# Patient Record
Sex: Male | Born: 1960 | Race: White | Hispanic: No | Marital: Married | Smoking: Never smoker
Health system: Southern US, Community
[De-identification: ages and names within clinical notes are randomized; demographics above are authoritative.]

## PROBLEM LIST (undated history)

## (undated) DIAGNOSIS — D509 Iron deficiency anemia, unspecified: Secondary | ICD-10-CM

## (undated) DIAGNOSIS — R002 Palpitations: Secondary | ICD-10-CM

## (undated) DIAGNOSIS — C61 Malignant neoplasm of prostate: Secondary | ICD-10-CM

## (undated) DIAGNOSIS — Z8489 Family history of other specified conditions: Secondary | ICD-10-CM

## (undated) DIAGNOSIS — N200 Calculus of kidney: Secondary | ICD-10-CM

## (undated) DIAGNOSIS — E119 Type 2 diabetes mellitus without complications: Secondary | ICD-10-CM

## (undated) DIAGNOSIS — A938 Other specified arthropod-borne viral fevers: Secondary | ICD-10-CM

## (undated) DIAGNOSIS — R350 Frequency of micturition: Secondary | ICD-10-CM

## (undated) DIAGNOSIS — J3089 Other allergic rhinitis: Secondary | ICD-10-CM

## (undated) DIAGNOSIS — H9190 Unspecified hearing loss, unspecified ear: Secondary | ICD-10-CM

## (undated) DIAGNOSIS — G4730 Sleep apnea, unspecified: Secondary | ICD-10-CM

## (undated) DIAGNOSIS — N400 Enlarged prostate without lower urinary tract symptoms: Secondary | ICD-10-CM

## (undated) DIAGNOSIS — M79604 Pain in right leg: Secondary | ICD-10-CM

## (undated) DIAGNOSIS — M51369 Other intervertebral disc degeneration, lumbar region without mention of lumbar back pain or lower extremity pain: Secondary | ICD-10-CM

## (undated) DIAGNOSIS — K219 Gastro-esophageal reflux disease without esophagitis: Secondary | ICD-10-CM

## (undated) DIAGNOSIS — G4733 Obstructive sleep apnea (adult) (pediatric): Secondary | ICD-10-CM

## (undated) DIAGNOSIS — F419 Anxiety disorder, unspecified: Secondary | ICD-10-CM

## (undated) DIAGNOSIS — K429 Umbilical hernia without obstruction or gangrene: Secondary | ICD-10-CM

## (undated) DIAGNOSIS — I499 Cardiac arrhythmia, unspecified: Secondary | ICD-10-CM

## (undated) DIAGNOSIS — M48062 Spinal stenosis, lumbar region with neurogenic claudication: Secondary | ICD-10-CM

## (undated) DIAGNOSIS — M542 Cervicalgia: Secondary | ICD-10-CM

## (undated) DIAGNOSIS — M5030 Other cervical disc degeneration, unspecified cervical region: Secondary | ICD-10-CM

## (undated) DIAGNOSIS — R0600 Dyspnea, unspecified: Secondary | ICD-10-CM

## (undated) DIAGNOSIS — K4090 Unilateral inguinal hernia, without obstruction or gangrene, not specified as recurrent: Secondary | ICD-10-CM

## (undated) DIAGNOSIS — Z87438 Personal history of other diseases of 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 organs: Secondary | ICD-10-CM

## (undated) DIAGNOSIS — M069 Rheumatoid arthritis, unspecified: Secondary | ICD-10-CM

## (undated) DIAGNOSIS — E669 Obesity, unspecified: Secondary | ICD-10-CM

## (undated) DIAGNOSIS — M1990 Unspecified osteoarthritis, unspecified site: Secondary | ICD-10-CM

## (undated) DIAGNOSIS — Z87442 Personal history of urinary calculi: Secondary | ICD-10-CM

## (undated) DIAGNOSIS — D569 Thalassemia, unspecified: Secondary | ICD-10-CM

## (undated) DIAGNOSIS — N475 Adhesions of prepuce and glans penis: Secondary | ICD-10-CM

## (undated) DIAGNOSIS — I10 Essential (primary) hypertension: Secondary | ICD-10-CM

## (undated) DIAGNOSIS — E785 Hyperlipidemia, unspecified: Secondary | ICD-10-CM

## (undated) DIAGNOSIS — G8929 Other chronic pain: Secondary | ICD-10-CM

## (undated) DIAGNOSIS — I2510 Atherosclerotic heart disease of native coronary artery without angina pectoris: Secondary | ICD-10-CM

## (undated) DIAGNOSIS — D649 Anemia, unspecified: Secondary | ICD-10-CM

## (undated) DIAGNOSIS — Z22322 Carrier or suspected carrier of Methicillin resistant Staphylococcus aureus: Secondary | ICD-10-CM

## (undated) DIAGNOSIS — J189 Pneumonia, unspecified organism: Secondary | ICD-10-CM

## (undated) DIAGNOSIS — F909 Attention-deficit hyperactivity disorder, unspecified type: Secondary | ICD-10-CM

## (undated) HISTORY — PX: ROTATOR CUFF REPAIR: SHX139

## (undated) HISTORY — PX: HYDROCELE EXCISION / REPAIR: SUR1145

## (undated) HISTORY — PX: LEG SURGERY: SHX1003

## (undated) HISTORY — PX: LITHOTRIPSY: SUR834

## (undated) HISTORY — DX: Calculus of kidney: N20.0

## (undated) HISTORY — PX: VASECTOMY: SHX75

## (undated) HISTORY — PX: BACK SURGERY: SHX140

## (undated) HISTORY — PX: TONSILLECTOMY: SUR1361

## (undated) HISTORY — PX: HERNIA REPAIR: SHX51

## (undated) HISTORY — PX: COLONOSCOPY: SHX174

## (undated) HISTORY — PX: INGUINAL HERNIA REPAIR: SUR1180

## (undated) HISTORY — DX: Thalassemia, unspecified: D56.9

## (undated) HISTORY — PX: OTHER SURGICAL HISTORY: SHX169

---

## 2005-01-04 ENCOUNTER — Ambulatory Visit: Payer: Self-pay | Admitting: Urology

## 2006-03-24 ENCOUNTER — Other Ambulatory Visit: Payer: Self-pay

## 2006-03-25 ENCOUNTER — Observation Stay: Payer: Self-pay | Admitting: Internal Medicine

## 2006-03-26 ENCOUNTER — Other Ambulatory Visit: Payer: Self-pay

## 2008-09-23 ENCOUNTER — Ambulatory Visit: Payer: Self-pay | Admitting: Family Medicine

## 2009-02-05 ENCOUNTER — Ambulatory Visit: Payer: Self-pay | Admitting: Family Medicine

## 2009-04-06 DIAGNOSIS — K4090 Unilateral inguinal hernia, without obstruction or gangrene, not specified as recurrent: Secondary | ICD-10-CM | POA: Insufficient documentation

## 2009-04-21 ENCOUNTER — Ambulatory Visit: Payer: Self-pay | Admitting: Surgery

## 2009-11-23 ENCOUNTER — Ambulatory Visit: Payer: Self-pay | Admitting: Urology

## 2010-07-14 ENCOUNTER — Ambulatory Visit: Payer: Self-pay | Admitting: Urology

## 2011-03-04 ENCOUNTER — Ambulatory Visit: Payer: Self-pay | Admitting: Urology

## 2011-03-09 ENCOUNTER — Ambulatory Visit: Payer: Self-pay | Admitting: Urology

## 2011-07-14 ENCOUNTER — Ambulatory Visit: Payer: Self-pay | Admitting: Specialist

## 2011-08-03 ENCOUNTER — Ambulatory Visit: Payer: Self-pay | Admitting: Specialist

## 2011-08-18 ENCOUNTER — Ambulatory Visit: Payer: Self-pay | Admitting: Specialist

## 2011-09-21 ENCOUNTER — Ambulatory Visit: Payer: Self-pay | Admitting: Urology

## 2011-09-22 LAB — PATHOLOGY REPORT

## 2011-10-04 ENCOUNTER — Encounter: Payer: Self-pay | Admitting: Specialist

## 2011-10-13 ENCOUNTER — Encounter: Payer: Self-pay | Admitting: Specialist

## 2011-11-12 ENCOUNTER — Encounter: Payer: Self-pay | Admitting: Specialist

## 2011-12-13 ENCOUNTER — Encounter: Payer: Self-pay | Admitting: Specialist

## 2012-01-13 ENCOUNTER — Encounter: Payer: Self-pay | Admitting: Specialist

## 2013-06-11 ENCOUNTER — Other Ambulatory Visit: Payer: Self-pay | Admitting: Specialist

## 2013-06-15 LAB — MISC AER/ANAEROBIC CULT.

## 2013-07-17 ENCOUNTER — Ambulatory Visit: Payer: Self-pay | Admitting: Family Medicine

## 2014-05-16 LAB — PSA: PSA: 3.2

## 2014-05-16 LAB — LIPID PANEL
Cholesterol: 134 mg/dL (ref 0–200)
HDL: 28 mg/dL — AB (ref 35–70)
LDL Cholesterol: 83 mg/dL
LDl/HDL Ratio: 3
Triglycerides: 114 mg/dL (ref 40–160)

## 2014-07-14 LAB — HEPATIC FUNCTION PANEL
ALT: 20 U/L (ref 10–40)
AST: 18 U/L (ref 14–40)
Alkaline Phosphatase: 66 U/L (ref 25–125)
Bilirubin, Total: 0.6 mg/dL

## 2014-07-14 LAB — BASIC METABOLIC PANEL
BUN: 13 mg/dL (ref 4–21)
Creatinine: 0.8 mg/dL (ref 0.6–1.3)
Glucose: 101 mg/dL
Sodium: 140 mmol/L (ref 137–147)

## 2014-07-14 LAB — TSH: TSH: 1.55 u[IU]/mL (ref 0.41–5.90)

## 2014-07-18 ENCOUNTER — Ambulatory Visit: Payer: Self-pay | Admitting: Family Medicine

## 2014-07-29 ENCOUNTER — Ambulatory Visit: Payer: Self-pay | Admitting: Family Medicine

## 2014-08-05 LAB — CBC AND DIFFERENTIAL
Neutrophils Absolute: 2 /uL
WBC: 3.9 10^3/mL

## 2014-08-11 ENCOUNTER — Emergency Department: Payer: Self-pay | Admitting: Emergency Medicine

## 2014-08-11 LAB — COMPREHENSIVE METABOLIC PANEL
Albumin: 4 g/dL (ref 3.4–5.0)
Alkaline Phosphatase: 55 U/L
Anion Gap: 6 — ABNORMAL LOW (ref 7–16)
BUN: 11 mg/dL (ref 7–18)
Bilirubin,Total: 0.6 mg/dL (ref 0.2–1.0)
Calcium, Total: 8.5 mg/dL (ref 8.5–10.1)
Chloride: 104 mmol/L (ref 98–107)
Co2: 28 mmol/L (ref 21–32)
Creatinine: 1.01 mg/dL (ref 0.60–1.30)
EGFR (African American): >60
EGFR (Non-African Amer.): >60
Glucose: 104 mg/dL — ABNORMAL HIGH (ref 65–99)
Osmolality: 275 (ref 275–301)
Potassium: 4.1 mmol/L (ref 3.5–5.1)
SGOT(AST): 16 U/L (ref 15–37)
SGPT (ALT): 28 U/L
Sodium: 138 mmol/L (ref 136–145)
Total Protein: 7.7 g/dL (ref 6.4–8.2)

## 2014-08-11 LAB — CSF CELL COUNT WITH DIFFERENTIAL
CSF Tube #: 2
Eosinophil: 7 %
Lymphocytes: 20 %
Monocytes/Macrophages: 13 %
Neutrophils: 60 %
Other Cells: 0 %
RBC (CSF): 2275 /mm3
WBC (CSF): 8 /mm3

## 2014-08-11 LAB — CSF CELL CT + PROT + GLU PANEL
CSF Tube #: 1
Eosinophil: 0 %
Glucose, CSF: 60 mg/dL (ref 40–75)
Lymphocytes: 15 %
Monocytes/Macrophages: 13 %
Neutrophils: 72 %
Other Cells: 0 %
Protein, CSF: 70 mg/dL — ABNORMAL HIGH (ref 15–45)
RBC (CSF): 11222 /mm3
WBC (CSF): 10 /mm3

## 2014-08-11 LAB — CBC
HCT: 35.7 % — ABNORMAL LOW (ref 40.0–52.0)
HGB: 11.4 g/dL — ABNORMAL LOW (ref 13.0–18.0)
MCH: 19.4 pg — ABNORMAL LOW (ref 26.0–34.0)
MCHC: 32 g/dL (ref 32.0–36.0)
MCV: 61 fL — ABNORMAL LOW (ref 80–100)
Platelet: 141 10*3/uL — ABNORMAL LOW (ref 150–440)
RBC: 5.89 10*6/uL (ref 4.40–5.90)
RDW: 16.3 % — ABNORMAL HIGH (ref 11.5–14.5)
WBC: 4.2 10*3/uL (ref 3.8–10.6)

## 2014-08-11 LAB — URINALYSIS, COMPLETE
Bacteria: NONE SEEN
Bilirubin,UR: NEGATIVE
Blood: NEGATIVE
Glucose,UR: NEGATIVE mg/dL (ref 0–75)
Ketone: NEGATIVE
Leukocyte Esterase: NEGATIVE
Nitrite: NEGATIVE
Ph: 7 (ref 4.5–8.0)
Protein: NEGATIVE
RBC,UR: <1 /HPF (ref 0–5)
Specific Gravity: 1.006 (ref 1.003–1.030)
Squamous Epithelial: NONE SEEN
WBC UR: NONE SEEN /HPF (ref 0–5)

## 2014-08-11 LAB — DIFFERENTIAL
Basophil #: 0 10*3/uL (ref 0.0–0.1)
Basophil %: 0.8 %
Eosinophil #: 0.2 10*3/uL (ref 0.0–0.7)
Eosinophil %: 4.5 %
Lymphocyte #: 1.3 10*3/uL (ref 1.0–3.6)
Lymphocyte %: 31.1 %
Monocyte #: 0.5 x10 3/mm (ref 0.2–1.0)
Monocyte %: 10.8 %
Neutrophil #: 2.2 10*3/uL (ref 1.4–6.5)
Neutrophil %: 52.8 %

## 2014-08-11 LAB — PROTIME-INR
INR: 1
Prothrombin Time: 12.8 secs (ref 11.5–14.7)

## 2014-08-11 LAB — TROPONIN I: Troponin-I: <0.02 ng/mL

## 2014-08-14 LAB — CSF CULTURE W GRAM STAIN

## 2014-09-01 LAB — CULTURE, FUNGUS WITHOUT SMEAR

## 2014-09-09 ENCOUNTER — Ambulatory Visit: Payer: Self-pay | Admitting: Family Medicine

## 2014-09-17 ENCOUNTER — Ambulatory Visit: Payer: Self-pay | Admitting: Family Medicine

## 2014-10-09 ENCOUNTER — Other Ambulatory Visit (HOSPITAL_COMMUNITY): Payer: Self-pay | Admitting: Neurosurgery

## 2014-11-04 ENCOUNTER — Encounter (HOSPITAL_COMMUNITY)
Admission: RE | Admit: 2014-11-04 | Discharge: 2014-11-04 | Disposition: A | Discharge status: Home / Self Care | Payer: BC Managed Care – PPO | Source: Ambulatory Visit | Attending: Neurosurgery | Admitting: Neurosurgery

## 2014-11-04 ENCOUNTER — Encounter (HOSPITAL_COMMUNITY): Payer: Self-pay

## 2014-11-04 DIAGNOSIS — Z01818 Encounter for other preprocedural examination: Secondary | ICD-10-CM | POA: Diagnosis present

## 2014-11-04 DIAGNOSIS — I10 Essential (primary) hypertension: Secondary | ICD-10-CM | POA: Diagnosis not present

## 2014-11-04 HISTORY — DX: Cardiac arrhythmia, unspecified: I49.9

## 2014-11-04 HISTORY — DX: Essential (primary) hypertension: I10

## 2014-11-04 HISTORY — DX: Cervicalgia: M54.2

## 2014-11-04 HISTORY — DX: Other allergic rhinitis: J30.89

## 2014-11-04 HISTORY — DX: Other chronic pain: G89.29

## 2014-11-04 HISTORY — DX: Personal history of urinary calculi: Z87.442

## 2014-11-04 HISTORY — DX: Pain in right leg: M79.604

## 2014-11-04 HISTORY — DX: Unspecified hearing loss, unspecified ear: H91.90

## 2014-11-04 HISTORY — DX: Frequency of micturition: R35.0

## 2014-11-04 HISTORY — DX: Other specified arthropod-borne viral fevers: A93.8

## 2014-11-04 HISTORY — DX: Benign prostatic hyperplasia without lower urinary tract symptoms: N40.0

## 2014-11-04 LAB — CBC
HCT: 36.8 % — ABNORMAL LOW (ref 39.0–52.0)
Hemoglobin: 11.6 g/dL — ABNORMAL LOW (ref 13.0–17.0)
MCH: 19.7 pg — ABNORMAL LOW (ref 26.0–34.0)
MCHC: 31.5 g/dL (ref 30.0–36.0)
MCV: 62.6 fL — ABNORMAL LOW (ref 78.0–100.0)
Platelets: 165 10*3/uL (ref 150–400)
RBC: 5.88 MIL/uL — ABNORMAL HIGH (ref 4.22–5.81)
RDW: 16.5 % — ABNORMAL HIGH (ref 11.5–15.5)
WBC: 4.1 10*3/uL (ref 4.0–10.5)

## 2014-11-04 LAB — BASIC METABOLIC PANEL
Anion gap: 11 (ref 5–15)
BUN: 12 mg/dL (ref 6–23)
CO2: 26 mEq/L (ref 19–32)
Calcium: 9 mg/dL (ref 8.4–10.5)
Chloride: 103 mEq/L (ref 96–112)
Creatinine, Ser: 0.81 mg/dL (ref 0.50–1.35)
GFR calc Af Amer: >90 mL/min (ref >90)
GFR calc non Af Amer: >90 mL/min (ref >90)
Glucose, Bld: 120 mg/dL — ABNORMAL HIGH (ref 70–99)
Potassium: 4.7 mEq/L (ref 3.7–5.3)
Sodium: 140 mEq/L (ref 137–147)

## 2014-11-04 LAB — SURGICAL PCR SCREEN
MRSA, PCR: NEGATIVE
Staphylococcus aureus: POSITIVE — AB

## 2014-11-04 NOTE — Pre-Procedure Instructions (Signed)
Justin Soto  11/04/2014   Your procedure is scheduled on:  Thursday November 13, 2014 at 11:10 AM.  Report to [facility] Admitting at 8:15 AM.  Call this number if you have problems the morning of surgery: [phone]   Call this number if you have any questions prior to surgery: [phone]   Remember:   Do not eat food or drink liquids after midnight.   Take these medicines the morning of surgery with A SIP OF WATER: Amlodipine (Lotrel), Cetirizine (Zyrtec), Montelukast (Singlulair), Afrin nasal spray if needed, Pantoprazole (Protonix), Pregabalin (Lyrica) and Silodosin (Rapaflo)   Stop taking any aspirin or herbal medications 7 days before your surgery 11/06/14 (Ex. Multivitamin, Naproxen, Advil, Motrin, etc)   Do not wear jewelry.  Do not wear lotions, powders, or cologne.   Men may shave face and neck.  Do not bring valuables to the hospital.  [facility] is not responsible for any belongings or valuables.               Contacts, dentures or bridgework may not be worn into surgery.  Leave suitcase in the car. After surgery it may be brought to your room.  For patients admitted to the hospital, discharge time is determined by your teatment team.               Patients discharged the day of surgery will not be allowed to drive home.  Name and phone number of your driver:   Special Instructions: Shower using CHG soap the night before and the morning of your surgery   Please read over the following fact sheets that you were given: Pain Booklet, Coughing and Deep Breathing, MRSA Information and Surgical Site Infection Prevention

## 2014-11-04 NOTE — Progress Notes (Signed)
Nurse called Mupirocin ointment in to [facility] and Nurse called and informed patient of positive PCR results.

## 2014-11-12 MED ORDER — CEFAZOLIN SODIUM-DEXTROSE 2-3 GM-% IV SOLR
2.0000 g | INTRAVENOUS | Status: AC
  Administered 2014-11-13: 2 g via INTRAVENOUS
  Filled 2014-11-12: qty 50

## 2014-11-13 ENCOUNTER — Ambulatory Visit (HOSPITAL_COMMUNITY): Payer: BC Managed Care – PPO | Admitting: Anesthesiology

## 2014-11-13 ENCOUNTER — Ambulatory Visit (HOSPITAL_COMMUNITY): Payer: BC Managed Care – PPO

## 2014-11-13 ENCOUNTER — Observation Stay (HOSPITAL_COMMUNITY)
Admission: RE | Admit: 2014-11-13 | Discharge: 2014-11-14 | Disposition: A | Discharge status: Home / Self Care | Payer: BC Managed Care – PPO | Source: Ambulatory Visit | Attending: Neurosurgery | Admitting: Neurosurgery

## 2014-11-13 ENCOUNTER — Encounter (HOSPITAL_COMMUNITY)
Admission: RE | Disposition: A | Discharge status: Home / Self Care | Payer: Self-pay | Source: Ambulatory Visit | Attending: Neurosurgery

## 2014-11-13 DIAGNOSIS — N400 Enlarged prostate without lower urinary tract symptoms: Secondary | ICD-10-CM | POA: Diagnosis not present

## 2014-11-13 DIAGNOSIS — I498 Other specified cardiac arrhythmias: Secondary | ICD-10-CM | POA: Diagnosis not present

## 2014-11-13 DIAGNOSIS — M4802 Spinal stenosis, cervical region: Secondary | ICD-10-CM | POA: Insufficient documentation

## 2014-11-13 DIAGNOSIS — Z7982 Long term (current) use of aspirin: Secondary | ICD-10-CM | POA: Insufficient documentation

## 2014-11-13 DIAGNOSIS — I10 Essential (primary) hypertension: Secondary | ICD-10-CM | POA: Insufficient documentation

## 2014-11-13 DIAGNOSIS — M5002 Cervical disc disorder with myelopathy, mid-cervical region: Principal | ICD-10-CM | POA: Insufficient documentation

## 2014-11-13 DIAGNOSIS — Z419 Encounter for procedure for purposes other than remedying health state, unspecified: Secondary | ICD-10-CM

## 2014-11-13 DIAGNOSIS — H918X3 Other specified hearing loss, bilateral: Secondary | ICD-10-CM | POA: Diagnosis not present

## 2014-11-13 DIAGNOSIS — M4722 Other spondylosis with radiculopathy, cervical region: Secondary | ICD-10-CM

## 2014-11-13 DIAGNOSIS — M4712 Other spondylosis with myelopathy, cervical region: Secondary | ICD-10-CM | POA: Diagnosis present

## 2014-11-13 HISTORY — PX: ANTERIOR CERVICAL DECOMP/DISCECTOMY FUSION: SHX1161

## 2014-11-13 SURGERY — ANTERIOR CERVICAL DECOMPRESSION/DISCECTOMY FUSION 2 LEVELS
Anesthesia: General | Site: Neck

## 2014-11-13 MED ORDER — HYDROMORPHONE HCL 1 MG/ML IJ SOLN
INTRAMUSCULAR | Status: AC
  Filled 2014-11-13: qty 1

## 2014-11-13 MED ORDER — DEXAMETHASONE SODIUM PHOSPHATE 4 MG/ML IJ SOLN
INTRAMUSCULAR | Status: AC
  Filled 2014-11-13: qty 6

## 2014-11-13 MED ORDER — FENTANYL CITRATE 0.05 MG/ML IJ SOLN
INTRAMUSCULAR | Status: AC
  Filled 2014-11-13: qty 5

## 2014-11-13 MED ORDER — ONDANSETRON HCL 4 MG/2ML IJ SOLN
INTRAMUSCULAR | Status: DC | PRN
  Administered 2014-11-13: 4 mg via INTRAVENOUS

## 2014-11-13 MED ORDER — LACTATED RINGERS IV SOLN
INTRAVENOUS | Status: DC
  Administered 2014-11-13: 09:00:00 via INTRAVENOUS

## 2014-11-13 MED ORDER — PHENOL 1.4 % MT LIQD
1.0000 | OROMUCOSAL | Status: DC | PRN
  Administered 2014-11-13: 1 via OROMUCOSAL
  Filled 2014-11-13: qty 177

## 2014-11-13 MED ORDER — PHENYLEPHRINE HCL 10 MG/ML IJ SOLN
10.0000 mg | INTRAVENOUS | Status: DC | PRN
  Administered 2014-11-13: 40 ug/min via INTRAVENOUS

## 2014-11-13 MED ORDER — PREGABALIN 100 MG PO CAPS
300.0000 mg | ORAL_CAPSULE | Freq: Two times a day (BID) | ORAL | Status: DC
  Administered 2014-11-13: 300 mg via ORAL
  Filled 2014-11-13: qty 3

## 2014-11-13 MED ORDER — BACITRACIN 50000 UNITS IM SOLR
INTRAMUSCULAR | Status: DC | PRN
  Administered 2014-11-13: 13:00:00

## 2014-11-13 MED ORDER — CEFAZOLIN SODIUM-DEXTROSE 2-3 GM-% IV SOLR
2.0000 g | Freq: Three times a day (TID) | INTRAVENOUS | Status: AC
  Administered 2014-11-13 – 2014-11-14 (×2): 2 g via INTRAVENOUS
  Filled 2014-11-13 (×2): qty 50

## 2014-11-13 MED ORDER — DEXAMETHASONE SODIUM PHOSPHATE 4 MG/ML IJ SOLN: 4.0000 mg | Freq: Four times a day (QID) | INTRAMUSCULAR | Status: AC

## 2014-11-13 MED ORDER — DEXAMETHASONE SODIUM PHOSPHATE 4 MG/ML IJ SOLN
INTRAMUSCULAR | Status: DC | PRN
  Administered 2014-11-13: 10 mg via INTRAVENOUS

## 2014-11-13 MED ORDER — EPHEDRINE SULFATE 50 MG/ML IJ SOLN
INTRAMUSCULAR | Status: DC | PRN
  Administered 2014-11-13: 10 mg via INTRAVENOUS
  Administered 2014-11-13: 20 mg via INTRAVENOUS
  Administered 2014-11-13: 10 mg via INTRAVENOUS

## 2014-11-13 MED ORDER — ROCURONIUM BROMIDE 100 MG/10ML IV SOLN
INTRAVENOUS | Status: DC | PRN
  Administered 2014-11-13 (×2): 10 mg via INTRAVENOUS
  Administered 2014-11-13: 50 mg via INTRAVENOUS
  Administered 2014-11-13: 10 mg via INTRAVENOUS

## 2014-11-13 MED ORDER — OXYCODONE HCL 5 MG/5ML PO SOLN: 5.0000 mg | Freq: Once | ORAL | Status: DC | PRN

## 2014-11-13 MED ORDER — MORPHINE SULFATE 2 MG/ML IJ SOLN
1.0000 mg | INTRAMUSCULAR | Status: DC | PRN
  Administered 2014-11-13: 4 mg via INTRAVENOUS
  Filled 2014-11-13: qty 2

## 2014-11-13 MED ORDER — TAMSULOSIN HCL 0.4 MG PO CAPS
0.4000 mg | ORAL_CAPSULE | Freq: Every day | ORAL | Status: DC
  Filled 2014-11-13 (×2): qty 1

## 2014-11-13 MED ORDER — PROPOFOL 10 MG/ML IV BOLUS
INTRAVENOUS | Status: AC
  Filled 2014-11-13: qty 20

## 2014-11-13 MED ORDER — GLYCOPYRROLATE 0.2 MG/ML IJ SOLN
INTRAMUSCULAR | Status: DC | PRN
  Administered 2014-11-13: 0.6 mg via INTRAVENOUS

## 2014-11-13 MED ORDER — HYDROCODONE-ACETAMINOPHEN 5-325 MG PO TABS: 1.0000 | ORAL_TABLET | ORAL | Status: DC | PRN

## 2014-11-13 MED ORDER — OXYCODONE-ACETAMINOPHEN 5-325 MG PO TABS
1.0000 | ORAL_TABLET | ORAL | Status: DC | PRN
  Administered 2014-11-13 – 2014-11-14 (×3): 2 via ORAL
  Filled 2014-11-13 (×3): qty 2

## 2014-11-13 MED ORDER — LACTATED RINGERS IV SOLN: INTRAVENOUS | Status: DC

## 2014-11-13 MED ORDER — ACETAMINOPHEN 325 MG PO TABS: 650.0000 mg | ORAL_TABLET | ORAL | Status: DC | PRN

## 2014-11-13 MED ORDER — FENTANYL CITRATE 0.05 MG/ML IJ SOLN
INTRAMUSCULAR | Status: DC | PRN
  Administered 2014-11-13: 50 ug via INTRAVENOUS
  Administered 2014-11-13: 100 ug via INTRAVENOUS
  Administered 2014-11-13: 50 ug via INTRAVENOUS

## 2014-11-13 MED ORDER — DIPHENHYDRAMINE HCL 25 MG PO CAPS
25.0000 mg | ORAL_CAPSULE | ORAL | Status: DC | PRN
  Administered 2014-11-13: 25 mg via ORAL
  Filled 2014-11-13: qty 1

## 2014-11-13 MED ORDER — DIAZEPAM 5 MG PO TABS
5.0000 mg | ORAL_TABLET | Freq: Four times a day (QID) | ORAL | Status: DC | PRN
  Administered 2014-11-13: 5 mg via ORAL
  Filled 2014-11-13: qty 1

## 2014-11-13 MED ORDER — ALUM & MAG HYDROXIDE-SIMETH 200-200-20 MG/5ML PO SUSP: 30.0000 mL | Freq: Four times a day (QID) | ORAL | Status: DC | PRN

## 2014-11-13 MED ORDER — HEMOSTATIC AGENTS (NO CHARGE) OPTIME
TOPICAL | Status: DC | PRN
  Administered 2014-11-13: 1 via TOPICAL

## 2014-11-13 MED ORDER — BENAZEPRIL HCL 20 MG PO TABS
20.0000 mg | ORAL_TABLET | Freq: Every day | ORAL | Status: DC
  Filled 2014-11-13 (×2): qty 1

## 2014-11-13 MED ORDER — MENTHOL 3 MG MT LOZG: 1.0000 | LOZENGE | OROMUCOSAL | Status: DC | PRN

## 2014-11-13 MED ORDER — LACTATED RINGERS IV SOLN
INTRAVENOUS | Status: DC | PRN
  Administered 2014-11-13 (×3): via INTRAVENOUS

## 2014-11-13 MED ORDER — BACITRACIN ZINC 500 UNIT/GM EX OINT
TOPICAL_OINTMENT | CUTANEOUS | Status: DC | PRN
  Administered 2014-11-13: 1 via TOPICAL

## 2014-11-13 MED ORDER — ROCURONIUM BROMIDE 50 MG/5ML IV SOLN
INTRAVENOUS | Status: AC
  Filled 2014-11-13: qty 1

## 2014-11-13 MED ORDER — ONDANSETRON HCL 4 MG/2ML IJ SOLN: 4.0000 mg | INTRAMUSCULAR | Status: DC | PRN

## 2014-11-13 MED ORDER — DOCUSATE SODIUM 100 MG PO CAPS
100.0000 mg | ORAL_CAPSULE | Freq: Two times a day (BID) | ORAL | Status: DC
  Administered 2014-11-13: 100 mg via ORAL
  Filled 2014-11-13 (×3): qty 1

## 2014-11-13 MED ORDER — THROMBIN 5000 UNITS EX SOLR
CUTANEOUS | Status: DC | PRN
  Administered 2014-11-13 (×2): 5000 [IU] via TOPICAL

## 2014-11-13 MED ORDER — ONDANSETRON HCL 4 MG/2ML IJ SOLN
INTRAMUSCULAR | Status: AC
  Filled 2014-11-13: qty 2

## 2014-11-13 MED ORDER — LORATADINE 10 MG PO TABS
10.0000 mg | ORAL_TABLET | Freq: Every day | ORAL | Status: DC
  Filled 2014-11-13 (×2): qty 1

## 2014-11-13 MED ORDER — SODIUM CHLORIDE 0.9 % IJ SOLN
INTRAMUSCULAR | Status: AC
  Filled 2014-11-13: qty 10

## 2014-11-13 MED ORDER — MIDAZOLAM HCL 5 MG/5ML IJ SOLN
INTRAMUSCULAR | Status: DC | PRN
  Administered 2014-11-13: 2 mg via INTRAVENOUS

## 2014-11-13 MED ORDER — OXYCODONE HCL 5 MG PO TABS: 5.0000 mg | ORAL_TABLET | Freq: Once | ORAL | Status: DC | PRN

## 2014-11-13 MED ORDER — HYDROMORPHONE HCL 1 MG/ML IJ SOLN
0.2500 mg | INTRAMUSCULAR | Status: DC | PRN
  Administered 2014-11-13 (×4): 0.5 mg via INTRAVENOUS

## 2014-11-13 MED ORDER — ONDANSETRON HCL 4 MG/2ML IJ SOLN: 4.0000 mg | Freq: Four times a day (QID) | INTRAMUSCULAR | Status: DC | PRN

## 2014-11-13 MED ORDER — LIDOCAINE HCL (CARDIAC) 20 MG/ML IV SOLN
INTRAVENOUS | Status: AC
  Filled 2014-11-13: qty 5

## 2014-11-13 MED ORDER — EPHEDRINE SULFATE 50 MG/ML IJ SOLN
INTRAMUSCULAR | Status: AC
  Filled 2014-11-13: qty 1

## 2014-11-13 MED ORDER — PANTOPRAZOLE SODIUM 40 MG PO TBEC
40.0000 mg | DELAYED_RELEASE_TABLET | Freq: Two times a day (BID) | ORAL | Status: DC
  Administered 2014-11-13: 40 mg via ORAL
  Filled 2014-11-13: qty 1

## 2014-11-13 MED ORDER — ACETAMINOPHEN 650 MG RE SUPP: 650.0000 mg | RECTAL | Status: DC | PRN

## 2014-11-13 MED ORDER — MONTELUKAST SODIUM 10 MG PO TABS
10.0000 mg | ORAL_TABLET | Freq: Every day | ORAL | Status: DC
  Filled 2014-11-13 (×2): qty 1

## 2014-11-13 MED ORDER — AMLODIPINE BESY-BENAZEPRIL HCL 10-20 MG PO CAPS: 1.0000 | ORAL_CAPSULE | Freq: Every day | ORAL | Status: DC

## 2014-11-13 MED ORDER — AMLODIPINE BESYLATE 10 MG PO TABS
10.0000 mg | ORAL_TABLET | Freq: Every day | ORAL | Status: DC
  Filled 2014-11-13 (×2): qty 1

## 2014-11-13 MED ORDER — LIDOCAINE HCL (CARDIAC) 20 MG/ML IV SOLN
INTRAVENOUS | Status: DC | PRN
  Administered 2014-11-13: 60 mg via INTRAVENOUS

## 2014-11-13 MED ORDER — MIDAZOLAM HCL 2 MG/2ML IJ SOLN
INTRAMUSCULAR | Status: AC
  Filled 2014-11-13: qty 2

## 2014-11-13 MED ORDER — DEXAMETHASONE 4 MG PO TABS
4.0000 mg | ORAL_TABLET | Freq: Four times a day (QID) | ORAL | Status: AC
  Administered 2014-11-13 – 2014-11-14 (×3): 4 mg via ORAL
  Filled 2014-11-13 (×3): qty 1

## 2014-11-13 MED ORDER — OXYMETAZOLINE HCL 0.05 % NA SOLN
2.0000 | Freq: Every evening | NASAL | Status: DC | PRN
  Filled 2014-11-13: qty 15

## 2014-11-13 MED ORDER — ADULT MULTIVITAMIN W/MINERALS CH
1.0000 | ORAL_TABLET | Freq: Every day | ORAL | Status: DC
  Filled 2014-11-13: qty 1

## 2014-11-13 MED ORDER — BUPIVACAINE-EPINEPHRINE (PF) 0.5% -1:200000 IJ SOLN
INTRAMUSCULAR | Status: DC | PRN
  Administered 2014-11-13: 10 mL

## 2014-11-13 MED ORDER — NEOSTIGMINE METHYLSULFATE 10 MG/10ML IV SOLN
INTRAVENOUS | Status: DC | PRN
  Administered 2014-11-13: 4 mg via INTRAVENOUS

## 2014-11-13 MED ORDER — PROPOFOL 10 MG/ML IV BOLUS
INTRAVENOUS | Status: DC | PRN
  Administered 2014-11-13: 150 mg via INTRAVENOUS

## 2014-11-13 SURGICAL SUPPLY — 68 items
APL SKNCLS STERI-STRIP NONHPOA (GAUZE/BANDAGES/DRESSINGS) ×1
BAG DECANTER FOR FLEXI CONT (MISCELLANEOUS) ×2 IMPLANT
BENZOIN TINCTURE PRP APPL 2/3 (GAUZE/BANDAGES/DRESSINGS) ×3 IMPLANT
BIT DRILL NEURO 2X3.1 SFT TUCH (MISCELLANEOUS) ×1 IMPLANT
BLADE SURG 15 STRL LF DISP TIS (BLADE) ×1 IMPLANT
BLADE SURG 15 STRL SS (BLADE) ×2
BRUSH SCRUB EZ PLAIN DRY (MISCELLANEOUS) ×2 IMPLANT
BUR BARREL STRAIGHT FLUTE 4.0 (BURR) ×2 IMPLANT
BUR MATCHSTICK NEURO 3.0 LAGG (BURR) ×2 IMPLANT
CANISTER SUCT 3000ML (MISCELLANEOUS) ×2 IMPLANT
CLIP TI MEDIUM 6 (CLIP) ×1 IMPLANT
CONT SPEC 4OZ CLIKSEAL STRL BL (MISCELLANEOUS) ×2 IMPLANT
COVER MAYO STAND STRL (DRAPES) ×2 IMPLANT
DRAPE LAPAROTOMY 100X72 PEDS (DRAPES) ×2 IMPLANT
DRAPE MICROSCOPE LEICA (MISCELLANEOUS) IMPLANT
DRAPE POUCH INSTRU U-SHP 10X18 (DRAPES) ×2 IMPLANT
DRAPE SURG 17X23 STRL (DRAPES) ×4 IMPLANT
DRILL NEURO 2X3.1 SOFT TOUCH (MISCELLANEOUS) ×2
ELECT BLADE 4.0 EZ CLEAN MEGAD (MISCELLANEOUS) ×2
ELECT REM PT RETURN 9FT ADLT (ELECTROSURGICAL) ×2
ELECTRODE BLDE 4.0 EZ CLN MEGD (MISCELLANEOUS) IMPLANT
ELECTRODE REM PT RTRN 9FT ADLT (ELECTROSURGICAL) ×1 IMPLANT
GAUZE SPONGE 4X4 12PLY STRL (GAUZE/BANDAGES/DRESSINGS) ×2 IMPLANT
GAUZE SPONGE 4X4 16PLY XRAY LF (GAUZE/BANDAGES/DRESSINGS) IMPLANT
GLOVE BIO SURGEON STRL SZ8.5 (GLOVE) ×2 IMPLANT
GLOVE BIOGEL PI IND STRL 7.5 (GLOVE) IMPLANT
GLOVE BIOGEL PI IND STRL 8.5 (GLOVE) IMPLANT
GLOVE BIOGEL PI INDICATOR 7.5 (GLOVE) ×3
GLOVE BIOGEL PI INDICATOR 8.5 (GLOVE) ×1
GLOVE ECLIPSE 8.5 STRL (GLOVE) ×1 IMPLANT
GLOVE EXAM NITRILE LRG STRL (GLOVE) IMPLANT
GLOVE EXAM NITRILE MD LF STRL (GLOVE) IMPLANT
GLOVE EXAM NITRILE XL STR (GLOVE) IMPLANT
GLOVE EXAM NITRILE XS STR PU (GLOVE) IMPLANT
GLOVE SS BIOGEL STRL SZ 8 (GLOVE) ×1 IMPLANT
GLOVE SUPERSENSE BIOGEL SZ 8 (GLOVE) ×2
GLOVE SURG SS PI 7.0 STRL IVOR (GLOVE) ×1 IMPLANT
GOWN STRL REUS W/ TWL LRG LVL3 (GOWN DISPOSABLE) IMPLANT
GOWN STRL REUS W/ TWL XL LVL3 (GOWN DISPOSABLE) IMPLANT
GOWN STRL REUS W/TWL 2XL LVL3 (GOWN DISPOSABLE) ×1 IMPLANT
GOWN STRL REUS W/TWL LRG LVL3 (GOWN DISPOSABLE) ×4
GOWN STRL REUS W/TWL XL LVL3 (GOWN DISPOSABLE) ×2
KIT BASIN OR (CUSTOM PROCEDURE TRAY) ×2 IMPLANT
KIT ROOM TURNOVER OR (KITS) ×2 IMPLANT
MARKER SKIN DUAL TIP RULER LAB (MISCELLANEOUS) ×2 IMPLANT
NDL SPNL 18GX3.5 QUINCKE PK (NEEDLE) ×1 IMPLANT
NEEDLE HYPO 22GX1.5 SAFETY (NEEDLE) ×2 IMPLANT
NEEDLE SPNL 18GX3.5 QUINCKE PK (NEEDLE) ×2 IMPLANT
NS IRRIG 1000ML POUR BTL (IV SOLUTION) ×2 IMPLANT
PACK LAMINECTOMY NEURO (CUSTOM PROCEDURE TRAY) ×2 IMPLANT
PEEK VISTA 14X14X7MM (Peek) ×2 IMPLANT
PIN DISTRACTION 14MM (PIN) ×4 IMPLANT
PLATE ANT CERV XTEND 1 LV 14 (Plate) ×2 IMPLANT
PUTTY KINEX BIOACTIVE 5CC (Bone Implant) ×1 IMPLANT
RUBBERBAND STERILE (MISCELLANEOUS) IMPLANT
SCREW XTD VAR 4.2 SELF TAP (Screw) ×8 IMPLANT
SPONGE INTESTINAL PEANUT (DISPOSABLE) ×5 IMPLANT
SPONGE SURGIFOAM ABS GEL SZ50 (HEMOSTASIS) ×2 IMPLANT
STRIP CLOSURE SKIN 1/2X4 (GAUZE/BANDAGES/DRESSINGS) ×2 IMPLANT
SUT VIC AB 0 CT1 27 (SUTURE) ×2
SUT VIC AB 0 CT1 27XBRD ANTBC (SUTURE) ×1 IMPLANT
SUT VIC AB 3-0 SH 8-18 (SUTURE) ×2 IMPLANT
SYR 20ML ECCENTRIC (SYRINGE) ×2 IMPLANT
SYR BULB 3OZ (MISCELLANEOUS) ×1 IMPLANT
TAPE CLOTH SURG 4X10 WHT LF (GAUZE/BANDAGES/DRESSINGS) ×1 IMPLANT
TOWEL OR 17X24 6PK STRL BLUE (TOWEL DISPOSABLE) ×2 IMPLANT
TOWEL OR 17X26 10 PK STRL BLUE (TOWEL DISPOSABLE) ×2 IMPLANT
WATER STERILE IRR 1000ML POUR (IV SOLUTION) ×2 IMPLANT

## 2014-11-13 NOTE — Transfer of Care (Signed)
Immediate Anesthesia Transfer of Care Note  Patient: SHAHMEER BUNN  Procedure(s) Performed: Procedure(s) with comments: Cervical three-four, Cervical six-seven anterior cervical decompression with fusion interbody prosthesis plating and bonegraft (N/A) - Cervical three-four, Cervical six-seven anterior cervical decompression with fusion interbody prosthesis plating and bonegraft  Patient Location: PACU  Anesthesia Type:General  Level of Consciousness: awake, alert , oriented and patient cooperative  Airway & Oxygen Therapy: Patient Spontanous Breathing and Patient connected to nasal cannula oxygen  Post-op Assessment: Report given to PACU RN, Post -op Vital signs reviewed and stable, Patient moving all extremities X 4 and Patient able to stick tongue midline  Post vital signs: Reviewed and stable  Complications: No apparent anesthesia complications

## 2014-11-13 NOTE — Anesthesia Procedure Notes (Signed)
Procedure Name: Intubation Date/Time: 11/13/2014 11:17 AM Performed by: Carney Living Pre-anesthesia Checklist: Patient identified, Emergency Drugs available, Suction available, Patient being monitored and Timeout performed Patient Re-evaluated:Patient Re-evaluated prior to inductionOxygen Delivery Method: Circle system utilized Preoxygenation: Pre-oxygenation with 100% oxygen Intubation Type: IV induction Ventilation: Mask ventilation without difficulty and Oral airway inserted - appropriate to patient size Tube type: Oral Tube size: 7.5 mm Number of attempts: 1 Airway Equipment and Method: Video-laryngoscopy Placement Confirmation: ETT inserted through vocal cords under direct vision,  positive ETCO2 and breath sounds checked- equal and bilateral Secured at: 22 cm Tube secured with: Tape Dental Injury: Teeth and Oropharynx as per pre-operative assessment  Difficulty Due To: Difficulty was anticipated and Difficult Airway- due to reduced neck mobility Comments: Pt easily intubated utilizing the Glidescope, BBS=, +ETC02, VSS

## 2014-11-13 NOTE — H&P (Signed)
Subjective: The patient is a 53 year old white male who has complained of neck pain and bilateral arm and hand numbness and tingling. He has failed medical management and was worked up with a cervical MRI which demonstrated multilevel degenerative changes, most prominent at C3-4 and C6-7. I discussed the various treatment options with the patient including surgery. He has weighed the risks, benefits, and alternative surgery and decided proceed with C3-4 and C6-7 anterior cervical discectomy, fusion, and plating.   Past Medical History  Diagnosis Date  . Hypertension   . Irregular heart beat     Pt feels this every once in a while; PCP aware but stated it was OK  . Tick fever   . Neck pain of over 3 months duration   . Frequency of urination   . Enlarged prostate     hx of  . History of kidney stones   . Environmental and seasonal allergies   . Leg pain, right     Take Lyrica and Naproxen  . HOH (hard of hearing)     wears bilateral hearing aids    Past Surgical History  Procedure Laterality Date  . Back surgery      Lumbar X 2  . Rotator cuff repair Left   . Leg surgery Right     Had surgery on right calf X 5  . Colonoscopy    . Hernia repair Left     groin  . Tonsillectomy      No Known Allergies  History  Substance Use Topics  . Smoking status: Never Smoker   . Smokeless tobacco: Not on file  . Alcohol Use: No    No family history on file. Prior to Admission medications   Medication Sig Start Date End Date Taking? Authorizing Provider  amLODipine-benazepril (LOTREL) 10-20 MG per capsule Take 1 capsule by mouth daily.   Yes Historical Provider, MD  aspirin EC 81 MG tablet Take 81 mg by mouth daily.   Yes Historical Provider, MD  cetirizine (ZYRTEC) 10 MG tablet Take 10 mg by mouth daily.   Yes Historical Provider, MD  diazepam (VALIUM) 5 MG tablet Take 5 mg by mouth at bedtime as needed (sleep).   Yes Historical Provider, MD  montelukast (SINGULAIR) 10 MG tablet Take  10 mg by mouth daily.   Yes Historical Provider, MD  Multiple Vitamin (MULTIVITAMIN WITH MINERALS) TABS tablet Take 1 tablet by mouth daily.   Yes Historical Provider, MD  naproxen (NAPROSYN) 500 MG tablet Take 500 mg by mouth 2 (two) times daily with a meal.   Yes Historical Provider, MD  oxymetazoline (AFRIN) 0.05 % nasal spray Place 2 sprays into both nostrils at bedtime as needed (allergies).   Yes Historical Provider, MD  pantoprazole (PROTONIX) 40 MG tablet Take 40 mg by mouth 2 (two) times daily.   Yes Historical Provider, MD  pregabalin (LYRICA) 300 MG capsule Take 300 mg by mouth 2 (two) times daily.   Yes Historical Provider, MD  silodosin (RAPAFLO) 8 MG CAPS capsule Take 8 mg by mouth daily.   Yes Historical Provider, MD     Review of Systems  Positive ROS: As above  All other systems have been reviewed and were otherwise negative with the exception of those mentioned in the HPI and as above.  Objective: Vital signs in last 24 hours: Temp:  [98.9 F (37.2 C)] 98.9 F (37.2 C) (12/03 0800) Pulse Rate:  [87] 87 (12/03 0800) BP: (139)/(78) 139/78 mmHg (12/03 0800) SpO2:  [  97 %] 97 % (12/03 0800) Weight:  [117.935 kg (260 lb)] 117.935 kg (260 lb) (12/03 0800)  General Appearance: Alert, cooperative, no distress, Head: Normocephalic, without obvious abnormality, atraumatic Eyes: PERRL, conjunctiva/corneas clear, EOM's intact,    Ears: Normal  Throat: Normal  Neck: Supple, symmetrical, trachea midline, no adenopathy; thyroid: No enlargement/tenderness/nodules; no carotid bruit or JVD Back: Symmetric, no curvature, ROM normal, no CVA tenderness Lungs: Clear to auscultation bilaterally, respirations unlabored Heart: Regular rate and rhythm, no murmur, rub or gallop Abdomen: Soft, non-tender,, no masses, no organomegaly Extremities: Extremities normal, atraumatic, no cyanosis or edema Pulses: 2+ and symmetric all extremities Skin: Skin color, texture, turgor normal, no rashes  or lesions  NEUROLOGIC:   Mental status: alert and oriented, no aphasia, good attention span, Fund of knowledge/ memory ok Motor Exam - grossly normal Sensory Exam - grossly normal Reflexes:  Coordination - grossly normal Gait - grossly normal Balance - grossly normal Cranial Nerves: I: smell Not tested  II: visual acuity  OS: Normal  OD: Normal   II: visual fields Full to confrontation  II: pupils Equal, round, reactive to light  III,VII: ptosis None  III,IV,VI: extraocular muscles  Full ROM  V: mastication Normal  V: facial light touch sensation  Normal  V,VII: corneal reflex  Present  VII: facial muscle function - upper  Normal  VII: facial muscle function - lower Normal  VIII: hearing Not tested  IX: soft palate elevation  Normal  IX,X: gag reflex Present  XI: trapezius strength  5/5  XI: sternocleidomastoid strength 5/5  XI: neck flexion strength  5/5  XII: tongue strength  Normal    Data Review Lab Results  Component Value Date   WBC 4.1 11/04/2014   HGB 11.6* 11/04/2014   HCT 36.8* 11/04/2014   MCV 62.6* 11/04/2014   PLT 165 11/04/2014   Lab Results  Component Value Date   NA 140 11/04/2014   K 4.7 11/04/2014   CL 103 11/04/2014   CO2 26 11/04/2014   BUN 12 11/04/2014   CREATININE 0.81 11/04/2014   GLUCOSE 120* 11/04/2014   No results found for: INR, PROTIME  Assessment/Plan: 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 I reviewed his imaging studies with him and pointed out the abnormality. We have discussed the various treatment options including surgery. I have described the surgical treatment option of C3-4 and C6-7 anterior cervical discectomy, fusion, and plating. I have shown him surgical models. We have discussed the risks, benefits, alternatives, and likelihood of achieving her goals with surgery. I have answered all the patient's  questions. He has decided to proceed with surgery.   Stran Raper D 11/13/2014 10:31 AM

## 2014-11-13 NOTE — Op Note (Signed)
Brief history: The patient is a 53 year old white male was complained of neck pain, arm and hand numbness and tingling and weakness. He has failed medical management was worked up with a cervical MRI. This demonstrated spondylosis, herniated disc, etc. with significant stenosis at C3-4 and C6-7. I discussed the various treatment options with the patient quitting surgery. He has weighed the risks, benefits, and alternatives surgery and decided to proceed with the C3-4 and C6-7 anterior cervical discectomy, fusion, and plating.  Preoperative diagnosis: C3-4 and C6-7 this degeneration, spondylosis, herniated disc, cervical myelopathy, cervical radiculopathy, cervicalgia, cervical spinal stenosis  Postoperative diagnosis: The same  Procedure: C3-4 and C6-7 Anterior cervical discectomy/decompression; C3-4 and C6-7 interbody arthrodesis with local morcellized autograft bone and Kinnex bone graft extender; insertion of interbody prosthesis at C3-4 and C6-7 (Zimmer peek interbody prosthesis); anterior cervical plating from C3-4 and C6-7 with globus titanium plate  Surgeon: Dr. Earle Gell  Asst.: Dr. Kristeen Miss  Anesthesia: Gen. endotracheal  Estimated blood loss: 100 mL  Drains: None  Complications: None  Description of procedure: The patient was brought to the operating room by the anesthesia team. General endotracheal anesthesia was induced. A roll was placed under the patient's shoulders to keep the neck in the neutral position. The patient's anterior cervical region was then prepared with Betadine scrub and Betadine solution. Sterile drapes were applied.  The area to be incised was then injected with Marcaine with epinephrine solution. I then used a scalpel to make a transverse incision in the patient's left anterior neck. I used the Metzenbaum scissors to divide the platysmal muscle and then to dissect medial to the sternocleidomastoid muscle, jugular vein, and carotid artery. I carefully  dissected down towards the anterior cervical spine identifying the esophagus and retracting it medially. Then using Kitner swabs to clear soft tissue from the anterior cervical spine. We then inserted a bent spinal needle into the upper exposed intervertebral disc space. We then obtained intraoperative radiographs confirm our location.  I then used electrocautery to detach the medial border of the longus colli muscle bilaterally from the C3-4 and C6-7 intervertebral disc spaces. I then inserted the Caspar self-retaining retractor underneath the longus colli muscle bilaterally to provide exposure.  We then incised the intervertebral disc at C3-4. We then performed a partial intervertebral discectomy with a pituitary forceps and the Karlin curettes. I then inserted distraction screws into the vertebral bodies at C3-4. We then distracted the interspace. We then used the high-speed drill to decorticate the vertebral endplates at Y3-0, to drill away the remainder of the intervertebral disc, to drill away some posterior spondylosis, and to thin out the posterior longitudinal ligament. I then incised ligament with the arachnoid knife. We then removed the ligament with a Kerrison punches undercutting the vertebral endplates and decompressing the thecal sac. We then performed foraminotomies about the bilateral C4 nerve roots. This completed the decompression at this level.  The then repeated this procedure in an analogous fashion at C6-7 decompressing the thecal sac and a bilateral C7 nerve roots.  We now turned our to attention to the interbody fusion. We used the trial spacers to determine the appropriate size for the interbody prosthesis. We then pre-filled prosthesis with a combination of local morcellized autograft bone that we obtained during decompression as well as Kinnex bone graft extender. We then inserted the prosthesis into the distracted interspace at C3-4 and C6-7. We then removed the distraction  screws. There was a good snug fit of the prosthesis in the  interspace.  Having completed the fusion we now turned attention to the anterior spinal instrumentation. We used the high-speed drill to drill away some anterior spondylosis at the disc spaces so that the plate lay down flat. We selected the appropriate length titanium anterior cervical plate. We laid it along the anterior aspect of the vertebral bodies from C3-4 and C6-7. We then drilled 14 mm holes at C3, C4, C6 and C7. We then secured the plate to the vertebral bodies by placing two 14 mm self-tapping screws at the C3, C4, C6 and C7. We then obtained intraoperative radiograph. The demonstrating good position of the instrumentation. We therefore secured the screws the plate the locking each cam. This completed the instrumentation.  We then obtained hemostasis using bipolar electrocautery. We irrigated the wound out with bacitracin solution. We then removed the retractor. We inspected the esophagus for any damage. There was none apparent. We then reapproximated patient's platysmal muscle with interrupted 3-0 Vicryl suture. We then reapproximated the subcutaneous tissue with interrupted 3-0 Vicryl suture. The skin was reapproximated with Steri-Strips and benzoin. The wound was then covered with bacitracin ointment. A sterile dressing was applied. The drapes were removed. Patient was subsequently extubated by the anesthesia team and transported to the post anesthesia care unit in stable condition. All sponge instrument and needle counts were reportedly correct at the end of this case.

## 2014-11-13 NOTE — Anesthesia Postprocedure Evaluation (Signed)
Anesthesia Post Note  Patient: Justin Soto  Procedure(s) Performed: Procedure(s) (LRB): Cervical three-four, Cervical six-seven anterior cervical decompression with fusion interbody prosthesis plating and bonegraft (N/A)  Anesthesia type: General  Patient location: PACU  Post pain: Pain level controlled and Adequate analgesia  Post assessment: Post-op Vital signs reviewed, Patient's Cardiovascular Status Stable, Respiratory Function Stable, Patent Airway and Pain level controlled  Last Vitals:  Filed Vitals:   11/13/14 1515  BP:   Pulse: 95  Temp:   Resp: 8    Post vital signs: Reviewed and stable  Level of consciousness: awake, alert  and oriented  Complications: No apparent anesthesia complications

## 2014-11-13 NOTE — Plan of Care (Signed)
Problem: Consults Goal: Spinal Surgery Patient Education See Patient Education Module for education specifics. Outcome: Completed/Met Date Met:  11/13/14     

## 2014-11-13 NOTE — Plan of Care (Signed)
Problem: Consults Goal: Diagnosis - Spinal Surgery Outcome: Completed/Met Date Met:  11/13/14 Cervical Spine Fusion

## 2014-11-13 NOTE — Plan of Care (Signed)
Problem: Phase II Progression Outcomes Goal: Progress activity as tolerated unless otherwise ordered Outcome: Progressing Goal: Discharge plan established Outcome: Progressing Goal: Tolerating diet Outcome: Progressing Goal: Verbalizes of donning/doffing brace Outcome: Progressing Goal: PT/OT consults completed Outcome: Not Applicable Date Met:  96/92/49

## 2014-11-13 NOTE — Anesthesia Preprocedure Evaluation (Signed)
Anesthesia Evaluation  Patient identified by MRN, date of birth, ID band Patient awake    Reviewed: Allergy & Precautions, H&P , NPO status , Patient's Chart, lab work & pertinent test results  Airway Mallampati: II   Neck ROM: full    Dental   Pulmonary neg pulmonary ROS,          Cardiovascular hypertension,     Neuro/Psych    GI/Hepatic   Endo/Other  obese  Renal/GU      Musculoskeletal   Abdominal   Peds  Hematology   Anesthesia Other Findings   Reproductive/Obstetrics                             Anesthesia Physical Anesthesia Plan  ASA: II  Anesthesia Plan: General   Post-op Pain Management:    Induction: Intravenous  Airway Management Planned: Oral ETT  Additional Equipment:   Intra-op Plan:   Post-operative Plan: Extubation in OR  Informed Consent: I have reviewed the patients History and Physical, chart, labs and discussed the procedure including the risks, benefits and alternatives for the proposed anesthesia with the patient or authorized representative who has indicated his/her understanding and acceptance.     Plan Discussed with: CRNA, Anesthesiologist and Surgeon  Anesthesia Plan Comments:         Anesthesia Quick Evaluation

## 2014-11-13 NOTE — Progress Notes (Signed)
Patient ID: Justin Soto, male   DOB: October 17, 1961, 53 y.o.   MRN: [id number] Subjective:  The patient is alert and pleasant. He is in no apparent distress. He looks well.  Objective: Vital signs in last 24 hours: Temp:  [98.1 F (36.7 C)-98.9 F (37.2 C)] 98.1 F (36.7 C) (12/03 1454) Pulse Rate:  [87] 87 (12/03 0800) BP: (139)/(78) 139/78 mmHg (12/03 0800) SpO2:  [97 %] 97 % (12/03 0800) Weight:  [117.935 kg (260 lb)] 117.935 kg (260 lb) (12/03 0800)  Intake/Output from previous day:   Intake/Output this shift: Total I/O In: 1800 [I.V.:1800] Out: 50 [Blood:50]  Physical exam the patient is alert and pleasant. His strength is grossly normal in all 4 extremities. His dressing is clean and dry. There is no hematoma or shift.  Lab Results: No results for input(s): WBC, HGB, HCT, PLT in the last 72 hours. BMET No results for input(s): NA, K, CL, CO2, GLUCOSE, BUN, CREATININE, CALCIUM in the last 72 hours.  Studies/Results: Dg Cervical Spine 2-3 Views  11/13/2014   CLINICAL DATA:  Status post anterior fusion  EXAM: CERVICAL SPINE - 2-3 VIEW  COMPARISON:  None.  FINDINGS: Two cross-table lateral cervical spine images are submitted. There is limited visualization inferior to the level of C4. On the first submitted image, there is a metallic probe with the tip overlying the C3-4 interspace row an anterior approach. On the second submitted image, there is anterior screw and plate fixation at C3 and C4. The screw and plate fixation device appears intact as does a disc spacer at C3-4. The bony structures which are visualized appear unremarkable without fracture or spondylolisthesis. The more inferior cervical spine region cannot be assessed on the studies.  IMPRESSION: Status post anterior fusion at C3 and C4 with the fixation devices appearing intact. Limited visualization of the lower cervical region. Upper cervical region demonstrates no fracture or spondylolisthesis.   Electronically Signed    By: Lowella Grip M.D.   On: 11/13/2014 14:29    Assessment/Plan: The patient is doing well. I spoke with his family.  LOS: 0 days     Annett Boxwell D 11/13/2014, 3:13 PM

## 2014-11-14 ENCOUNTER — Encounter (HOSPITAL_COMMUNITY): Payer: Self-pay | Admitting: Neurosurgery

## 2014-11-14 DIAGNOSIS — M5002 Cervical disc disorder with myelopathy, mid-cervical region: Secondary | ICD-10-CM | POA: Diagnosis not present

## 2014-11-14 MED ORDER — OXYCODONE-ACETAMINOPHEN 10-325 MG PO TABS: 1.0000 | ORAL_TABLET | ORAL | Status: DC | PRN

## 2014-11-14 MED ORDER — DIAZEPAM 5 MG PO TABS: 5.0000 mg | ORAL_TABLET | Freq: Four times a day (QID) | ORAL | Status: DC | PRN

## 2014-11-14 MED ORDER — DSS 100 MG PO CAPS: 100.0000 mg | ORAL_CAPSULE | Freq: Two times a day (BID) | ORAL | Status: DC

## 2014-11-14 NOTE — Progress Notes (Signed)
Patient alert and oriented, mae's well, voiding adequate amount of urine, swallowing without difficulty, no c/o pain. Patient discharged home with family. Script and discharged instructions given to patient. Patient and family stated understanding of d/c instructions given and has an appointment with MD. Aisha Ceasia Elwell RN 

## 2014-11-17 ENCOUNTER — Encounter (HOSPITAL_COMMUNITY): Payer: Self-pay | Admitting: Neurosurgery

## 2014-11-17 NOTE — Discharge Summary (Signed)
Physician Discharge Summary  Patient ID: Justin Soto MRN: [id number] DOB/AGE: [birth year] 53 y.o.  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 The surgery went well.  The patient's postoperative course was unremarkable. On postoperative day #1 the patient has to discharge to home. He was given oral and written discharge instructions. All his questions were answered.  Consults: None Significant Diagnostic Studies: None Treatments: C3-4 and C6-7 anterior cervical discectomy, fusion, and plating. Discharge Exam: Blood pressure 144/85, pulse 99, temperature 98.2 F (36.8 C), temperature source Oral, resp. rate 20, height 5\' 6"  (1.676 m), weight 117.935 kg (260 lb), SpO2 94 %. The patient is alert and pleasant. He looks well. His dressing is clean and dry. There is no hematoma or shift. He is moving all 4 extremities well.  Disposition: Home  Discharge Instructions    Call MD for:  difficulty breathing, headache or visual disturbances    Complete by:  As directed      Call MD for:  extreme fatigue    Complete by:  As directed      Call MD for:  hives    Complete by:  As directed      Call MD for:  persistant dizziness or light-headedness    Complete by:  As directed      Call MD for:  persistant nausea and vomiting    Complete by:  As directed      Call MD for:  redness, tenderness, or signs of infection (pain, swelling, redness, odor or green/yellow discharge around incision site)    Complete by:  As directed      Call MD for:  severe uncontrolled pain    Complete by:  As directed       Call MD for:  temperature >100.4    Complete by:  As directed      Diet - low sodium heart healthy    Complete by:  As directed      Discharge instructions    Complete by:  As directed   Call [phone] for a followup appointment. Take a stool softener while you are using pain medications.     Driving Restrictions    Complete by:  As directed   Do not drive for 2 weeks.     Increase activity slowly    Complete by:  As directed      Lifting restrictions    Complete by:  As directed   Do not lift more than 5 pounds. No excessive bending or twisting.     May shower / Bathe    Complete by:  As directed   He may shower after the pain she is removed 3 days after surgery. Leave the incision alone.     Remove dressing in 48 hours    Complete by:  As directed   Your stitches are under the scan and will dissolve by themselves. The Steri-Strips will fall off after you take a few showers. Do not rub back or pick at the wound, Leave the wound alone.            Medication List    STOP taking these medications  naproxen 500 MG tablet  Commonly known as:  NAPROSYN      TAKE these medications        amLODipine-benazepril 10-20 MG per capsule  Commonly known as:  LOTREL  Take 1 capsule by mouth daily.     aspirin EC 81 MG tablet  Take 81 mg by mouth daily.     cetirizine 10 MG tablet  Commonly known as:  ZYRTEC  Take 10 mg by mouth daily.     diazepam 5 MG tablet  Commonly known as:  VALIUM  Take 5 mg by mouth at bedtime as needed (sleep).     diazepam 5 MG tablet  Commonly known as:  VALIUM  Take 1 tablet (5 mg total) by mouth every 6 (six) hours as needed for muscle spasms.     DSS 100 MG Caps  Take 100 mg by mouth 2 (two) times daily.     montelukast 10 MG tablet  Commonly known as:  SINGULAIR  Take 10 mg by mouth daily.     multivitamin with minerals Tabs tablet  Take 1 tablet by mouth daily.     oxyCODONE-acetaminophen 10-325 MG per tablet  Commonly  known as:  PERCOCET  Take 1 tablet by mouth every 4 (four) hours as needed for pain.     oxymetazoline 0.05 % nasal spray  Commonly known as:  AFRIN  Place 2 sprays into both nostrils at bedtime as needed (allergies).     pantoprazole 40 MG tablet  Commonly known as:  PROTONIX  Take 40 mg by mouth 2 (two) times daily.     pregabalin 300 MG capsule  Commonly known as:  LYRICA  Take 300 mg by mouth 2 (two) times daily.     silodosin 8 MG Caps capsule  Commonly known as:  RAPAFLO  Take 8 mg by mouth daily.           Follow-up Information    Follow up with Ophelia Charter, MD.   Specialty:  Neurosurgery   Contact information:   [address] [place] [address] [phone]       Signed: Ophelia Charter 11/17/2014, 10:36 AM

## 2014-12-10 ENCOUNTER — Encounter (HOSPITAL_COMMUNITY): Payer: Self-pay | Admitting: Neurosurgery

## 2015-05-27 ENCOUNTER — Other Ambulatory Visit: Payer: Self-pay | Admitting: Specialist

## 2015-05-27 DIAGNOSIS — S86912A Strain of unspecified muscle(s) and tendon(s) at lower leg level, left leg, initial encounter: Secondary | ICD-10-CM

## 2015-05-28 ENCOUNTER — Other Ambulatory Visit: Payer: Self-pay | Admitting: Family Medicine

## 2015-05-28 DIAGNOSIS — J302 Other seasonal allergic rhinitis: Secondary | ICD-10-CM

## 2015-05-28 MED ORDER — CETIRIZINE HCL 10 MG PO TABS: 10.0000 mg | ORAL_TABLET | Freq: Every day | ORAL | Status: DC

## 2015-05-29 ENCOUNTER — Ambulatory Visit: Payer: Self-pay

## 2015-06-02 ENCOUNTER — Ambulatory Visit
Admission: RE | Admit: 2015-06-02 | Discharge: 2015-06-02 | Disposition: A | Discharge status: Home / Self Care | Payer: BLUE CROSS/BLUE SHIELD | Source: Ambulatory Visit | Attending: Specialist | Admitting: Specialist

## 2015-06-02 DIAGNOSIS — W19XXXA Unspecified fall, initial encounter: Secondary | ICD-10-CM | POA: Diagnosis not present

## 2015-06-02 DIAGNOSIS — S83222A Peripheral tear of medial meniscus, current injury, left knee, initial encounter: Secondary | ICD-10-CM | POA: Insufficient documentation

## 2015-06-02 DIAGNOSIS — S86912A Strain of unspecified muscle(s) and tendon(s) at lower leg level, left leg, initial encounter: Secondary | ICD-10-CM | POA: Diagnosis present

## 2015-06-04 ENCOUNTER — Ambulatory Visit: Payer: Self-pay

## 2015-06-09 ENCOUNTER — Other Ambulatory Visit: Payer: BLUE CROSS/BLUE SHIELD

## 2015-06-09 ENCOUNTER — Encounter: Payer: Self-pay | Admitting: *Deleted

## 2015-06-09 NOTE — Patient Instructions (Signed)
  Your procedure is scheduled on: 06-22-15 Report to [place] To find out your arrival time please call [phone] between 1PM - 3PM on 06-19-15  Remember: Instructions that are not followed completely may result in serious medical risk, up to and including death, or upon the discretion of your surgeon and anesthesiologist your surgery may need to be rescheduled.    _X___ 1. Do not eat food or drink liquids after midnight. No gum chewing or hard candies.     _X___ 2. No Alcohol for 24 hours before or after surgery.   ____ 3. Bring all medications with you on the day of surgery if instructed.    _X___ 4. Notify your doctor if there is any change in your medical condition     (cold, fever, infections).     Do not wear jewelry, make-up, hairpins, clips or nail polish.  Do not wear lotions, powders, or perfumes. You may wear deodorant.  Do not shave 48 hours prior to surgery. Men may shave face and neck.  Do not bring valuables to the hospital.    [facility] is not responsible for any belongings or valuables.               Contacts, dentures or bridgework may not be worn into surgery.  Leave your suitcase in the car. After surgery it may be brought to your room.  For patients admitted to the hospital, discharge time is determined by your  treatment team.   Patients discharged the day of surgery will not be allowed to drive home.   Please read over the following fact sheets that you were given:      _X___ Take these medicines the morning of surgery with A SIP OF WATER:    1. LOTREL  2. PROTONIX  3. RAPAFLO  4. SINGULAIR  5. LYRICA  6.  ____ Fleet Enema (as directed)   ____ Use CHG Soap as directed  ____ Use inhalers on the day of surgery  ____ Stop metformin 2 days prior to surgery    ____ Take 1/2 of usual insulin dose the night before surgery and none on the morning of surgery.   _X___ Stop Coumadin/Plavix/aspirin-STOP ASA 7 DAYS PRIOR TO  SURGERY  _X___ Stop Anti-inflammatories-STOP MELOXICAM 7 DAYS PRIOR TO SURGERY-NO NSAIDS OR ASA PRODUCTS-PERCOCET OK TO CONTINUE   ____ Stop supplements until after surgery.    ____ Bring C-Pap to the hospital.

## 2015-06-11 ENCOUNTER — Other Ambulatory Visit: Payer: Self-pay

## 2015-06-11 MED ORDER — AMLODIPINE BESY-BENAZEPRIL HCL 10-20 MG PO CAPS: 1.0000 | ORAL_CAPSULE | ORAL | Status: DC

## 2015-06-22 ENCOUNTER — Encounter: Payer: Self-pay | Admitting: *Deleted

## 2015-06-22 ENCOUNTER — Ambulatory Visit: Payer: BLUE CROSS/BLUE SHIELD | Admitting: Certified Registered"

## 2015-06-22 ENCOUNTER — Encounter
Admission: RE | Disposition: A | Discharge status: Home / Self Care | Payer: Self-pay | Source: Ambulatory Visit | Attending: Specialist

## 2015-06-22 ENCOUNTER — Ambulatory Visit
Admission: RE | Admit: 2015-06-22 | Discharge: 2015-06-22 | Disposition: A | Discharge status: Home / Self Care | Payer: BLUE CROSS/BLUE SHIELD | Source: Ambulatory Visit | Attending: Specialist | Admitting: Specialist

## 2015-06-22 DIAGNOSIS — Z79899 Other long term (current) drug therapy: Secondary | ICD-10-CM | POA: Insufficient documentation

## 2015-06-22 DIAGNOSIS — X58XXXA Exposure to other specified factors, initial encounter: Secondary | ICD-10-CM | POA: Diagnosis not present

## 2015-06-22 DIAGNOSIS — S76112A Strain of left quadriceps muscle, fascia and tendon, initial encounter: Secondary | ICD-10-CM | POA: Insufficient documentation

## 2015-06-22 DIAGNOSIS — M79605 Pain in left leg: Secondary | ICD-10-CM | POA: Diagnosis not present

## 2015-06-22 DIAGNOSIS — R531 Weakness: Secondary | ICD-10-CM | POA: Insufficient documentation

## 2015-06-22 HISTORY — PX: QUADRICEPS TENDON REPAIR: SHX756

## 2015-06-22 HISTORY — DX: Anemia, unspecified: D64.9

## 2015-06-22 HISTORY — DX: Gastro-esophageal reflux disease without esophagitis: K21.9

## 2015-06-22 HISTORY — DX: Sleep apnea, unspecified: G47.30

## 2015-06-22 SURGERY — REPAIR, TENDON, QUADRICEPS
Anesthesia: General | Laterality: Left | Wound class: Clean

## 2015-06-22 MED ORDER — HYDROCODONE-ACETAMINOPHEN 7.5-325 MG PO TABS
ORAL_TABLET | ORAL | Status: AC
  Filled 2015-06-22: qty 1

## 2015-06-22 MED ORDER — FENTANYL CITRATE (PF) 100 MCG/2ML IJ SOLN
25.0000 ug | INTRAMUSCULAR | Status: AC | PRN
  Administered 2015-06-22 (×2): 25 ug via INTRAVENOUS

## 2015-06-22 MED ORDER — FENTANYL CITRATE (PF) 100 MCG/2ML IJ SOLN
25.0000 ug | INTRAMUSCULAR | Status: AC | PRN
  Administered 2015-06-22 (×6): 25 ug via INTRAVENOUS

## 2015-06-22 MED ORDER — LIDOCAINE HCL (CARDIAC) 20 MG/ML IV SOLN
INTRAVENOUS | Status: DC | PRN
  Administered 2015-06-22: 50 mg via INTRAVENOUS

## 2015-06-22 MED ORDER — HYDROCODONE-ACETAMINOPHEN 7.5-325 MG PO TABS: 1.0000 | ORAL_TABLET | Freq: Four times a day (QID) | ORAL | Status: DC | PRN

## 2015-06-22 MED ORDER — ALBUTEROL SULFATE HFA 108 (90 BASE) MCG/ACT IN AERS
INHALATION_SPRAY | RESPIRATORY_TRACT | Status: DC | PRN
  Administered 2015-06-22 (×2): 4 via RESPIRATORY_TRACT

## 2015-06-22 MED ORDER — GABAPENTIN 400 MG PO CAPS: 400.0000 mg | ORAL_CAPSULE | Freq: Three times a day (TID) | ORAL | Status: DC

## 2015-06-22 MED ORDER — BUPIVACAINE HCL 0.5 % IJ SOLN
INTRAMUSCULAR | Status: DC | PRN
  Administered 2015-06-22: 60 mL

## 2015-06-22 MED ORDER — MIDAZOLAM HCL 2 MG/2ML IJ SOLN
INTRAMUSCULAR | Status: DC | PRN
  Administered 2015-06-22: 2 mg via INTRAVENOUS

## 2015-06-22 MED ORDER — FENTANYL CITRATE (PF) 100 MCG/2ML IJ SOLN
INTRAMUSCULAR | Status: AC
  Administered 2015-06-22: 25 ug via INTRAVENOUS
  Filled 2015-06-22: qty 2

## 2015-06-22 MED ORDER — CLINDAMYCIN PHOSPHATE 900 MG/50ML IV SOLN
INTRAVENOUS | Status: AC
  Filled 2015-06-22: qty 50

## 2015-06-22 MED ORDER — PROPOFOL 10 MG/ML IV BOLUS
INTRAVENOUS | Status: DC | PRN
  Administered 2015-06-22: 200 mg via INTRAVENOUS
  Administered 2015-06-22: 100 mg via INTRAVENOUS

## 2015-06-22 MED ORDER — CEFAZOLIN SODIUM-DEXTROSE 2-3 GM-% IV SOLR
INTRAVENOUS | Status: AC
  Filled 2015-06-22: qty 50

## 2015-06-22 MED ORDER — MELOXICAM 15 MG PO TABS: 15.0000 mg | ORAL_TABLET | Freq: Every day | ORAL | Status: DC

## 2015-06-22 MED ORDER — SUCCINYLCHOLINE CHLORIDE 20 MG/ML IJ SOLN
INTRAMUSCULAR | Status: DC | PRN
  Administered 2015-06-22: 50 mg via INTRAVENOUS
  Administered 2015-06-22: 100 mg via INTRAVENOUS

## 2015-06-22 MED ORDER — MELOXICAM 7.5 MG PO TABS
ORAL_TABLET | ORAL | Status: AC
  Administered 2015-06-22: 15 mg via ORAL
  Filled 2015-06-22: qty 2

## 2015-06-22 MED ORDER — LACTATED RINGERS IV SOLN
Freq: Once | INTRAVENOUS | Status: AC
  Administered 2015-06-22: 13:00:00 via INTRAVENOUS

## 2015-06-22 MED ORDER — FENTANYL CITRATE (PF) 100 MCG/2ML IJ SOLN
INTRAMUSCULAR | Status: DC | PRN
  Administered 2015-06-22: 50 ug via INTRAVENOUS
  Administered 2015-06-22: 150 ug via INTRAVENOUS
  Administered 2015-06-22: 50 ug via INTRAVENOUS

## 2015-06-22 MED ORDER — MELOXICAM 7.5 MG PO TABS
15.0000 mg | ORAL_TABLET | Freq: Once | ORAL | Status: AC
  Administered 2015-06-22: 15 mg via ORAL

## 2015-06-22 MED ORDER — NEOMYCIN-POLYMYXIN B GU 40-200000 IR SOLN
Status: AC
  Filled 2015-06-22: qty 4

## 2015-06-22 MED ORDER — DEXAMETHASONE SODIUM PHOSPHATE 4 MG/ML IJ SOLN
INTRAMUSCULAR | Status: DC | PRN
  Administered 2015-06-22: 10 mg via INTRAVENOUS

## 2015-06-22 MED ORDER — GABAPENTIN 400 MG PO CAPS
400.0000 mg | ORAL_CAPSULE | Freq: Once | ORAL | Status: AC
  Administered 2015-06-22: 400 mg via ORAL

## 2015-06-22 MED ORDER — ONDANSETRON HCL 4 MG/2ML IJ SOLN
INTRAMUSCULAR | Status: DC | PRN
  Administered 2015-06-22: 4 mg via INTRAVENOUS

## 2015-06-22 MED ORDER — PHENYLEPHRINE HCL 10 MG/ML IJ SOLN
INTRAMUSCULAR | Status: DC | PRN
Start: 2015-06-22 — End: 2015-06-22
  Administered 2015-06-22 (×2): 100 ug via INTRAVENOUS

## 2015-06-22 MED ORDER — HYDROCODONE-ACETAMINOPHEN 7.5-325 MG PO TABS
1.0000 | ORAL_TABLET | Freq: Four times a day (QID) | ORAL | Status: DC | PRN
  Administered 2015-06-22: 1 via ORAL

## 2015-06-22 MED ORDER — CEFAZOLIN SODIUM-DEXTROSE 2-3 GM-% IV SOLR
2.0000 g | Freq: Once | INTRAVENOUS | Status: AC
  Administered 2015-06-22: 2 g via INTRAVENOUS

## 2015-06-22 MED ORDER — ONDANSETRON HCL 4 MG/2ML IJ SOLN: 4.0000 mg | Freq: Once | INTRAMUSCULAR | Status: DC | PRN

## 2015-06-22 MED ORDER — GABAPENTIN 400 MG PO CAPS
ORAL_CAPSULE | ORAL | Status: AC
Start: 2015-06-22 — End: 2015-06-22
  Administered 2015-06-22: 400 mg via ORAL
  Filled 2015-06-22: qty 1

## 2015-06-22 MED ORDER — BUPIVACAINE HCL (PF) 0.5 % IJ SOLN
INTRAMUSCULAR | Status: AC
  Filled 2015-06-22: qty 60

## 2015-06-22 MED ORDER — FUROSEMIDE 10 MG/ML IJ SOLN
INTRAMUSCULAR | Status: DC | PRN
  Administered 2015-06-22: 10 mg via INTRAMUSCULAR

## 2015-06-22 MED ORDER — ROCURONIUM BROMIDE 100 MG/10ML IV SOLN
INTRAVENOUS | Status: DC | PRN
  Administered 2015-06-22: 20 mg via INTRAVENOUS

## 2015-06-22 MED ORDER — EPHEDRINE SULFATE 50 MG/ML IJ SOLN
INTRAMUSCULAR | Status: DC | PRN
  Administered 2015-06-22 (×2): 10 mg via INTRAVENOUS

## 2015-06-22 MED ORDER — NEOMYCIN-POLYMYXIN B GU 40-200000 IR SOLN
Status: DC | PRN
  Administered 2015-06-22: 4 mL

## 2015-06-22 MED ORDER — CLINDAMYCIN PHOSPHATE 900 MG/50ML IV SOLN
900.0000 mg | Freq: Once | INTRAVENOUS | Status: AC
  Administered 2015-06-22: 900 mg via INTRAVENOUS

## 2015-06-22 SURGICAL SUPPLY — 35 items
4.8MM FAST CUTTING BUR ×1 IMPLANT
BUR 4.8X51.2 (BURR) ×1 IMPLANT
CANISTER SUCT 1200ML W/VALVE (MISCELLANEOUS) ×2 IMPLANT
CHLORAPREP W/TINT 26ML (MISCELLANEOUS) ×3 IMPLANT
COOLER POLAR GLACIER W/PUMP (MISCELLANEOUS) ×2 IMPLANT
DECANTER SPIKE VIAL GLASS SM (MISCELLANEOUS) ×2 IMPLANT
DRAPE U-SHAPE 47X51 STRL (DRAPES) ×1 IMPLANT
DRSG AQUACEL AG ADV 3.5X10 (GAUZE/BANDAGES/DRESSINGS) ×1 IMPLANT
GAUZE SPONGE 4X4 12PLY STRL (GAUZE/BANDAGES/DRESSINGS) ×2 IMPLANT
GLOVE INDICATOR 8.0 STRL GRN (GLOVE) ×2 IMPLANT
GLOVE SURG ORTHO 8.5 STRL (GLOVE) ×2 IMPLANT
GOWN STRL REUS W/ TWL LRG LVL3 (GOWN DISPOSABLE) ×1 IMPLANT
GOWN STRL REUS W/TWL LRG LVL3 (GOWN DISPOSABLE) ×2
GOWN STRL REUS W/TWL LRG LVL4 (GOWN DISPOSABLE) ×2 IMPLANT
IMMBOLIZER KNEE 19 BLUE UNIV (SOFTGOODS) ×1 IMPLANT
IMMOB KNEE 24 THIGH 24 443303 (SOFTGOODS) ×1 IMPLANT
KIT RM TURNOVER STRD PROC AR (KITS) ×2 IMPLANT
NDL SPNL 18GX3.5 QUINCKE PK (NEEDLE) IMPLANT
NEEDLE SPNL 18GX3.5 QUINCKE PK (NEEDLE) ×2 IMPLANT
NS IRRIG 1000ML POUR BTL (IV SOLUTION) ×2 IMPLANT
PACK TOTAL KNEE (MISCELLANEOUS) ×2 IMPLANT
PAD GROUND ADULT SPLIT (MISCELLANEOUS) ×2 IMPLANT
PAD WRAPON POLAR KNEE (MISCELLANEOUS) ×1 IMPLANT
PASSER SUT SWANSON 36MM LOOP (INSTRUMENTS) ×1 IMPLANT
RETRIEVER SUT HEWSON (MISCELLANEOUS) ×1 IMPLANT
STAPLER SKIN PROX 35W (STAPLE) ×2 IMPLANT
SUT FIBERWIRE #5 38 BLUE (WIRE) IMPLANT
SUT FIBERWIRE #5 38 CONV BLUE (SUTURE) ×4
SUT ORTHOCORD OS-6 NDL 36 (SUTURE) ×2 IMPLANT
SUT ORTHOCORD W/MULTIPK NDL (SUTURE) IMPLANT
SUT VIC AB 2-0 CT1 27 (SUTURE) ×2
SUT VIC AB 2-0 CT1 TAPERPNT 27 (SUTURE) ×1 IMPLANT
SUTURE FIBERWR #5 38 CONV BLUE (SUTURE) IMPLANT
SYR 50ML LL SCALE MARK (SYRINGE) ×1 IMPLANT
WRAPON POLAR PAD KNEE (MISCELLANEOUS) ×2

## 2015-06-22 NOTE — H&P (Signed)
THE PATIENT WAS SEEN IN THE HOLDING AREA.  HISTORY, ALLERGIES, HOME MEDICATIONS AND OPERATIVE PROCEDURE WERE REVIEWED. RISKS AND BENEFITS OF SURGERY DISCUSSED WITH PATIENT AGAIN.  NO CHANGES FROM INITIAL HISTORY AND PHYSICAL NOTED.    

## 2015-06-22 NOTE — Discharge Instructions (Signed)
Polar care Elevate leg Light weight on leg Keep clean and dryAMBULATORY SURGERY  DISCHARGE INSTRUCTIONS   1) The drugs that you were given will stay in your system until tomorrow so for the next 24 hours you should not:  A) Drive an automobile B) Make any legal decisions C) Drink any alcoholic beverage   2) You may resume regular meals tomorrow.  Today it is better to start with liquids and gradually work up to solid foods.  You may eat anything you prefer, but it is better to start with liquids, then soup and crackers, and gradually work up to solid foods.   3) Please notify your doctor immediately if you have any unusual bleeding, trouble breathing, redness and pain at the surgery site, drainage, fever, or pain not relieved by medication.    4) Additional Instructions:        Please contact your physician with any problems or Same Day Surgery at [phone], Monday through Friday 6 am to 4 pm, or [place] at [facility] number at [phone].

## 2015-06-22 NOTE — Op Note (Signed)
06/22/2015  2:46 PM  PATIENT:  Justin Soto    PRE-OPERATIVE DIAGNOSIS:  STRAIN OF QUADRICEPS LEFT  POST-OPERATIVE DIAGNOSIS:  Same  PROCEDURE:  REPAIR QUADRICEPS TENDON LEFT  SURGEON:  Park Breed, MD  ANESTHESIA:   General  PREOPERATIVE INDICATIONS:  Justin Soto is a  54 y.o. male with a diagnosis of STRAIN OF THE LEFT  QUADRICEPS who failed conservative measures and elected for surgical management.    The risks benefits and alternatives were discussed with the patient preoperatively including but not limited to the risks of infection, bleeding, nerve injury, cardiopulmonary complications, the need for revision surgery, among others, and the patient was willing to proceed.  EBL: Minimal  TOURNIQUET TIME  OPERATIVE IMPLANTS: 61 minutes  OPERATIVE FINDINGS: Complete rupture of the rectus femoris and vastus medialis and lateralis  OPERATIVE PROCEDURE: The patient was brought to the operating room and underwent general endotracheal anesthesia in the supine position.  The operative leg was prepped and draped in a sterile fashion.  Esmarch was applied and tourniquet inflated to 350 mmHg.  Tourniquet time was 61 minutes.  An anterior midline incision was made and dissection carried out sharply.  Subcutaneous tissue.  A large amount of serous fluid was encountered.  The rectus femoris and vastus muscles were completely.  This disrupted.  The vastus intermedius was intact.  The tendon ends were freshened up.  The superior aspect of patella was exposed and a bur used to freshen up the bone.  3 drill holes were then made through the patella proximal to distal.  #5 FiberWire suture was passed through the ruptured tendons and muscle medially and laterally.  The sutures were then passed through the tunnels in the patella.  With the knee in full extension, the sutures were tied securely, bringing the tendon back to anatomic position.  The tendons were further repaired with multiple #2 Orthocord  sutures.  Subcutaneous tissue was closed with 20 Vicryls and the skin was closed with staples.  1/2% Sensorcaine was placed in the soft tissues.  Dry sterile dressing was applied along with Polar Care and knee immobilizer.  Tourniquet was deflated with good return of blood flow to the foot.  The patient was awakened and taken to recovery in good condition.  Park Breed, MD

## 2015-06-22 NOTE — Anesthesia Postprocedure Evaluation (Signed)
  Anesthesia Post-op Note  Patient: Justin Soto  Procedure(s) Performed: Procedure(s): REPAIR QUADRICEP TENDON (Left)  Anesthesia type:General  Patient location: PACU  Post pain: Pain level controlled  Post assessment: Post-op Vital signs reviewed, Patient's Cardiovascular Status Stable, Respiratory Function Stable, Patent Airway and No signs of Nausea or vomiting  Post vital signs: Reviewed and stable  Last Vitals:  Filed Vitals:   06/22/15 1550  BP:   Pulse: 107  Temp: 37.3 C  Resp: 19    Level of consciousness: awake, alert  and patient cooperative  Complications: No apparent anesthesia complications... Anterior cords and intubated with a McGrath.Marland KitchenMarland Kitchen

## 2015-06-22 NOTE — Anesthesia Preprocedure Evaluation (Addendum)
Anesthesia Evaluation  Patient identified by MRN, date of birth, ID band Patient awake    Reviewed: Allergy & Precautions, NPO status , Patient's Chart, lab work & pertinent test results  Airway Mallampati: II  TM Distance: >3 FB Neck ROM: Limited    Dental  (+) Chipped   Pulmonary sleep apnea ,  breath sounds clear to auscultation  Pulmonary exam normal       Cardiovascular hypertension, Normal cardiovascular exam    Neuro/Psych Hx of cervical disc disease with repair...with radiculopathy... Also leg pain  Neuromuscular disease    GI/Hepatic Neg liver ROS, GERD-  Medicated and Controlled,  Endo/Other  negative endocrine ROS  Renal/GU Renal diseaseKidney stones  negative genitourinary   Musculoskeletal  (+) Arthritis -, Osteoarthritis,    Abdominal (+) + obese,   Peds negative pediatric ROS (+)  Hematology  (+) anemia ,   Anesthesia Other Findings   Reproductive/Obstetrics                            Anesthesia Physical Anesthesia Plan  ASA: III  Anesthesia Plan: General   Post-op Pain Management:    Induction: Intravenous and Rapid sequence  Airway Management Planned: Oral ETT  Additional Equipment:   Intra-op Plan:   Post-operative Plan: Extubation in OR  Informed Consent: I have reviewed the patients History and Physical, chart, labs and discussed the procedure including the risks, benefits and alternatives for the proposed anesthesia with the patient or authorized representative who has indicated his/her understanding and acceptance.   Dental advisory given  Plan Discussed with: Surgeon and CRNA  Anesthesia Plan Comments:         Anesthesia Quick Evaluation

## 2015-06-22 NOTE — Transfer of Care (Signed)
Immediate Anesthesia Transfer of Care Note  Patient: Justin Soto  Procedure(s) Performed: Procedure(s): REPAIR QUADRICEP TENDON (Left)  Patient Location: PACU  Anesthesia Type:General  Level of Consciousness: awake  Airway & Oxygen Therapy: Patient Spontanous Breathing and Patient connected to face mask oxygen  Post-op Assessment: Report given to RN  Post vital signs: Reviewed  Last Vitals:  Filed Vitals:   06/22/15 1439  BP: 146/96  Pulse: 99  Temp: 36.7 C  Resp: 18    Complications: No apparent anesthesia complications

## 2015-06-22 NOTE — Op Note (Signed)
06/22/2015  2:38 PM  PATIENT:  Justin Soto    PRE-OPERATIVE DIAGNOSIS:  STRAIN OF QUADRICEPS LEFT  POST-OPERATIVE DIAGNOSIS:  Same  PROCEDURE:  REPAIR QUADRICEPS TENDON LEFT  SURGEON:  Park Breed, MD  ANESTHESIA:   General  PREOPERATIVE INDICATIONS:  Justin Soto is a  54 y.o. male with a diagnosis of STRAIN OF THE LEFT  QUADRICEPS who failed conservative measures and elected for surgical management.    The risks benefits and alternatives were discussed with the patient preoperatively including but not limited to the risks of infection, bleeding, nerve injury, cardiopulmonary complications, the need for revision surgery, among others, and the patient was willing to proceed.  EBL: Minimal  TOURNIQUET TIME  OPERATIVE IMPLANTS: 61 minutes  OPERATIVE FINDINGS: Complete rupture of the rectus femoris and vastus medialis and lateralis  OPERATIVE PROCEDURE: The patient was brought to the operating room and underwent general endotracheal anesthesia in the supine position.  The operative leg was prepped and draped in a sterile fashion.  Esmarch was applied and tourniquet inflated to 350 mmHg.  Tourniquet time was 61 minutes.  An anterior midline incision was made and dissection carried out sharply.  Subcutaneous tissue.  A large amount of serous fluid was encountered.  The rectus femoris and vastus muscles were completely.  This disrupted.  The vastus intermedius was intact.  The tendon ends were freshened up.  The superior aspect of patella was exposed and a bur used to freshen up the bone.  3 drill holes were then made through the patella proximal to distal.  #5 FiberWire suture was passed through the ruptured tendons and muscle medially and laterally.  The sutures were then passed through the tunnels in the patella.  With the knee in full extension, the sutures were tied securely, bringing the tendon back to anatomic position.  The tendons were further repaired with multiple #2 Orthocord  sutures.  Subcutaneous tissue was closed with 20 Vicryls and the skin was closed with staples.  1/2% Sensorcaine was placed in the soft tissues.  Dry sterile dressing was applied along with Polar Care and knee immobilizer.  Tourniquet was deflated with good return of blood flow to the foot.  The patient was awakened and taken to recovery in good condition.  Park Breed, MD

## 2015-06-22 NOTE — Anesthesia Procedure Notes (Addendum)
Procedure Name: Intubation Date/Time: 06/22/2015 12:48 PM Performed by: Rolla Plate Pre-anesthesia Checklist: Patient identified, Patient being monitored, Timeout performed, Emergency Drugs available and Suction available Patient Re-evaluated:Patient Re-evaluated prior to inductionOxygen Delivery Method: Circle system utilized Preoxygenation: Pre-oxygenation with 100% oxygen Intubation Type: IV induction, Rapid sequence and Cricoid Pressure applied Ventilation: Two handed mask ventilation required Laryngoscope Size: Miller, 2 and McGraph Grade View: Grade III Tube type: Oral Tube size: 7.5 mm Number of attempts: 3 Airway Equipment and Method: Stylet,  Patient positioned with wedge pillow and Bougie stylet Placement Confirmation: ETT inserted through vocal cords under direct vision,  positive ETCO2 and breath sounds checked- equal and bilateral Secured at: 23 cm Tube secured with: Tape Dental Injury: Teeth and Oropharynx as per pre-operative assessment  Difficulty Due To: Difficulty was anticipated, Difficult Airway- due to reduced neck mobility and Difficult Airway- due to anterior larynx Future Recommendations: Recommend- induction with short-acting agent, and alternative techniques readily available

## 2015-06-23 ENCOUNTER — Encounter: Payer: Self-pay | Admitting: Specialist

## 2015-06-26 ENCOUNTER — Encounter: Payer: Self-pay | Admitting: Specialist

## 2015-06-30 ENCOUNTER — Encounter: Payer: Self-pay | Admitting: Specialist

## 2015-08-04 ENCOUNTER — Other Ambulatory Visit: Payer: Self-pay

## 2015-08-04 NOTE — Telephone Encounter (Signed)
Fax from [employer] requesting this medication-aa

## 2015-08-05 MED ORDER — PREGABALIN 300 MG PO CAPS: 300.0000 mg | ORAL_CAPSULE | Freq: Two times a day (BID) | ORAL | Status: DC

## 2015-08-12 ENCOUNTER — Encounter
Admission: RE | Disposition: A | Discharge status: Home / Self Care | Payer: Self-pay | Source: Ambulatory Visit | Attending: Specialist

## 2015-08-12 ENCOUNTER — Ambulatory Visit: Payer: BLUE CROSS/BLUE SHIELD | Admitting: Certified Registered Nurse Anesthetist

## 2015-08-12 ENCOUNTER — Encounter: Payer: Self-pay | Admitting: *Deleted

## 2015-08-12 ENCOUNTER — Ambulatory Visit
Admission: RE | Admit: 2015-08-12 | Discharge: 2015-08-12 | Disposition: A | Discharge status: Home / Self Care | Payer: BLUE CROSS/BLUE SHIELD | Source: Ambulatory Visit | Attending: Specialist | Admitting: Specialist

## 2015-08-12 DIAGNOSIS — Z7982 Long term (current) use of aspirin: Secondary | ICD-10-CM | POA: Insufficient documentation

## 2015-08-12 DIAGNOSIS — K219 Gastro-esophageal reflux disease without esophagitis: Secondary | ICD-10-CM | POA: Diagnosis not present

## 2015-08-12 DIAGNOSIS — N400 Enlarged prostate without lower urinary tract symptoms: Secondary | ICD-10-CM | POA: Diagnosis not present

## 2015-08-12 DIAGNOSIS — I499 Cardiac arrhythmia, unspecified: Secondary | ICD-10-CM | POA: Insufficient documentation

## 2015-08-12 DIAGNOSIS — I10 Essential (primary) hypertension: Secondary | ICD-10-CM | POA: Insufficient documentation

## 2015-08-12 DIAGNOSIS — H9190 Unspecified hearing loss, unspecified ear: Secondary | ICD-10-CM | POA: Insufficient documentation

## 2015-08-12 DIAGNOSIS — D649 Anemia, unspecified: Secondary | ICD-10-CM | POA: Insufficient documentation

## 2015-08-12 DIAGNOSIS — M1990 Unspecified osteoarthritis, unspecified site: Secondary | ICD-10-CM | POA: Diagnosis not present

## 2015-08-12 DIAGNOSIS — M66262 Spontaneous rupture of extensor tendons, left lower leg: Secondary | ICD-10-CM | POA: Diagnosis present

## 2015-08-12 DIAGNOSIS — Z6841 Body Mass Index (BMI) 40.0 and over, adult: Secondary | ICD-10-CM | POA: Insufficient documentation

## 2015-08-12 DIAGNOSIS — S76112A Strain of left quadriceps muscle, fascia and tendon, initial encounter: Secondary | ICD-10-CM

## 2015-08-12 HISTORY — PX: QUADRICEPS TENDON REPAIR: SHX756

## 2015-08-12 SURGERY — REPAIR, TENDON, QUADRICEPS
Anesthesia: General | Site: Knee | Laterality: Left | Wound class: Clean

## 2015-08-12 MED ORDER — CLINDAMYCIN PHOSPHATE 900 MG/50ML IV SOLN
INTRAVENOUS | Status: AC
  Administered 2015-08-12: 900 mg via INTRAVENOUS
  Filled 2015-08-12: qty 50

## 2015-08-12 MED ORDER — CLINDAMYCIN PHOSPHATE 900 MG/50ML IV SOLN: 900.0000 mg | Freq: Once | INTRAVENOUS | Status: DC

## 2015-08-12 MED ORDER — GABAPENTIN 400 MG PO CAPS
ORAL_CAPSULE | ORAL | Status: AC
  Filled 2015-08-12: qty 1

## 2015-08-12 MED ORDER — PHENYLEPHRINE HCL 10 MG/ML IJ SOLN
INTRAMUSCULAR | Status: DC | PRN
  Administered 2015-08-12 (×3): 100 ug via INTRAVENOUS
  Administered 2015-08-12 (×2): 200 ug via INTRAVENOUS
  Administered 2015-08-12: 100 ug via INTRAVENOUS

## 2015-08-12 MED ORDER — EPHEDRINE SULFATE 50 MG/ML IJ SOLN
INTRAMUSCULAR | Status: DC | PRN
  Administered 2015-08-12: 10 mg via INTRAVENOUS
  Administered 2015-08-12: 5 mg via INTRAVENOUS

## 2015-08-12 MED ORDER — ACETAMINOPHEN 10 MG/ML IV SOLN
INTRAVENOUS | Status: AC
  Filled 2015-08-12: qty 100

## 2015-08-12 MED ORDER — HYDROMORPHONE HCL 1 MG/ML IJ SOLN
INTRAMUSCULAR | Status: AC
  Filled 2015-08-12: qty 1

## 2015-08-12 MED ORDER — ONDANSETRON HCL 4 MG/2ML IJ SOLN
INTRAMUSCULAR | Status: DC | PRN
  Administered 2015-08-12: 4 mg via INTRAVENOUS

## 2015-08-12 MED ORDER — GABAPENTIN 400 MG PO CAPS: 400.0000 mg | ORAL_CAPSULE | Freq: Three times a day (TID) | ORAL | Status: DC

## 2015-08-12 MED ORDER — OXYCODONE-ACETAMINOPHEN 5-325 MG PO TABS: 1.0000 | ORAL_TABLET | ORAL | Status: DC | PRN

## 2015-08-12 MED ORDER — MELOXICAM 7.5 MG PO TABS
ORAL_TABLET | ORAL | Status: AC
  Filled 2015-08-12: qty 2

## 2015-08-12 MED ORDER — FENTANYL CITRATE (PF) 100 MCG/2ML IJ SOLN
INTRAMUSCULAR | Status: DC | PRN
Start: 2015-08-12 — End: 2015-08-12
  Administered 2015-08-12 (×3): 50 ug via INTRAVENOUS

## 2015-08-12 MED ORDER — ACETAMINOPHEN 10 MG/ML IV SOLN
INTRAVENOUS | Status: DC | PRN
  Administered 2015-08-12: 1000 mg via INTRAVENOUS

## 2015-08-12 MED ORDER — LIDOCAINE HCL (CARDIAC) 20 MG/ML IV SOLN
INTRAVENOUS | Status: DC | PRN
  Administered 2015-08-12: 60 mg via INTRAVENOUS

## 2015-08-12 MED ORDER — SUGAMMADEX SODIUM 200 MG/2ML IV SOLN
INTRAVENOUS | Status: DC | PRN
  Administered 2015-08-12: 200 mg via INTRAVENOUS

## 2015-08-12 MED ORDER — LACTATED RINGERS IV SOLN
INTRAVENOUS | Status: DC
  Administered 2015-08-12: 10:00:00 via INTRAVENOUS

## 2015-08-12 MED ORDER — SUCCINYLCHOLINE CHLORIDE 20 MG/ML IJ SOLN
INTRAMUSCULAR | Status: DC | PRN
  Administered 2015-08-12: 60 mg via INTRAVENOUS
  Administered 2015-08-12: 120 mg via INTRAVENOUS

## 2015-08-12 MED ORDER — MIDAZOLAM HCL 2 MG/2ML IJ SOLN
INTRAMUSCULAR | Status: DC | PRN
  Administered 2015-08-12: 2 mg via INTRAVENOUS

## 2015-08-12 MED ORDER — FENTANYL CITRATE (PF) 100 MCG/2ML IJ SOLN
INTRAMUSCULAR | Status: AC
  Filled 2015-08-12: qty 2

## 2015-08-12 MED ORDER — NEOMYCIN-POLYMYXIN B GU 40-200000 IR SOLN
Status: DC | PRN
  Administered 2015-08-12: 4 mL

## 2015-08-12 MED ORDER — BUPIVACAINE HCL (PF) 0.5 % IJ SOLN
INTRAMUSCULAR | Status: DC | PRN
  Administered 2015-08-12: 30 mL

## 2015-08-12 MED ORDER — PROMETHAZINE HCL 25 MG/ML IJ SOLN: 6.2500 mg | INTRAMUSCULAR | Status: DC | PRN

## 2015-08-12 MED ORDER — CEFAZOLIN SODIUM-DEXTROSE 2-3 GM-% IV SOLR
2.0000 g | Freq: Once | INTRAVENOUS | Status: AC
  Administered 2015-08-12: 2 g via INTRAVENOUS

## 2015-08-12 MED ORDER — FENTANYL CITRATE (PF) 100 MCG/2ML IJ SOLN: 25.0000 ug | INTRAMUSCULAR | Status: DC | PRN

## 2015-08-12 MED ORDER — ROCURONIUM BROMIDE 100 MG/10ML IV SOLN
INTRAVENOUS | Status: DC | PRN
  Administered 2015-08-12: 20 mg via INTRAVENOUS

## 2015-08-12 MED ORDER — HYDROCODONE-ACETAMINOPHEN 7.5-325 MG PO TABS: 1.0000 | ORAL_TABLET | Freq: Four times a day (QID) | ORAL | Status: DC | PRN

## 2015-08-12 MED ORDER — PROPOFOL 10 MG/ML IV BOLUS
INTRAVENOUS | Status: DC | PRN
  Administered 2015-08-12: 170 mg via INTRAVENOUS

## 2015-08-12 MED ORDER — HYDROMORPHONE HCL 1 MG/ML IJ SOLN
0.2500 mg | INTRAMUSCULAR | Status: DC | PRN
  Administered 2015-08-12 (×4): 0.5 mg via INTRAVENOUS

## 2015-08-12 MED ORDER — MELOXICAM 7.5 MG PO TABS
15.0000 mg | ORAL_TABLET | Freq: Once | ORAL | Status: AC
Start: 2015-08-12 — End: 2015-08-12
  Administered 2015-08-12: 15 mg via ORAL

## 2015-08-12 MED ORDER — OXYCODONE-ACETAMINOPHEN 10-325 MG PO TABS: 1.0000 | ORAL_TABLET | Freq: Four times a day (QID) | ORAL | Status: DC | PRN

## 2015-08-12 MED ORDER — NEOMYCIN-POLYMYXIN B GU 40-200000 IR SOLN
Status: AC
  Filled 2015-08-12: qty 4

## 2015-08-12 MED ORDER — FENTANYL CITRATE (PF) 100 MCG/2ML IJ SOLN
25.0000 ug | INTRAMUSCULAR | Status: DC | PRN
  Administered 2015-08-12: 50 ug via INTRAVENOUS
  Administered 2015-08-12 (×2): 25 ug via INTRAVENOUS

## 2015-08-12 MED ORDER — CEFAZOLIN SODIUM-DEXTROSE 2-3 GM-% IV SOLR
INTRAVENOUS | Status: AC
  Filled 2015-08-12: qty 50

## 2015-08-12 MED ORDER — GABAPENTIN 400 MG PO CAPS
400.0000 mg | ORAL_CAPSULE | Freq: Once | ORAL | Status: AC
  Administered 2015-08-12: 400 mg via ORAL

## 2015-08-12 MED ORDER — BUPIVACAINE HCL (PF) 0.5 % IJ SOLN
INTRAMUSCULAR | Status: AC
  Filled 2015-08-12: qty 30

## 2015-08-12 SURGICAL SUPPLY — 33 items
ANCHOR HEALICOIL PK 4.5 (Anchor) ×2 IMPLANT
CANISTER SUCT 1200ML W/VALVE (MISCELLANEOUS) ×2 IMPLANT
CHLORAPREP W/TINT 26ML (MISCELLANEOUS) ×2 IMPLANT
COOLER POLAR GLACIER W/PUMP (MISCELLANEOUS) ×2 IMPLANT
DRAPE INCISE IOBAN 66X45 STRL (DRAPES) ×1 IMPLANT
DRSG AQUACEL AG ADV 3.5X10 (GAUZE/BANDAGES/DRESSINGS) ×1 IMPLANT
GAUZE SPONGE 4X4 12PLY STRL (GAUZE/BANDAGES/DRESSINGS) ×2 IMPLANT
GLOVE INDICATOR 8.0 STRL GRN (GLOVE) ×2 IMPLANT
GLOVE SURG ORTHO 8.5 STRL (GLOVE) ×2 IMPLANT
GOWN STRL REUS W/ TWL LRG LVL3 (GOWN DISPOSABLE) ×1 IMPLANT
GOWN STRL REUS W/TWL LRG LVL3 (GOWN DISPOSABLE) ×2
GOWN STRL REUS W/TWL LRG LVL4 (GOWN DISPOSABLE) ×2 IMPLANT
IMMBOLIZER KNEE 19 BLUE UNIV (SOFTGOODS) ×1 IMPLANT
IMMOB KNEE 24 THIGH 24 443303 (SOFTGOODS) ×1 IMPLANT
KIT RM TURNOVER STRD PROC AR (KITS) ×2 IMPLANT
NDL SAFETY ECLIPSE 18X1.5 (NEEDLE) IMPLANT
NEEDLE HYPO 18GX1.5 SHARP (NEEDLE) ×2
NS IRRIG 1000ML POUR BTL (IV SOLUTION) ×2 IMPLANT
PACK EXTREMITY ARMC (MISCELLANEOUS) ×1 IMPLANT
PACK TOTAL KNEE (MISCELLANEOUS) ×1 IMPLANT
PAD GROUND ADULT SPLIT (MISCELLANEOUS) ×2 IMPLANT
PAD WRAPON POLAR KNEE (MISCELLANEOUS) ×1 IMPLANT
PADDING CAST 4IN STRL (MISCELLANEOUS) ×1
PADDING CAST BLEND 4X4 STRL (MISCELLANEOUS) IMPLANT
PASSER SUT SWANSON 36MM LOOP (INSTRUMENTS) ×1 IMPLANT
STAPLER SKIN PROX 35W (STAPLE) ×2 IMPLANT
STOCKINETTE BIAS CUT 6 980064 (GAUZE/BANDAGES/DRESSINGS) ×1 IMPLANT
SUT MERSILENE 2.0 SH NDLE (SUTURE) ×1 IMPLANT
SUT ORTHOCORD OS-6 NDL 36 (SUTURE) ×2 IMPLANT
SUT VIC AB 2-0 CT1 27 (SUTURE) ×2
SUT VIC AB 2-0 CT1 TAPERPNT 27 (SUTURE) ×1 IMPLANT
SYR 30ML LL (SYRINGE) ×1 IMPLANT
WRAPON POLAR PAD KNEE (MISCELLANEOUS) ×2

## 2015-08-12 NOTE — H&P (Signed)
THE PATIENT WAS SEEN IN THE HOLDING AREA.  HISTORY, ALLERGIES, HOME MEDICATIONS AND OPERATIVE PROCEDURE WERE REVIEWED. RISKS AND BENEFITS OF SURGERY DISCUSSED WITH PATIENT AGAIN.  NO CHANGES FROM INITIAL HISTORY AND PHYSICAL NOTED.    

## 2015-08-12 NOTE — Anesthesia Preprocedure Evaluation (Signed)
Anesthesia Evaluation  Patient identified by MRN, date of birth, ID band Patient awake    Reviewed: Allergy & Precautions, NPO status , Patient's Chart, lab work & pertinent test results  History of Anesthesia Complications Negative for: history of anesthetic complications  Airway Mallampati: II  TM Distance: >3 FB Neck ROM: Limited    Dental  (+) Chipped   Pulmonary neg shortness of breath, sleep apnea , neg COPDneg recent URI,  breath sounds clear to auscultation  Pulmonary exam normal       Cardiovascular Exercise Tolerance: Good hypertension, On Medications - angina- CAD, - Past MI, - Cardiac Stents and - CABG Normal cardiovascular exam+ dysrhythmias (irregular heartbeat, but nothing diagnosed.  Happens rarely) - Valvular Problems/Murmurs    Neuro/Psych Hx of cervical disc disease with repair...with radiculopathy... Also leg pain  Neuromuscular disease negative psych ROS   GI/Hepatic Neg liver ROS, GERD-  Medicated and Controlled,  Endo/Other  neg diabetesMorbid obesity  Renal/GU Renal diseaseKidney stones  negative genitourinary   Musculoskeletal  (+) Arthritis -, Osteoarthritis,    Abdominal (+) + obese,   Peds negative pediatric ROS (+)  Hematology  (+) anemia ,   Anesthesia Other Findings Past Medical History:   Hypertension                                                 Irregular heart beat                                           Comment:Pt feels this every once in a while; PCP aware               but stated it was OK   Tick fever                                                   Neck pain of over 3 months duration                          Frequency of urination                                       Enlarged prostate                                              Comment:hx of   History of kidney stones                                     Environmental and seasonal allergies                        Leg pain, right  Comment:Take Lyrica and Naproxen   HOH (hard of hearing)                                          Comment:wears bilateral hearing aids   Sleep apnea                                                    Comment:NO CPAP   GERD (gastroesophageal reflux disease)                       Anemia                                                       Reproductive/Obstetrics negative OB ROS                             Anesthesia Physical  Anesthesia Plan  ASA: III  Anesthesia Plan: General   Post-op Pain Management:    Induction: Intravenous and Rapid sequence  Airway Management Planned: Oral ETT  Additional Equipment:   Intra-op Plan:   Post-operative Plan: Extubation in OR  Informed Consent: I have reviewed the patients History and Physical, chart, labs and discussed the procedure including the risks, benefits and alternatives for the proposed anesthesia with the patient or authorized representative who has indicated his/her understanding and acceptance.   Dental advisory given  Plan Discussed with: Surgeon and CRNA  Anesthesia Plan Comments:         Anesthesia Quick Evaluation

## 2015-08-12 NOTE — Progress Notes (Signed)
Immobilizer dry and intact

## 2015-08-12 NOTE — Anesthesia Procedure Notes (Signed)
Procedure Name: Intubation Date/Time: 08/12/2015 11:34 AM Performed by: Demetrius Charity Pre-anesthesia Checklist: Patient identified, Patient being monitored, Timeout performed, Emergency Drugs available and Suction available Patient Re-evaluated:Patient Re-evaluated prior to inductionOxygen Delivery Method: Circle system utilized Preoxygenation: Pre-oxygenation with 100% oxygen Intubation Type: IV induction Ventilation: Mask ventilation without difficulty Laryngoscope Size: Glidescope and 4 Tube type: Oral Tube size: 7.0 mm Number of attempts: 2 Airway Equipment and Method: Stylet Placement Confirmation: ETT inserted through vocal cords under direct vision,  positive ETCO2 and breath sounds checked- equal and bilateral Secured at: 23 cm Tube secured with: Tape Dental Injury: Teeth and Oropharynx as per pre-operative assessment  Difficulty Due To: Difficulty was anticipated Future Recommendations: Recommend- induction with short-acting agent, and alternative techniques readily available

## 2015-08-12 NOTE — Discharge Instructions (Addendum)
ICE KNEE   PARTIAL WEIGHT ON LEG  CALL FOR ANY PROBLEMS@[email or web address] (Error - No data available.)@@[email or web address] (Error - No data available.)@[email or web address]  SURGERY  DISCHARGE INSTRUCTIONS   1) The drugs that you were given will stay in your system until tomorrow so for the next 24 hours you should not:  A) Drive an automobile B) Make any legal decisions C) Drink any alcoholic beverage   2) You may resume regular meals tomorrow.  Today it is better to start with liquids and gradually work up to solid foods.  You may eat anything you prefer, but it is better to start with liquids, then soup and crackers, and gradually work up to solid foods.   3) Please notify your doctor immediately if you have any unusual bleeding, trouble breathing, redness and pain at the surgery site, drainage, fever, or pain not relieved by medication.    4) Additional Instructions:        Please contact your physician with any problems or Same Day Surgery at [phone], Monday through Friday 6 am to 4 pm, or [place] at [facility] number at [phone].AMBULATORY SURGERY  DISCHARGE INSTRUCTIONS   5) The drugs that you were given will stay in your system until tomorrow so for the next 24 hours you should not:  D) Drive an automobile E) Make any legal decisions F) Drink any alcoholic beverage   6) You may resume regular meals tomorrow.  Today it is better to start with liquids and gradually work up to solid foods.  You may eat anything you prefer, but it is better to start with liquids, then soup and crackers, and gradually work up to solid foods.   7) Please notify your doctor immediately if you have any unusual bleeding, trouble breathing, redness and pain at the surgery site, drainage, fever, or pain not relieved by medication.    8) Additional Instructions:        Please contact your physician with any problems or Same Day Surgery at [phone], Monday through Friday 6 am to 4 pm, or  [place] at [facility] number at [phone].

## 2015-08-12 NOTE — Op Note (Signed)
   08/12/2015  12:43 PM  PATIENT:  Justin Soto    PRE-OPERATIVE DIAGNOSIS:  RERUPTURE  LEFT QUADRACEP TENDON  POST-OPERATIVE DIAGNOSIS:  Same  PROCEDURE:  REPAIR LEFT QUADRICEP TENDON  SURGEON:  Park Breed, MD  ANESTHESIA:   General  PREOPERATIVE INDICATIONS:  Justin Soto is a  54 y.o. male with a diagnosis of RERUPTURE LEFT QUADRACEPS TENDON who had an extensor lag and gap in the quadriceps attachment to the patella  and elected for surgical management.    The risks benefits and alternatives were discussed with the patient preoperatively including but not limited to the risks of infection, bleeding, nerve injury, cardiopulmonary complications, the need for revision surgery, among others, and the patient was willing to proceed.  EBL: NONE  TOURNIQUET TIME: 36 MIN  OPERATIVE IMPLANTS: 2 Smith & Nephew 0.1VC helical coils with ultra tape  OPERATIVE FINDINGS: The patient had ruptured the central attachment of the quadriceps to the patella again.  The #5 Tycron sutures were ruptured and were removed.  He did not re-tear of the capsule medially or laterally.  OPERATIVE PROCEDURE: The patient was brought to the operating room and underwent general endotracheal anesthesia in the supine position. The operative leg was prepped and draped in a sterile fashion. Esmarch was applied and tourniquet inflated to 350 mmHg. Tourniquet time was   36  minutes. An anterior midline incision was made and dissection carried out sharply through subcutaneous tissue. Hervey Ard dissection and periosteal dissection was used to expose the anterior surface of the quadriceps tendon and patella.  There was a small amount of blood here and the previous #5 Tycron sutures were seen to be ruptured The tendon ends were freshened up. The superior aspect of patella was exposed.  2 previous drill holes were visible and an awl was used to expand these.  2 Smith [employer] Nephew 4.5 mm helicacoils were then introduced into the  patella with extremely strong fixation.The altered tapes from each helical coil were then passed through the quadriceps tendon in a zigzag fashion and with the knee in full extension, the sutures were tied securely, bringing the tendon back to anatomic position. The tendons were further repaired with multiple #2 Mersilene sutures.Final irrigation was carried out.  Subcutaneous tissue was closed with 2-0 Vicryl and the skin was closed with staples. Sponge and needle counts were correct. 1/2% Sensorcaine was placed in the soft tissues. Dry sterile dressing was applied along with  knee immobilizer. Tourniquet was deflated with good return of blood flow to the foot. The patient was awakened and taken to recovery in good condition.  Park Breed, MD

## 2015-08-12 NOTE — Transfer of Care (Addendum)
Immediate Anesthesia Transfer of Care Note  Patient: Justin Soto  Procedure(s) Performed: Procedure(s): REPAIR QUADRICEP TENDON (Left)  Patient Location: PACU  Anesthesia Type:General  Level of Consciousness: awake, alert  and oriented  Airway & Oxygen Therapy: Patient Spontanous Breathing and Patient connected to face mask oxygen  Post-op Assessment: Report given to RN and Post -op Vital signs reviewed and stable  Post vital signs: Reviewed and stable  Last Vitals:  Filed Vitals:   08/12/15 0849  BP: 143/86  Pulse: 87  Temp: 36.8 C  Resp: 18  Bp 145/73 HR 277 Resp 17  Complications: No apparent anesthesia complications

## 2015-08-12 NOTE — Progress Notes (Signed)
Per Dr Sabra Heck, will put on a different brace in OR (old brace sent with pt belongings to postop locker)

## 2015-08-13 NOTE — Anesthesia Postprocedure Evaluation (Signed)
  Anesthesia Post-op Note  Patient: Justin Soto  Procedure(s) Performed: Procedure(s): REPAIR QUADRICEP TENDON (Left)  Anesthesia type:General  Patient location: PACU  Post pain: Pain level controlled  Post assessment: Post-op Vital signs reviewed, Patient's Cardiovascular Status Stable, Respiratory Function Stable, Patent Airway and No signs of Nausea or vomiting  Post vital signs: Reviewed and stable  Last Vitals:  Filed Vitals:   08/12/15 1514  BP: 152/96  Pulse: 115  Temp:   Resp:     Level of consciousness: awake, alert  and patient cooperative  Complications: No apparent anesthesia complications

## 2015-08-14 ENCOUNTER — Encounter: Payer: Self-pay | Admitting: Specialist

## 2015-09-08 ENCOUNTER — Other Ambulatory Visit: Payer: Self-pay

## 2015-09-08 DIAGNOSIS — K219 Gastro-esophageal reflux disease without esophagitis: Secondary | ICD-10-CM | POA: Insufficient documentation

## 2015-09-08 DIAGNOSIS — J302 Other seasonal allergic rhinitis: Secondary | ICD-10-CM | POA: Insufficient documentation

## 2015-09-08 DIAGNOSIS — D569 Thalassemia, unspecified: Secondary | ICD-10-CM | POA: Insufficient documentation

## 2015-09-08 DIAGNOSIS — I152 Hypertension secondary to endocrine disorders: Secondary | ICD-10-CM | POA: Insufficient documentation

## 2015-09-08 DIAGNOSIS — G4700 Insomnia, unspecified: Secondary | ICD-10-CM | POA: Insufficient documentation

## 2015-09-08 DIAGNOSIS — R000 Tachycardia, unspecified: Secondary | ICD-10-CM | POA: Insufficient documentation

## 2015-09-08 DIAGNOSIS — M7660 Achilles tendinitis, unspecified leg: Secondary | ICD-10-CM | POA: Insufficient documentation

## 2015-09-08 DIAGNOSIS — Z87438 Personal history of other diseases of 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 organs: Secondary | ICD-10-CM | POA: Insufficient documentation

## 2015-09-08 DIAGNOSIS — R413 Other amnesia: Secondary | ICD-10-CM | POA: Insufficient documentation

## 2015-09-08 DIAGNOSIS — F329 Major depressive disorder, single episode, unspecified: Secondary | ICD-10-CM | POA: Insufficient documentation

## 2015-09-08 DIAGNOSIS — J301 Allergic rhinitis due to pollen: Secondary | ICD-10-CM | POA: Insufficient documentation

## 2015-09-08 DIAGNOSIS — M5030 Other cervical disc degeneration, unspecified cervical region: Secondary | ICD-10-CM | POA: Insufficient documentation

## 2015-09-08 DIAGNOSIS — M5020 Other cervical disc displacement, unspecified cervical region: Secondary | ICD-10-CM | POA: Insufficient documentation

## 2015-09-08 DIAGNOSIS — L0390 Cellulitis, unspecified: Secondary | ICD-10-CM | POA: Insufficient documentation

## 2015-09-08 DIAGNOSIS — N401 Enlarged prostate with lower urinary tract symptoms: Secondary | ICD-10-CM | POA: Insufficient documentation

## 2015-09-08 DIAGNOSIS — M546 Pain in thoracic spine: Secondary | ICD-10-CM | POA: Insufficient documentation

## 2015-09-08 DIAGNOSIS — D649 Anemia, unspecified: Secondary | ICD-10-CM | POA: Insufficient documentation

## 2015-09-08 DIAGNOSIS — I82409 Acute embolism and thrombosis of unspecified deep veins of unspecified lower extremity: Secondary | ICD-10-CM | POA: Insufficient documentation

## 2015-09-08 DIAGNOSIS — D509 Iron deficiency anemia, unspecified: Secondary | ICD-10-CM | POA: Insufficient documentation

## 2015-09-08 DIAGNOSIS — F988 Other specified behavioral and emotional disorders with onset usually occurring in childhood and adolescence: Secondary | ICD-10-CM | POA: Insufficient documentation

## 2015-09-08 DIAGNOSIS — I10 Essential (primary) hypertension: Secondary | ICD-10-CM | POA: Insufficient documentation

## 2015-09-08 DIAGNOSIS — F32A Depression, unspecified: Secondary | ICD-10-CM | POA: Insufficient documentation

## 2015-09-09 ENCOUNTER — Ambulatory Visit (INDEPENDENT_AMBULATORY_CARE_PROVIDER_SITE_OTHER): Payer: BLUE CROSS/BLUE SHIELD | Admitting: Family Medicine

## 2015-09-09 ENCOUNTER — Ambulatory Visit (INDEPENDENT_AMBULATORY_CARE_PROVIDER_SITE_OTHER): Payer: BLUE CROSS/BLUE SHIELD | Admitting: Obstetrics and Gynecology

## 2015-09-09 ENCOUNTER — Encounter: Payer: Self-pay | Admitting: Family Medicine

## 2015-09-09 ENCOUNTER — Encounter: Payer: Self-pay | Admitting: Obstetrics and Gynecology

## 2015-09-09 VITALS — BP 144/94 | HR 104 | Resp 16 | Ht 66.0 in | Wt 271.5 lb

## 2015-09-09 VITALS — BP 140/80 | HR 76 | Temp 98.5°F | Resp 16 | Wt 270.8 lb

## 2015-09-09 DIAGNOSIS — R350 Frequency of micturition: Secondary | ICD-10-CM | POA: Diagnosis not present

## 2015-09-09 DIAGNOSIS — N400 Enlarged prostate without lower urinary tract symptoms: Secondary | ICD-10-CM

## 2015-09-09 DIAGNOSIS — G4730 Sleep apnea, unspecified: Secondary | ICD-10-CM | POA: Diagnosis not present

## 2015-09-09 DIAGNOSIS — Z125 Encounter for screening for malignant neoplasm of prostate: Secondary | ICD-10-CM | POA: Diagnosis not present

## 2015-09-09 LAB — BLADDER SCAN AMB NON-IMAGING

## 2015-09-09 MED ORDER — TAMSULOSIN HCL 0.4 MG PO CAPS: 0.8000 mg | ORAL_CAPSULE | Freq: Every day | ORAL | Status: DC

## 2015-09-09 MED ORDER — PREGABALIN 300 MG PO CAPS: 300.0000 mg | ORAL_CAPSULE | Freq: Two times a day (BID) | ORAL | Status: DC

## 2015-09-09 NOTE — Progress Notes (Signed)
Patient: Justin Soto Male    DOB: 17-Feb-1961   54 y.o.   MRN: [id number] Visit Date: 09/09/2015  Today's Provider: [name], MD   Chief Complaint  Patient presents with  . Hypertension  . Gastrophageal Reflux   Subjective:    Gastrophageal Reflux   GERD, Follow up:  The patient was last seen for GERD 1 years ago. Changes made since that visit include prilosec in the morning.  He reports good compliance with treatment. He is not having side effects.  He IS experiencing heartburn with certain foods.   ------------------------------------------------------------------------   Hypertension, follow-up:  BP Readings from Last 3 Encounters:  09/09/15 140/80  08/12/15 152/96  06/22/15 159/86    He was last seen for hypertension 1 years ago.  BP at that visit was 152/96. Management since that visit includes no salt. He reports fair compliance with treatment. He is not having side effects.  He is not exercising. He is adherent to low salt diet.   Outside blood pressures are 186/86 per patient when had the knee surgery. Cardiovascular risk factors include hypertension.     Weight trend: stable Wt Readings from Last 3 Encounters:  09/09/15 270 lb 12.8 oz (122.834 kg)  08/12/15 270 lb (122.471 kg)  06/22/15 270 lb (122.471 kg)    Current diet: in general, a "healthy" diet    ------------------------------------------------------------------------       Allergies  Allergen Reactions  . Methocarbamol Other (See Comments)    Numbness, and change of taste   . Tizanidine Nausea Only   Previous Medications   AMLODIPINE-BENAZEPRIL (LOTREL) 10-20 MG PER CAPSULE    Take 1 capsule by mouth every morning.   ASPIRIN EC 81 MG TABLET    Take 81 mg by mouth daily.   CETIRIZINE (ZYRTEC) 10 MG TABLET    Take 1 tablet (10 mg total) by mouth daily.   DIAZEPAM (VALIUM) 5 MG TABLET    Take 1 tablet (5 mg total) by mouth every 6 (six) hours as needed for  muscle spasms.   DOCUSATE SODIUM 100 MG CAPS    Take 100 mg by mouth 2 (two) times daily.   FLUTICASONE (FLONASE) 50 MCG/ACT NASAL SPRAY    Place into the nose.   GABAPENTIN (NEURONTIN) 400 MG CAPSULE    Take 1 capsule (400 mg total) by mouth 3 (three) times daily.   MELOXICAM (MOBIC) 15 MG TABLET    Take 1 tablet (15 mg total) by mouth daily.   MONTELUKAST (SINGULAIR) 10 MG TABLET    Take 10 mg by mouth every morning.    MULTIPLE VITAMIN (MULTIVITAMIN WITH MINERALS) TABS TABLET    Take 1 tablet by mouth daily.   OMEPRAZOLE (PRILOSEC) 10 MG CAPSULE    Take 10 mg by mouth daily.   OXYCODONE-ACETAMINOPHEN (PERCOCET) 10-325 MG PER TABLET    Take 1 tablet by mouth every 6 (six) hours as needed for pain.   PANTOPRAZOLE (PROTONIX) 40 MG TABLET    Take 40 mg by mouth 2 (two) times daily.   PREGABALIN (LYRICA) 300 MG CAPSULE    Take 1 capsule (300 mg total) by mouth 2 (two) times daily.   SILODOSIN (RAPAFLO) 8 MG CAPS CAPSULE    Take 8 mg by mouth every morning.     Review of Systems  Constitutional: Negative.   HENT: Negative.   Eyes: Negative.   Respiratory: Negative.   Cardiovascular: Negative.   Gastrointestinal: Negative.   Endocrine:  Negative.   Genitourinary: Negative.   Musculoskeletal: Negative.   Skin: Negative.   Allergic/Immunologic: Negative.   Neurological: Negative.   Hematological: Negative.   Psychiatric/Behavioral: Negative.     Social History  Substance Use Topics  . Smoking status: Never Smoker   . Smokeless tobacco: Not on file  . Alcohol Use: No   Objective:   BP 140/80 mmHg  Pulse 76  Temp(Src) 98.5 F (36.9 C) (Oral)  Resp 16  Wt 270 lb 12.8 oz (122.834 kg)  Physical Exam  Constitutional: He is oriented to person, place, and time. He appears well-developed and well-nourished.  HENT:  Head: Normocephalic and atraumatic.  Right Ear: External ear normal.  Left Ear: External ear normal.  Nose: Nose normal.  Eyes: Conjunctivae are normal.  Neck: Neck  supple.  Cardiovascular: Normal rate, regular rhythm, normal heart sounds and intact distal pulses.   Pulmonary/Chest: Effort normal and breath sounds normal.  Abdominal: Soft.  Neurological: He is alert and oriented to person, place, and time.  Skin: Skin is warm and dry.  Psychiatric: He has a normal mood and affect. His behavior is normal. Judgment and thought content normal.        Assessment & Plan:     1. BPH (benign prostatic hypertrophy)  - Ambulatory referral to Urology  2. Sleep apnea  - Ambulatory referral to Sleep Studies 3s/p recent knee surgery 4.HTN      I have done the exam and reviewed the above chart and it is accurate to the best of my knowledge.  Richard Cranford Mon, MD  [facility]

## 2015-09-09 NOTE — Patient Instructions (Signed)
Stop and start tamsulosin 0.4mg  once daily at night.  You may increase dose to 2 tabs at night after a few days if needed for symptom management.  Stop medication if you before dizzy or lightheaded and notify our office.

## 2015-09-09 NOTE — Progress Notes (Addendum)
09/09/2015 4:40 PM   Justin Soto 16-Jul-1961 [id number]  Referring provider: Jerrol Banana., MD [address] [place], [place] [address]  Chief Complaint  Patient presents with  . Benign Prostatic Hypertrophy  . Establish Care    HPI:  Patient is a 54 year old male presenting today with complaints of urinary frequency and nocturia increasing over the last month. Patient states he experienced similar symptoms for years ago and he was started on Rapaflo and his symptoms resolved. Patient states he still is taking Rapaflo but symptoms are continuing to worsen. He is especially bothered by nocturia every hour nightly. Daytime frequency every 1-2 hours with small frequent voids.   Patient's primary care provider is scheduling a sleep study for him.  Family medical history significant for father with prostate cancer.  Patient states that he does get his yearly physicals with Dr. Rosanna Randy including a DRE and PSA though he has not had his physical this year because he had recent knee surgery.  I-PSS 27 QOL 6  PMH: Past Medical History  Diagnosis Date  . Hypertension   . Irregular heart beat     Pt feels this every once in a while; PCP aware but stated it was OK  . Tick fever   . Neck pain of over 3 months duration   . Frequency of urination   . Enlarged prostate     hx of  . History of kidney stones   . Environmental and seasonal allergies   . Leg pain, right     Take Lyrica and Naproxen  . HOH (hard of hearing)     wears bilateral hearing aids  . Sleep apnea     NO CPAP  . GERD (gastroesophageal reflux disease)   . Anemia     Surgical History: Past Surgical History  Procedure Laterality Date  . Back surgery      Lumbar X 2  . Rotator cuff repair Left   . Leg surgery Right     Had surgery on right calf X 5  . Colonoscopy    . Hernia repair Left     groin  . Tonsillectomy    . Anterior cervical decomp/discectomy fusion N/A 11/13/2014   Procedure: Cervical three-four, Cervical six-seven anterior cervical decompression with fusion interbody prosthesis plating and bonegraft;  Surgeon: Newman Pies, MD;  Location: [facility];  Service: Neurosurgery;  Laterality: N/A;  Cervical three-four, Cervical six-seven anterior cervical decompression with fusion interbody prosthesis plating and bonegraft  . Lithotripsy    . Quadriceps tendon repair Left 06/22/2015    Procedure: REPAIR QUADRICEP TENDON;  Surgeon: Earnestine Leys, MD;  Location: [facility];  Service: Orthopedics;  Laterality: Left;  . Quadriceps tendon repair Left 08/12/2015    Procedure: REPAIR QUADRICEP TENDON;  Surgeon: Earnestine Leys, MD;  Location: [facility];  Service: Orthopedics;  Laterality: Left;    Home Medications:    Medication List       This list is accurate as of: 09/09/15  4:40 PM.  Always use your most recent med list.               amLODipine-benazepril 10-20 MG capsule  Commonly known as:  LOTREL  Take 1 capsule by mouth every morning.     aspirin EC 81 MG tablet  Take 81 mg by mouth daily.     cetirizine 10 MG tablet  Commonly known as:  ZYRTEC  Take 1 tablet (10 mg total) by mouth daily.  DSS 100 MG Caps  Take 100 mg by mouth 2 (two) times daily.     fluticasone 50 MCG/ACT nasal spray  Commonly known as:  FLONASE  Place into the nose.     gabapentin 400 MG capsule  Commonly known as:  NEURONTIN  Take 1 capsule (400 mg total) by mouth 3 (three) times daily.     meloxicam 15 MG tablet  Commonly known as:  MOBIC  Take 1 tablet (15 mg total) by mouth daily.     montelukast 10 MG tablet  Commonly known as:  SINGULAIR  Take 10 mg by mouth every morning.     multivitamin with minerals Tabs tablet  Take 1 tablet by mouth daily.     omeprazole 10 MG capsule  Commonly known as:  PRILOSEC  Take 10 mg by mouth daily.     oxyCODONE-acetaminophen 10-325 MG tablet  Commonly known as:  PERCOCET  Take 1 tablet by mouth every 6 (six) hours as  needed for pain.     pregabalin 300 MG capsule  Commonly known as:  LYRICA  Take 1 capsule (300 mg total) by mouth 2 (two) times daily.     tamsulosin 0.4 MG Caps capsule  Commonly known as:  FLOMAX  Take 2 capsules (0.8 mg total) by mouth daily.        Allergies:  Allergies  Allergen Reactions  . Methocarbamol Other (See Comments)    Numbness, and change of taste   . Tizanidine Nausea Only    Family History: Family History  Problem Relation Age of Onset  . Prostate cancer Father     Social History:  reports that he has never smoked. He does not have any smokeless tobacco history on file. He reports that he does not drink alcohol or use illicit drugs.  ROS: UROLOGY Frequent Urination?: Yes Hard to postpone urination?: No Burning/pain with urination?: No Get up at night to urinate?: Yes Leakage of urine?: No Urine stream starts and stops?: Yes Trouble starting stream?: No Do you have to strain to urinate?: Yes Blood in urine?: No Urinary tract infection?: No Sexually transmitted disease?: No Injury to kidneys or bladder?: No Painful intercourse?: No Weak stream?: No Erection problems?: No Penile pain?: No  Gastrointestinal Nausea?: No Vomiting?: No Indigestion/heartburn?: No Diarrhea?: No Constipation?: No  Constitutional Fever: No Night sweats?: No Weight loss?: No Fatigue?: No  Skin Skin rash/lesions?: No Itching?: No  Eyes Blurred vision?: No Double vision?: No  Ears/Nose/Throat Sore throat?: No Sinus problems?: No  Hematologic/Lymphatic Swollen glands?: No Easy bruising?: No  Cardiovascular Leg swelling?: No Chest pain?: No  Respiratory Cough?: No Shortness of breath?: No  Endocrine Excessive thirst?: No  Musculoskeletal Back pain?: No Joint pain?: No  Neurological Headaches?: No Dizziness?: No  Psychologic Depression?: No Anxiety?: No  Physical Exam: BP 144/94 mmHg  Pulse 104  Resp 16  Ht 5\' 6"  (1.676 m)  Wt  271 lb 8 oz (123.152 kg)  BMI 43.84 kg/m2  Constitutional:  Alert and oriented, No acute distress. HEENT: [place] AT, moist mucus membranes.  Trachea midline, no masses. Cardiovascular: No clubbing, cyanosis, or edema. Respiratory: Normal respiratory effort, no increased work of breathing. GI: Abdomen is soft, nontender, nondistended, no abdominal masses GU: No CVA tenderness. Circumcised phallus and testicles descended bilaterally without masses or tenderness DRE: +2-3 prostate, slightly firm, no nodularity Skin: No rashes, bruises or suspicious lesions. Lymph: No cervical or inguinal adenopathy. Neurologic: Grossly intact, no focal deficits, moving all 4 extremities.  Psychiatric: Normal mood and affect.  Laboratory Data: Lab Results  Component Value Date   WBC 4.1 11/04/2014   HGB 11.6* 11/04/2014   HCT 36.8* 11/04/2014   MCV 62.6* 11/04/2014   PLT 165 11/04/2014    Lab Results  Component Value Date   CREATININE 0.81 11/04/2014    No results found for: PSA  No results found for: TESTOSTERONE  No results found for: HGBA1C  Urinalysis    Component Value Date/Time   COLORURINE Straw 08/11/2014 1230   APPEARANCEUR Clear 08/11/2014 1230   LABSPEC 1.006 08/11/2014 1230   PHURINE 7.0 08/11/2014 1230   GLUCOSEU Negative 08/11/2014 1230   HGBUR Negative 08/11/2014 1230   BILIRUBINUR Negative 08/11/2014 1230   KETONESUR Negative 08/11/2014 1230   PROTEINUR Negative 08/11/2014 1230   NITRITE Negative 08/11/2014 1230   LEUKOCYTESUR Negative 08/11/2014 1230    Pertinent Imaging:  Assessment & Plan:    1. BPH (benign prostatic hyperplasia)- Urinary symptoms have previously been well controlled on Rapaflo but patient states his symptoms have worsened over the last 5-6 months.  - Urinalysis, Complete - BLADDER SCAN AMB NON-IMAGING - PSA  2. Urinary frequency- Daytime frequency every 1-2 hours and nocturia every hour nightly. He was brought well controlled on Rapaflo now with  worsening symptoms. Will try tamsulosin for a few weeks and see if symptoms improve. Prostate not significantly enlarged on exam but we may try a trial of finasteride in the future. Patient may also benefit from cystoscopy to evaluate bladder outlet and/or a trial of anti-cholinergics for treatment of possible OAB.  3. Prostate Cancer Screening-  Small slightly firm smooth prostate on exam. PSA drawn today. Patient does have a family history significant for prostate cancer in his father.  Return in about 2 weeks (around 09/23/2015).  Herbert Moors, [facility] [address] [place], [place] [address] [phone]

## 2015-09-10 LAB — PSA: Prostate Specific Ag, Serum: 3.2 ng/mL (ref 0.0–4.0)

## 2015-09-14 LAB — URINALYSIS, COMPLETE
Bilirubin, UA: NEGATIVE
Glucose, UA: NEGATIVE
Ketones, UA: NEGATIVE
Leukocytes, UA: NEGATIVE
Nitrite, UA: NEGATIVE
Protein, UA: NEGATIVE
Specific Gravity, UA: 1.01 (ref 1.005–1.030)
Urobilinogen, Ur: 0.2 mg/dL (ref 0.2–1.0)
pH, UA: 5.5 (ref 5.0–7.5)

## 2015-09-14 LAB — MICROSCOPIC EXAMINATION
Epithelial Cells (non renal): NONE SEEN /hpf (ref 0–10)
Renal Epithel, UA: NONE SEEN /hpf

## 2015-09-15 ENCOUNTER — Telehealth: Payer: Self-pay | Admitting: Obstetrics and Gynecology

## 2015-09-15 NOTE — Telephone Encounter (Signed)
Patient notified and stated he previously saw Dr. Bernardo Heater and will sign a release when he is here next week to get results

## 2015-09-15 NOTE — Telephone Encounter (Signed)
-----   Message from Roda Shutters, Yaak sent at 09/15/2015 12:54 PM EDT ----- Please notify patient that his PSA was 3.2 which is within normal range for his age. I do not have access to any of his previous PSAs drawn by his primary care provider to compare this to. We please request previous PSAs from his PCP. Thanks

## 2015-09-17 ENCOUNTER — Ambulatory Visit: Payer: BLUE CROSS/BLUE SHIELD

## 2015-09-17 ENCOUNTER — Telehealth: Payer: Self-pay | Admitting: Family Medicine

## 2015-09-22 ENCOUNTER — Ambulatory Visit (INDEPENDENT_AMBULATORY_CARE_PROVIDER_SITE_OTHER): Payer: BLUE CROSS/BLUE SHIELD | Admitting: Obstetrics and Gynecology

## 2015-09-22 ENCOUNTER — Encounter: Payer: Self-pay | Admitting: Obstetrics and Gynecology

## 2015-09-22 VITALS — BP 148/94 | HR 93 | Resp 16 | Ht 66.0 in | Wt 274.5 lb

## 2015-09-22 DIAGNOSIS — N400 Enlarged prostate without lower urinary tract symptoms: Secondary | ICD-10-CM | POA: Diagnosis not present

## 2015-09-22 LAB — MICROSCOPIC EXAMINATION
Bacteria, UA: NONE SEEN
Epithelial Cells (non renal): NONE SEEN /hpf (ref 0–10)
RBC, UA: NONE SEEN /hpf (ref 0–?)
WBC, UA: NONE SEEN /hpf (ref 0–?)

## 2015-09-22 LAB — URINALYSIS, COMPLETE
Bilirubin, UA: NEGATIVE
Glucose, UA: NEGATIVE
Ketones, UA: NEGATIVE
Leukocytes, UA: NEGATIVE
Nitrite, UA: NEGATIVE
Protein, UA: NEGATIVE
Specific Gravity, UA: 1.025 (ref 1.005–1.030)
Urobilinogen, Ur: 0.2 mg/dL (ref 0.2–1.0)
pH, UA: 5.5 (ref 5.0–7.5)

## 2015-09-22 LAB — BLADDER SCAN AMB NON-IMAGING

## 2015-09-22 NOTE — Progress Notes (Signed)
09/22/2015 7:50 PM   Justin Soto 06/29/1961 [id number]  Referring provider: Jerrol Banana., MD [address] [place], [place] [address]  Chief Complaint  Patient presents with  . Follow-up  . Benign Prostatic Hypertrophy    HPI: Patient presents today for follow-up on nocturia. He was switched from Rapaflo to Flomax last visit. He reports only slight improvement in nocturia. He continues to void 3-4 times per night. He reports a strong stream and does not feel that he has difficulty emptying his bladder. He does experience occasional daytime hesitancy but daytime frequency has improved.  He has a sleep study scheduled the 27th.   Previous Complaint History: Patient is a 54 year old male presenting today with complaints of urinary frequency and nocturia increasing over the last month. Patient states he experienced similar symptoms for years ago and he was started on Rapaflo and his symptoms resolved. Patient states he still is taking Rapaflo but symptoms are continuing to worsen. He is especially bothered by nocturia every hour nightly. Daytime frequency every 1-2 hours with small frequent voids.   Patient's primary care provider is scheduling a sleep study for him.  Family medical history significant for father with prostate cancer.  Patient states that he does get his yearly physicals with Dr. Rosanna Randy including a DRE and PSA though he has not had his physical this year because he had recent knee surgery.  I-PSS 27 QOL 6   PMH: Past Medical History  Diagnosis Date  . Hypertension   . Irregular heart beat     Pt feels this every once in a while; PCP aware but stated it was OK  . Tick fever   . Neck pain of over 3 months duration   . Frequency of urination   . Enlarged prostate     hx of  . History of kidney stones   . Environmental and seasonal allergies   . Leg pain, right     Take Lyrica and Naproxen  . HOH (hard of hearing)     wears bilateral  hearing aids  . Sleep apnea     NO CPAP  . GERD (gastroesophageal reflux disease)   . Anemia     Surgical History: Past Surgical History  Procedure Laterality Date  . Back surgery      Lumbar X 2  . Rotator cuff repair Left   . Leg surgery Right     Had surgery on right calf X 5  . Colonoscopy    . Hernia repair Left     groin  . Tonsillectomy    . Anterior cervical decomp/discectomy fusion N/A 11/13/2014    Procedure: Cervical three-four, Cervical six-seven anterior cervical decompression with fusion interbody prosthesis plating and bonegraft;  Surgeon: Newman Pies, MD;  Location: [facility];  Service: Neurosurgery;  Laterality: N/A;  Cervical three-four, Cervical six-seven anterior cervical decompression with fusion interbody prosthesis plating and bonegraft  . Lithotripsy    . Quadriceps tendon repair Left 06/22/2015    Procedure: REPAIR QUADRICEP TENDON;  Surgeon: Earnestine Leys, MD;  Location: [facility];  Service: Orthopedics;  Laterality: Left;  . Quadriceps tendon repair Left 08/12/2015    Procedure: REPAIR QUADRICEP TENDON;  Surgeon: Earnestine Leys, MD;  Location: [facility];  Service: Orthopedics;  Laterality: Left;    Home Medications:    Medication List       This list is accurate as of: 09/22/15  7:50 PM.  Always use your most recent med list.  amLODipine-benazepril 10-20 MG capsule  Commonly known as:  LOTREL  Take 1 capsule by mouth every morning.     aspirin EC 81 MG tablet  Take 81 mg by mouth daily.     cetirizine 10 MG tablet  Commonly known as:  ZYRTEC  Take 1 tablet (10 mg total) by mouth daily.     DSS 100 MG Caps  Take 100 mg by mouth 2 (two) times daily.     fluticasone 50 MCG/ACT nasal spray  Commonly known as:  FLONASE  Place into the nose.     gabapentin 400 MG capsule  Commonly known as:  NEURONTIN  Take 1 capsule (400 mg total) by mouth 3 (three) times daily.     meloxicam 15 MG tablet  Commonly known as:  MOBIC  Take 1  tablet (15 mg total) by mouth daily.     montelukast 10 MG tablet  Commonly known as:  SINGULAIR  Take 10 mg by mouth every morning.     multivitamin with minerals Tabs tablet  Take 1 tablet by mouth daily.     omeprazole 10 MG capsule  Commonly known as:  PRILOSEC  Take 10 mg by mouth daily.     oxyCODONE-acetaminophen 10-325 MG tablet  Commonly known as:  PERCOCET  Take 1 tablet by mouth every 6 (six) hours as needed for pain.     pregabalin 300 MG capsule  Commonly known as:  LYRICA  Take 1 capsule (300 mg total) by mouth 2 (two) times daily.     tamsulosin 0.4 MG Caps capsule  Commonly known as:  FLOMAX  Take 2 capsules (0.8 mg total) by mouth daily.        Allergies:  Allergies  Allergen Reactions  . Methocarbamol Other (See Comments)    Numbness, and change of taste   . Tizanidine Nausea Only    Family History: Family History  Problem Relation Age of Onset  . Prostate cancer Father     Social History:  reports that he has never smoked. He does not have any smokeless tobacco history on file. He reports that he does not drink alcohol or use illicit drugs.  ROS: UROLOGY Frequent Urination?: Yes Hard to postpone urination?: Yes Burning/pain with urination?: No Get up at night to urinate?: No Leakage of urine?: No Urine stream starts and stops?: No Trouble starting stream?: No Do you have to strain to urinate?: No Blood in urine?: No Urinary tract infection?: No Sexually transmitted disease?: No Injury to kidneys or bladder?: No Painful intercourse?: No Weak stream?: Yes Erection problems?: No Penile pain?: No  Gastrointestinal Nausea?: No Vomiting?: No Indigestion/heartburn?: No Diarrhea?: No Constipation?: No  Constitutional Fever: No Night sweats?: No Weight loss?: No Fatigue?: No  Skin Skin rash/lesions?: No Itching?: No  Eyes Blurred vision?: No Double vision?: No  Ears/Nose/Throat Sore throat?: No Sinus problems?:  No  Hematologic/Lymphatic Swollen glands?: No Easy bruising?: No  Cardiovascular Leg swelling?: No Chest pain?: No  Respiratory Cough?: No Shortness of breath?: No  Endocrine Excessive thirst?: No  Musculoskeletal Back pain?: No Joint pain?: No  Neurological Headaches?: No Dizziness?: No  Psychologic Depression?: No Anxiety?: No  Physical Exam: BP 148/94 mmHg  Pulse 93  Resp 16  Ht 5\' 6"  (1.676 m)  Wt 274 lb 8 oz (124.512 kg)  BMI 44.33 kg/m2  Constitutional:  Alert and oriented, No acute distress. HEENT: [place] AT, moist mucus membranes.  Trachea midline, no masses. Cardiovascular: No clubbing, cyanosis, or edema.  Respiratory: Normal respiratory effort, no increased work of breathing. Skin: No rashes, bruises or suspicious lesions. Lymph: No cervical or inguinal adenopathy. Neurologic: Grossly intact, no focal deficits, moving all 4 extremities. Psychiatric: Normal mood and affect.  Laboratory Data: Lab Results  Component Value Date   WBC 4.1 11/04/2014   HGB 11.6* 11/04/2014   HCT 36.8* 11/04/2014   MCV 62.6* 11/04/2014   PLT 165 11/04/2014    Lab Results  Component Value Date   CREATININE 0.81 11/04/2014    Lab Results  Component Value Date   PSA 3.2 09/09/2015    No results found for: TESTOSTERONE  No results found for: HGBA1C  Urinalysis    Component Value Date/Time   COLORURINE Straw 08/11/2014 1230   APPEARANCEUR Clear 08/11/2014 1230   LABSPEC 1.006 08/11/2014 1230   PHURINE 7.0 08/11/2014 1230   GLUCOSEU Negative 09/22/2015 0904   GLUCOSEU Negative 08/11/2014 1230   HGBUR Negative 08/11/2014 1230   BILIRUBINUR Negative 09/22/2015 0904   BILIRUBINUR Negative 08/11/2014 1230   KETONESUR Negative 08/11/2014 1230   PROTEINUR Negative 08/11/2014 1230   NITRITE Negative 09/22/2015 0904   NITRITE Negative 08/11/2014 1230   LEUKOCYTESUR Negative 09/22/2015 0904   LEUKOCYTESUR Negative 08/11/2014 1230    Pertinent  Imaging:  Assessment & Plan:    1. BPH (benign prostatic hyperplasia)- Patient reports nocturia 3-4 times per night and is significantly bothered by this. He has a sleep study scheduled the 27th. If symptoms persist after treatment of possible sleep apnea we will persue further workup with cystoscopy and/or add trial of finasteride. - Urinalysis, Complete - BLADDER SCAN AMB NON-IMAGING  Return in about 6 weeks (around 11/03/2015).  Herbert Moors, [facility] [address] [place], [place] [address] [phone]

## 2015-10-01 ENCOUNTER — Ambulatory Visit: Payer: BLUE CROSS/BLUE SHIELD | Attending: Otolaryngology

## 2015-10-01 DIAGNOSIS — R0683 Snoring: Secondary | ICD-10-CM | POA: Diagnosis not present

## 2015-10-01 DIAGNOSIS — G4733 Obstructive sleep apnea (adult) (pediatric): Secondary | ICD-10-CM | POA: Diagnosis present

## 2015-10-01 DIAGNOSIS — G4761 Periodic limb movement disorder: Secondary | ICD-10-CM | POA: Insufficient documentation

## 2015-10-26 ENCOUNTER — Ambulatory Visit (INDEPENDENT_AMBULATORY_CARE_PROVIDER_SITE_OTHER): Payer: BLUE CROSS/BLUE SHIELD | Admitting: Family Medicine

## 2015-10-26 VITALS — BP 134/82 | HR 108 | Temp 99.5°F | Resp 20 | Wt 278.0 lb

## 2015-10-26 DIAGNOSIS — J069 Acute upper respiratory infection, unspecified: Secondary | ICD-10-CM | POA: Diagnosis not present

## 2015-10-26 MED ORDER — LEVALBUTEROL HCL 1.25 MG/3ML IN NEBU
1.2500 mg | INHALATION_SOLUTION | Freq: Once | RESPIRATORY_TRACT | Status: AC
  Administered 2015-10-26: 1.25 mg via RESPIRATORY_TRACT

## 2015-10-26 MED ORDER — ALBUTEROL SULFATE HFA 108 (90 BASE) MCG/ACT IN AERS: 2.0000 | INHALATION_SPRAY | Freq: Once | RESPIRATORY_TRACT | Status: DC

## 2015-10-26 MED ORDER — AZITHROMYCIN 250 MG PO TABS: 250.0000 mg | ORAL_TABLET | Freq: Every day | ORAL | Status: DC

## 2015-10-26 MED ORDER — PREDNISONE 10 MG PO TABS: 10.0000 mg | ORAL_TABLET | Freq: Every day | ORAL | Status: DC

## 2015-10-26 NOTE — Progress Notes (Signed)
Patient ID: Justin Soto, male   DOB: 12-22-1960, 54 y.o.   MRN: [id number]   Justin Soto  MRN: [id number] DOB: May 16, 1961  Subjective:  HPI  1. Upper respiratory infection Patient is a 54 year old male who presents for evaluation of upper respiratory infection.  He states he had symptoms starting one week ago and this weekend he got much worse.  He is having difficulty breathing, wheezing, fever, cough, head and chest congestion.  He was given a breathing treatment on Saturday by his paramedics and he notes that he did feel better after the treatment. Pt  is tight and wheezing.  Patient Active Problem List   Diagnosis Date Noted  . ADD (attention deficit disorder) 09/08/2015  . Absolute anemia 09/08/2015  . Achilles bursitis 09/08/2015  . Back pain, thoracic 09/08/2015  . Benign prostatic hypertrophy with lower urinary tract symptoms (LUTS) 09/08/2015  . Cellulitis 09/08/2015  . Clinical depression 09/08/2015  . Deep vein thrombosis ([place]) 09/08/2015  . Acid reflux 09/08/2015  . Bulge of cervical disc without myelopathy 09/08/2015  . 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 BP (high blood pressure) 09/08/2015  . Cannot sleep 09/08/2015  . Anemia, iron deficiency 09/08/2015  . Bad memory 09/08/2015  . Adiposity 09/08/2015  . Allergic rhinitis, seasonal 09/08/2015  . 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 Fast heart beat 09/08/2015  . Cervical spondylosis with myelopathy and radiculopathy 11/13/2014  . Hernia, inguinal, unilateral 04/06/2009    Past Medical History  Diagnosis Date  . Hypertension   . Irregular heart beat     Pt feels this every once in a while; PCP aware but stated it was OK  . Tick fever   . Neck pain of over 3 months duration   . Frequency of urination   . Enlarged prostate     hx of  . History of kidney stones   . Environmental and seasonal allergies   . Leg pain, right     Take Lyrica and Naproxen    . HOH (hard of hearing)     wears bilateral hearing aids  . Sleep apnea     NO CPAP  . GERD (gastroesophageal reflux disease)   . Anemia     Social History   Social History  . Marital Status: Married    Spouse Name: N/A  . Number of Children: N/A  . Years of Education: N/A   Occupational History  . Not on file.   Social History Main Topics  . Smoking status: Never Smoker   . Smokeless tobacco: Not on file  . Alcohol Use: No  . Drug Use: No  . Sexual Activity: Not on file   Other Topics Concern  . Not on file   Social History Narrative    Outpatient Prescriptions Prior to Visit  Medication Sig Dispense Refill  . amLODipine-benazepril (LOTREL) 10-20 MG per capsule Take 1 capsule by mouth every morning. 30 capsule 5  . aspirin EC 81 MG tablet Take 81 mg by mouth daily.    . cetirizine (ZYRTEC) 10 MG tablet Take 1 tablet (10 mg total) by mouth daily. 30 tablet 12  . fluticasone (FLONASE) 50 MCG/ACT nasal spray Place into the nose.    . gabapentin (NEURONTIN) 400 MG capsule Take 1 capsule (400 mg total) by mouth 3 (three) times daily. 60 capsule 3  . montelukast (SINGULAIR) 10 MG tablet Take 10 mg by mouth every morning.     Marland Kitchen  Multiple Vitamin (MULTIVITAMIN WITH MINERALS) TABS tablet Take 1 tablet by mouth daily.    Marland Kitchen omeprazole (PRILOSEC) 10 MG capsule Take 10 mg by mouth daily.    Marland Kitchen oxyCODONE-acetaminophen (PERCOCET) 10-325 MG per tablet Take 1 tablet by mouth every 6 (six) hours as needed for pain. 75 tablet 0  . pregabalin (LYRICA) 300 MG capsule Take 1 capsule (300 mg total) by mouth 2 (two) times daily. 60 capsule 5  . tamsulosin (FLOMAX) 0.4 MG CAPS capsule Take 2 capsules (0.8 mg total) by mouth daily. 60 capsule 2  . docusate sodium 100 MG CAPS Take 100 mg by mouth 2 (two) times daily. 60 capsule 0  . meloxicam (MOBIC) 15 MG tablet Take 1 tablet (15 mg total) by mouth daily. (Patient not taking: Reported on 10/26/2015) 30 tablet 3   No facility-administered  medications prior to visit.    Allergies  Allergen Reactions  . Methocarbamol Other (See Comments)    Numbness, and change of taste   . Tizanidine Nausea Only    Review of Systems  Constitutional: Positive for fever, chills, malaise/fatigue and diaphoresis.  HENT: Positive for congestion, sore throat and tinnitus. Negative for ear discharge, ear pain, hearing loss and nosebleeds.   Eyes: Negative for blurred vision, double vision, photophobia, pain, discharge and redness.  Respiratory: Positive for cough, sputum production, shortness of breath and wheezing. Negative for hemoptysis and stridor.   Cardiovascular: Positive for orthopnea. Negative for chest pain, palpitations, leg swelling and PND.       Tight  Gastrointestinal: Negative.   Neurological: Negative.   Endo/Heme/Allergies: Negative.   Psychiatric/Behavioral: Negative.    Objective:  BP 134/82 mmHg  Pulse 108  Temp(Src) 99.5 F (37.5 C) (Oral)  Resp 20  Wt 278 lb (126.1 kg)  Physical Exam  Constitutional: He is oriented to person, place, and time and well-developed, well-nourished, and in no distress.  HENT:  Head: Normocephalic and atraumatic.  Right Ear: External ear normal.  Left Ear: External ear normal.  Nose: Nose normal.  Eyes: Conjunctivae are normal.  Neck: Neck supple.  Cardiovascular: Normal rate, regular rhythm and normal heart sounds.   Pulmonary/Chest: Effort normal and breath sounds normal.  Diffuse wheezing which actually became louder after nebulizer treatment. I think he was he was moving more air. O2 sat was 91-92% as patient left.  Abdominal: Soft.  Neurological: He is alert and oriented to person, place, and time.  Skin: Skin is warm and dry.  Psychiatric: Mood, memory, affect and judgment normal.    Assessment and Plan :  Upper respiratory infection  Asthmatic bronchitis Patient is given IM Depo-Medrol today. He continues steroids and antibiotics. Only uses inhaler every 3 hours cc  albuterol). Told him to go to the ED if he worsens at all. He is very close to needing admission IV fluids and IV steroids and around-the-clock nebulizer Miguel Aschoff MD [facility] 10/26/2015 4:07 PM

## 2015-10-29 ENCOUNTER — Ambulatory Visit (INDEPENDENT_AMBULATORY_CARE_PROVIDER_SITE_OTHER): Payer: BLUE CROSS/BLUE SHIELD | Admitting: Family Medicine

## 2015-10-29 ENCOUNTER — Encounter: Payer: Self-pay | Admitting: Family Medicine

## 2015-10-29 ENCOUNTER — Ambulatory Visit
Admission: RE | Admit: 2015-10-29 | Discharge: 2015-10-29 | Disposition: A | Discharge status: Home / Self Care | Payer: BLUE CROSS/BLUE SHIELD | Source: Ambulatory Visit | Attending: Family Medicine | Admitting: Family Medicine

## 2015-10-29 VITALS — BP 164/80 | HR 112 | Temp 98.1°F | Resp 20 | Wt 278.0 lb

## 2015-10-29 DIAGNOSIS — J40 Bronchitis, not specified as acute or chronic: Secondary | ICD-10-CM | POA: Insufficient documentation

## 2015-10-29 DIAGNOSIS — R062 Wheezing: Secondary | ICD-10-CM

## 2015-10-29 DIAGNOSIS — R0602 Shortness of breath: Secondary | ICD-10-CM | POA: Diagnosis not present

## 2015-10-29 MED ORDER — LEVALBUTEROL HCL 1.25 MG/0.5ML IN NEBU
1.2500 mg | INHALATION_SOLUTION | Freq: Once | RESPIRATORY_TRACT | Status: AC
  Administered 2015-10-29: 1.25 mg via RESPIRATORY_TRACT

## 2015-10-29 MED ORDER — METHYLPREDNISOLONE ACETATE 80 MG/ML IJ SUSP
80.0000 mg | Freq: Once | INTRAMUSCULAR | Status: AC
  Administered 2015-10-29: 80 mg via INTRAMUSCULAR

## 2015-10-29 MED ORDER — AMOXICILLIN-POT CLAVULANATE 875-125 MG PO TABS: 1.0000 | ORAL_TABLET | Freq: Two times a day (BID) | ORAL | Status: DC

## 2015-10-29 NOTE — Progress Notes (Signed)
Patient ID: Justin Soto, male   DOB: [birth year], 54 y.o.   MRN: [id number]    Subjective:  HPI Pt was seen on Monday for a URI. He was given prednisone, Z-pak and breathing treatment. He was suppose to have a CXR but he when he left her Monday it was too late, they were closed and he has not felt like going to get it done. He is not feeling better at all.   Prior to Admission medications   Medication Sig Start Date End Date Taking? Authorizing Provider  amLODipine-benazepril (LOTREL) 10-20 MG per capsule Take 1 capsule by mouth every morning. 06/11/15  Yes Aubery Douthat Maceo Pro., MD  aspirin EC 81 MG tablet Take 81 mg by mouth daily.   Yes Historical Provider, MD  cetirizine (ZYRTEC) 10 MG tablet Take 1 tablet (10 mg total) by mouth daily. 05/28/15  Yes Carissa Musick Maceo Pro., MD  fluticasone [facility]) 50 MCG/ACT nasal spray Place into the nose. 04/14/15  Yes Historical Provider, MD  gabapentin (NEURONTIN) 400 MG capsule Take 1 capsule (400 mg total) by mouth 3 (three) times daily. 08/12/15  Yes Earnestine Leys, MD  meloxicam (MOBIC) 15 MG tablet Take 1 tablet (15 mg total) by mouth daily. 06/22/15  Yes Earnestine Leys, MD  montelukast (SINGULAIR) 10 MG tablet Take 10 mg by mouth every morning.    Yes Historical Provider, MD  Multiple Vitamin (MULTIVITAMIN WITH MINERALS) TABS tablet Take 1 tablet by mouth daily.   Yes Historical Provider, MD  omeprazole (PRILOSEC) 10 MG capsule Take 10 mg by mouth daily.   Yes Historical Provider, MD  oxyCODONE-acetaminophen (PERCOCET) 10-325 MG per tablet Take 1 tablet by mouth every 6 (six) hours as needed for pain. 08/12/15  Yes Earnestine Leys, MD  pregabalin (LYRICA) 300 MG capsule Take 1 capsule (300 mg total) by mouth 2 (two) times daily. 09/09/15  Yes Devera Englander Maceo Pro., MD  tamsulosin (FLOMAX) 0.4 MG CAPS capsule Take 2 capsules (0.8 mg total) by mouth daily. 09/09/15  Yes Roda Shutters, FNP    Patient Active Problem List   Diagnosis Date Noted  . ADD  (attention deficit disorder) 09/08/2015  . Absolute anemia 09/08/2015  . Achilles bursitis 09/08/2015  . Back pain, thoracic 09/08/2015  . Benign prostatic hypertrophy with lower urinary tract symptoms (LUTS) 09/08/2015  . Cellulitis 09/08/2015  . Clinical depression 09/08/2015  . Deep vein thrombosis ([place]) 09/08/2015  . Acid reflux 09/08/2015  . Bulge of cervical disc without myelopathy 09/08/2015  . 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 BP (high blood pressure) 09/08/2015  . Cannot sleep 09/08/2015  . Anemia, iron deficiency 09/08/2015  . Bad memory 09/08/2015  . Adiposity 09/08/2015  . Allergic rhinitis, seasonal 09/08/2015  . 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 Fast heart beat 09/08/2015  . Cervical spondylosis with myelopathy and radiculopathy 11/13/2014  . Hernia, inguinal, unilateral 04/06/2009    Past Medical History  Diagnosis Date  . Hypertension   . Irregular heart beat     Pt feels this every once in a while; PCP aware but stated it was OK  . Tick fever   . Neck pain of over 3 months duration   . Frequency of urination   . Enlarged prostate     hx of  . History of kidney stones   . Environmental and seasonal allergies   . Leg pain, right     Take Lyrica and Naproxen  .  HOH (hard of hearing)     wears bilateral hearing aids  . Sleep apnea     NO CPAP  . GERD (gastroesophageal reflux disease)   . Anemia     Social History   Social History  . Marital Status: Married    Spouse Name: N/A  . Number of Children: N/A  . Years of Education: N/A   Occupational History  . Not on file.   Social History Main Topics  . Smoking status: Never Smoker   . Smokeless tobacco: Not on file  . Alcohol Use: No  . Drug Use: No  . Sexual Activity: Not on file   Other Topics Concern  . Not on file   Social History Narrative    Allergies  Allergen Reactions  . Methocarbamol Other (See Comments)     Numbness, and change of taste   . Tizanidine Nausea Only    Review of Systems  Constitutional: Positive for malaise/fatigue.  HENT: Negative.   Eyes: Negative.   Respiratory: Positive for cough, sputum production, shortness of breath and wheezing.   Cardiovascular: Negative.   Gastrointestinal: Negative.   Genitourinary: Negative.   Musculoskeletal: Negative.   Skin: Negative.   Neurological: Negative.   Endo/Heme/Allergies: Negative.   Psychiatric/Behavioral: Negative.      There is no immunization history on file for this patient. Objective:  BP 164/80 mmHg  Pulse 112  Temp(Src) 98.1 F (36.7 C) (Oral)  Resp 20  Wt 278 lb (126.1 kg)  SpO2 95%  Physical Exam  Constitutional: He is oriented to person, place, and time and well-developed, well-nourished, and in no distress.  WNWD WM in minimal respiratory distress.  HENT:  Head: Normocephalic and atraumatic.  Right Ear: External ear normal.  Left Ear: External ear normal.  Nose: Nose normal.  Eyes: Conjunctivae are normal.  Neck: Neck supple.  Cardiovascular: Normal rate, regular rhythm and normal heart sounds.   Pulmonary/Chest: Effort normal and breath sounds normal.   Diffuse expiratory wheezes throughout lung fields which got more pronounced after nebulizer.No use of accessory muscles.  Abdominal: Soft.  Neurological: He is alert and oriented to person, place, and time.  Skin: Skin is warm and dry.  Psychiatric: Mood, memory, affect and judgment normal.    Lab Results  Component Value Date   WBC 4.1 11/04/2014   HGB 11.6* 11/04/2014   HCT 36.8* 11/04/2014   PLT 165 11/04/2014   GLUCOSE 120* 11/04/2014   PSA 3.2 09/09/2015   INR 1.0 08/11/2014    CMP     Component Value Date/Time   NA 140 11/04/2014 1020   NA 138 08/11/2014 1230   K 4.7 11/04/2014 1020   K 4.1 08/11/2014 1230   CL 103 11/04/2014 1020   CL 104 08/11/2014 1230   CO2 26 11/04/2014 1020   CO2 28 08/11/2014 1230   GLUCOSE 120*  11/04/2014 1020   GLUCOSE 104* 08/11/2014 1230   BUN 12 11/04/2014 1020   BUN 11 08/11/2014 1230   CREATININE 0.81 11/04/2014 1020   CREATININE 1.01 08/11/2014 1230   CALCIUM 9.0 11/04/2014 1020   CALCIUM 8.5 08/11/2014 1230   PROT 7.7 08/11/2014 1230   ALBUMIN 4.0 08/11/2014 1230   AST 16 08/11/2014 1230   ALT 28 08/11/2014 1230   ALKPHOS 55 08/11/2014 1230   BILITOT 0.6 08/11/2014 1230   GFRNONAA >90 11/04/2014 1020   GFRNONAA >60 08/11/2014 1230   GFRAA >90 11/04/2014 1020   GFRAA >60 08/11/2014 1230  Assessment and Plan :  1. Shortness of breath  - levalbuterol (XOPENEX) nebulizer solution 1.25 mg; Take 1.25 mg by nebulization once. - methylPREDNISolone acetate (DEPO-MEDROL) injection 80 mg; Inject 1 mL (80 mg total) into the muscle once. - DG Chest 2 View  2. Wheezing Pt actually was moving more air after nebulizer treatment. - levalbuterol (XOPENEX) nebulizer solution 1.25 mg; Take 1.25 mg by nebulization once. - methylPREDNISolone acetate (DEPO-MEDROL) injection 80 mg; Inject 1 mL (80 mg total) into the muscle once. - DG Chest 2 View  3. Bronchitis,Asthmatic Pt very close to needing to be admitted for IVFs and IV meds.Add Augmentin. - methylPREDNISolone acetate (DEPO-MEDROL) injection 80 mg; Inject 1 mL (80 mg total) into the muscle once. - amoxicillin-clavulanate (AUGMENTIN) 875-125 MG tablet; Take 1 tablet by mouth 2 (two) times daily.  Dispense: 14 tablet; Refill: 0 - DG Chest 2 View CXR today  Miguel Aschoff MD [facility] 10/29/2015 3:48 PM

## 2015-11-02 NOTE — Progress Notes (Signed)
Advised  ED 

## 2015-11-03 ENCOUNTER — Ambulatory Visit: Payer: BLUE CROSS/BLUE SHIELD | Admitting: Obstetrics and Gynecology

## 2015-11-11 ENCOUNTER — Ambulatory Visit: Payer: BLUE CROSS/BLUE SHIELD | Admitting: Family Medicine

## 2015-12-01 ENCOUNTER — Ambulatory Visit: Payer: BLUE CROSS/BLUE SHIELD | Admitting: Family Medicine

## 2015-12-09 ENCOUNTER — Other Ambulatory Visit: Payer: Self-pay

## 2015-12-09 DIAGNOSIS — R351 Nocturia: Secondary | ICD-10-CM

## 2015-12-09 MED ORDER — TAMSULOSIN HCL 0.4 MG PO CAPS: 0.8000 mg | ORAL_CAPSULE | Freq: Every day | ORAL | Status: DC

## 2015-12-09 NOTE — Progress Notes (Signed)
Pt called wanting refills on tamsulosin. Pt failed to f/u in 6weeks due to illness. Gave pt a 57mo refill and asked him to f/u. Pt voiced understanding and was transferred to the front to make f/u appt.

## 2015-12-18 ENCOUNTER — Encounter: Payer: Self-pay | Admitting: Obstetrics and Gynecology

## 2015-12-18 ENCOUNTER — Other Ambulatory Visit: Payer: Self-pay

## 2015-12-18 ENCOUNTER — Ambulatory Visit (INDEPENDENT_AMBULATORY_CARE_PROVIDER_SITE_OTHER): Payer: BLUE CROSS/BLUE SHIELD | Admitting: Obstetrics and Gynecology

## 2015-12-18 VITALS — BP 151/97 | HR 97 | Resp 16 | Ht 66.5 in | Wt 288.2 lb

## 2015-12-18 DIAGNOSIS — G4730 Sleep apnea, unspecified: Secondary | ICD-10-CM | POA: Diagnosis not present

## 2015-12-18 DIAGNOSIS — R351 Nocturia: Secondary | ICD-10-CM

## 2015-12-18 DIAGNOSIS — N400 Enlarged prostate without lower urinary tract symptoms: Secondary | ICD-10-CM

## 2015-12-18 MED ORDER — AMLODIPINE BESY-BENAZEPRIL HCL 10-20 MG PO CAPS: 1.0000 | ORAL_CAPSULE | ORAL | Status: DC

## 2015-12-18 MED ORDER — TAMSULOSIN HCL 0.4 MG PO CAPS: 0.8000 mg | ORAL_CAPSULE | Freq: Every day | ORAL | Status: DC

## 2015-12-18 NOTE — Progress Notes (Signed)
9:40 AM   Justin Soto 11-13-1961 [id number]  Referring provider: Jerrol Banana., MD [address] [place], [place] [address]  Chief Complaint  Patient presents with  . Benign Prostatic Hypertrophy  . Follow-up    HPI: Patient is a 55 year old male presenting today follow up on complaints of urinary frequency and nocturia. He was switched from Rapaflo to Flomax at his previous visit and reported only slight improvement in nocturia but did notice a significant improvement in daytime frequency and strength of stream.   He has since undergone a sleep study and was diagnosed with sleep apnea. He currently is sleeping with a CPAP machine and reports nocturia has reduced from 3-4 times per night to 1 or less. He reports a strong stream and does not feel that he has difficulty emptying his bladder. He states that he ran out of his Flomax for a few days and has noticed a significant increase in time urinary symptoms. Resting refills today.  Family medical history significant for father with prostate cancer.   Patient states that he does get his yearly physicals with Dr. Rosanna Randy including a DRE and PSA though he has not had his physical this year because he had recent knee surgery.  I-PSS: 10  previous 27 QOL: 2 mostly satisfied   PMH: Past Medical History  Diagnosis Date  . Hypertension   . Irregular heart beat     Pt feels this every once in a while; PCP aware but stated it was OK  . Tick fever   . Neck pain of over 3 months duration   . Frequency of urination   . Enlarged prostate     hx of  . History of kidney stones   . Environmental and seasonal allergies   . Leg pain, right     Take Lyrica and Naproxen  . HOH (hard of hearing)     wears bilateral hearing aids  . Sleep apnea     NO CPAP  . GERD (gastroesophageal reflux disease)   . Anemia     Surgical History: Past Surgical History  Procedure Laterality Date  . Back surgery      Lumbar X 2  .  Rotator cuff repair Left   . Leg surgery Right     Had surgery on right calf X 5  . Colonoscopy    . Hernia repair Left     groin  . Tonsillectomy    . Anterior cervical decomp/discectomy fusion N/A 11/13/2014    Procedure: Cervical three-four, Cervical six-seven anterior cervical decompression with fusion interbody prosthesis plating and bonegraft;  Surgeon: Newman Pies, MD;  Location: [facility];  Service: Neurosurgery;  Laterality: N/A;  Cervical three-four, Cervical six-seven anterior cervical decompression with fusion interbody prosthesis plating and bonegraft  . Lithotripsy    . Quadriceps tendon repair Left 06/22/2015    Procedure: REPAIR QUADRICEP TENDON;  Surgeon: Earnestine Leys, MD;  Location: [facility];  Service: Orthopedics;  Laterality: Left;  . Quadriceps tendon repair Left 08/12/2015    Procedure: REPAIR QUADRICEP TENDON;  Surgeon: Earnestine Leys, MD;  Location: [facility];  Service: Orthopedics;  Laterality: Left;    Home Medications:    Medication List       This list is accurate as of: 12/18/15  9:40 AM.  Always use your most recent med list.               amLODipine-benazepril 10-20 MG capsule  Commonly known as:  LOTREL  Take 1 capsule by mouth every morning.     aspirin EC 81 MG tablet  Take 81 mg by mouth daily.     cetirizine 10 MG tablet  Commonly known as:  ZYRTEC  Take 1 tablet (10 mg total) by mouth daily.     fluticasone 50 MCG/ACT nasal spray  Commonly known as:  FLONASE  Place into the nose.     montelukast 10 MG tablet  Commonly known as:  SINGULAIR  Take 10 mg by mouth every morning.     multivitamin with minerals Tabs tablet  Take 1 tablet by mouth daily.     omeprazole 10 MG capsule  Commonly known as:  PRILOSEC  Take 10 mg by mouth daily.     pregabalin 300 MG capsule  Commonly known as:  LYRICA  Take 1 capsule (300 mg total) by mouth 2 (two) times daily.     tamsulosin 0.4 MG Caps capsule  Commonly known as:  FLOMAX  Take 2 capsules  (0.8 mg total) by mouth daily.        Allergies:  Allergies  Allergen Reactions  . Methocarbamol Other (See Comments)    Numbness, and change of taste   . Tizanidine Nausea Only    Family History: Family History  Problem Relation Age of Onset  . Prostate cancer Father     Social History:  reports that he has never smoked. He does not have any smokeless tobacco history on file. He reports that he does not drink alcohol or use illicit drugs.  ROS: UROLOGY Frequent Urination?: No Hard to postpone urination?: No Burning/pain with urination?: No Get up at night to urinate?: Yes Leakage of urine?: No Urine stream starts and stops?: No Trouble starting stream?: No Do you have to strain to urinate?: No Blood in urine?: No Urinary tract infection?: No Sexually transmitted disease?: No Injury to kidneys or bladder?: No Painful intercourse?: No Weak stream?: No Erection problems?: No Penile pain?: No  Gastrointestinal Nausea?: No Vomiting?: No Indigestion/heartburn?: No Diarrhea?: No Constipation?: No  Constitutional Fever: No Night sweats?: No Weight loss?: No Fatigue?: No  Skin Skin rash/lesions?: No Itching?: No  Eyes Blurred vision?: No Double vision?: No  Ears/Nose/Throat Sore throat?: No Sinus problems?: No  Hematologic/Lymphatic Swollen glands?: No Easy bruising?: No  Cardiovascular Leg swelling?: No Chest pain?: No  Respiratory Cough?: No Shortness of breath?: No  Endocrine Excessive thirst?: No  Musculoskeletal Back pain?: No Joint pain?: No  Neurological Headaches?: No Dizziness?: No  Psychologic Depression?: No Anxiety?: No  Physical Exam: BP 151/97 mmHg  Pulse 97  Resp 16  Ht 5' 6.5" (1.689 m)  Wt 288 lb 3.2 oz (130.727 kg)  BMI 45.83 kg/m2  Constitutional:  Alert and oriented, No acute distress. HEENT: [place] AT, moist mucus membranes.  Trachea midline, no masses. Cardiovascular: No clubbing, cyanosis, or  edema. Respiratory: Normal respiratory effort, no increased work of breathing. Skin: No rashes, bruises or suspicious lesions. Lymph: No cervical or inguinal adenopathy. Neurologic: Grossly intact, no focal deficits, moving all 4 extremities. Psychiatric: Normal mood and affect.  Laboratory Data: Lab Results  Component Value Date   WBC 4.1 11/04/2014   HGB 11.6* 11/04/2014   HCT 36.8* 11/04/2014   MCV 62.6* 11/04/2014   PLT 165 11/04/2014    Lab Results  Component Value Date   CREATININE 0.81 11/04/2014    Lab Results  Component Value Date   PSA 3.2 09/09/2015    No results found for:  TESTOSTERONE  No results found for: HGBA1C  Pertinent Imaging: Results for orders placed or performed in visit on 09/22/15  Microscopic Examination  Result Value Ref Range   WBC, UA None seen 0 -  5 /hpf   RBC, UA None seen 0 -  2 /hpf   Epithelial Cells (non renal) None seen 0 - 10 /hpf   Bacteria, UA None seen None seen/Few  Urinalysis, Complete  Result Value Ref Range   Specific Gravity, UA 1.025 1.005 - 1.030   pH, UA 5.5 5.0 - 7.5   Color, UA Yellow Yellow   Appearance Ur Clear Clear   Leukocytes, UA Negative Negative   Protein, UA Negative Negative/Trace   Glucose, UA Negative Negative   Ketones, UA Negative Negative   RBC, UA Trace (A) Negative   Bilirubin, UA Negative Negative   Urobilinogen, Ur 0.2 0.2 - 1.0 mg/dL   Nitrite, UA Negative Negative   Microscopic Examination See below:   BLADDER SCAN AMB NON-IMAGING  Result Value Ref Range   Scan Result 30mL      Assessment & Plan:    1. BPH (benign prostatic hyperplasia)-  Daytime urinary symptoms well controlled on Flomax. Nocturia significantly improved since sleep apnea is being treated. Refills for Flomax sent into patient's pharmacy today. He will present in 8 months for recheck DRE and PSA. I have once again requested his previous PSAs from his primary care provider. - Urinalysis, Complete - BLADDER SCAN AMB  NON-IMAGING  2.  Sleep Apnea-  Managed with CPAP.  Nocturia now only 0-1 voids per night.  Return in about 8 months (around 08/17/2016) for PSA/DRE.  Herbert Moors, [facility] [address] [place], [place] [address] [phone]

## 2015-12-29 ENCOUNTER — Ambulatory Visit (INDEPENDENT_AMBULATORY_CARE_PROVIDER_SITE_OTHER): Payer: BLUE CROSS/BLUE SHIELD | Admitting: Family Medicine

## 2015-12-29 VITALS — BP 150/80 | HR 104 | Temp 97.8°F | Resp 18 | Wt 285.0 lb

## 2015-12-29 DIAGNOSIS — R059 Cough, unspecified: Secondary | ICD-10-CM

## 2015-12-29 DIAGNOSIS — R05 Cough: Secondary | ICD-10-CM | POA: Diagnosis not present

## 2015-12-29 DIAGNOSIS — J42 Unspecified chronic bronchitis: Secondary | ICD-10-CM | POA: Diagnosis not present

## 2015-12-29 MED ORDER — PREDNISONE 10 MG PO TABS: 10.0000 mg | ORAL_TABLET | Freq: Every day | ORAL | Status: DC

## 2015-12-29 MED ORDER — AZITHROMYCIN 250 MG PO TABS: ORAL_TABLET | ORAL | Status: DC

## 2015-12-29 NOTE — Progress Notes (Signed)
Patient ID: Justin Soto, male   DOB: [birth year], 55 y.o.   MRN: [id number]   Justin Soto  MRN: [id number] DOB: 13-Jun-1961  Subjective:  HPI   1. Cough The patient is a 55 year old male who presents for evaluation of his cough, sore throat and congestion.  He said that it started on Friday, 5 days ago and has gotten progressively worse.  He states his cough and stopped up nose are the worst symptoms.  Patient Active Problem List   Diagnosis Date Noted  . ADD (attention deficit disorder) 09/08/2015  . Absolute anemia 09/08/2015  . Achilles bursitis 09/08/2015  . Back pain, thoracic 09/08/2015  . Benign prostatic hypertrophy with lower urinary tract symptoms (LUTS) 09/08/2015  . Cellulitis 09/08/2015  . Clinical depression 09/08/2015  . Deep vein thrombosis ([place]) 09/08/2015  . Acid reflux 09/08/2015  . Bulge of cervical disc without myelopathy 09/08/2015  . 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 BP (high blood pressure) 09/08/2015  . Cannot sleep 09/08/2015  . Anemia, iron deficiency 09/08/2015  . Bad memory 09/08/2015  . Adiposity 09/08/2015  . Allergic rhinitis, seasonal 09/08/2015  . 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 Fast heart beat 09/08/2015  . Cervical spondylosis with myelopathy and radiculopathy 11/13/2014  . Hernia, inguinal, unilateral 04/06/2009    Past Medical History  Diagnosis Date  . Hypertension   . Irregular heart beat     Pt feels this every once in a while; PCP aware but stated it was OK  . Tick fever   . Neck pain of over 3 months duration   . Frequency of urination   . Enlarged prostate     hx of  . History of kidney stones   . Environmental and seasonal allergies   . Leg pain, right     Take Lyrica and Naproxen  . HOH (hard of hearing)     wears bilateral hearing aids  . Sleep apnea     NO CPAP  . GERD (gastroesophageal reflux disease)   . Anemia     Social History   Social  History  . Marital Status: Married    Spouse Name: N/A  . Number of Children: N/A  . Years of Education: N/A   Occupational History  . Not on file.   Social History Main Topics  . Smoking status: Never Smoker   . Smokeless tobacco: Not on file  . Alcohol Use: No  . Drug Use: No  . Sexual Activity: Not on file   Other Topics Concern  . Not on file   Social History Narrative    Outpatient Prescriptions Prior to Visit  Medication Sig Dispense Refill  . amLODipine-benazepril (LOTREL) 10-20 MG capsule Take 1 capsule by mouth every morning. 30 capsule 12  . aspirin EC 81 MG tablet Take 81 mg by mouth daily.    . cetirizine (ZYRTEC) 10 MG tablet Take 1 tablet (10 mg total) by mouth daily. 30 tablet 12  . fluticasone (FLONASE) 50 MCG/ACT nasal spray Place into the nose.    . montelukast (SINGULAIR) 10 MG tablet Take 10 mg by mouth every morning.     . Multiple Vitamin (MULTIVITAMIN WITH MINERALS) TABS tablet Take 1 tablet by mouth daily.    Marland Kitchen omeprazole (PRILOSEC) 10 MG capsule Take 10 mg by mouth daily.    . pregabalin (LYRICA) 300 MG capsule Take 1 capsule (300 mg total) by mouth 2 (two)  times daily. 60 capsule 5  . tamsulosin (FLOMAX) 0.4 MG CAPS capsule Take 2 capsules (0.8 mg total) by mouth daily. 30 capsule 12   Facility-Administered Medications Prior to Visit  Medication Dose Route Frequency Provider Last Rate Last Dose  . albuterol (PROVENTIL HFA;VENTOLIN HFA) 108 (90 BASE) MCG/ACT inhaler 2 puff  2 puff Inhalation Once Jerrol Banana., MD      . azithromycin [facility]) tablet 250 mg  250 mg Oral Daily Jerrol Banana., MD      . predniSONE (DELTASONE) tablet 10 mg  10 mg Oral Q breakfast Jerrol Banana., MD        Allergies  Allergen Reactions  . Methocarbamol Other (See Comments)    Numbness, and change of taste   . Tizanidine Nausea Only    Review of Systems  Constitutional: Positive for chills, malaise/fatigue and diaphoresis. Negative for  fever.  HENT: Positive for congestion, ear pain and sore throat. Negative for ear discharge, hearing loss, nosebleeds and tinnitus.   Eyes: Positive for photophobia. Negative for blurred vision, double vision, pain, discharge and redness.  Respiratory: Positive for cough, sputum production and shortness of breath. Negative for hemoptysis, wheezing and stridor.   Cardiovascular: Negative for chest pain, palpitations, orthopnea and leg swelling.  Neurological: Positive for headaches. Negative for weakness.   Objective:  BP 150/80 mmHg  Pulse 104  Temp(Src) 97.8 F (36.6 C) (Oral)  Resp 18  Wt 285 lb (129.275 kg)  Physical Exam  Constitutional: He is oriented to person, place, and time and well-developed, well-nourished, and in no distress.  HENT:  Head: Normocephalic and atraumatic.  Right Ear: External ear normal.  Left Ear: External ear normal.  Nose: Nose normal.  Mouth/Throat: Oropharynx is clear and moist. No oropharyngeal exudate.  Eyes: Conjunctivae and EOM are normal. Pupils are equal, round, and reactive to light.  Neck: Normal range of motion. Neck supple.  Cardiovascular: Normal rate, regular rhythm and normal heart sounds.   Pulmonary/Chest: Effort normal and breath sounds normal.  Abdominal: Soft.  Neurological: He is alert and oriented to person, place, and time. Gait normal.  Skin: Skin is warm and dry.  Psychiatric: Mood, memory, affect and judgment normal.    Assessment and Plan :  Cough  Mild asthmatic bronchitis He had this some months ago and was close to being admitted. We'll go and treat with body aches and short steroid burst.  Miguel Aschoff MD [facility] 12/29/2015 2:56 PM

## 2015-12-31 ENCOUNTER — Encounter: Payer: Self-pay | Admitting: Emergency Medicine

## 2016-01-25 ENCOUNTER — Telehealth: Payer: Self-pay | Admitting: Obstetrics and Gynecology

## 2016-01-25 NOTE — Telephone Encounter (Signed)
Requested records from [facility] on 09/22/2015 But we have never received any records   michelle

## 2016-02-05 ENCOUNTER — Other Ambulatory Visit: Payer: Self-pay | Admitting: Obstetrics and Gynecology

## 2016-02-16 ENCOUNTER — Ambulatory Visit (INDEPENDENT_AMBULATORY_CARE_PROVIDER_SITE_OTHER): Payer: BLUE CROSS/BLUE SHIELD | Admitting: Family Medicine

## 2016-02-16 VITALS — BP 144/78 | HR 100 | Temp 98.4°F | Resp 18 | Wt 292.0 lb

## 2016-02-16 DIAGNOSIS — I10 Essential (primary) hypertension: Secondary | ICD-10-CM

## 2016-02-16 MED ORDER — HYDROCHLOROTHIAZIDE 25 MG PO TABS: 25.0000 mg | ORAL_TABLET | Freq: Every day | ORAL | Status: DC

## 2016-02-16 MED ORDER — AMLODIPINE BESY-BENAZEPRIL HCL 10-40 MG PO CAPS: 1.0000 | ORAL_CAPSULE | Freq: Every day | ORAL | Status: DC

## 2016-02-16 NOTE — Progress Notes (Signed)
Patient ID: Justin Soto, male   DOB: 04/05/1961, 55 y.o.   MRN: [id number]   Justin Soto  MRN: [id number] DOB: Dec 11, 1961  Subjective:  HPI   1. Essential hypertension The patient is a 55 year old who presents today for evaluation of his blood pressure.  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 The BP at the time of the PE was 151/98 and was done on an automated machine.    Patient Active Problem List   Diagnosis Date Noted  . ADD (attention deficit disorder) 09/08/2015  . Absolute anemia 09/08/2015  . Achilles bursitis 09/08/2015  . Back pain, thoracic 09/08/2015  . Benign prostatic hypertrophy with lower urinary tract symptoms (LUTS) 09/08/2015  . Cellulitis 09/08/2015  . Clinical depression 09/08/2015  . Deep vein thrombosis ([place]) 09/08/2015  . Acid reflux 09/08/2015  . Bulge of cervical disc without myelopathy 09/08/2015  . 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 BP (high blood pressure) 09/08/2015  . Cannot sleep 09/08/2015  . Anemia, iron deficiency 09/08/2015  . Bad memory 09/08/2015  . Adiposity 09/08/2015  . Allergic rhinitis, seasonal 09/08/2015  . 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 Fast heart beat 09/08/2015  . Cervical spondylosis with myelopathy and radiculopathy 11/13/2014  . Hernia, inguinal, unilateral 04/06/2009    Past Medical History  Diagnosis Date  . Hypertension   . Irregular heart beat     Pt feels this every once in a while; PCP aware but stated it was OK  . Tick fever   . Neck pain of over 3 months duration   . Frequency of urination   . Enlarged prostate     hx of  . History of kidney stones   . Environmental and seasonal allergies   . Leg pain, right     Take Lyrica and Naproxen  . HOH (hard of hearing)     wears bilateral hearing aids  . Sleep apnea     NO CPAP  . GERD  (gastroesophageal reflux disease)   . Anemia     Social History   Social History  . Marital Status: Married    Spouse Name: N/A  . Number of Children: N/A  . Years of Education: N/A   Occupational History  . Not on file.   Social History Main Topics  . Smoking status: Never Smoker   . Smokeless tobacco: Not on file  . Alcohol Use: No  . Drug Use: No  . Sexual Activity: Not on file   Other Topics Concern  . Not on file   Social History Narrative    Outpatient Prescriptions Prior to Visit  Medication Sig Dispense Refill  . amLODipine-benazepril (LOTREL) 10-20 MG capsule Take 1 capsule by mouth every morning. 30 capsule 12  . aspirin EC 81 MG tablet Take 81 mg by mouth daily.    . cetirizine (ZYRTEC) 10 MG tablet Take 1 tablet (10 mg total) by mouth daily. 30 tablet 12  . fluticasone (FLONASE) 50 MCG/ACT nasal spray Place into the nose.    . montelukast (SINGULAIR) 10 MG tablet Take 10 mg by mouth every morning.     . Multiple Vitamin (MULTIVITAMIN WITH MINERALS) TABS tablet Take 1 tablet by mouth daily.    Marland Kitchen omeprazole (PRILOSEC) 10 MG capsule Take 10 mg by mouth daily.    Marland Kitchen  pregabalin (LYRICA) 300 MG capsule Take 1 capsule (300 mg total) by mouth 2 (two) times daily. 60 capsule 5  . tamsulosin (FLOMAX) 0.4 MG CAPS capsule Take 2 capsules (0.8 mg total) by mouth daily. 30 capsule 12  . azithromycin (ZITHROMAX) 250 MG tablet 2 po day 1 then 1 daily 6 tablet 0  . predniSONE (DELTASONE) 10 MG tablet Take 1 tablet (10 mg total) by mouth daily with breakfast. 21 tablet 0  . tamsulosin (FLOMAX) 0.4 MG CAPS capsule TAKE TWO CAPSULES BY MOUTH EACH DAY AS DIRECTED 60 capsule 0   Facility-Administered Medications Prior to Visit  Medication Dose Route Frequency Provider Last Rate Last Dose  . albuterol (PROVENTIL HFA;VENTOLIN HFA) 108 (90 BASE) MCG/ACT inhaler 2 puff  2 puff Inhalation Once Jerrol Banana., MD        Allergies  Allergen Reactions  . Methocarbamol Other (See  Comments)    Numbness, and change of taste   . Tizanidine Nausea Only    Review of Systems  Constitutional: Negative for fever, chills and malaise/fatigue.  Respiratory: Positive for cough. Negative for shortness of breath and wheezing.   Cardiovascular: Positive for palpitations. Negative for chest pain, orthopnea and leg swelling.  Neurological: Positive for dizziness and headaches. Negative for weakness.   Objective:  BP 144/78 mmHg  Pulse 100  Temp(Src) 98.4 F (36.9 C) (Oral)  Resp 18  Wt 292 lb (132.45 kg)  Physical Exam  Assessment and Plan :   1. Essential hypertension Needs better control for DOT physical. - amLODipine-benazepril (LOTREL) 10-40 MG capsule; Take 1 capsule by mouth daily.  Dispense: 30 capsule; Refill: 12 - hydrochlorothiazide (HYDRODIURIL) 25 MG tablet; Take 1 tablet (25 mg total) by mouth daily.  Dispense: 30 tablet; Refill: 3  I have done the exam and reviewed the above chart and it is accurate to the best of my knowledge.  Miguel Aschoff MD [facility] 02/16/2016 4:32 PM

## 2016-03-08 ENCOUNTER — Ambulatory Visit (INDEPENDENT_AMBULATORY_CARE_PROVIDER_SITE_OTHER): Payer: BLUE CROSS/BLUE SHIELD | Admitting: Family Medicine

## 2016-03-08 VITALS — BP 136/78 | HR 86 | Temp 98.7°F | Resp 14 | Wt 292.0 lb

## 2016-03-08 DIAGNOSIS — I10 Essential (primary) hypertension: Secondary | ICD-10-CM | POA: Diagnosis not present

## 2016-03-08 DIAGNOSIS — E669 Obesity, unspecified: Secondary | ICD-10-CM

## 2016-03-08 DIAGNOSIS — D509 Iron deficiency anemia, unspecified: Secondary | ICD-10-CM | POA: Diagnosis not present

## 2016-03-08 DIAGNOSIS — R739 Hyperglycemia, unspecified: Secondary | ICD-10-CM

## 2016-03-08 DIAGNOSIS — J302 Other seasonal allergic rhinitis: Secondary | ICD-10-CM | POA: Diagnosis not present

## 2016-03-08 DIAGNOSIS — M5020 Other cervical disc displacement, unspecified cervical region: Secondary | ICD-10-CM | POA: Diagnosis not present

## 2016-03-08 DIAGNOSIS — M5030 Other cervical disc degeneration, unspecified cervical region: Secondary | ICD-10-CM

## 2016-03-08 MED ORDER — CETIRIZINE HCL 10 MG PO TABS: ORAL_TABLET | ORAL | Status: DC

## 2016-03-08 MED ORDER — METOPROLOL TARTRATE 25 MG PO TABS: 25.0000 mg | ORAL_TABLET | Freq: Two times a day (BID) | ORAL | Status: DC

## 2016-03-08 MED ORDER — IBUPROFEN 800 MG PO TABS: 800.0000 mg | ORAL_TABLET | Freq: Three times a day (TID) | ORAL | Status: DC | PRN

## 2016-03-08 NOTE — Progress Notes (Signed)
Patient ID: Justin Soto, male   DOB: [birth year], 55 y.o.   MRN: [id number]    Subjective:  HPI  Patient is here for follow up on b/p. His last visit was [facility] and Lotrel dose was increased and HCTZ 25 mg was added. His readings have improved on the bottom part but top numbers have not changed: 152/86, 150/92. No side effects to the medication that he knows of. BP Readings from Last 3 Encounters:  03/08/16 136/78  02/16/16 144/78  04/30/15 164/92    Prior to Admission medications   Medication Sig Start Date End Date Taking? Authorizing Provider  amLODipine-benazepril (LOTREL) 10-40 MG capsule Take 1 capsule by mouth daily. 02/16/16  Yes Leilanee Righetti Maceo Pro., MD  aspirin EC 81 MG tablet Take 81 mg by mouth daily.   Yes Historical Provider, MD  cetirizine (ZYRTEC) 10 MG tablet Take 1 tablet (10 mg total) by mouth daily. 05/28/15  Yes Adrieanna Boteler Maceo Pro., MD  fluticasone [facility]) 50 MCG/ACT nasal spray Place into the nose. 04/14/15  Yes Historical Provider, MD  hydrochlorothiazide (HYDRODIURIL) 25 MG tablet Take 1 tablet (25 mg total) by mouth daily. 02/16/16  Yes Hermenegildo Clausen Maceo Pro., MD  montelukast (SINGULAIR) 10 MG tablet Take 10 mg by mouth every morning.    Yes Historical Provider, MD  Multiple Vitamin (MULTIVITAMIN WITH MINERALS) TABS tablet Take 1 tablet by mouth daily.   Yes Historical Provider, MD  omeprazole (PRILOSEC) 10 MG capsule Take 10 mg by mouth daily.   Yes Historical Provider, MD  pregabalin (LYRICA) 300 MG capsule Take 1 capsule (300 mg total) by mouth 2 (two) times daily. 09/09/15  Yes Dolph Tavano Maceo Pro., MD  tamsulosin (FLOMAX) 0.4 MG CAPS capsule Take 2 capsules (0.8 mg total) by mouth daily. 12/18/15  Yes Roda Shutters, FNP    Patient Active Problem List   Diagnosis Date Noted  . ADD (attention deficit disorder) 09/08/2015  . Absolute anemia 09/08/2015  . Achilles bursitis 09/08/2015  . Back pain, thoracic 09/08/2015  . Benign prostatic hypertrophy with  lower urinary tract symptoms (LUTS) 09/08/2015  . Cellulitis 09/08/2015  . Clinical depression 09/08/2015  . Deep vein thrombosis ([place]) 09/08/2015  . Acid reflux 09/08/2015  . Bulge of cervical disc without myelopathy 09/08/2015  . 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 BP (high blood pressure) 09/08/2015  . Cannot sleep 09/08/2015  . Anemia, iron deficiency 09/08/2015  . Bad memory 09/08/2015  . Adiposity 09/08/2015  . Allergic rhinitis, seasonal 09/08/2015  . 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 Fast heart beat 09/08/2015  . Cervical spondylosis with myelopathy and radiculopathy 11/13/2014  . Hernia, inguinal, unilateral 04/06/2009    Past Medical History  Diagnosis Date  . Hypertension   . Irregular heart beat     Pt feels this every once in a while; PCP aware but stated it was OK  . Tick fever   . Neck pain of over 3 months duration   . Frequency of urination   . Enlarged prostate     hx of  . History of kidney stones   . Environmental and seasonal allergies   . Leg pain, right     Take Lyrica and Naproxen  . HOH (hard of hearing)     wears bilateral hearing aids  . Sleep apnea     NO CPAP  . GERD (gastroesophageal reflux disease)   . Anemia  Social History   Social History  . Marital Status: Married    Spouse Name: N/A  . Number of Children: N/A  . Years of Education: N/A   Occupational History  . Not on file.   Social History Main Topics  . Smoking status: Never Smoker   . Smokeless tobacco: Not on file  . Alcohol Use: No  . Drug Use: No  . Sexual Activity: Not on file   Other Topics Concern  . Not on file   Social History Narrative    Allergies  Allergen Reactions  . Methocarbamol Other (See Comments)    Numbness, and change of taste   . Tizanidine Nausea Only    Review of Systems  Constitutional: Negative.   Respiratory: Negative.   Cardiovascular: Negative.   Musculoskeletal:  Positive for back pain and joint pain.  Neurological: Negative.     Immunization History  Administered Date(s) Administered  . Tdap 01/03/2013   Objective:  BP 136/78 mmHg  Pulse 86  Temp(Src) 98.7 F (37.1 C)  Resp 14  Wt 292 lb (132.45 kg)  SpO2 95%  Physical Exam  Constitutional: He is oriented to person, place, and time and well-developed, well-nourished, and in no distress.  HENT:  Head: Normocephalic and atraumatic.  Right Ear: External ear normal.  Left Ear: External ear normal.  Nose: Nose normal.  Eyes: Conjunctivae are normal. Pupils are equal, round, and reactive to light.  Cardiovascular: Normal rate, regular rhythm, normal heart sounds and intact distal pulses.   No murmur heard. No epigastric bruit.  Pulmonary/Chest: Effort normal and breath sounds normal. No respiratory distress. He has no wheezes.  Abdominal: Soft.  Neurological: He is alert and oriented to person, place, and time. Gait normal. GCS score is 15.  Skin: Skin is dry.  Psychiatric: Mood, memory, affect and judgment normal.    Lab Results  Component Value Date   WBC 4.1 11/04/2014   HGB 11.6* 11/04/2014   HCT 36.8* 11/04/2014   PLT 165 11/04/2014   GLUCOSE 120* 11/04/2014   CHOL 134 05/16/2014   TRIG 114 05/16/2014   HDL 28* 05/16/2014   LDLCALC 83 05/16/2014   TSH 1.55 07/14/2014   PSA 3.2 05/16/2014   INR 1.0 08/11/2014    CMP     Component Value Date/Time   NA 140 11/04/2014 1020   NA 138 08/11/2014 1230   NA 140 07/14/2014   K 4.7 11/04/2014 1020   K 4.1 08/11/2014 1230   CL 103 11/04/2014 1020   CL 104 08/11/2014 1230   CO2 26 11/04/2014 1020   CO2 28 08/11/2014 1230   GLUCOSE 120* 11/04/2014 1020   GLUCOSE 104* 08/11/2014 1230   BUN 12 11/04/2014 1020   BUN 11 08/11/2014 1230   BUN 13 07/14/2014   CREATININE 0.81 11/04/2014 1020   CREATININE 1.01 08/11/2014 1230   CREATININE 0.8 07/14/2014   CALCIUM 9.0 11/04/2014 1020   CALCIUM 8.5 08/11/2014 1230   PROT 7.7  08/11/2014 1230   ALBUMIN 4.0 08/11/2014 1230   AST 16 08/11/2014 1230   AST 18 07/14/2014   ALT 28 08/11/2014 1230   ALT 20 07/14/2014   ALKPHOS 55 08/11/2014 1230   ALKPHOS 66 07/14/2014   BILITOT 0.6 08/11/2014 1230   GFRNONAA >90 11/04/2014 1020   GFRNONAA >60 08/11/2014 1230   GFRAA >90 11/04/2014 1020   GFRAA >60 08/11/2014 1230    Assessment and Plan :  1. Essential hypertension B/P today better from 3 weeks  ago. Patient is getting higher readings but it could be due to cuff size he has been using. 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 And then will re check on the next visit. Advised of possible bradycardia. This dose should not be an issue with a heart rate today resting at 86 2. Anemia, iron deficiency Check labs its been 2 years. - CBC with Differential/Platelet - TSH  3. Adiposity Patient knows he needs to work on his habits. Pending lab results. - Lipid Panel With LDL/HDL Ratio - TSH - Comprehensive metabolic panel  4. Bulge of cervical disc without myelopathy Chronic back pain. RX for Ibuprofen provided. Advised patient this medication if taking regularly can cause ulcers and elevate his B/P. Advised patient to wait and not take this medicaiton until his DOT check up. Patient states he takes this medication rarely and just wants to have it on hand just in case.  5. Seasonal allergies Worsening. even with using Zyrtec and Singulair. Advised patient we will send RX for Zyrtec with directions of 1 to 2 tablets daily for better control but advised patient insurance may not cover it this way. - cetirizine (ZYRTEC) 10 MG tablet; 1 to 2 tablets daily as needed  Dispense: 60 tablet; Refill: 12  6. Hyperglycemia - HgB A1c  Patient was seen and examined by Dr. Eulas Post and note was scribed by Theressa Millard, RMA.   Miguel Aschoff MD [facility] 03/08/2016 4:02 PM

## 2016-03-15 ENCOUNTER — Ambulatory Visit (INDEPENDENT_AMBULATORY_CARE_PROVIDER_SITE_OTHER): Payer: BLUE CROSS/BLUE SHIELD | Admitting: Physician Assistant

## 2016-03-15 ENCOUNTER — Encounter: Payer: Self-pay | Admitting: Physician Assistant

## 2016-03-15 VITALS — BP 148/80 | HR 92 | Temp 98.8°F | Resp 16 | Wt 288.8 lb

## 2016-03-15 DIAGNOSIS — J029 Acute pharyngitis, unspecified: Secondary | ICD-10-CM | POA: Diagnosis not present

## 2016-03-15 DIAGNOSIS — J0140 Acute pansinusitis, unspecified: Secondary | ICD-10-CM | POA: Diagnosis not present

## 2016-03-15 DIAGNOSIS — H66002 Acute suppurative otitis media without spontaneous rupture of ear drum, left ear: Secondary | ICD-10-CM | POA: Diagnosis not present

## 2016-03-15 LAB — POCT RAPID STREP A (OFFICE): Rapid Strep A Screen: NEGATIVE

## 2016-03-15 MED ORDER — AMOXICILLIN-POT CLAVULANATE 875-125 MG PO TABS: 1.0000 | ORAL_TABLET | Freq: Two times a day (BID) | ORAL | Status: DC

## 2016-03-15 NOTE — Patient Instructions (Signed)
Otitis Media With Effusion Otitis media with effusion is the presence of fluid in the middle ear. This is a common problem in children, which often follows ear infections. It may be present for weeks or longer after the infection. Unlike an acute ear infection, otitis media with effusion refers only to fluid behind the ear drum and not infection. Children with repeated ear and sinus infections and allergy problems are the most likely to get otitis media with effusion. CAUSES  The most frequent cause of the fluid buildup is dysfunction of the eustachian tubes. These are the tubes that drain fluid in the ears to the back of the nose (nasopharynx). SYMPTOMS   The main symptom of this condition is hearing loss. As a result, you or your child may:  Listen to the TV at a loud volume.  Not respond to questions.  Ask "what" often when spoken to.  Mistake or confuse one sound or word for another.  There may be a sensation of fullness or pressure but usually not pain. DIAGNOSIS   Your health care provider will diagnose this condition by examining you or your child's ears.  Your health care provider may test the pressure in you or your child's ear with a tympanometer.  A hearing test may be conducted if the problem persists. TREATMENT   Treatment depends on the duration and the effects of the effusion.  Antibiotics, decongestants, nose drops, and cortisone-type drugs (tablets or nasal spray) may not be helpful.  Children with persistent ear effusions may have delayed language or behavioral problems. Children at risk for developmental delays in hearing, learning, and speech may require referral to a specialist earlier than children not at risk.  You or your child's health care provider may suggest a referral to an ear, nose, and throat surgeon for treatment. The following may help restore normal hearing:  Drainage of fluid.  Placement of ear tubes (tympanostomy tubes).  Removal of adenoids  (adenoidectomy). HOME CARE INSTRUCTIONS   Avoid secondhand smoke.  Infants who are breastfed are less likely to have this condition.  Avoid feeding infants while they are lying flat.  Avoid known environmental allergens.  Avoid people who are sick. SEEK MEDICAL CARE IF:   Hearing is not better in 3 months.  Hearing is worse.  Ear pain.  Drainage from the ear.  Dizziness. MAKE SURE YOU:   Understand these instructions.  Will watch your condition.  Will get help right away if you are not doing well or get worse.   This information is not intended to replace advice given to you by your health care provider. Make sure you discuss any questions you have with your health care provider.   Document Released: 01/05/2005 Document Revised: 12/19/2014 Document Reviewed: 06/25/2013 Elsevier Interactive Patient Education 2016 Elsevier Inc. Sinusitis, Adult Sinusitis is redness, soreness, and inflammation of the paranasal sinuses. Paranasal sinuses are air pockets within the bones of your face. They are located beneath your eyes, in the middle of your forehead, and above your eyes. In healthy paranasal sinuses, mucus is able to drain out, and air is able to circulate through them by way of your nose. However, when your paranasal sinuses are inflamed, mucus and air can become trapped. This can allow bacteria and other germs to grow and cause infection. Sinusitis can develop quickly and last only a short time (acute) or continue over a long period (chronic). Sinusitis that lasts for more than 12 weeks is considered chronic. CAUSES Causes of sinusitis   include:  Allergies.  Structural abnormalities, such as displacement of the cartilage that separates your nostrils (deviated septum), which can decrease the air flow through your nose and sinuses and affect sinus drainage.  Functional abnormalities, such as when the small hairs (cilia) that line your sinuses and help remove mucus do not work  properly or are not present. SIGNS AND SYMPTOMS Symptoms of acute and chronic sinusitis are the same. The primary symptoms are pain and pressure around the affected sinuses. Other symptoms include:  Upper toothache.  Earache.  Headache.  Bad breath.  Decreased sense of smell and taste.  A cough, which worsens when you are lying flat.  Fatigue.  Fever.  Thick drainage from your nose, which often is green and may contain pus (purulent).  Swelling and warmth over the affected sinuses. DIAGNOSIS Your health care provider will perform a physical exam. During your exam, your health care provider may perform any of the following to help determine if you have acute sinusitis or chronic sinusitis:  Look in your nose for signs of abnormal growths in your nostrils (nasal polyps).  Tap over the affected sinus to check for signs of infection.  View the inside of your sinuses using an imaging device that has a light attached (endoscope). If your health care provider suspects that you have chronic sinusitis, one or more of the following tests may be recommended:  Allergy tests.  Nasal culture. A sample of mucus is taken from your nose, sent to a lab, and screened for bacteria.  Nasal cytology. A sample of mucus is taken from your nose and examined by your health care provider to determine if your sinusitis is related to an allergy. TREATMENT Most cases of acute sinusitis are related to a viral infection and will resolve on their own within 10 days. Sometimes, medicines are prescribed to help relieve symptoms of both acute and chronic sinusitis. These may include pain medicines, decongestants, nasal steroid sprays, or saline sprays. However, for sinusitis related to a bacterial infection, your health care provider will prescribe antibiotic medicines. These are medicines that will help kill the bacteria causing the infection. Rarely, sinusitis is caused by a fungal infection. In these cases,  your health care provider will prescribe antifungal medicine. For some cases of chronic sinusitis, surgery is needed. Generally, these are cases in which sinusitis recurs more than 3 times per year, despite other treatments. HOME CARE INSTRUCTIONS  Drink plenty of water. Water helps thin the mucus so your sinuses can drain more easily.  Use a humidifier.  Inhale steam 3-4 times a day (for example, sit in the bathroom with the shower running).  Apply a warm, moist washcloth to your face 3-4 times a day, or as directed by your health care provider.  Use saline nasal sprays to help moisten and clean your sinuses.  Take medicines only as directed by your health care provider.  If you were prescribed either an antibiotic or antifungal medicine, finish it all even if you start to feel better. SEEK IMMEDIATE MEDICAL CARE IF:  You have increasing pain or severe headaches.  You have nausea, vomiting, or drowsiness.  You have swelling around your face.  You have vision problems.  You have a stiff neck.  You have difficulty breathing.   This information is not intended to replace advice given to you by your health care provider. Make sure you discuss any questions you have with your health care provider.   Document Released: 11/28/2005 Document Revised: 12/19/2014   Document Reviewed: 12/13/2011 Elsevier Interactive Patient Education 2016 Elsevier Inc.  

## 2016-03-15 NOTE — Progress Notes (Signed)
Patient: Justin Soto Male    DOB: 10/24/1961   55 y.o.   MRN: [id number] Visit Date: 03/15/2016  Today's Provider: Mar Daring, PA-C   Chief Complaint  Patient presents with  . Sore Throat   Subjective:    Sore Throat  This is a new problem. The current episode started in the past 7 days (Started hurting on Saturday). The problem has been gradually worsening. Maximum temperature: "Doesn't know if he had a fever, but woke up sweating a lot" Associated symptoms include congestion, coughing, ear pain, headaches, a hoarse voice, a plugged ear sensation, shortness of breath and swollen glands. Pertinent negatives include no diarrhea, ear discharge, trouble swallowing or vomiting. He has tried acetaminophen (Zyrtec and his Inhaler) for the symptoms. The treatment provided no relief.      Allergies  Allergen Reactions  . Methocarbamol Other (See Comments)    Numbness, and change of taste   . Tizanidine Nausea Only   Previous Medications   AMLODIPINE-BENAZEPRIL (LOTREL) 10-40 MG CAPSULE    Take 1 capsule by mouth daily.   ASPIRIN EC 81 MG TABLET    Take 81 mg by mouth daily.   CETIRIZINE (ZYRTEC) 10 MG TABLET    1 to 2 tablets daily as needed   FLUTICASONE (FLONASE) 50 MCG/ACT NASAL SPRAY    Place into the nose.   HYDROCHLOROTHIAZIDE (HYDRODIURIL) 25 MG TABLET    Take 1 tablet (25 mg total) by mouth daily.   IBUPROFEN (ADVIL,MOTRIN) 800 MG TABLET    Take 1 tablet (800 mg total) by mouth 3 (three) times daily as needed.   METOPROLOL TARTRATE (LOPRESSOR) 25 MG TABLET    Take 1 tablet (25 mg total) by mouth 2 (two) times daily.   MONTELUKAST (SINGULAIR) 10 MG TABLET    Take 10 mg by mouth every morning.    MULTIPLE VITAMIN (MULTIVITAMIN WITH MINERALS) TABS TABLET    Take 1 tablet by mouth daily.   OMEPRAZOLE (PRILOSEC) 10 MG CAPSULE    Take 10 mg by mouth daily.   PREGABALIN (LYRICA) 300 MG CAPSULE    Take 1 capsule (300 mg total) by mouth 2 (two) times daily.   TAMSULOSIN  (FLOMAX) 0.4 MG CAPS CAPSULE    Take 2 capsules (0.8 mg total) by mouth daily.    Review of Systems  Constitutional: Positive for chills.  HENT: Positive for congestion, ear pain, hoarse voice, postnasal drip, rhinorrhea, sneezing and sore throat. Negative for ear discharge, sinus pressure and trouble swallowing.   Respiratory: Positive for cough, chest tightness, shortness of breath and wheezing.   Cardiovascular: Negative for chest pain, palpitations and leg swelling.  Gastrointestinal: Negative for vomiting and diarrhea.  Neurological: Positive for headaches. Negative for dizziness.    Social History  Substance Use Topics  . Smoking status: Never Smoker   . Smokeless tobacco: Not on file  . Alcohol Use: No   Objective:   BP 148/80 mmHg  Pulse 92  Temp(Src) 98.8 F (37.1 C) (Oral)  Resp 16  Wt 288 lb 12.8 oz (130.999 kg)  Physical Exam  Constitutional: He appears well-developed and well-nourished. No distress.  HENT:  Head: Normocephalic and atraumatic.  Right Ear: Hearing, external ear and ear canal normal. Tympanic membrane is not erythematous and not bulging. A middle ear effusion is present.  Left Ear: Hearing, external ear and ear canal normal. Tympanic membrane is erythematous and bulging. A middle ear effusion is present.  Nose: Mucosal edema  and rhinorrhea present. Right sinus exhibits maxillary sinus tenderness and frontal sinus tenderness. Left sinus exhibits maxillary sinus tenderness and frontal sinus tenderness.  Mouth/Throat: Uvula is midline and mucous membranes are normal. Posterior oropharyngeal erythema present. No oropharyngeal exudate or posterior oropharyngeal edema.  Eyes: Conjunctivae and EOM are normal. Pupils are equal, round, and reactive to light. Right eye exhibits no discharge. Left eye exhibits no discharge.  Neck: Normal range of motion. Neck supple. No tracheal deviation present. No Brudzinski's sign and no Kernig's sign noted. No thyromegaly  present.  Cardiovascular: Normal rate, regular rhythm and normal heart sounds.  Exam reveals no gallop and no friction rub.   No murmur heard. Pulmonary/Chest: Effort normal and breath sounds normal. No stridor. No respiratory distress. He has no wheezes. He has no rales.  Lymphadenopathy:    He has no cervical adenopathy.  Skin: Skin is warm and dry. He is not diaphoretic.  Vitals reviewed.       Assessment & Plan:     1. Sore throat Rapid strep was negative today. - POCT rapid strep A  2. Acute pansinusitis, recurrence not specified Worsening symptoms. Will treat with augmentin as below. Advised to take IBU or tylenol for fevers and body aches. Discussed Mucinex DM or Delsym for cough. He is to continue his allergy medications. We did discuss that since he has been on zyrtec for a long time he may benefit from switching to another antihistamine, or taking zyrtec in the morning and the other one in the evening for better symptom control. Also discussed if he gets another sinus infection that would be his 3rd or 4th since this winter and he may benefit with ENT referral. He agrees and will call if symptoms fail to improve or worsen. - amoxicillin-clavulanate (AUGMENTIN) 875-125 MG tablet; Take 1 tablet by mouth 2 (two) times daily.  Dispense: 20 tablet; Refill: 0  3. Acute suppurative otitis media of left ear without spontaneous rupture of tympanic membrane, recurrence not specified See above medical treatment plan.       Mar Daring, PA-C  [facility]

## 2016-03-18 ENCOUNTER — Telehealth: Payer: Self-pay | Admitting: Physician Assistant

## 2016-03-18 DIAGNOSIS — H66002 Acute suppurative otitis media without spontaneous rupture of ear drum, left ear: Secondary | ICD-10-CM

## 2016-03-18 MED ORDER — NEOMYCIN-POLYMYXIN-HC 1 % OT SOLN: 3.0000 [drp] | Freq: Four times a day (QID) | OTIC | Status: DC

## 2016-03-18 NOTE — Telephone Encounter (Signed)
Advised  ED 

## 2016-03-18 NOTE — Telephone Encounter (Signed)
Patient called in stating his left ear is still very painful and hurting.   He is now dizzy when he gets up. Wants to know if there is anything else you can give him for ear pain and dizziness.  Call back  [phone]

## 2016-03-18 NOTE — Telephone Encounter (Signed)
Sent in drops to [facility]

## 2016-03-22 ENCOUNTER — Telehealth: Payer: Self-pay | Admitting: Urology

## 2016-03-22 NOTE — Telephone Encounter (Signed)
Justin Soto called saying he was prescribed Flomax and is supposed to take two capsules per day. His refill has one pill for thirty days listed and he's wondering if an updated Rx should be sent to his pharmacy. He has four pills left and would like a return phone call concerning this.  Pt's ph# [phone] Thank you.

## 2016-03-28 NOTE — Telephone Encounter (Signed)
Spoke with pt in reference to tamsulosin. Pt stated that since taking medication he has had several sinus infections. Pt stated he would like to try a different medication. Please advise.

## 2016-03-28 NOTE — Telephone Encounter (Signed)
If he is not on nitrate therapy, we can try Cialis 5 mg daily.  Then he will need to follow up one month after the starting the Cialis.

## 2016-03-29 NOTE — Telephone Encounter (Signed)
Spoke with pt in reference to cialis. Pt stated that he was previously on Rapaflo and requested to give the medication a second try. Per [name] that would be fine and samples could be provided. 3 weeks of samples were left up front and pt made aware. Pt voiced understanding.

## 2016-03-30 DIAGNOSIS — E669 Obesity, unspecified: Secondary | ICD-10-CM | POA: Diagnosis not present

## 2016-03-30 DIAGNOSIS — D509 Iron deficiency anemia, unspecified: Secondary | ICD-10-CM | POA: Diagnosis not present

## 2016-03-30 DIAGNOSIS — I10 Essential (primary) hypertension: Secondary | ICD-10-CM | POA: Diagnosis not present

## 2016-03-30 DIAGNOSIS — R739 Hyperglycemia, unspecified: Secondary | ICD-10-CM | POA: Diagnosis not present

## 2016-03-31 LAB — LIPID PANEL WITH LDL/HDL RATIO
Cholesterol, Total: 133 mg/dL (ref 100–199)
HDL: 23 mg/dL — ABNORMAL LOW (ref >39)
LDL Calculated: 65 mg/dL (ref 0–99)
LDl/HDL Ratio: 2.8 ratio units (ref 0.0–3.6)
Triglycerides: 227 mg/dL — ABNORMAL HIGH (ref 0–149)
VLDL Cholesterol Cal: 45 mg/dL — ABNORMAL HIGH (ref 5–40)

## 2016-03-31 LAB — COMPREHENSIVE METABOLIC PANEL
ALT: 30 IU/L (ref 0–44)
AST: 26 IU/L (ref 0–40)
Albumin/Globulin Ratio: 1.1 — ABNORMAL LOW (ref 1.2–2.2)
Albumin: 4.1 g/dL (ref 3.5–5.5)
Alkaline Phosphatase: 55 IU/L (ref 39–117)
BUN/Creatinine Ratio: 15 (ref 9–20)
BUN: 14 mg/dL (ref 6–24)
Bilirubin Total: 0.5 mg/dL (ref 0.0–1.2)
CO2: 24 mmol/L (ref 18–29)
Calcium: 9.1 mg/dL (ref 8.7–10.2)
Chloride: 99 mmol/L (ref 96–106)
Creatinine, Ser: 0.91 mg/dL (ref 0.76–1.27)
GFR calc Af Amer: 109 mL/min/{1.73_m2} (ref >59)
GFR calc non Af Amer: 95 mL/min/{1.73_m2} (ref >59)
Globulin, Total: 3.6 g/dL (ref 1.5–4.5)
Glucose: 115 mg/dL — ABNORMAL HIGH (ref 65–99)
Potassium: 4.8 mmol/L (ref 3.5–5.2)
Sodium: 139 mmol/L (ref 134–144)
Total Protein: 7.7 g/dL (ref 6.0–8.5)

## 2016-03-31 LAB — CBC WITH DIFFERENTIAL/PLATELET
Basophils Absolute: 0 10*3/uL (ref 0.0–0.2)
Basos: 1 %
EOS (ABSOLUTE): 0.1 10*3/uL (ref 0.0–0.4)
Eos: 3 %
Hematocrit: 32.1 % — ABNORMAL LOW (ref 37.5–51.0)
Hemoglobin: 10.3 g/dL — ABNORMAL LOW (ref 12.6–17.7)
Immature Grans (Abs): 0 10*3/uL (ref 0.0–0.1)
Immature Granulocytes: 0 %
Lymphocytes Absolute: 1.4 10*3/uL (ref 0.7–3.1)
Lymphs: 31 %
MCH: 19.5 pg — ABNORMAL LOW (ref 26.6–33.0)
MCHC: 32.1 g/dL (ref 31.5–35.7)
MCV: 61 fL — ABNORMAL LOW (ref 79–97)
Monocytes Absolute: 0.4 10*3/uL (ref 0.1–0.9)
Monocytes: 9 %
Neutrophils Absolute: 2.5 10*3/uL (ref 1.4–7.0)
Neutrophils: 56 %
Platelets: 235 10*3/uL (ref 150–379)
RBC: 5.27 x10E6/uL (ref 4.14–5.80)
RDW: 15.9 % — ABNORMAL HIGH (ref 12.3–15.4)
WBC: 4.4 10*3/uL (ref 3.4–10.8)

## 2016-03-31 LAB — HEMOGLOBIN A1C
Est. average glucose Bld gHb Est-mCnc: 137 mg/dL
Hgb A1c MFr Bld: 6.4 % — ABNORMAL HIGH (ref 4.8–5.6)

## 2016-03-31 LAB — TSH: TSH: 1.13 u[IU]/mL (ref 0.450–4.500)

## 2016-04-18 ENCOUNTER — Other Ambulatory Visit: Payer: Self-pay

## 2016-04-18 MED ORDER — FLUTICASONE PROPIONATE 50 MCG/ACT NA SUSP: 2.0000 | Freq: Every day | NASAL | Status: DC

## 2016-04-20 ENCOUNTER — Telehealth: Payer: Self-pay | Admitting: Family Medicine

## 2016-04-20 NOTE — Telephone Encounter (Signed)
Pt needs lyrica refills please.

## 2016-04-22 ENCOUNTER — Telehealth: Payer: Self-pay

## 2016-04-22 ENCOUNTER — Telehealth: Payer: Self-pay | Admitting: Family Medicine

## 2016-04-22 MED ORDER — PREGABALIN 300 MG PO CAPS: 300.0000 mg | ORAL_CAPSULE | Freq: Two times a day (BID) | ORAL | Status: DC

## 2016-04-22 NOTE — Telephone Encounter (Signed)
Ok to rf for 1 year. 

## 2016-04-22 NOTE — Telephone Encounter (Signed)
See other message-a

## 2016-04-22 NOTE — Telephone Encounter (Signed)
Pharmacy called for new RX of his Lyrica 300mg   Please advise Pharmacy  Thanks, Con Memos

## 2016-04-22 NOTE — Telephone Encounter (Signed)
Patient called on may 10th asking to have Lyrica filled but message did not get sent to [place] somehow, patient is calling back today. Patient needs it today please.-aa

## 2016-04-22 NOTE — Telephone Encounter (Signed)
Done-aa 

## 2016-05-23 ENCOUNTER — Other Ambulatory Visit: Payer: Self-pay

## 2016-05-23 MED ORDER — MONTELUKAST SODIUM 10 MG PO TABS: 10.0000 mg | ORAL_TABLET | ORAL | Status: DC

## 2016-06-02 ENCOUNTER — Ambulatory Visit (INDEPENDENT_AMBULATORY_CARE_PROVIDER_SITE_OTHER): Payer: BLUE CROSS/BLUE SHIELD | Admitting: Family Medicine

## 2016-06-02 VITALS — BP 134/88 | HR 80 | Temp 98.6°F | Resp 18 | Wt 294.0 lb

## 2016-06-02 DIAGNOSIS — M7661 Achilles tendinitis, right leg: Secondary | ICD-10-CM | POA: Diagnosis not present

## 2016-06-02 DIAGNOSIS — J302 Other seasonal allergic rhinitis: Secondary | ICD-10-CM | POA: Diagnosis not present

## 2016-06-02 DIAGNOSIS — R7303 Prediabetes: Secondary | ICD-10-CM | POA: Diagnosis not present

## 2016-06-02 DIAGNOSIS — E669 Obesity, unspecified: Secondary | ICD-10-CM

## 2016-06-02 DIAGNOSIS — I10 Essential (primary) hypertension: Secondary | ICD-10-CM

## 2016-06-02 DIAGNOSIS — R600 Localized edema: Secondary | ICD-10-CM | POA: Diagnosis not present

## 2016-06-02 NOTE — Progress Notes (Signed)
Patient ID: Justin Soto, male   DOB: May 15, 1961, 55 y.o.   MRN: [id number]    Subjective:  HPI  Patient is here for 3 months follow up. 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 Patient did start taking Metoprolol about 2 months now but b/p did not improve enough for passing when he checks it. No side effects. BP Readings from Last 3 Encounters:  06/02/16 134/88  03/15/16 148/80  03/08/16 136/78    Prior to Admission medications   Medication Sig Start Date End Date Taking? Authorizing Provider  amLODipine-benazepril (LOTREL) 10-40 MG capsule Take 1 capsule by mouth daily. 02/16/16  Yes Rhanda Lemire Maceo Pro., MD  amoxicillin-clavulanate (AUGMENTIN) 875-125 MG tablet Take 1 tablet by mouth 2 (two) times daily. 03/15/16  Yes Mar Daring, PA-C  aspirin EC 81 MG tablet Take 81 mg by mouth daily.   Yes Historical Provider, MD  cetirizine (ZYRTEC) 10 MG tablet 1 to 2 tablets daily as needed 03/08/16  Yes Jahzier Villalon Maceo Pro., MD  fluticasone [facility]) 50 MCG/ACT nasal spray Place 2 sprays into both nostrils daily. 04/18/16  Yes Estiven Kohan Maceo Pro., MD  hydrochlorothiazide (HYDRODIURIL) 25 MG tablet Take 1 tablet (25 mg total) by mouth daily. 02/16/16  Yes Mattalynn Crandle Maceo Pro., MD  ibuprofen (ADVIL,MOTRIN) 800 MG tablet Take 1 tablet (800 mg total) by mouth 3 (three) times daily as needed. 03/08/16  Yes Jahseh Lucchese Maceo Pro., MD  metoprolol tartrate (LOPRESSOR) 25 MG tablet Take 1 tablet (25 mg total) by mouth 2 (two) times daily. 03/08/16  Yes Christina Waldrop Maceo Pro., MD  montelukast (SINGULAIR) 10 MG tablet Take 1 tablet (10 mg total) by mouth every morning. 05/23/16  Yes Hildagarde Holleran Maceo Pro., MD  Multiple Vitamin (MULTIVITAMIN WITH MINERALS) TABS tablet Take 1 tablet by mouth daily.   Yes Historical Provider, MD  NEOMYCIN-POLYMYXIN-HYDROCORTISONE (CORTISPORIN) 1 % SOLN otic solution Place 3  drops into the left ear 4 (four) times daily. 03/18/16  Yes Clearnce Sorrel Burnette, PA-C  omeprazole (PRILOSEC) 10 MG capsule Take 10 mg by mouth daily.   Yes Historical Provider, MD  pregabalin (LYRICA) 300 MG capsule Take 1 capsule (300 mg total) by mouth 2 (two) times daily. 04/22/16  Yes Samiksha Pellicano Maceo Pro., MD  tamsulosin (FLOMAX) 0.4 MG CAPS capsule Take 2 capsules (0.8 mg total) by mouth daily. 12/18/15  Yes Roda Shutters, FNP    Patient Active Problem List   Diagnosis Date Noted  . ADD (attention deficit disorder) 09/08/2015  . Absolute anemia 09/08/2015  . Achilles bursitis 09/08/2015  . Back pain, thoracic 09/08/2015  . Benign prostatic hypertrophy with lower urinary tract symptoms (LUTS) 09/08/2015  . Cellulitis 09/08/2015  . Clinical depression 09/08/2015  . Deep vein thrombosis ([place]) 09/08/2015  . Acid reflux 09/08/2015  . Bulge of cervical disc without myelopathy 09/08/2015  . 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 BP (high blood pressure) 09/08/2015  . Cannot sleep 09/08/2015  . Anemia, iron deficiency 09/08/2015  . Bad memory 09/08/2015  . Adiposity 09/08/2015  . Allergic rhinitis, seasonal 09/08/2015  . 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 Fast heart beat 09/08/2015  . Cervical spondylosis with myelopathy and radiculopathy 11/13/2014  . Hernia, inguinal, unilateral 04/06/2009    Past Medical History  Diagnosis Date  . Hypertension   .  Irregular heart beat     Pt feels this every once in a while; PCP aware but stated it was OK  . Tick fever   . Neck pain of over 3 months duration   . Frequency of urination   . Enlarged prostate     hx of  . History of kidney stones   . Environmental and seasonal allergies   . Leg pain, right     Take Lyrica and Naproxen  . HOH (hard of hearing)     wears bilateral hearing aids  . Sleep apnea     NO CPAP  . GERD (gastroesophageal reflux disease)   . Anemia      Social History   Social History  . Marital Status: Married    Spouse Name: N/A  . Number of Children: N/A  . Years of Education: N/A   Occupational History  . Not on file.   Social History Main Topics  . Smoking status: Never Smoker   . Smokeless tobacco: Not on file  . Alcohol Use: No  . Drug Use: No  . Sexual Activity: Not on file   Other Topics Concern  . Not on file   Social History Narrative    Allergies  Allergen Reactions  . Methocarbamol Other (See Comments)    Numbness, and change of taste   . Tizanidine Nausea Only    Review of Systems  Constitutional: Negative.   Respiratory: Negative.   Cardiovascular: Positive for leg swelling. Negative for chest pain and palpitations.  Musculoskeletal: Positive for myalgias, back pain and joint pain.    Immunization History  Administered Date(s) Administered  . Tdap 01/03/2013   Objective:  BP 134/88 mmHg  Pulse 80  Temp(Src) 98.6 F (37 C)  Resp 18  Wt 294 lb (133.358 kg)  Physical Exam  Constitutional: He is oriented to person, place, and time and well-developed, well-nourished, and in no distress.  HENT:  Head: Normocephalic and atraumatic.  Right Ear: External ear normal.  Left Ear: External ear normal.  Nose: Nose normal.  Eyes: Conjunctivae are normal. Pupils are equal, round, and reactive to light.  Neck: Normal range of motion. Neck supple.  Cardiovascular: Normal rate, regular rhythm, normal heart sounds and intact distal pulses.   No murmur heard. Pulmonary/Chest: Effort normal and breath sounds normal. No respiratory distress. He has no wheezes.  Abdominal: Soft.  Musculoskeletal: He exhibits edema (1+).  6. Bursitis of achilles into the calcaneus on the right  Neurological: He is alert and oriented to person, place, and time. GCS score is 15.  Skin: Skin is warm and dry.  Psychiatric: Memory, affect and judgment normal.    Lab Results  Component Value Date   WBC 4.4 03/30/2016    HGB 11.6* 11/04/2014   HCT 32.1* 03/30/2016   PLT 235 03/30/2016   GLUCOSE 115* 03/30/2016   CHOL 133 03/30/2016   TRIG 227* 03/30/2016   HDL 23* 03/30/2016   LDLCALC 65 03/30/2016   TSH 1.130 03/30/2016   PSA 3.2 05/16/2014   INR 1.0 08/11/2014   HGBA1C 6.4* 03/30/2016    CMP     Component Value Date/Time   NA 139 03/30/2016 0946   NA 140 11/04/2014 1020   NA 138 08/11/2014 1230   K 4.8 03/30/2016 0946   K 4.1 08/11/2014 1230   CL 99 03/30/2016 0946   CL 104 08/11/2014 1230   CO2 24 03/30/2016 0946   CO2 28 08/11/2014 1230   GLUCOSE 115*  03/30/2016 0946   GLUCOSE 120* 11/04/2014 1020   GLUCOSE 104* 08/11/2014 1230   BUN 14 03/30/2016 0946   BUN 12 11/04/2014 1020   BUN 11 08/11/2014 1230   CREATININE 0.91 03/30/2016 0946   CREATININE 1.01 08/11/2014 1230   CREATININE 0.8 07/14/2014   CALCIUM 9.1 03/30/2016 0946   CALCIUM 8.5 08/11/2014 1230   PROT 7.7 03/30/2016 0946   PROT 7.7 08/11/2014 1230   ALBUMIN 4.1 03/30/2016 0946   ALBUMIN 4.0 08/11/2014 1230   AST 26 03/30/2016 0946   AST 16 08/11/2014 1230   ALT 30 03/30/2016 0946   ALT 28 08/11/2014 1230   ALKPHOS 55 03/30/2016 0946   ALKPHOS 55 08/11/2014 1230   BILITOT 0.5 03/30/2016 0946   BILITOT 0.6 08/11/2014 1230   GFRNONAA 95 03/30/2016 0946   GFRNONAA >60 08/11/2014 1230   GFRAA 109 03/30/2016 0946   GFRAA >60 08/11/2014 1230    Assessment and Plan :  1. Essential hypertension Readings are good in the office but still getting elevated readings when he checks it. Work on weight loss. No medication changes to make today. Follow.  2. Allergic rhinitis, seasonal Stable.  3. Adiposity Advised patient to work on habits. If patient can loose weight this will help a lot of his issues in my opinion. More than 50% of visit spent in counselling. 4. Pre-diabetes A1C 6.4 on 03/30/16, discussed with patient in details that he needs to work on habits before this issue gets worse. Patient states he will start  back with weight watchers. Will follow up on the next visit. Check A1C and follow up on weight on the next visit.  5. Edema Discussed with patient that Amlodipine contributing to this issue. Advised patient to wear compression hose.  6. Bursitis of achilles into the calcaneus on the right Refer back to Dr. Sabra Heck, this is affecting patients life now.  Patient was seen and examined by Dr. Eulas Post and note was scribed by Theressa Millard, RMA.  I have done the exam and reviewed the above chart and it is accurate to the best of my knowledge.  Miguel Aschoff MD [facility] 06/02/2016 8:15 AM

## 2016-06-03 DIAGNOSIS — M7661 Achilles tendinitis, right leg: Secondary | ICD-10-CM | POA: Diagnosis not present

## 2016-06-15 ENCOUNTER — Other Ambulatory Visit: Payer: Self-pay

## 2016-06-15 DIAGNOSIS — I10 Essential (primary) hypertension: Secondary | ICD-10-CM

## 2016-06-15 MED ORDER — HYDROCHLOROTHIAZIDE 25 MG PO TABS: 25.0000 mg | ORAL_TABLET | Freq: Every day | ORAL | Status: DC

## 2016-06-21 ENCOUNTER — Encounter: Payer: Self-pay | Admitting: Family Medicine

## 2016-06-21 ENCOUNTER — Ambulatory Visit (INDEPENDENT_AMBULATORY_CARE_PROVIDER_SITE_OTHER): Payer: BLUE CROSS/BLUE SHIELD | Admitting: Family Medicine

## 2016-06-21 VITALS — BP 128/70 | HR 100 | Temp 98.4°F | Resp 20 | Wt 285.0 lb

## 2016-06-21 DIAGNOSIS — H6982 Other specified disorders of Eustachian tube, left ear: Secondary | ICD-10-CM | POA: Diagnosis not present

## 2016-06-21 NOTE — Patient Instructions (Signed)
Add ibuprofen while the Flonase spray is getting to work again.

## 2016-06-21 NOTE — Progress Notes (Signed)
Subjective:     Patient ID: Justin Soto, male   DOB: [birth year], 55 y.o.   MRN: [id number]  HPI  Chief Complaint  Patient presents with  . Ear Pain    left  States he feels pressure,increased pain, and muffled hearing since yesterday. States allergies are under fair control but has not refilled his Flonase.   Review of Systems     Objective:   Physical Exam  Constitutional: He appears well-developed and well-nourished. No distress.  HENT:  No tenderness on percussion of his teeth. Bilateral ear canals are patent with intact T.M's without inflammation. Mild left tragal/posterior TMJ area tenderness.  Musculoskeletal:  Cervical ROM intact without exacerbation of his sx.       Assessment:    1. Eustachian tube dysfunction, left    Plan:    Will resume Flonase nasal spray and add ibuprofen as needed.

## 2016-07-19 NOTE — Telephone Encounter (Signed)
error 

## 2016-07-26 ENCOUNTER — Ambulatory Visit (INDEPENDENT_AMBULATORY_CARE_PROVIDER_SITE_OTHER): Payer: BLUE CROSS/BLUE SHIELD | Admitting: Family Medicine

## 2016-07-26 ENCOUNTER — Encounter: Payer: Self-pay | Admitting: Family Medicine

## 2016-07-26 VITALS — BP 126/80 | HR 80 | Temp 98.1°F | Resp 22 | Ht 66.0 in | Wt 283.0 lb

## 2016-07-26 DIAGNOSIS — I10 Essential (primary) hypertension: Secondary | ICD-10-CM | POA: Diagnosis not present

## 2016-07-26 DIAGNOSIS — E669 Obesity, unspecified: Secondary | ICD-10-CM

## 2016-07-26 DIAGNOSIS — R739 Hyperglycemia, unspecified: Secondary | ICD-10-CM | POA: Diagnosis not present

## 2016-07-26 LAB — POCT GLYCOSYLATED HEMOGLOBIN (HGB A1C)
Est. average glucose Bld gHb Est-mCnc: 111
Hemoglobin A1C: 5.5

## 2016-07-26 NOTE — Progress Notes (Signed)
Patient: Justin Soto Male    DOB: 03-Aug-1961   55 y.o.   MRN: [id number] Visit Date: 07/26/2016  Today's Provider: [name], MD   Chief Complaint  Patient presents with  . Hyperglycemia  . Obesity   Subjective:    HPI      Hyperglycemia, Follow-up:   Lab Results  Component Value Date   HGBA1C 6.4 (H) 03/30/2016   GLUCOSE 115 (H) 03/30/2016   GLUCOSE 120 (H) 11/04/2014   GLUCOSE 104 (H) 08/11/2014    Last seen for for this 2 months ago.  Management since then includes advising pt to work on diet and exercise. Current symptoms include polyuria and weight loss and have been stable.  Weight trend: decreasing steadily Prior visit with dietician: no Current diet: in general, a "healthy" diet  Has started weight watchers. Current exercise: none due to foot/leg pain.  Pertinent Labs:    Component Value Date/Time   CHOL 133 03/30/2016 0946   TRIG 227 (H) 03/30/2016 0946   CREATININE 0.91 03/30/2016 0946   CREATININE 1.01 08/11/2014 1230    Wt Readings from Last 3 Encounters:  07/26/16 283 lb (128.4 kg)  06/21/16 285 lb (129.3 kg)  06/02/16 294 lb (133.4 kg)    Follow up for Obesity  The patient was last seen for this 2 months ago. Changes made at last visit include advising pt to work on diet and exercise.Marland Kitchen  He reports excellent compliance with treatment. He feels that condition is Improved. Pt has lost 9 lbs altogether.   ------------------------------------------------------------------------------------     Allergies  Allergen Reactions  . Methocarbamol Other (See Comments)    Numbness, and change of taste   . Tizanidine Nausea Only   Current Meds  Medication Sig  . amLODipine-benazepril (LOTREL) 10-40 MG capsule Take 1 capsule by mouth daily.  Marland Kitchen aspirin EC 81 MG tablet Take 81 mg by mouth daily.  . cetirizine (ZYRTEC) 10 MG tablet 1 to 2 tablets daily as needed  . fluticasone (FLONASE) 50 MCG/ACT nasal spray Place 2 sprays  into both nostrils daily.  . hydrochlorothiazide (HYDRODIURIL) 25 MG tablet Take 1 tablet (25 mg total) by mouth daily.  Marland Kitchen ibuprofen (ADVIL,MOTRIN) 800 MG tablet Take 1 tablet (800 mg total) by mouth 3 (three) times daily as needed.  . meloxicam (MOBIC) 15 MG tablet Take 15 mg by mouth daily.  . metoprolol tartrate (LOPRESSOR) 25 MG tablet Take 1 tablet (25 mg total) by mouth 2 (two) times daily.  . montelukast (SINGULAIR) 10 MG tablet Take 1 tablet (10 mg total) by mouth every morning.  . Multiple Vitamin (MULTIVITAMIN WITH MINERALS) TABS tablet Take 1 tablet by mouth daily.  Marland Kitchen omeprazole (PRILOSEC) 10 MG capsule Take 10 mg by mouth daily.  . pregabalin (LYRICA) 300 MG capsule Take 1 capsule (300 mg total) by mouth 2 (two) times daily. (Patient taking differently: Take 300 mg by mouth daily. )  . tamsulosin (FLOMAX) 0.4 MG CAPS capsule Take 2 capsules (0.8 mg total) by mouth daily.    Review of Systems  Constitutional: Negative for activity change, appetite change, chills, diaphoresis, fatigue, fever and unexpected weight change.  HENT: Negative.   Eyes: Negative.   Respiratory: Negative for shortness of breath.   Cardiovascular: Negative for chest pain and leg swelling.  Endocrine: Negative.   Musculoskeletal: Positive for gait problem and joint swelling.  Allergic/Immunologic: Negative.   Hematological: Negative.   Psychiatric/Behavioral: Negative.     Social  History  Substance Use Topics  . Smoking status: Never Smoker  . Smokeless tobacco: Never Used  . Alcohol use No   Objective:   BP 126/80 (BP Location: Left Arm, Patient Position: Sitting, Cuff Size: Large)   Pulse 80   Temp 98.1 F (36.7 C) (Oral)   Resp (!) 22   Ht 5\' 6"  (1.676 m)   Wt 283 lb (128.4 kg)   BMI 45.68 kg/m   Physical Exam  Constitutional: He appears well-developed and well-nourished.  HENT:  Head: Normocephalic and atraumatic.  Eyes: Conjunctivae are normal. No scleral icterus.  Neck: Neck  supple. No thyromegaly present.  Cardiovascular: Normal rate, regular rhythm and normal heart sounds.   Pulmonary/Chest: Effort normal and breath sounds normal. No respiratory distress.  Musculoskeletal: He exhibits edema (1 + edema).  2+  LE edema  Lymphadenopathy:    He has no cervical adenopathy.  Skin: Skin is warm.  Psychiatric: He has a normal mood and affect. His behavior is normal. Judgment and thought content normal.        Assessment & Plan:     1. Hyperglycemia Improving. Continue lifestyle changes, increase exercise if able. Is FU with ortho for leg pain. FU 4 months for recheck. - POCT glycosylated hemoglobin (Hb A1C) Results for orders placed or performed in visit on 07/26/16  POCT glycosylated hemoglobin (Hb A1C)  Result Value Ref Range   Hemoglobin A1C 5.5    Est. average glucose Bld gHb Est-mCnc 111      2. Adiposity Improving. Lost 2 lbs since LOV, and 9 lbs altogether according to our records. Recheck 4 months.  3. Essential hypertension Stable. Continue medications and lifestyle changes.  4. Thalassemia/mild anemia Stable    Patient seen and examined by Miguel Aschoff, MD, and note scribed by Renaldo Fiddler, CMA.  ,I have done the exam and reviewed the above chart and it is accurate to the best of my knowledge.  Stehanie Ekstrom Cranford Mon, MD  [facility]

## 2016-07-26 NOTE — Patient Instructions (Signed)

## 2016-08-18 ENCOUNTER — Ambulatory Visit: Payer: BLUE CROSS/BLUE SHIELD | Admitting: Urology

## 2016-08-18 ENCOUNTER — Encounter: Payer: Self-pay | Admitting: Urology

## 2016-10-18 DIAGNOSIS — G4733 Obstructive sleep apnea (adult) (pediatric): Secondary | ICD-10-CM | POA: Diagnosis not present

## 2016-10-27 ENCOUNTER — Other Ambulatory Visit: Payer: Self-pay | Admitting: *Deleted

## 2016-10-27 DIAGNOSIS — N400 Enlarged prostate without lower urinary tract symptoms: Secondary | ICD-10-CM

## 2016-10-28 ENCOUNTER — Encounter: Payer: Self-pay | Admitting: Urology

## 2016-10-28 ENCOUNTER — Other Ambulatory Visit
Admission: RE | Admit: 2016-10-28 | Discharge: 2016-10-28 | Disposition: A | Discharge status: Home / Self Care | Payer: BLUE CROSS/BLUE SHIELD | Source: Ambulatory Visit | Attending: Urology | Admitting: Urology

## 2016-10-28 ENCOUNTER — Ambulatory Visit (INDEPENDENT_AMBULATORY_CARE_PROVIDER_SITE_OTHER): Payer: BLUE CROSS/BLUE SHIELD | Admitting: Urology

## 2016-10-28 VITALS — BP 148/86 | HR 84 | Ht 66.0 in | Wt 288.5 lb

## 2016-10-28 DIAGNOSIS — R1030 Lower abdominal pain, unspecified: Secondary | ICD-10-CM

## 2016-10-28 DIAGNOSIS — N451 Epididymitis: Secondary | ICD-10-CM | POA: Diagnosis not present

## 2016-10-28 DIAGNOSIS — N138 Other obstructive and reflux uropathy: Secondary | ICD-10-CM

## 2016-10-28 DIAGNOSIS — N401 Enlarged prostate with lower urinary tract symptoms: Secondary | ICD-10-CM | POA: Diagnosis not present

## 2016-10-28 DIAGNOSIS — N400 Enlarged prostate without lower urinary tract symptoms: Secondary | ICD-10-CM | POA: Diagnosis not present

## 2016-10-28 LAB — PSA: PSA: 3.31 ng/mL (ref 0.00–4.00)

## 2016-10-28 LAB — BLADDER SCAN AMB NON-IMAGING: Scan Result: 139

## 2016-10-28 MED ORDER — SULFAMETHOXAZOLE-TRIMETHOPRIM 800-160 MG PO TABS: 1.0000 | ORAL_TABLET | Freq: Two times a day (BID) | ORAL | 0 refills | Status: DC

## 2016-10-28 MED ORDER — FINASTERIDE 5 MG PO TABS: 5.0000 mg | ORAL_TABLET | Freq: Every day | ORAL | 4 refills | Status: DC

## 2016-10-28 NOTE — Progress Notes (Signed)
10/28/2016 11:48 AM   Justin Soto 11-24-1961 [id number]  Referring provider: Jerrol Banana., MD [address] [place], [place] [address]  Chief Complaint  Patient presents with  . Groin Pain    HPI: Patient is a 55 year old Caucasian male who presents today for further evaluation of left sided groin pain.   Patient states that 1 week ago he developed the onset of left-sided groin pain. It radiates into the left scrotal area.  He has not had fevers, chills, nausea or vomiting. He is experiencing dysuria, but denies gross hematuria or suprapubic pain.    He has not had any trauma to the area.  He is sexually monogamous. He has not had any penile discharge.    He states that he is urinating after 10-15 times daily. He is getting up 3-4 times nightly. He is sleeping with a CPAP machine. He is currently on tamsulosin 0.4 mg daily. His PVR today is 139 mL.   PMH: Past Medical History:  Diagnosis Date  . Anemia   . Enlarged prostate    hx of  . Environmental and seasonal allergies   . Frequency of urination   . GERD (gastroesophageal reflux disease)   . History of kidney stones   . HOH (hard of hearing)    wears bilateral hearing aids  . Hypertension   . Irregular heart beat    Pt feels this every once in a while; PCP aware but stated it was OK  . Leg pain, right    Take Lyrica and Naproxen  . Neck pain of over 3 months duration   . Sleep apnea    NO CPAP  . Tick fever     Surgical History: Past Surgical History:  Procedure Laterality Date  . ANTERIOR CERVICAL DECOMP/DISCECTOMY FUSION N/A 11/13/2014   Procedure: Cervical three-four, Cervical six-seven anterior cervical decompression with fusion interbody prosthesis plating and bonegraft;  Surgeon: Newman Pies, MD;  Location: [place];  Service: Neurosurgery;  Laterality: N/A;  Cervical three-four, Cervical six-seven anterior cervical decompression with fusion interbody prosthesis plating and  bonegraft  . BACK SURGERY     Lumbar X 2  . COLONOSCOPY    . HERNIA REPAIR Left    groin  . LEG SURGERY Right    Had surgery on right calf X 5  . LITHOTRIPSY    . QUADRICEPS TENDON REPAIR Left 06/22/2015   Procedure: REPAIR QUADRICEP TENDON;  Surgeon: Earnestine Leys, MD;  Location: [facility];  Service: Orthopedics;  Laterality: Left;  Marland Kitchen QUADRICEPS TENDON REPAIR Left 08/12/2015   Procedure: REPAIR QUADRICEP TENDON;  Surgeon: Earnestine Leys, MD;  Location: [facility];  Service: Orthopedics;  Laterality: Left;  . ROTATOR CUFF REPAIR Left   . TONSILLECTOMY    . VASECTOMY     with hydrocele repaired    Home Medications:    Medication List       Accurate as of 10/28/16 11:48 AM. Always use your most recent med list.          amLODipine-benazepril 10-40 MG capsule Commonly known as:  LOTREL Take 1 capsule by mouth daily.   aspirin EC 81 MG tablet Take 81 mg by mouth daily.   cetirizine 10 MG tablet Commonly known as:  ZYRTEC 1 to 2 tablets daily as needed   finasteride 5 MG tablet Commonly known as:  PROSCAR Take 1 tablet (5 mg total) by mouth daily.   fluticasone 50 MCG/ACT nasal spray Commonly known as:  FLONASE Place 2 sprays into both nostrils daily.   hydrochlorothiazide 25 MG tablet Commonly known as:  HYDRODIURIL Take 1 tablet (25 mg total) by mouth daily.   ibuprofen 800 MG tablet Commonly known as:  ADVIL,MOTRIN Take 1 tablet (800 mg total) by mouth 3 (three) times daily as needed.   meloxicam 15 MG tablet Commonly known as:  MOBIC Take 15 mg by mouth daily.   metoprolol tartrate 25 MG tablet Commonly known as:  LOPRESSOR Take 1 tablet (25 mg total) by mouth 2 (two) times daily.   montelukast 10 MG tablet Commonly known as:  SINGULAIR Take 1 tablet (10 mg total) by mouth every morning.   multivitamin with minerals Tabs tablet Take 1 tablet by mouth daily.   omeprazole 10 MG capsule Commonly known as:  PRILOSEC Take 10 mg by mouth daily.     pregabalin 300 MG capsule Commonly known as:  LYRICA Take 1 capsule (300 mg total) by mouth 2 (two) times daily.   sulfamethoxazole-trimethoprim 800-160 MG tablet Commonly known as:  BACTRIM DS,SEPTRA DS Take 1 tablet by mouth every 12 (twelve) hours.   tamsulosin 0.4 MG Caps capsule Commonly known as:  FLOMAX Take 2 capsules (0.8 mg total) by mouth daily.       Allergies:  Allergies  Allergen Reactions  . Methocarbamol Other (See Comments)    Numbness, and change of taste   . Tizanidine Nausea Only    Family History: Family History  Problem Relation Age of Onset  . Prostate cancer Father   . Heart disease Father   . Diabetes Father   . Alzheimer's disease Father   . Anemia Mother   . Osteoporosis Mother   . Diabetes Brother   . Hypertension Brother   . Anemia Daughter   . Kidney disease Neg Hx   . Bladder Cancer Neg Hx     Social History:  reports that he has never smoked. He has never used smokeless tobacco. He reports that he does not drink alcohol or use drugs.  ROS: UROLOGY Frequent Urination?: No Hard to postpone urination?: No Burning/pain with urination?: Yes Get up at night to urinate?: Yes Leakage of urine?: No Urine stream starts and stops?: No Trouble starting stream?: No Do you have to strain to urinate?: No Blood in urine?: No Urinary tract infection?: No Sexually transmitted disease?: No Injury to kidneys or bladder?: No Painful intercourse?: No Weak stream?: No Erection problems?: No Penile pain?: No  Gastrointestinal Nausea?: No Vomiting?: No Indigestion/heartburn?: No Diarrhea?: No Constipation?: No  Constitutional Fever: No Night sweats?: No Weight loss?: No Fatigue?: No  Skin Skin rash/lesions?: No Itching?: No  Eyes Blurred vision?: No Double vision?: No  Ears/Nose/Throat Sore throat?: No Sinus problems?: No  Hematologic/Lymphatic Swollen glands?: No Easy bruising?: No  Cardiovascular Leg swelling?:  No Chest pain?: No  Respiratory Cough?: No Shortness of breath?: No  Endocrine Excessive thirst?: No  Musculoskeletal Back pain?: No Joint pain?: No  Neurological Headaches?: No Dizziness?: No  Psychologic Depression?: No Anxiety?: No  Physical Exam: BP (!) 148/86   Pulse 84   Ht 5\' 6"  (1.676 m)   Wt 288 lb 8 oz (130.9 kg)   BMI 46.57 kg/m   Constitutional: Well nourished. Alert and oriented, No acute distress. HEENT: [place] AT, moist mucus membranes. Trachea midline, no masses. Cardiovascular: No clubbing, cyanosis, or edema. Respiratory: Normal respiratory effort, no increased work of breathing. GI: Abdomen is soft, non tender, non distended, no abdominal masses. Liver and  spleen not palpable.  No hernias appreciated.  Stool sample for occult testing is not indicated.   GU: No CVA tenderness.  No bladder fullness or masses.  Patient with buried phallus.   Urethral meatus is patent.  No penile discharge. No penile lesions or rashes. Scrotum without lesions, cysts, rashes and/or edema.  Testicles are located scrotally bilaterally. No masses are appreciated in the testicles. Left epididymis is tender and indurated.  Right epididymis is normal. Rectal: Patient with  normal sphincter tone. Anus and perineum without scarring or rashes. No rectal masses are appreciated. Prostate is approximately 50 grams, no nodules are appreciated. Seminal vesicles are normal. Skin: No rashes, bruises or suspicious lesions. Lymph: No cervical or inguinal adenopathy. Neurologic: Grossly intact, no focal deficits, moving all 4 extremities. Psychiatric: Normal mood and affect.  Laboratory Data: Lab Results  Component Value Date   WBC 4.4 03/30/2016   HGB 11.6 (L) 11/04/2014   HCT 32.1 (L) 03/30/2016   MCV 61 (L) 03/30/2016   PLT 235 03/30/2016    Lab Results  Component Value Date   CREATININE 0.91 03/30/2016    Lab Results  Component Value Date   PSA 3.2 05/16/2014       PSA                         3.2                                                                   09/09/2015  Lab Results  Component Value Date   HGBA1C 5.5 07/26/2016    Lab Results  Component Value Date   TSH 1.130 03/30/2016       Component Value Date/Time   CHOL 133 03/30/2016 0946   HDL 23 (L) 03/30/2016 0946   LDLCALC 65 03/30/2016 0946    Lab Results  Component Value Date   AST 26 03/30/2016   Lab Results  Component Value Date   ALT 30 03/30/2016     Pertinent Imaging: Results for KASSON, MULHERN (MRN [id number]) as of 11/05/2016 14:51  Ref. Range 10/28/2016 11:37  Scan Result Unknown 139    Assessment & Plan:    1. Left epididymitis  - start Septra DS, one tablet twice daily for 2 weeks  - RTC in 2 weeks for exam  - discussed warning signs and to seek treatment in the ED if they should occur  2. BPH with LUTS  - Continue conservative management, avoiding bladder irritants and timed voiding's  - Initiate 5 alpha reductase inhibitor (finasteride 5 mg), discussed side effects  - Continue tamsulosin 0.4 mg daily  - RTC in two weeks for IPSS, PVR and exam   - BLADDER SCAN AMB NON-IMAGING   Return in about 2 weeks (around 11/11/2016) for exam, IPSS and PVR.  These notes generated with voice recognition software. I apologize for typographical errors.  Zara Council, [facility] [address], [place], [place] [address] [phone]

## 2016-10-31 ENCOUNTER — Telehealth: Payer: Self-pay | Admitting: *Deleted

## 2016-10-31 NOTE — Telephone Encounter (Signed)
Spoke with patient and gave results. 

## 2016-10-31 NOTE — Telephone Encounter (Signed)
-----   Message from Nori Riis, PA-C sent at 10/30/2016  7:56 PM EST ----- Please notify the patient that his PSA is stable.

## 2016-11-04 ENCOUNTER — Encounter: Payer: Self-pay | Admitting: Physician Assistant

## 2016-11-04 ENCOUNTER — Ambulatory Visit (INDEPENDENT_AMBULATORY_CARE_PROVIDER_SITE_OTHER): Payer: BLUE CROSS/BLUE SHIELD | Admitting: Physician Assistant

## 2016-11-04 VITALS — BP 130/70 | HR 90 | Temp 97.5°F | Resp 16 | Wt 291.0 lb

## 2016-11-04 DIAGNOSIS — R0602 Shortness of breath: Secondary | ICD-10-CM | POA: Diagnosis not present

## 2016-11-04 DIAGNOSIS — R5383 Other fatigue: Secondary | ICD-10-CM

## 2016-11-04 DIAGNOSIS — Z8249 Family history of ischemic heart disease and other diseases of the circulatory system: Secondary | ICD-10-CM

## 2016-11-04 NOTE — Progress Notes (Signed)
Patient: Justin Soto Male    DOB: 1961/02/01   55 y.o.   MRN: [id number] Visit Date: 11/04/2016  Today's Provider: Mar Daring, PA-C   Chief Complaint  Patient presents with  . Fatigue   Subjective:    HPI Patient is here today with c/o SOB and very fatigued. He reports that he has been feeling like this for the past three months but is getting worse. He reports it hurts in the middle of his chest and his back (epigastric region) sometimes with exertion. SOB always occurs with exertion. Even steps wear him out now.  He denies URI symptoms. He does have family history of CAD in his father. 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 He does have some mild lower extremity edema bilaterally and has had, but this has been thought to be caused by amlodipine. He denies any significant weight changes.   He does report one incident yesterday where he was out working and had to stop and come inside due to SOB. Denies chest pain with that episode.      Allergies  Allergen Reactions  . Methocarbamol Other (See Comments)    Numbness, and change of taste   . Tizanidine Nausea Only     Current Outpatient Prescriptions:  .  amLODipine-benazepril (LOTREL) 10-40 MG capsule, Take 1 capsule by mouth daily., Disp: 30 capsule, Rfl: 12 .  aspirin EC 81 MG tablet, Take 81 mg by mouth daily., Disp: , Rfl:  .  cetirizine (ZYRTEC) 10 MG tablet, 1 to 2 tablets daily as needed, Disp: 60 tablet, Rfl: 12 .  finasteride (PROSCAR) 5 MG tablet, Take 1 tablet (5 mg total) by mouth daily., Disp: 90 tablet, Rfl: 4 .  fluticasone (FLONASE) 50 MCG/ACT nasal spray, Place 2 sprays into both nostrils daily., Disp: 16 g, Rfl: 12 .  hydrochlorothiazide (HYDRODIURIL) 25 MG tablet, Take 1 tablet (25 mg total) by mouth daily., Disp: 30 tablet, Rfl: 12 .  ibuprofen (ADVIL,MOTRIN) 800 MG tablet, Take 1 tablet (800 mg total) by mouth 3 (three) times daily as needed., Disp: 90 tablet, Rfl: 3 .   meloxicam (MOBIC) 15 MG tablet, Take 15 mg by mouth daily., Disp: , Rfl:  .  metoprolol tartrate (LOPRESSOR) 25 MG tablet, Take 1 tablet (25 mg total) by mouth 2 (two) times daily., Disp: 60 tablet, Rfl: 3 .  montelukast (SINGULAIR) 10 MG tablet, Take 1 tablet (10 mg total) by mouth every morning., Disp: 30 tablet, Rfl: 12 .  Multiple Vitamin (MULTIVITAMIN WITH MINERALS) TABS tablet, Take 1 tablet by mouth daily., Disp: , Rfl:  .  omeprazole (PRILOSEC) 10 MG capsule, Take 10 mg by mouth daily., Disp: , Rfl:  .  pregabalin (LYRICA) 300 MG capsule, Take 1 capsule (300 mg total) by mouth 2 (two) times daily. (Patient taking differently: Take 300 mg by mouth daily. ), Disp: 60 capsule, Rfl: 5 .  tamsulosin (FLOMAX) 0.4 MG CAPS capsule, Take 2 capsules (0.8 mg total) by mouth daily., Disp: 30 capsule, Rfl: 12 .  sulfamethoxazole-trimethoprim (BACTRIM DS,SEPTRA DS) 800-160 MG tablet, Take 1 tablet by mouth every 12 (twelve) hours. (Patient not taking: Reported on 11/04/2016), Disp: 28 tablet, Rfl: 0  Review of Systems  Constitutional: Positive for activity change and fatigue.  HENT: Negative.   Respiratory: Positive for chest tightness and shortness of breath. Negative for cough and wheezing.   Cardiovascular: Positive for chest pain (occasional/epigastric area through to back).  Negative for palpitations and leg swelling.  Gastrointestinal: Negative.   Musculoskeletal: Negative.   Neurological: Negative.     Social History  Substance Use Topics  . Smoking status: Never Smoker  . Smokeless tobacco: Never Used  . Alcohol use No   Objective:   BP 130/70 (BP Location: Right Arm, Patient Position: Sitting, Cuff Size: Large)   Pulse 90   Temp 97.5 F (36.4 C) (Oral)   Resp 16   Wt 291 lb (132 kg)   BMI 46.97 kg/m   Physical Exam  Constitutional: He appears well-developed and well-nourished. No distress.  HENT:  Head: Normocephalic and atraumatic.  Neck: Normal range of motion. Neck  supple. No JVD present. No tracheal deviation present. No thyromegaly present.  Cardiovascular: Normal rate, regular rhythm and normal heart sounds.  Exam reveals no gallop and no friction rub.   No murmur heard. Pulmonary/Chest: Effort normal and breath sounds normal. No respiratory distress. He has no wheezes. He has no rales. He exhibits no tenderness.  Musculoskeletal: He exhibits edema (Trace to 1+ pitting edema bilaterally today).  Lymphadenopathy:    He has no cervical adenopathy.  Skin: He is not diaphoretic.  Vitals reviewed.      Assessment & Plan:     1. SOB (shortness of breath) on exertion EKG today was NSR rate of 77. No ST elevation, PACs, PVCs. Will check labs as below and f/u pending results. Patient does have h/o anemia so will make sure not a cause. Previous CXR from 10/29/15 was normal. 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 I did give him a sample inhaler of Breo for 14 days to see if he notices any improvement with treatment. He is to call the office if improvement so that we may send in Rx. I have also referred him back to Dr. Serafina Royals for consideration of stress test due to symptoms and family history. Patient has seen him before and had a stress test that was normal but reports that was many years ago. I will f/u pending results and inform Dr. Rosanna Randy of what we are doing as well.  - EKG 12-Lead - CBC w/Diff/Platelet - Basic Metabolic Panel (BMET) - B Nat Peptide - Ambulatory referral to Cardiology  2. Fatigue, unspecified type See above medical treatment plan. - EKG 12-Lead - CBC w/Diff/Platelet - Basic Metabolic Panel (BMET) - B Nat Peptide - Ambulatory referral to Cardiology  3. Family history of arteriosclerotic cardiovascular disease See above medical treatment plan. - Ambulatory referral to Cardiology       Mar Daring, PA-C  [facility]

## 2016-11-04 NOTE — Patient Instructions (Signed)
Shortness of Breath, Adult °Shortness of breath is when a person has trouble breathing enough air, or when a person feels like she or he is having trouble breathing in enough air. Shortness of breath could be a sign of medical problem. °Follow these instructions at home: °Pay attention to any changes in your symptoms. Take these actions to help with your condition: °· Do not smoke. Smoking is a common cause of shortness of breath. If you smoke and you need help quitting, ask your health care provider. °· Avoid things that can irritate your airways, such as: °¨ Mold. °¨ Dust. °¨ Air pollution. °¨ Chemical fumes. °¨ Things that can cause allergy symptoms (allergens), if you have allergies. °· Keep your living space clean and free of mold and dust. °· Rest as needed. Slowly return to your usual activities. °· Take over-the-counter and prescription medicines, including oxygen and inhaled medicines, only as told by your health care provider. °· Keep all follow-up visits as told by your health care provider. This is important. °Contact a health care provider if: °· Your condition does not improve as soon as expected. °· You have a hard time doing your normal activities, even after you rest. °· You have new symptoms. °Get help right away if: °· Your shortness of breath gets worse. °· You have shortness of breath when you are resting. °· You feel light-headed or you faint. °· You have a cough that is not controlled with medicines. °· You cough up blood. °· You have pain with breathing. °· You have pain in your chest, arms, shoulders, or abdomen. °· You have a fever. °· You cannot walk up stairs or exercise the way that you normally do. °This information is not intended to replace advice given to you by your health care provider. Make sure you discuss any questions you have with your health care provider. °Document Released: 08/23/2001 Document Revised: 06/18/2016 Document Reviewed: 05/05/2016 °Elsevier Interactive Patient  Education © 2017 [employer] ° °

## 2016-11-08 ENCOUNTER — Emergency Department
Admission: EM | Admit: 2016-11-08 | Discharge: 2016-11-08 | Disposition: A | Discharge status: Home / Self Care | Payer: BLUE CROSS/BLUE SHIELD | Attending: Emergency Medicine | Admitting: Emergency Medicine

## 2016-11-08 ENCOUNTER — Encounter: Payer: Self-pay | Admitting: Emergency Medicine

## 2016-11-08 ENCOUNTER — Telehealth: Payer: Self-pay | Admitting: Family Medicine

## 2016-11-08 DIAGNOSIS — F909 Attention-deficit hyperactivity disorder, unspecified type: Secondary | ICD-10-CM | POA: Diagnosis not present

## 2016-11-08 DIAGNOSIS — Z79899 Other long term (current) drug therapy: Secondary | ICD-10-CM | POA: Diagnosis not present

## 2016-11-08 DIAGNOSIS — I10 Essential (primary) hypertension: Secondary | ICD-10-CM | POA: Diagnosis not present

## 2016-11-08 DIAGNOSIS — E782 Mixed hyperlipidemia: Secondary | ICD-10-CM | POA: Diagnosis not present

## 2016-11-08 DIAGNOSIS — R5383 Other fatigue: Secondary | ICD-10-CM | POA: Diagnosis not present

## 2016-11-08 DIAGNOSIS — Z139 Encounter for screening, unspecified: Secondary | ICD-10-CM

## 2016-11-08 DIAGNOSIS — Z7982 Long term (current) use of aspirin: Secondary | ICD-10-CM | POA: Insufficient documentation

## 2016-11-08 DIAGNOSIS — Z0000 Encounter for general adult medical examination without abnormal findings: Secondary | ICD-10-CM | POA: Diagnosis not present

## 2016-11-08 DIAGNOSIS — Z791 Long term (current) use of non-steroidal anti-inflammatories (NSAID): Secondary | ICD-10-CM | POA: Insufficient documentation

## 2016-11-08 DIAGNOSIS — R0602 Shortness of breath: Secondary | ICD-10-CM | POA: Diagnosis not present

## 2016-11-08 DIAGNOSIS — R002 Palpitations: Secondary | ICD-10-CM | POA: Diagnosis not present

## 2016-11-08 DIAGNOSIS — G4733 Obstructive sleep apnea (adult) (pediatric): Secondary | ICD-10-CM | POA: Insufficient documentation

## 2016-11-08 LAB — BASIC METABOLIC PANEL
BUN/Creatinine Ratio: 17 (ref 9–20)
BUN: 20 mg/dL (ref 6–24)
CO2: 23 mmol/L (ref 18–29)
Calcium: 9.1 mg/dL (ref 8.7–10.2)
Chloride: 99 mmol/L (ref 96–106)
Creatinine, Ser: 1.16 mg/dL (ref 0.76–1.27)
GFR calc Af Amer: 81 mL/min/{1.73_m2} (ref >59)
GFR calc non Af Amer: 70 mL/min/{1.73_m2} (ref >59)
Glucose: 110 mg/dL — ABNORMAL HIGH (ref 65–99)
Potassium: 5.3 mmol/L — ABNORMAL HIGH (ref 3.5–5.2)
Sodium: 137 mmol/L (ref 134–144)

## 2016-11-08 LAB — CBC WITH DIFFERENTIAL/PLATELET
Basophils Absolute: 0.2 10*3/uL (ref 0.0–0.2)
Basophils Absolute: 0 10*3/uL (ref 0–0.1)
Basophils Relative: 1 %
Basos: 4 %
EOS (ABSOLUTE): 0.3 10*3/uL (ref 0.0–0.4)
Eos: 7 %
Eosinophils Absolute: 0.2 10*3/uL (ref 0–0.7)
Eosinophils Relative: 4 %
HCT: 33.4 % — ABNORMAL LOW (ref 40.0–52.0)
Hematocrit: 33.8 % — ABNORMAL LOW (ref 37.5–51.0)
Hemoglobin: 10.8 g/dL — ABNORMAL LOW (ref 12.6–17.7)
Hemoglobin: 10.9 g/dL — ABNORMAL LOW (ref 13.0–18.0)
Lymphocytes Absolute: 2.6 10*3/uL (ref 0.7–3.1)
Lymphocytes Relative: 27 %
Lymphs Abs: 1.2 10*3/uL (ref 1.0–3.6)
Lymphs: 64 %
MCH: 19.7 pg — ABNORMAL LOW (ref 26.6–33.0)
MCH: 20.2 pg — ABNORMAL LOW (ref 26.0–34.0)
MCHC: 32 g/dL (ref 31.5–35.7)
MCHC: 32.6 g/dL (ref 32.0–36.0)
MCV: 62 fL — ABNORMAL LOW (ref 79–97)
MCV: 62 fL — ABNORMAL LOW (ref 80.0–100.0)
Monocytes Absolute: 0.9 10*3/uL (ref 0.1–0.9)
Monocytes Absolute: 0.5 10*3/uL (ref 0.2–1.0)
Monocytes Relative: 13 %
Monocytes: 22 %
Neutro Abs: 2.3 10*3/uL (ref 1.4–6.5)
Neutrophils Absolute: 0.1 10*3/uL — CL (ref 1.4–7.0)
Neutrophils Relative %: 55 %
Neutrophils: 3 %
Platelets: 186 10*3/uL (ref 150–379)
Platelets: 167 10*3/uL (ref 150–440)
RBC: 5.48 x10E6/uL (ref 4.14–5.80)
RBC: 5.39 MIL/uL (ref 4.40–5.90)
RDW: 18.6 % — ABNORMAL HIGH (ref 12.3–15.4)
RDW: 16.2 % — ABNORMAL HIGH (ref 11.5–14.5)
WBC: 4.1 10*3/uL (ref 3.4–10.8)
WBC: 4.2 10*3/uL (ref 3.8–10.6)

## 2016-11-08 LAB — COMPREHENSIVE METABOLIC PANEL
ALT: 32 U/L (ref 17–63)
AST: 34 U/L (ref 15–41)
Albumin: 4.2 g/dL (ref 3.5–5.0)
Alkaline Phosphatase: 46 U/L (ref 38–126)
Anion gap: 8 (ref 5–15)
BUN: 22 mg/dL — ABNORMAL HIGH (ref 6–20)
CO2: 25 mmol/L (ref 22–32)
Calcium: 9.1 mg/dL (ref 8.9–10.3)
Chloride: 103 mmol/L (ref 101–111)
Creatinine, Ser: 1.03 mg/dL (ref 0.61–1.24)
GFR calc Af Amer: >60 mL/min (ref >60)
GFR calc non Af Amer: >60 mL/min (ref >60)
Glucose, Bld: 112 mg/dL — ABNORMAL HIGH (ref 65–99)
Potassium: 3.9 mmol/L (ref 3.5–5.1)
Sodium: 136 mmol/L (ref 135–145)
Total Bilirubin: 0.8 mg/dL (ref 0.3–1.2)
Total Protein: 7.9 g/dL (ref 6.5–8.1)

## 2016-11-08 LAB — RAPID HIV SCREEN (HIV 1/2 AB+AG)
HIV 1/2 Antibodies: NONREACTIVE
HIV-1 P24 Antigen - HIV24: NONREACTIVE

## 2016-11-08 LAB — BRAIN NATRIURETIC PEPTIDE: BNP: <2.5 pg/mL (ref 0.0–100.0)

## 2016-11-08 NOTE — ED Triage Notes (Addendum)
Patient ambulatory to triage with steady gait, without difficulty or distress noted; pt reports seen by PCP on Friday for fatigue; called this morning and told to come to ED for WBC 0.1; mask placed on pt and taken immed to room 14; MD notified and precaution sign placed on pt door

## 2016-11-08 NOTE — Telephone Encounter (Signed)
Please review.  Thanks,  -Joseline 

## 2016-11-08 NOTE — Telephone Encounter (Signed)
fyi

## 2016-11-08 NOTE — Telephone Encounter (Signed)
Pt is requesting a call back about lab results.  WP:[id number]

## 2016-11-08 NOTE — ED Provider Notes (Signed)
Assumed care from [place], labs are grossly unremarkable and within his normal limits. There was likely an outpatient lab air. He is stable for outpatient follow-up. Labs Reviewed  CBC WITH DIFFERENTIAL/PLATELET - Abnormal; Notable for the following:       Result Value   Hemoglobin 10.9 (*)    HCT 33.4 (*)    MCV 62.0 (*)    MCH 20.2 (*)    RDW 16.2 (*)    All other components within normal limits  COMPREHENSIVE METABOLIC PANEL - Abnormal; Notable for the following:    Glucose, Bld 112 (*)    BUN 22 (*)    All other components within normal limits  RAPID HIV SCREEN (HIV 1/2 AB+AG)      Earleen Newport, MD 11/08/16 [phone]

## 2016-11-08 NOTE — Telephone Encounter (Signed)
After review, patient was found to have a neutrophil count of >0.1 on labs drawn on 11/04/16. This was resulted this morning at 0400 and Dr. Sanda Klein (on call doctor) was notified of critical lab. She called patient and informed him to go to ER for lab verification. Patient did go to ER and neutrophil count was normal at 2.3 and HIV was non-reactive. Patient was informed of lab error and was offered an appointment with me or Dr. Rosanna Randy for next week, if needed. He is seeing Dr. Nehemiah Massed today and will call if he feels he needs to be seen again after that appointment.

## 2016-11-11 ENCOUNTER — Other Ambulatory Visit: Payer: Self-pay

## 2016-11-11 MED ORDER — METOPROLOL TARTRATE 25 MG PO TABS: 25.0000 mg | ORAL_TABLET | Freq: Two times a day (BID) | ORAL | 3 refills | Status: DC

## 2016-11-14 ENCOUNTER — Encounter: Payer: Self-pay | Admitting: Urology

## 2016-11-14 ENCOUNTER — Ambulatory Visit: Payer: BLUE CROSS/BLUE SHIELD | Admitting: Urology

## 2016-11-14 VITALS — BP 122/76 | HR 74 | Ht 66.0 in | Wt 288.3 lb

## 2016-11-14 DIAGNOSIS — N451 Epididymitis: Secondary | ICD-10-CM | POA: Diagnosis not present

## 2016-11-14 DIAGNOSIS — N400 Enlarged prostate without lower urinary tract symptoms: Secondary | ICD-10-CM

## 2016-11-14 DIAGNOSIS — N138 Other obstructive and reflux uropathy: Secondary | ICD-10-CM

## 2016-11-14 DIAGNOSIS — N401 Enlarged prostate with lower urinary tract symptoms: Secondary | ICD-10-CM | POA: Diagnosis not present

## 2016-11-14 LAB — BLADDER SCAN AMB NON-IMAGING: Scan Result: 0

## 2016-11-14 NOTE — Progress Notes (Signed)
11/14/2016 10:08 AM   Justin Soto [birth year] [id number]  Referring provider: Jerrol Banana., MD [address] [place], [place] [address]  Chief Complaint  Patient presents with  . Benign Prostatic Hypertrophy    also Left Epididymitis 2 week follow up    HPI: Patient is a 55 year old Caucasian male who presents today for 2 week recheck on epididymitis.  Background Patient presented for further evaluation of left sided groin pain.   Patient states that 1 week ago he developed the onset of left-sided groin pain. It radiates into the left scrotal area.  He has not had fevers, chills, nausea or vomiting. He is experiencing dysuria, but denies gross hematuria or suprapubic pain.  He has not had any trauma to the area.  He is sexually monogamous. He has not had any penile discharge.  He states that he is urinating after 10-15 times daily. He is getting up 3-4 times nightly. He is sleeping with a CPAP machine. He is currently on tamsulosin 0.4 mg daily. His PVR today is 139 mL.    Left epididymitis Patient states that the epididymitis cleared four days after the antibiotic was started.    BPH WITH LUTS His IPSS score today is 17, which is moderate lower urinary tract symptomatology. He is unhappy with his quality life due to his urinary symptoms.  His PVR is 0 mL.  His previous PVR is 139 mL.  His major complaints today are frequency and nocturia x 3.  He has had these symptoms for several years.  He denies any dysuria, hematuria or suprapubic pain.   He currently taking tamsulosin 0.8 mg daily and finasteride 5 mg daily.   He also denies any recent fevers, chills, nausea or vomiting.   His father had prostate cancer.        IPSS    Row Name 11/14/16 0900         International Prostate Symptom Score   How often have you had the sensation of not emptying your bladder? More than half the time     How often have you had to urinate less than every two hours? Almost  always     How often have you found you stopped and started again several times when you urinated? Less than half the time     How often have you found it difficult to postpone urination? Not at All     How often have you had a weak urinary stream? Less than half the time     How often have you had to strain to start urination? Less than 1 in 5 times     How many times did you typically get up at night to urinate? 3 Times     Total IPSS Score 17       Quality of Life due to urinary symptoms   If you were to spend the rest of your life with your urinary condition just the way it is now how would you feel about that? Unhappy        Score:  1-7 Mild 8-19 Moderate 20-35 Severe    PMH: Past Medical History:  Diagnosis Date  . Anemia   . Enlarged prostate    hx of  . Environmental and seasonal allergies   . Frequency of urination   . GERD (gastroesophageal reflux disease)   . History of kidney stones   . HOH (hard of hearing)    wears bilateral hearing aids  .  Hypertension   . Irregular heart beat    Pt feels this every once in a while; PCP aware but stated it was OK  . Leg pain, right    Take Lyrica and Naproxen  . Neck pain of over 3 months duration   . Sleep apnea    NO CPAP  . Tick fever     Surgical History: Past Surgical History:  Procedure Laterality Date  . ANTERIOR CERVICAL DECOMP/DISCECTOMY FUSION N/A 11/13/2014   Procedure: Cervical three-four, Cervical six-seven anterior cervical decompression with fusion interbody prosthesis plating and bonegraft;  Surgeon: Newman Pies, MD;  Location: [facility];  Service: Neurosurgery;  Laterality: N/A;  Cervical three-four, Cervical six-seven anterior cervical decompression with fusion interbody prosthesis plating and bonegraft  . BACK SURGERY     Lumbar X 2  . COLONOSCOPY    . HERNIA REPAIR Left    groin  . LEG SURGERY Right    Had surgery on right calf X 5  . LITHOTRIPSY    . QUADRICEPS TENDON REPAIR Left 06/22/2015    Procedure: REPAIR QUADRICEP TENDON;  Surgeon: Earnestine Leys, MD;  Location: [facility];  Service: Orthopedics;  Laterality: Left;  Marland Kitchen QUADRICEPS TENDON REPAIR Left 08/12/2015   Procedure: REPAIR QUADRICEP TENDON;  Surgeon: Earnestine Leys, MD;  Location: [facility];  Service: Orthopedics;  Laterality: Left;  . ROTATOR CUFF REPAIR Left   . TONSILLECTOMY    . VASECTOMY     with hydrocele repaired    Home Medications:    Medication List       Accurate as of 11/14/16 10:08 AM. Always use your most recent med list.          amLODipine-benazepril 10-40 MG capsule Commonly known as:  LOTREL Take 1 capsule by mouth daily.   aspirin EC 81 MG tablet Take 81 mg by mouth daily.   cetirizine 10 MG tablet Commonly known as:  ZYRTEC 1 to 2 tablets daily as needed   finasteride 5 MG tablet Commonly known as:  PROSCAR Take 1 tablet (5 mg total) by mouth daily.   fluticasone 50 MCG/ACT nasal spray Commonly known as:  FLONASE Place 2 sprays into both nostrils daily.   fluticasone furoate-vilanterol 100-25 MCG/INH Aepb Commonly known as:  BREO ELLIPTA Inhale into the lungs.   hydrochlorothiazide 25 MG tablet Commonly known as:  HYDRODIURIL Take 1 tablet (25 mg total) by mouth daily.   ibuprofen 800 MG tablet Commonly known as:  ADVIL,MOTRIN Take 1 tablet (800 mg total) by mouth 3 (three) times daily as needed.   meloxicam 15 MG tablet Commonly known as:  MOBIC Take 15 mg by mouth daily.   metoprolol tartrate 25 MG tablet Commonly known as:  LOPRESSOR Take 1 tablet (25 mg total) by mouth 2 (two) times daily.   montelukast 10 MG tablet Commonly known as:  SINGULAIR Take 1 tablet (10 mg total) by mouth every morning.   multivitamin with minerals Tabs tablet Take 1 tablet by mouth daily.   omeprazole 10 MG capsule Commonly known as:  PRILOSEC Take 10 mg by mouth daily.   pregabalin 300 MG capsule Commonly known as:  LYRICA Take 1 capsule (300 mg total) by mouth 2 (two) times  daily.   sulfamethoxazole-trimethoprim 800-160 MG tablet Commonly known as:  BACTRIM DS,SEPTRA DS Take by mouth.   tamsulosin 0.4 MG Caps capsule Commonly known as:  FLOMAX Take 2 capsules (0.8 mg total) by mouth daily.       Allergies:  Allergies  Allergen Reactions  . Methocarbamol Other (See Comments)    Numbness, and change of taste   . Tizanidine Nausea Only    Family History: Family History  Problem Relation Age of Onset  . Prostate cancer Father   . Heart disease Father   . Diabetes Father   . Alzheimer's disease Father   . Anemia Mother   . Osteoporosis Mother   . Diabetes Brother   . Hypertension Brother   . Anemia Daughter   . Kidney disease Neg Hx   . Bladder Cancer Neg Hx     Social History:  reports that he has never smoked. He has never used smokeless tobacco. He reports that he does not drink alcohol or use drugs.  ROS: UROLOGY Frequent Urination?: Yes Hard to postpone urination?: No Burning/pain with urination?: No Get up at night to urinate?: Yes Leakage of urine?: No Urine stream starts and stops?: No Trouble starting stream?: No Do you have to strain to urinate?: No Blood in urine?: No Urinary tract infection?: No Sexually transmitted disease?: No Injury to kidneys or bladder?: No Painful intercourse?: No Weak stream?: No Erection problems?: No Penile pain?: No  Gastrointestinal Nausea?: No Vomiting?: No Indigestion/heartburn?: No Diarrhea?: No Constipation?: No  Constitutional Fever: No Night sweats?: No Weight loss?: No Fatigue?: No  Skin Skin rash/lesions?: No Itching?: No  Eyes Blurred vision?: No Double vision?: No  Ears/Nose/Throat Sore throat?: No Sinus problems?: No  Hematologic/Lymphatic Swollen glands?: No Easy bruising?: No  Cardiovascular Leg swelling?: No Chest pain?: No  Respiratory Cough?: No Shortness of breath?: No  Endocrine Excessive thirst?: No  Musculoskeletal Back pain?:  No Joint pain?: No  Neurological Headaches?: No Dizziness?: No  Psychologic Depression?: No Anxiety?: No  Physical Exam: BP 122/76   Pulse 74   Ht 5\' 6"  (1.676 m)   Wt 288 lb 4.8 oz (130.8 kg)   BMI 46.53 kg/m   Constitutional: Well nourished. Alert and oriented, No acute distress. HEENT:  AT, moist mucus membranes. Trachea midline, no masses. Cardiovascular: No clubbing, cyanosis, or edema. Respiratory: Normal respiratory effort, no increased work of breathing. GI: Abdomen is soft, non tender, non distended, no abdominal masses. Liver and spleen not palpable.  No hernias appreciated.  Stool sample for occult testing is not indicated.   GU: No CVA tenderness.  No bladder fullness or masses.  Patient with buried phallus.   Urethral meatus is patent.  No penile discharge. No penile lesions or rashes. Scrotum without lesions, cysts, rashes and/or edema.  Testicles are located scrotally bilaterally. No masses are appreciated in the testicles. Left epididymis and right epididymis is normal. Rectal: Deferred.   Skin: No rashes, bruises or suspicious lesions. Lymph: No cervical or inguinal adenopathy. Neurologic: Grossly intact, no focal deficits, moving all 4 extremities. Psychiatric: Normal mood and affect.  Laboratory Data: Lab Results  Component Value Date   WBC 4.2 11/08/2016   HGB 10.9 (L) 11/08/2016   HCT 33.4 (L) 11/08/2016   MCV 62.0 (L) 11/08/2016   PLT 167 11/08/2016    Lab Results  Component Value Date   CREATININE 1.03 11/08/2016    Lab Results  Component Value Date   PSA 3.31 10/28/2016   PSA 3.2 05/16/2014       PSA                        3.2  09/09/2015  Lab Results  Component Value Date   HGBA1C 5.5 07/26/2016    Lab Results  Component Value Date   TSH 1.130 03/30/2016       Component Value Date/Time   CHOL 133 03/30/2016 0946   HDL 23 (L) 03/30/2016 0946   LDLCALC 65  03/30/2016 0946    Lab Results  Component Value Date   AST 34 11/08/2016   Lab Results  Component Value Date   ALT 32 11/08/2016    Pertinent Imaging: Results for TRAMARION, MCPHEETERS (MRN [id number]) as of 11/14/2016 10:09  Ref. Range 11/14/2016 09:53  Scan Result Unknown 0     Assessment & Plan:    1. Left epididymitis  - resolved  2. BPH with LUTS  - Continue conservative management, avoiding bladder irritants and timed voiding's  - continue 5 alpha reductase inhibitor (finasteride 5 mg), discussed side effects  - Continue tamsulosin 0.4 mg daily  - RTC in 3 months for IPSS, PVR, PSA and exam   - BLADDER SCAN AMB NON-IMAGING  Return in about 3 months (around 02/12/2017) for IPSS, PVR, PSA and exam.  These notes generated with voice recognition software. I apologize for typographical errors.  Zara Council, [facility] [address], [place], [place] [address] [phone]

## 2016-11-15 NOTE — ED Provider Notes (Signed)
[facility] Emergency Department Provider Note  ____________________________________________  Time seen: Approximately 8:19 PM  I have reviewed the triage vital signs and the nursing notes.   HISTORY  Chief Complaint Abnormal Lab   HPI CHANG DONALD is a 55 y.o. male with history is documented below who was sent by his primary care doctor for concerns of low WBC.Patient went to see his PCP for months of fatigue. Received a phone call today saying that his white count was 0.1 and told to come to the emergency room. Patient denies any recent illness, history of HIV, tuberculosis, cancer. Patient is not on chemotherapy. Patient denies any chest pain, shortness of breath, headache, abdominal pain, nausea, vomiting, diarrhea.  Past Medical History:  Diagnosis Date  . Anemia   . Enlarged prostate    hx of  . Environmental and seasonal allergies   . Frequency of urination   . GERD (gastroesophageal reflux disease)   . History of kidney stones   . HOH (hard of hearing)    wears bilateral hearing aids  . Hypertension   . Irregular heart beat    Pt feels this every once in a while; PCP aware but stated it was OK  . Leg pain, right    Take Lyrica and Naproxen  . Neck pain of over 3 months duration   . Sleep apnea    NO CPAP  . Tick fever     Patient Active Problem List   Diagnosis Date Noted  . Hyperglycemia 07/26/2016  . ADD (attention deficit disorder) 09/08/2015  . Absolute anemia 09/08/2015  . Achilles bursitis 09/08/2015  . Back pain, thoracic 09/08/2015  . Benign prostatic hypertrophy with lower urinary tract symptoms (LUTS) 09/08/2015  . Cellulitis 09/08/2015  . Clinical depression 09/08/2015  . Deep vein thrombosis ([place]) 09/08/2015  . Acid reflux 09/08/2015  . Bulge of cervical disc without myelopathy 09/08/2015  . 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 BP (high blood pressure)  09/08/2015  . Cannot sleep 09/08/2015  . Anemia, iron deficiency 09/08/2015  . Bad memory 09/08/2015  . Adiposity 09/08/2015  . Allergic rhinitis, seasonal 09/08/2015  . 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 Fast heart beat 09/08/2015  . Cervical spondylosis with myelopathy and radiculopathy 11/13/2014  . Hernia, inguinal, unilateral 04/06/2009    Past Surgical History:  Procedure Laterality Date  . ANTERIOR CERVICAL DECOMP/DISCECTOMY FUSION N/A 11/13/2014   Procedure: Cervical three-four, Cervical six-seven anterior cervical decompression with fusion interbody prosthesis plating and bonegraft;  Surgeon: Newman Pies, MD;  Location: [place];  Service: Neurosurgery;  Laterality: N/A;  Cervical three-four, Cervical six-seven anterior cervical decompression with fusion interbody prosthesis plating and bonegraft  . BACK SURGERY     Lumbar X 2  . COLONOSCOPY    . HERNIA REPAIR Left    groin  . LEG SURGERY Right    Had surgery on right calf X 5  . LITHOTRIPSY    . QUADRICEPS TENDON REPAIR Left 06/22/2015   Procedure: REPAIR QUADRICEP TENDON;  Surgeon: Earnestine Leys, MD;  Location: [facility];  Service: Orthopedics;  Laterality: Left;  Marland Kitchen QUADRICEPS TENDON REPAIR Left 08/12/2015   Procedure: REPAIR QUADRICEP TENDON;  Surgeon: Earnestine Leys, MD;  Location: [facility];  Service: Orthopedics;  Laterality: Left;  . ROTATOR CUFF REPAIR Left   . TONSILLECTOMY    . VASECTOMY     with hydrocele repaired    Prior to Admission medications  Medication Sig Start Date End Date Taking? Authorizing Provider  amLODipine-benazepril (LOTREL) 10-40 MG capsule Take 1 capsule by mouth daily. 02/16/16   Jerrol Banana., MD  aspirin EC 81 MG tablet Take 81 mg by mouth daily.    Historical Provider, MD  cetirizine (ZYRTEC) 10 MG tablet 1 to 2 tablets daily as needed 03/08/16   Jerrol Banana., MD  finasteride (PROSCAR) 5 MG tablet Take 1 tablet (5 mg total) by mouth daily. 10/28/16   [name] A McGowan, PA-C    fluticasone (FLONASE) 50 MCG/ACT nasal spray Place 2 sprays into both nostrils daily. 04/18/16   Richard Maceo Pro., MD  fluticasone furoate-vilanterol (BREO ELLIPTA) 100-25 MCG/INH AEPB Inhale into the lungs.    Historical Provider, MD  hydrochlorothiazide (HYDRODIURIL) 25 MG tablet Take 1 tablet (25 mg total) by mouth daily. 06/15/16   Richard Maceo Pro., MD  ibuprofen (ADVIL,MOTRIN) 800 MG tablet Take 1 tablet (800 mg total) by mouth 3 (three) times daily as needed. 03/08/16   Richard Maceo Pro., MD  meloxicam (MOBIC) 15 MG tablet Take 15 mg by mouth daily.    Historical Provider, MD  metoprolol tartrate (LOPRESSOR) 25 MG tablet Take 1 tablet (25 mg total) by mouth 2 (two) times daily. 11/11/16   Richard Maceo Pro., MD  montelukast (SINGULAIR) 10 MG tablet Take 1 tablet (10 mg total) by mouth every morning. 05/23/16   Richard Maceo Pro., MD  Multiple Vitamin (MULTIVITAMIN WITH MINERALS) TABS tablet Take 1 tablet by mouth daily.    Historical Provider, MD  omeprazole (PRILOSEC) 10 MG capsule Take 10 mg by mouth daily.    Historical Provider, MD  pregabalin (LYRICA) 300 MG capsule Take 1 capsule (300 mg total) by mouth 2 (two) times daily. Patient taking differently: Take 300 mg by mouth daily.  04/22/16   Richard Maceo Pro., MD  sulfamethoxazole-trimethoprim (BACTRIM DS,SEPTRA DS) 800-160 MG tablet Take by mouth.    Historical Provider, MD  tamsulosin (FLOMAX) 0.4 MG CAPS capsule Take 2 capsules (0.8 mg total) by mouth daily. 12/18/15   Roda Shutters, FNP    Allergies Methocarbamol and Tizanidine  Family History  Problem Relation Age of Onset  . Prostate cancer Father   . Heart disease Father   . Diabetes Father   . Alzheimer's disease Father   . Anemia Mother   . Osteoporosis Mother   . Diabetes Brother   . Hypertension Brother   . Anemia Daughter   . Kidney disease Neg Hx   . Bladder Cancer Neg Hx     Social History Social History  Substance Use Topics  . Smoking  status: Never Smoker  . Smokeless tobacco: Never Used  . Alcohol use No    Review of Systems  Constitutional: Negative for fever. + fatigue Eyes: Negative for visual changes. ENT: Negative for sore throat. Neck: No neck pain  Cardiovascular: Negative for chest pain. Respiratory: Negative for shortness of breath. Gastrointestinal: Negative for abdominal pain, vomiting or diarrhea. Genitourinary: Negative for dysuria. Musculoskeletal: Negative for back pain. Skin: Negative for rash. Neurological: Negative for headaches, weakness or numbness. Psych: No SI or HI  ____________________________________________   PHYSICAL EXAM:  VITAL SIGNS: ED Triage Vitals  Enc Vitals Group     BP 11/08/16 0555 (!) 153/77     Pulse Rate 11/08/16 0555 (!) 106     Resp 11/08/16 0555 (!) 22     Temp 11/08/16 0555 98.2 F (36.8 C)  Temp Source 11/08/16 0555 Oral     SpO2 11/08/16 0555 96 %     Weight 11/08/16 0552 280 lb (127 kg)     Height 11/08/16 0552 5\' 6"  (1.676 m)     Head Circumference --      Peak Flow --      Pain Score --      Pain Loc --      Pain Edu? --      Excl. in [place]? --     Constitutional: Alert and oriented. Well appearing and in no apparent distress. HEENT:      Head: Normocephalic and atraumatic.         Eyes: Conjunctivae are normal. Sclera is non-icteric. EOMI. PERRL      Mouth/Throat: Mucous membranes are moist.       Neck: Supple with no signs of meningismus. Cardiovascular: Regular rate and rhythm. No murmurs, gallops, or rubs. 2+ symmetrical distal pulses are present in all extremities. No JVD. Respiratory: Normal respiratory effort. Lungs are clear to auscultation bilaterally. No wheezes, crackles, or rhonchi.  Gastrointestinal: Soft, non tender, and non distended with positive bowel sounds. No rebound or guarding. Genitourinary: No CVA tenderness. Musculoskeletal: Nontender with normal range of motion in all extremities. No edema, cyanosis, or erythema of  extremities. Neurologic: Normal speech and language. Face is symmetric. Moving all extremities. No gross focal neurologic deficits are appreciated. Skin: Skin is warm, dry and intact. No rash noted. Psychiatric: Mood and affect are normal. Speech and behavior are normal.  ____________________________________________   LABS (all labs ordered are listed, but only abnormal results are displayed)  Labs Reviewed  CBC WITH DIFFERENTIAL/PLATELET - Abnormal; Notable for the following:       Result Value   Hemoglobin 10.9 (*)    HCT 33.4 (*)    MCV 62.0 (*)    MCH 20.2 (*)    RDW 16.2 (*)    All other components within normal limits  COMPREHENSIVE METABOLIC PANEL - Abnormal; Notable for the following:    Glucose, Bld 112 (*)    BUN 22 (*)    All other components within normal limits  RAPID HIV SCREEN (HIV 1/2 AB+AG)   ____________________________________________  EKG  none ____________________________________________  RADIOLOGY  none  ____________________________________________   PROCEDURES  Procedure(s) performed: None Procedures Critical Care performed:  None ____________________________________________   INITIAL IMPRESSION / ASSESSMENT AND PLAN / ED COURSE  55 y.o. male with history is documented below who was sent by his primary care doctor for concerns of low WBC. Will repeat blood work. Care transferred to Dr. Jimmye Norman  Clinical Course     Pertinent labs & imaging results that were available during my care of the patient were reviewed by me and considered in my medical decision making (see chart for details).    ____________________________________________   FINAL CLINICAL IMPRESSION(S) / ED DIAGNOSES  Final diagnoses:  Encounter for medical screening examination      NEW MEDICATIONS STARTED DURING THIS VISIT:  Discharge Medication List as of 11/08/2016  7:36 AM       Note:  This document was prepared using Dragon voice recognition software and  may include unintentional dictation errors.    Rudene Re, MD 11/15/16 2032

## 2016-11-28 ENCOUNTER — Ambulatory Visit (INDEPENDENT_AMBULATORY_CARE_PROVIDER_SITE_OTHER): Payer: BLUE CROSS/BLUE SHIELD | Admitting: Family Medicine

## 2016-11-28 ENCOUNTER — Encounter: Payer: Self-pay | Admitting: Family Medicine

## 2016-11-28 VITALS — BP 124/70 | HR 86 | Temp 98.2°F | Resp 16 | Wt 292.0 lb

## 2016-11-28 DIAGNOSIS — M25511 Pain in right shoulder: Secondary | ICD-10-CM

## 2016-11-28 DIAGNOSIS — R739 Hyperglycemia, unspecified: Secondary | ICD-10-CM | POA: Diagnosis not present

## 2016-11-28 DIAGNOSIS — M4722 Other spondylosis with radiculopathy, cervical region: Secondary | ICD-10-CM

## 2016-11-28 DIAGNOSIS — E669 Obesity, unspecified: Secondary | ICD-10-CM | POA: Diagnosis not present

## 2016-11-28 DIAGNOSIS — M546 Pain in thoracic spine: Secondary | ICD-10-CM | POA: Diagnosis not present

## 2016-11-28 DIAGNOSIS — R5383 Other fatigue: Secondary | ICD-10-CM

## 2016-11-28 DIAGNOSIS — M4712 Other spondylosis with myelopathy, cervical region: Secondary | ICD-10-CM

## 2016-11-28 DIAGNOSIS — G8929 Other chronic pain: Secondary | ICD-10-CM | POA: Diagnosis not present

## 2016-11-28 DIAGNOSIS — I10 Essential (primary) hypertension: Secondary | ICD-10-CM | POA: Diagnosis not present

## 2016-11-28 DIAGNOSIS — R0602 Shortness of breath: Secondary | ICD-10-CM | POA: Diagnosis not present

## 2016-11-28 DIAGNOSIS — M79604 Pain in right leg: Secondary | ICD-10-CM

## 2016-11-28 LAB — POCT GLYCOSYLATED HEMOGLOBIN (HGB A1C): Hemoglobin A1C: 5.6

## 2016-11-28 NOTE — Progress Notes (Signed)
Subjective:  HPI  Hypertension, follow-up:  BP Readings from Last 3 Encounters:  11/28/16 124/70  11/14/16 122/76  11/08/16 127/88    He was last seen for hypertension 4 months ago.  BP at that visit was 122/76. Management since that visit includes nonr. He reports good compliance with treatment. He is not having side effects.   Outside blood pressures are running about what they are in office today. He is experiencing dyspnea and fatigue.  Patient denies chest pain, chest pressure/discomfort, claudication, exertional chest pressure/discomfort, irregular heart beat, lower extremity edema, near-syncope, orthopnea, palpitations, paroxysmal nocturnal dyspnea and syncope.    Wt Readings from Last 3 Encounters:  11/28/16 292 lb (132.5 kg)  11/14/16 288 lb 4.8 oz (130.8 kg)  11/08/16 280 lb (127 kg)   ------------------------------------------------------------------------   Pre- Diabetes, Follow-up:   Lab Results  Component Value Date   HGBA1C 5.5 07/26/2016   HGBA1C 6.4 (H) 03/30/2016    Pertinent Labs:    Component Value Date/Time   CHOL 133 03/30/2016 0946   TRIG 227 (H) 03/30/2016 0946   HDL 23 (L) 03/30/2016 0946   LDLCALC 65 03/30/2016 0946   CREATININE 1.03 11/08/2016 0631   CREATININE 1.01 08/11/2014 1230    Wt Readings from Last 3 Encounters:  11/28/16 292 lb (132.5 kg)  11/14/16 288 lb 4.8 oz (130.8 kg)  11/08/16 280 lb (127 kg)    ------------------------------------------------------------------------ Shortness of breath- Pt was here on 11/07/16 for shortness of breath. He reports that it is about the same as before. He has seen cardiology but has not completed the stress test yet. He is scheduled for this This coming wednesday.    Prior to Admission medications   Medication Sig Start Date End Date Taking? Authorizing Provider  amLODipine-benazepril (LOTREL) 10-40 MG capsule Take 1 capsule by mouth daily. 02/16/16  Yes Richard Maceo Pro., MD    aspirin EC 81 MG tablet Take 81 mg by mouth daily.   Yes Historical Provider, MD  cetirizine (ZYRTEC) 10 MG tablet 1 to 2 tablets daily as needed 03/08/16  Yes Richard Maceo Pro., MD  finasteride (PROSCAR) 5 MG tablet Take 1 tablet (5 mg total) by mouth daily. 10/28/16  Yes Shannon A McGowan, PA-C  fluticasone (FLONASE) 50 MCG/ACT nasal spray Place 2 sprays into both nostrils daily. 04/18/16  Yes Richard Maceo Pro., MD  fluticasone furoate-vilanterol (BREO ELLIPTA) 100-25 MCG/INH AEPB Inhale into the lungs.   Yes Historical Provider, MD  hydrochlorothiazide (HYDRODIURIL) 25 MG tablet Take 1 tablet (25 mg total) by mouth daily. 06/15/16  Yes Richard Maceo Pro., MD  ibuprofen (ADVIL,MOTRIN) 800 MG tablet Take 1 tablet (800 mg total) by mouth 3 (three) times daily as needed. 03/08/16  Yes Richard Maceo Pro., MD  meloxicam (MOBIC) 15 MG tablet Take 15 mg by mouth daily.   Yes Historical Provider, MD  metoprolol tartrate (LOPRESSOR) 25 MG tablet Take 1 tablet (25 mg total) by mouth 2 (two) times daily. 11/11/16  Yes Richard Maceo Pro., MD  montelukast (SINGULAIR) 10 MG tablet Take 1 tablet (10 mg total) by mouth every morning. 05/23/16  Yes Richard Maceo Pro., MD  Multiple Vitamin (MULTIVITAMIN WITH MINERALS) TABS tablet Take 1 tablet by mouth daily.   Yes Historical Provider, MD  omeprazole (PRILOSEC) 10 MG capsule Take 10 mg by mouth daily.   Yes Historical Provider, MD  pregabalin (LYRICA) 300 MG capsule Take 1 capsule (300 mg total) by mouth 2 (  two) times daily. Patient taking differently: Take 300 mg by mouth daily.  04/22/16  Yes Richard Maceo Pro., MD  tamsulosin (FLOMAX) 0.4 MG CAPS capsule Take 2 capsules (0.8 mg total) by mouth daily. 12/18/15  Yes Roda Shutters, FNP  sulfamethoxazole-trimethoprim (BACTRIM DS,SEPTRA DS) 800-160 MG tablet Take by mouth.    Historical Provider, MD    Patient Active Problem List   Diagnosis Date Noted  . OSA (obstructive sleep apnea) 11/08/2016   . Hyperglycemia 07/26/2016  . ADD (attention deficit disorder) 09/08/2015  . Absolute anemia 09/08/2015  . Achilles bursitis 09/08/2015  . Back pain, thoracic 09/08/2015  . Benign prostatic hypertrophy with lower urinary tract symptoms (LUTS) 09/08/2015  . Cellulitis 09/08/2015  . Clinical depression 09/08/2015  . Deep vein thrombosis ([place]) 09/08/2015  . Acid reflux 09/08/2015  . Bulge of cervical disc without myelopathy 09/08/2015  . 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 BP (high blood pressure) 09/08/2015  . Cannot sleep 09/08/2015  . Anemia, iron deficiency 09/08/2015  . Bad memory 09/08/2015  . Adiposity 09/08/2015  . Allergic rhinitis, seasonal 09/08/2015  . 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 Fast heart beat 09/08/2015  . Cervical spondylosis with myelopathy and radiculopathy 11/13/2014  . Hernia, inguinal, unilateral 04/06/2009    Past Medical History:  Diagnosis Date  . Anemia   . Enlarged prostate    hx of  . Environmental and seasonal allergies   . Frequency of urination   . GERD (gastroesophageal reflux disease)   . History of kidney stones   . HOH (hard of hearing)    wears bilateral hearing aids  . Hypertension   . Irregular heart beat    Pt feels this every once in a while; PCP aware but stated it was OK  . Leg pain, right    Take Lyrica and Naproxen  . Neck pain of over 3 months duration   . Sleep apnea    NO CPAP  . Tick fever     Social History   Social History  . Marital status: Married    Spouse name: N/A  . Number of children: N/A  . Years of education: N/A   Occupational History  . Not on file.   Social History Main Topics  . Smoking status: Never Smoker  . Smokeless tobacco: Never Used  . Alcohol use No  . Drug use: No  . Sexual activity: Not on file   Other Topics Concern  . Not on file   Social History Narrative  . No narrative on file    Allergies  Allergen  Reactions  . Methocarbamol Other (See Comments)    Numbness, and change of taste   . Tizanidine Nausea Only    Review of Systems  Constitutional: Positive for malaise/fatigue.  HENT: Negative.   Eyes: Negative.   Respiratory: Positive for shortness of breath.   Cardiovascular: Negative.   Gastrointestinal: Negative.   Genitourinary: Negative.   Musculoskeletal: Positive for back pain, joint pain and neck pain.  Skin: Negative.   Neurological: Negative.   Endo/Heme/Allergies: Negative.   Psychiatric/Behavioral: Negative.     Immunization History  Administered Date(s) Administered  . Tdap 01/03/2013    Objective:  BP 124/70 (BP Location: Left Arm, Patient Position: Sitting, Cuff Size: Large)   Pulse 86   Temp 98.2 F (36.8 C) (Oral)   Resp 16   Wt 292 lb (132.5 kg)   BMI 47.13 kg/m  Physical Exam  Constitutional: He is oriented to person, place, and time and well-developed, well-nourished, and in no distress.  HENT:  Head: Normocephalic and atraumatic.  Right Ear: External ear normal.  Left Ear: External ear normal.  Nose: Nose normal.  Eyes: Conjunctivae and EOM are normal. Pupils are equal, round, and reactive to light.  Neck: Normal range of motion. Neck supple.  Cardiovascular: Normal rate, regular rhythm, normal heart sounds and intact distal pulses.   Pulmonary/Chest: Effort normal and breath sounds normal.  Abdominal: Soft.  Musculoskeletal: Normal range of motion. He exhibits tenderness (tender over the right shoulder joint, pain with abduction).  Neurological: He is alert and oriented to person, place, and time. He has normal reflexes. Gait normal. GCS score is 15.  Skin: Skin is warm and dry.  Psychiatric: Mood, memory, affect and judgment normal.    Lab Results  Component Value Date   WBC 4.2 11/08/2016   HGB 10.9 (L) 11/08/2016   HCT 33.4 (L) 11/08/2016   PLT 167 11/08/2016   GLUCOSE 112 (H) 11/08/2016   CHOL 133 03/30/2016   TRIG 227 (H)  03/30/2016   HDL 23 (L) 03/30/2016   LDLCALC 65 03/30/2016   TSH 1.130 03/30/2016   PSA 3.31 10/28/2016   INR 1.0 08/11/2014   HGBA1C 5.5 07/26/2016    CMP     Component Value Date/Time   NA 136 11/08/2016 0631   NA 137 11/04/2016 0916   NA 138 08/11/2014 1230   K 3.9 11/08/2016 0631   K 4.1 08/11/2014 1230   CL 103 11/08/2016 0631   CL 104 08/11/2014 1230   CO2 25 11/08/2016 0631   CO2 28 08/11/2014 1230   GLUCOSE 112 (H) 11/08/2016 0631   GLUCOSE 104 (H) 08/11/2014 1230   BUN 22 (H) 11/08/2016 0631   BUN 20 11/04/2016 0916   BUN 11 08/11/2014 1230   CREATININE 1.03 11/08/2016 0631   CREATININE 1.01 08/11/2014 1230   CALCIUM 9.1 11/08/2016 0631   CALCIUM 8.5 08/11/2014 1230   PROT 7.9 11/08/2016 0631   PROT 7.7 03/30/2016 0946   PROT 7.7 08/11/2014 1230   ALBUMIN 4.2 11/08/2016 0631   ALBUMIN 4.1 03/30/2016 0946   ALBUMIN 4.0 08/11/2014 1230   AST 34 11/08/2016 0631   AST 16 08/11/2014 1230   ALT 32 11/08/2016 0631   ALT 28 08/11/2014 1230   ALKPHOS 46 11/08/2016 0631   ALKPHOS 55 08/11/2014 1230   BILITOT 0.8 11/08/2016 0631   BILITOT 0.5 03/30/2016 0946   BILITOT 0.6 08/11/2014 1230   GFRNONAA >60 11/08/2016 0631   GFRNONAA >60 08/11/2014 1230   GFRAA >60 11/08/2016 0631   GFRAA >60 08/11/2014 1230    Assessment and Plan :  1. Hyperglycemia  - POCT HgB A1C 5.6 today. Discuss diet and exercise. Refer to [facility].   - Ambulatory referral to diabetic education  2. Essential hypertension Stable.   3. Obesity, unspecified classification, unspecified obesity type, unspecified whether serious comorbidity present  - Ambulatory referral to diabetic education  4. Fatigue, unspecified type Labs earlier in the year normal.  5. SOB (shortness of breath) on exertion   6. Chronic thoracic back pain, unspecified back pain laterality   7. Cervical spondylosis with myelopathy and radiculopathy   8. Leg pain, right  - Ambulatory referral to  Orthopedic Surgery  9. Acute pain of right shoulder/Arthropathy  - Ambulatory referral to Orthopedic Surgery   HPI, Exam, and A&P Transcribed under the direction and in the  presence of Richard L. Cranford Mon, MD  Electronically Signed: Webb Laws, CMA I have done the exam and reviewed the above chart and it is accurate to the best of my knowledge. Development worker, community has been used in this note in any air is in the dictation or transcription are unintentional.  [facility] 11/28/2016 8:52 AM

## 2016-11-30 DIAGNOSIS — R0602 Shortness of breath: Secondary | ICD-10-CM | POA: Diagnosis not present

## 2016-12-07 DIAGNOSIS — R0602 Shortness of breath: Secondary | ICD-10-CM | POA: Diagnosis not present

## 2016-12-07 DIAGNOSIS — M79671 Pain in right foot: Secondary | ICD-10-CM | POA: Diagnosis not present

## 2016-12-07 DIAGNOSIS — G4733 Obstructive sleep apnea (adult) (pediatric): Secondary | ICD-10-CM | POA: Diagnosis not present

## 2016-12-07 DIAGNOSIS — I10 Essential (primary) hypertension: Secondary | ICD-10-CM | POA: Diagnosis not present

## 2016-12-13 DIAGNOSIS — M5412 Radiculopathy, cervical region: Secondary | ICD-10-CM | POA: Diagnosis not present

## 2016-12-13 DIAGNOSIS — M7541 Impingement syndrome of right shoulder: Secondary | ICD-10-CM | POA: Diagnosis not present

## 2016-12-14 ENCOUNTER — Ambulatory Visit (INDEPENDENT_AMBULATORY_CARE_PROVIDER_SITE_OTHER): Payer: BLUE CROSS/BLUE SHIELD | Admitting: Family Medicine

## 2016-12-14 VITALS — BP 142/84 | HR 92 | Temp 98.8°F | Resp 20 | Wt 296.0 lb

## 2016-12-14 DIAGNOSIS — G4709 Other insomnia: Secondary | ICD-10-CM

## 2016-12-14 DIAGNOSIS — J42 Unspecified chronic bronchitis: Secondary | ICD-10-CM

## 2016-12-14 DIAGNOSIS — E669 Obesity, unspecified: Secondary | ICD-10-CM

## 2016-12-14 DIAGNOSIS — I10 Essential (primary) hypertension: Secondary | ICD-10-CM | POA: Diagnosis not present

## 2016-12-14 DIAGNOSIS — D508 Other iron deficiency anemias: Secondary | ICD-10-CM

## 2016-12-14 MED ORDER — AZITHROMYCIN 250 MG PO TABS: ORAL_TABLET | ORAL | 0 refills | Status: DC

## 2016-12-14 MED ORDER — HYDROCOD POLST-CPM POLST ER 10-8 MG/5ML PO SUER: 5.0000 mL | Freq: Two times a day (BID) | ORAL | 0 refills | Status: DC | PRN

## 2016-12-14 MED ORDER — DIAZEPAM 5 MG PO TABS: 5.0000 mg | ORAL_TABLET | Freq: Every evening | ORAL | 5 refills | Status: DC | PRN

## 2016-12-14 NOTE — Progress Notes (Signed)
Justin Soto  MRN: [id number] DOB: 1961/11/25  Subjective:  HPI  Patient started to feel bad for about 3-4 days. Symptoms are hoarse, dyspnea, post nasal drainage, ear pain, sore throat, head pressure, headache, some chills. Has a slight cough during the day but gets worse when he lays down in the night to sleep-has not been able to sleep well. No fever. Patient Active Problem List   Diagnosis Date Noted  . OSA (obstructive sleep apnea) 11/08/2016  . Hyperglycemia 07/26/2016  . ADD (attention deficit disorder) 09/08/2015  . Absolute anemia 09/08/2015  . Achilles bursitis 09/08/2015  . Back pain, thoracic 09/08/2015  . Benign prostatic hypertrophy with lower urinary tract symptoms (LUTS) 09/08/2015  . Cellulitis 09/08/2015  . Clinical depression 09/08/2015  . Deep vein thrombosis ([place]) 09/08/2015  . Acid reflux 09/08/2015  . Bulge of cervical disc without myelopathy 09/08/2015  . 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 BP (high blood pressure) 09/08/2015  . Cannot sleep 09/08/2015  . Anemia, iron deficiency 09/08/2015  . Bad memory 09/08/2015  . Adiposity 09/08/2015  . Allergic rhinitis, seasonal 09/08/2015  . 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 Fast heart beat 09/08/2015  . Cervical spondylosis with myelopathy and radiculopathy 11/13/2014  . Hernia, inguinal, unilateral 04/06/2009    Past Medical History:  Diagnosis Date  . Anemia   . Enlarged prostate    hx of  . Environmental and seasonal allergies   . Frequency of urination   . GERD (gastroesophageal reflux disease)   . History of kidney stones   . HOH (hard of hearing)    wears bilateral hearing aids  . Hypertension   . Irregular heart beat    Pt feels this every once in a while; PCP aware but stated it was OK  . Leg pain, right    Take Lyrica and Naproxen  . Neck pain of over 3 months duration   . Sleep apnea    NO CPAP  . Tick fever     Social  History   Social History  . Marital status: Married    Spouse name: N/A  . Number of children: N/A  . Years of education: N/A   Occupational History  . Not on file.   Social History Main Topics  . Smoking status: Never Smoker  . Smokeless tobacco: Never Used  . Alcohol use No  . Drug use: No  . Sexual activity: Not on file   Other Topics Concern  . Not on file   Social History Narrative  . No narrative on file    Outpatient Encounter Prescriptions as of 12/14/2016  Medication Sig Note  . amLODipine-benazepril (LOTREL) 10-40 MG capsule Take 1 capsule by mouth daily.   Marland Kitchen aspirin EC 81 MG tablet Take 81 mg by mouth daily.   . cetirizine (ZYRTEC) 10 MG tablet 1 to 2 tablets daily as needed   . finasteride (PROSCAR) 5 MG tablet Take 1 tablet (5 mg total) by mouth daily.   . fluticasone (FLONASE) 50 MCG/ACT nasal spray Place 2 sprays into both nostrils daily.   . hydrochlorothiazide (HYDRODIURIL) 25 MG tablet Take 1 tablet (25 mg total) by mouth daily.   Marland Kitchen ibuprofen (ADVIL,MOTRIN) 800 MG tablet Take 1 tablet (800 mg total) by mouth 3 (three) times daily as needed.   . meloxicam (MOBIC) 15 MG tablet Take 15 mg by mouth daily.   . metoprolol tartrate (LOPRESSOR) 25 MG tablet Take  1 tablet (25 mg total) by mouth 2 (two) times daily.   . montelukast (SINGULAIR) 10 MG tablet Take 1 tablet (10 mg total) by mouth every morning.   . Multiple Vitamin (MULTIVITAMIN WITH MINERALS) TABS tablet Take 1 tablet by mouth daily.   Marland Kitchen omeprazole (PRILOSEC) 10 MG capsule Take 10 mg by mouth daily.   . pregabalin (LYRICA) 300 MG capsule Take 1 capsule (300 mg total) by mouth 2 (two) times daily. (Patient taking differently: Take 300 mg by mouth daily. )   . tamsulosin (FLOMAX) 0.4 MG CAPS capsule Take 2 capsules (0.8 mg total) by mouth daily.   . [DISCONTINUED] fluticasone furoate-vilanterol (BREO ELLIPTA) 100-25 MCG/INH AEPB Inhale into the lungs. 11/14/2016: Received from: [facility]: Inhale 1 inhalation into the lungs once daily.  . [DISCONTINUED] sulfamethoxazole-trimethoprim (BACTRIM DS,SEPTRA DS) 800-160 MG tablet Take by mouth. 11/14/2016: Received from: [facility]: Take 1 tablet by mouth 2 (two) times daily.   No facility-administered encounter medications on file as of 12/14/2016.     Allergies  Allergen Reactions  . Methocarbamol Other (See Comments)    Numbness, and change of taste   . Tizanidine Nausea Only    Review of Systems  Constitutional: Positive for chills and malaise/fatigue.  HENT: Positive for congestion, ear pain, sinus pain and sore throat.   Respiratory: Positive for cough, sputum production and shortness of breath.   Cardiovascular: Negative.   Musculoskeletal: Positive for joint pain and myalgias.  Neurological: Positive for weakness.    Objective:  BP (!) 142/84   Pulse 92   Temp 98.8 F (37.1 C)   Resp 20   Wt 296 lb (134.3 kg)   SpO2 96%   BMI 47.78 kg/m   Physical Exam  Constitutional: He is oriented to person, place, and time and well-developed, well-nourished, and in no distress.  HENT:  Head: Normocephalic and atraumatic.  Right Ear: External ear normal.  Left Ear: External ear normal.  Eyes: Conjunctivae are normal. Pupils are equal, round, and reactive to light.  Neck: Normal range of motion. Neck supple.  Cardiovascular: Normal rate, regular rhythm, normal heart sounds and intact distal pulses.  Exam reveals no gallop and no friction rub.   No murmur heard. Pulmonary/Chest: Effort normal and breath sounds normal. No respiratory distress. He has no wheezes.  Neurological: He is alert and oriented to person, place, and time.  Psychiatric: Mood, memory, affect and judgment normal.    Assessment and Plan :  1. Chronic bronchitis, unspecified chronic bronchitis type ([place]) This is most likely viral but due to patient's history will go ahead and treat as bronchitis with Zpak  and tussionex cough syrup for cough. Advised patient on the days he is taking Tussionex not to take Diazepam on those days, patient verbalized understanding.Follow as needed.  2. Obesity, unspecified classification, unspecified obesity type, unspecified whether serious comorbidity present  3. Essential hypertension  4. Other insomnia Patient wants to re start Diazepam. Advised patient of risks with taking this medication and will not prescribe this long term.   5. Other iron deficiency anemia  HPI, Exam and A&P transcribed under direction and in the presence of Miguel Aschoff, MD. I have done the exam and reviewed the chart and it is accurate to the best of my knowledge. Development worker, community has been used and  any errors in dictation or transcription are unintentional. Miguel Aschoff M.D. [facility]

## 2016-12-19 DIAGNOSIS — R002 Palpitations: Secondary | ICD-10-CM | POA: Diagnosis not present

## 2016-12-23 DIAGNOSIS — M7661 Achilles tendinitis, right leg: Secondary | ICD-10-CM | POA: Diagnosis not present

## 2016-12-23 DIAGNOSIS — M5412 Radiculopathy, cervical region: Secondary | ICD-10-CM | POA: Diagnosis not present

## 2016-12-23 DIAGNOSIS — M7541 Impingement syndrome of right shoulder: Secondary | ICD-10-CM | POA: Diagnosis not present

## 2016-12-26 ENCOUNTER — Other Ambulatory Visit: Payer: Self-pay | Admitting: Specialist

## 2016-12-30 ENCOUNTER — Encounter: Payer: BLUE CROSS/BLUE SHIELD | Attending: Family Medicine | Admitting: Dietician

## 2016-12-30 ENCOUNTER — Encounter: Payer: Self-pay | Admitting: Dietician

## 2016-12-30 VITALS — Ht 66.0 in | Wt 284.2 lb

## 2016-12-30 DIAGNOSIS — Z6841 Body Mass Index (BMI) 40.0 and over, adult: Secondary | ICD-10-CM

## 2016-12-30 DIAGNOSIS — R739 Hyperglycemia, unspecified: Secondary | ICD-10-CM

## 2016-12-30 DIAGNOSIS — E661 Drug-induced obesity: Secondary | ICD-10-CM

## 2016-12-30 NOTE — Progress Notes (Signed)
Medical Nutrition Therapy: Visit start time: 1000  end time: 1100  Assessment:  Diagnosis: hyperglycemia and weight management Past medical history: HTN, sleep apnea Psychosocial issues/ stress concerns: h/o anxiety Preferred learning method:  . Auditory . Visual . Hands-on  Current weight: 284.2 lbs with shoes  Height: 5\' 6"  Medications, supplements: reviewed in medical record with patient  Progress and evaluation: Patient reports history of weight loss on weight watchers program, lost over 100lbs about 10years ago. Has had some leg injuries in past months which has limited physcial activity and was also on steroid medications which increased appetite and subsequently weight. His blood sugar has also increased since gaining weight in the past months. He reports frequent dining out due to work schedule; he and his wife often eat separately due to schedules. He will be having leg surgery on 01/11/17.   Physical activity: none at this time due to foot pain  Dietary Intake:  Usual eating pattern includes 3 meals and 3 snacks per day. Dining out frequency: unknown meals per week.  Breakfast: scrambled eggs flatbread cheese Snack: peanut butter crackers, cookies with peanut butter, lowfat yogurt Lunch: alternate cooking duties; chili with [place], pork chop, [place] breast Snack: same as am Supper: often out; [employer], etc. Chicken tenders, chili. Tries to eat by 6pm Snack: peanut butter cookies Beverages: diet pepsi, other diet drinks. Also caffeine free.   Nutrition Care Education: Topics covered: weight management, hyperglycemia Basic nutrition: basic food groups, appropriate nutrient balance, appropriate meal and snack schedule, general nutrition guidelines    Weight control: benefits of weight control, specifically improved BGs and BP: behavioral changes for weight loss including strategies for portion control and promoting fulness; provided guidance for 1700kcal daily intake with 50%CHO,  20% protein, and 30% fat. Advanced nutrition: dining out, food label reading Diabetes: appropriate carb intake and balance; basic meal planning using food models and plate method Hypertension:  importance of controlling sodium intake   Nutritional Diagnosis:  [place]-2.2 Altered nutrition-related laboratory As related to hyperglycemia.  As evidenced by patient report. [place]-3.3 Overweight/obesity As related to medications, excess calories, inactivity.  As evidenced by patient report.  Intervention: Instruction as noted above.   Set goals with patient input.    Patient has resumed some healthy food choices recently, and is motivated to further improve diet.    Scheduled follow-up in about 6 weeks to allow recovery from surgery.   Education Materials given:  . General diet guidelines for Diabetes . Dining out resource: Eating Healthy When [employer] . Food lists/ Planning A Balanced Meal . Sample meal pattern/ menus: Quick and Healthy Meal Ideas . Goals/ instructions  Learner/ who was taught:  . Patient   Level of understanding: Marland Kitchen Verbalizes/ demonstrates competency  Demonstrated degree of understanding via:   Teach back Learning barriers: . None  Willingness to learn/ readiness for change: . Eager, change in progress  Monitoring and Evaluation:  Dietary intake, exercise, BG control, and body weight      follow up: 02/06/17

## 2016-12-30 NOTE — Patient Instructions (Signed)
   Work on portion control by keeping starches to 1 cup (fist-size) or less at each meal. Eat generous portions of veggies to fill your plate.   Limit cheese, leave it out when possible, or limit portion, or choose some lowfat version.   Choose healthy, low-cal snacks such as fruit and 1/4 cup nuts, high fiber granola bar, graham crackers with a small amount of peanut butter, yogurt alone or with fruit.   Check nutrition info for restaurant meals online; check total carbohydrate on food labels to control intake also.

## 2017-01-05 ENCOUNTER — Encounter
Admission: RE | Admit: 2017-01-05 | Discharge: 2017-01-05 | Disposition: A | Discharge status: Home / Self Care | Payer: BLUE CROSS/BLUE SHIELD | Source: Ambulatory Visit | Attending: Specialist | Admitting: Specialist

## 2017-01-05 DIAGNOSIS — K4090 Unilateral inguinal hernia, without obstruction or gangrene, not specified as recurrent: Secondary | ICD-10-CM | POA: Diagnosis not present

## 2017-01-05 DIAGNOSIS — Z0189 Encounter for other specified special examinations: Secondary | ICD-10-CM | POA: Insufficient documentation

## 2017-01-05 DIAGNOSIS — N200 Calculus of kidney: Secondary | ICD-10-CM | POA: Diagnosis not present

## 2017-01-05 DIAGNOSIS — M5136 Other intervertebral disc degeneration, lumbar region: Secondary | ICD-10-CM | POA: Insufficient documentation

## 2017-01-05 LAB — CBC
HCT: 33.5 % — ABNORMAL LOW (ref 40.0–52.0)
Hemoglobin: 10.9 g/dL — ABNORMAL LOW (ref 13.0–18.0)
MCH: 20.1 pg — ABNORMAL LOW (ref 26.0–34.0)
MCHC: 32.5 g/dL (ref 32.0–36.0)
MCV: 62 fL — ABNORMAL LOW (ref 80.0–100.0)
Platelets: 156 10*3/uL (ref 150–440)
RBC: 5.41 MIL/uL (ref 4.40–5.90)
RDW: 16.4 % — ABNORMAL HIGH (ref 11.5–14.5)
WBC: 5.6 10*3/uL (ref 3.8–10.6)

## 2017-01-05 LAB — BASIC METABOLIC PANEL
Anion gap: 7 (ref 5–15)
BUN: 17 mg/dL (ref 6–20)
CO2: 30 mmol/L (ref 22–32)
Calcium: 9.1 mg/dL (ref 8.9–10.3)
Chloride: 100 mmol/L — ABNORMAL LOW (ref 101–111)
Creatinine, Ser: 0.97 mg/dL (ref 0.61–1.24)
GFR calc Af Amer: >60 mL/min (ref >60)
GFR calc non Af Amer: >60 mL/min (ref >60)
Glucose, Bld: 93 mg/dL (ref 65–99)
Potassium: 4.4 mmol/L (ref 3.5–5.1)
Sodium: 137 mmol/L (ref 135–145)

## 2017-01-05 NOTE — Patient Instructions (Signed)
Your procedure is scheduled on: Wednesday 01/11/17 Report to [facility]. 2ND FLOOR MEDICAL MALL ENTRANCE. To find out your arrival time please call [phone] between 1PM - 3PM on Tuesday 01/10/17.  Remember: Instructions that are not followed completely may result in serious medical risk, up to and including death, or upon the discretion of your surgeon and anesthesiologist your surgery may need to be rescheduled.    __X__ 1. Do not eat food or drink liquids after midnight. No gum chewing or hard candies.     __X__ 2. No Alcohol for 24 hours before or after surgery.   ____ 3. Bring all medications with you on the day of surgery if instructed.    __X__ 4. Notify your doctor if there is any change in your medical condition     (cold, fever, infections).             __X___5. No smoking within 24 hours of your surgery.     Do not wear jewelry, make-up, hairpins, clips or nail polish.  Do not wear lotions, powders, or perfumes.   Do not shave 48 hours prior to surgery. Men may shave face and neck.  Do not bring valuables to the hospital.    [facility] is not responsible for any belongings or valuables.               Contacts, dentures or bridgework may not be worn into surgery.  Leave your suitcase in the car. After surgery it may be brought to your room.  For patients admitted to the hospital, discharge time is determined by your                treatment team.   Patients discharged the day of surgery will not be allowed to drive home.   Please read over the following fact sheets that you were given:   Pain Booklet and MRSA Information   __X__ Take these medicines the morning of surgery with A SIP OF WATER:    1. AMLODIPINE  2. FINASTERIDE  3. METOPROLOL  4. MONTELUKAST  5. CETIRIZINE  6. OMEPRAZOLE  7. TAMSULOSIN  ____ Fleet Enema (as directed)   __X__ Use CHG Soap as directed  ____ Use inhalers on the day of surgery  ____ Stop metformin 2 days prior to  surgery    ____ Take 1/2 of usual insulin dose the night before surgery and none on the morning of surgery.   __X__ Stop Coumadin/Plavix/aspirin on ALREADY STOPPED  __X__ Stop Anti-inflammatories such as Advil, Aleve, Ibuprofen, Motrin, Naproxen, Naprosyn, Goodies,powder, or aspirin products.  OK to take Tylenol.   ____ Stop supplements until after surgery.    __X__ Bring C-Pap to the hospital.

## 2017-01-06 ENCOUNTER — Ambulatory Visit: Payer: BLUE CROSS/BLUE SHIELD | Admitting: Family Medicine

## 2017-01-06 ENCOUNTER — Telehealth: Payer: Self-pay

## 2017-01-06 ENCOUNTER — Other Ambulatory Visit
Admission: RE | Admit: 2017-01-06 | Discharge: 2017-01-06 | Disposition: A | Discharge status: Home / Self Care | Payer: BLUE CROSS/BLUE SHIELD | Source: Ambulatory Visit | Attending: Urology | Admitting: Urology

## 2017-01-06 ENCOUNTER — Ambulatory Visit (INDEPENDENT_AMBULATORY_CARE_PROVIDER_SITE_OTHER): Payer: BLUE CROSS/BLUE SHIELD | Admitting: Urology

## 2017-01-06 ENCOUNTER — Ambulatory Visit
Admission: RE | Admit: 2017-01-06 | Discharge: 2017-01-06 | Disposition: A | Discharge status: Home / Self Care | Payer: BLUE CROSS/BLUE SHIELD | Source: Ambulatory Visit | Attending: Urology | Admitting: Urology

## 2017-01-06 ENCOUNTER — Encounter: Payer: Self-pay | Admitting: Urology

## 2017-01-06 VITALS — BP 150/94 | HR 96 | Ht 66.0 in | Wt 284.0 lb

## 2017-01-06 DIAGNOSIS — N200 Calculus of kidney: Secondary | ICD-10-CM | POA: Diagnosis not present

## 2017-01-06 DIAGNOSIS — R10A0 Flank pain, unspecified side: Secondary | ICD-10-CM

## 2017-01-06 DIAGNOSIS — N401 Enlarged prostate with lower urinary tract symptoms: Secondary | ICD-10-CM | POA: Diagnosis not present

## 2017-01-06 DIAGNOSIS — K4090 Unilateral inguinal hernia, without obstruction or gangrene, not specified as recurrent: Secondary | ICD-10-CM | POA: Diagnosis not present

## 2017-01-06 DIAGNOSIS — R109 Unspecified abdominal pain: Secondary | ICD-10-CM

## 2017-01-06 DIAGNOSIS — M5136 Other intervertebral disc degeneration, lumbar region: Secondary | ICD-10-CM | POA: Diagnosis not present

## 2017-01-06 DIAGNOSIS — Z0189 Encounter for other specified special examinations: Secondary | ICD-10-CM | POA: Diagnosis not present

## 2017-01-06 DIAGNOSIS — N138 Other obstructive and reflux uropathy: Secondary | ICD-10-CM

## 2017-01-06 DIAGNOSIS — Z87442 Personal history of urinary calculi: Secondary | ICD-10-CM

## 2017-01-06 LAB — URINALYSIS, COMPLETE (UACMP) WITH MICROSCOPIC
Bacteria, UA: NONE SEEN
Bilirubin Urine: NEGATIVE
Glucose, UA: NEGATIVE mg/dL
Hgb urine dipstick: NEGATIVE
Ketones, ur: NEGATIVE mg/dL
Leukocytes, UA: NEGATIVE
Nitrite: NEGATIVE
Protein, ur: NEGATIVE mg/dL
Specific Gravity, Urine: 1.015 (ref 1.005–1.030)
pH: 7 (ref 5.0–8.0)

## 2017-01-06 NOTE — Progress Notes (Signed)
01/06/2017 10:18 PM   Justin Soto [birth year] [id number]  Referring provider: Jerrol Banana., MD [address] [place], [place] [address]  No chief complaint on file.   HPI: Patient is a 56 year old Caucasian male who presents today requesting an urgent appointment for right-sided flank pain.   The last 3 days he's been having right-sided flank pain.  Not radiate. He states it feels like a sticking pain. He states that he still helps the pain. He states moving around makes the pain worse. He denies any gross hematuria. He has not had any lower urinary tract symptoms such as dysuria, urgency or frequency. He rates the pain an 8 out of 10.   Not had fevers, chills, nausea or vomiting.   Does have a prior history of nephrolithiasis. His stone composition is unknown.  Background history Patient presented for further evaluation of left sided groin pain.   Patient states that 1 week ago he developed the onset of left-sided groin pain. It radiates into the left scrotal area.  He has not had fevers, chills, nausea or vomiting. He is experiencing dysuria, but denies gross hematuria or suprapubic pain.  He has not had any trauma to the area.  He is sexually monogamous. He has not had any penile discharge.  He states that he is urinating after 10-15 times daily. He is getting up 3-4 times nightly. He is sleeping with a CPAP machine. He is currently on tamsulosin 0.4 mg daily. His PVR today is 139 mL.    Left epididymitis Patient states that the epididymitis cleared four days after the antibiotic was started.    BPH WITH LUTS His IPSS score is 17, which is moderate lower urinary tract symptomatology. He is unhappy with his quality life due to his urinary symptoms.  His PVR is 0 mL.  His previous PVR is 139 mL.  His major complaints today are frequency and nocturia x 3.  He has had these symptoms for several years.  He denies any dysuria, hematuria or suprapubic pain.   He  currently taking tamsulosin 0.8 mg daily and finasteride 5 mg daily.   He also denies any recent fevers, chills, nausea or vomiting.   His father had prostate cancer.        IPSS    Row Name 11/14/16 0900         International Prostate Symptom Score   How often have you had the sensation of not emptying your bladder? More than half the time     How often have you had to urinate less than every two hours? Almost always     How often have you found you stopped and started again several times when you urinated? Less than half the time     How often have you found it difficult to postpone urination? Not at All     How often have you had a weak urinary stream? Less than half the time     How often have you had to strain to start urination? Less than 1 in 5 times     How many times did you typically get up at night to urinate? 3 Times     Total IPSS Score 17       Quality of Life due to urinary symptoms   If you were to spend the rest of your life with your urinary condition just the way it is now how would you feel about that? Unhappy  Score:  1-7 Mild 8-19 Moderate 20-35 Severe    PMH: Past Medical History:  Diagnosis Date  . Anemia   . Enlarged prostate    hx of  . Environmental and seasonal allergies   . Frequency of urination   . GERD (gastroesophageal reflux disease)   . History of kidney stones   . HOH (hard of hearing)    wears bilateral hearing aids  . Hypertension   . Irregular heart beat    Pt feels this every once in a while; PCP aware but stated it was OK  . Leg pain, right    Take Lyrica and Naproxen  . Neck pain of over 3 months duration   . Sleep apnea    NO CPAP  . Tick fever     Surgical History: Past Surgical History:  Procedure Laterality Date  . ANTERIOR CERVICAL DECOMP/DISCECTOMY FUSION N/A 11/13/2014   Procedure: Cervical three-four, Cervical six-seven anterior cervical decompression with fusion interbody prosthesis plating and  bonegraft;  Surgeon: Newman Pies, MD;  Location: [facility];  Service: Neurosurgery;  Laterality: N/A;  Cervical three-four, Cervical six-seven anterior cervical decompression with fusion interbody prosthesis plating and bonegraft  . BACK SURGERY     Lumbar X 2  . COLONOSCOPY    . HERNIA REPAIR Left    groin  . LEG SURGERY Right    Had surgery on right calf X 5  . LITHOTRIPSY    . QUADRICEPS TENDON REPAIR Left 06/22/2015   Procedure: REPAIR QUADRICEP TENDON;  Surgeon: Earnestine Leys, MD;  Location: [facility];  Service: Orthopedics;  Laterality: Left;  Marland Kitchen QUADRICEPS TENDON REPAIR Left 08/12/2015   Procedure: REPAIR QUADRICEP TENDON;  Surgeon: Earnestine Leys, MD;  Location: [facility];  Service: Orthopedics;  Laterality: Left;  . ROTATOR CUFF REPAIR Left   . TONSILLECTOMY    . VASECTOMY     with hydrocele repaired    Home Medications:  Allergies as of 01/06/2017      Reactions   Methocarbamol Other (See Comments)   Numbness, and change of taste    Tizanidine Nausea Only      Medication List       Accurate as of 01/06/17 11:59 PM. Always use your most recent med list.          amLODipine-benazepril 10-40 MG capsule Commonly known as:  LOTREL Take 1 capsule by mouth daily.   aspirin EC 81 MG tablet Take 81 mg by mouth daily.   cetirizine 10 MG tablet Commonly known as:  ZYRTEC 1 to 2 tablets daily as needed   diazepam 5 MG tablet Commonly known as:  VALIUM Take 1 tablet (5 mg total) by mouth at bedtime as needed for anxiety.   finasteride 5 MG tablet Commonly known as:  PROSCAR Take 1 tablet (5 mg total) by mouth daily.   fluticasone 50 MCG/ACT nasal spray Commonly known as:  FLONASE Place 2 sprays into both nostrils daily.   hydrochlorothiazide 25 MG tablet Commonly known as:  HYDRODIURIL Take 1 tablet (25 mg total) by mouth daily.   ibuprofen 800 MG tablet Commonly known as:  ADVIL,MOTRIN Take 1 tablet (800 mg total) by mouth 3 (three) times daily as needed.     meloxicam 15 MG tablet Commonly known as:  MOBIC Take 15 mg by mouth daily.   metoprolol tartrate 25 MG tablet Commonly known as:  LOPRESSOR Take 1 tablet (25 mg total) by mouth 2 (two) times daily.   montelukast 10 MG tablet Commonly  known as:  SINGULAIR Take 1 tablet (10 mg total) by mouth every morning.   multivitamin with minerals Tabs tablet Take 1 tablet by mouth daily.   omeprazole 10 MG capsule Commonly known as:  PRILOSEC Take 10 mg by mouth daily.   pregabalin 300 MG capsule Commonly known as:  LYRICA Take 1 capsule (300 mg total) by mouth 2 (two) times daily.   tamsulosin 0.4 MG Caps capsule Commonly known as:  FLOMAX Take 2 capsules (0.8 mg total) by mouth daily.       Allergies:  Allergies  Allergen Reactions  . Methocarbamol Other (See Comments)    Numbness, and change of taste   . Tizanidine Nausea Only    Family History: Family History  Problem Relation Age of Onset  . Prostate cancer Father   . Heart disease Father   . Diabetes Father   . Alzheimer's disease Father   . Anemia Mother   . Osteoporosis Mother   . Diabetes Brother   . Hypertension Brother   . Anemia Daughter   . Kidney disease Neg Hx   . Bladder Cancer Neg Hx     Social History:  reports that he has never smoked. He has never used smokeless tobacco. He reports that he does not drink alcohol or use drugs.  ROS: UROLOGY Frequent Urination?: No Hard to postpone urination?: No Burning/pain with urination?: No Get up at night to urinate?: No Leakage of urine?: No Urine stream starts and stops?: No Trouble starting stream?: No Do you have to strain to urinate?: No Blood in urine?: No Urinary tract infection?: No Sexually transmitted disease?: No Injury to kidneys or bladder?: No Painful intercourse?: No Weak stream?: No Erection problems?: No Penile pain?: No  Gastrointestinal Nausea?: No Vomiting?: No Indigestion/heartburn?: No Diarrhea?: No Constipation?:  No  Constitutional Fever: No Night sweats?: No Weight loss?: No Fatigue?: No  Skin Skin rash/lesions?: No Itching?: No  Eyes Blurred vision?: No Double vision?: No  Ears/Nose/Throat Sore throat?: No Sinus problems?: No  Hematologic/Lymphatic Swollen glands?: No Easy bruising?: No  Cardiovascular Leg swelling?: No Chest pain?: No  Respiratory Cough?: No Shortness of breath?: No  Endocrine Excessive thirst?: No  Musculoskeletal Back pain?: Yes Joint pain?: No  Neurological Headaches?: No Dizziness?: No  Psychologic Depression?: No Anxiety?: No  Physical Exam: BP (!) 150/94   Pulse 96   Ht 5\' 6"  (1.676 m)   Wt 284 lb (128.8 kg)   BMI 45.84 kg/m   Constitutional: Well nourished. Alert and oriented, No acute distress. HEENT: [place] AT, moist mucus membranes. Trachea midline, no masses. Cardiovascular: No clubbing, cyanosis, or edema. Respiratory: Normal respiratory effort, no increased work of breathing. GI: Abdomen is soft, non tender, non distended, no abdominal masses. Liver and spleen not palpable.  No hernias appreciated.  Stool sample for occult testing is not indicated.   GU: Right CVA tenderness.  No bladder fullness or masses.   Rectal: Deferred.   Skin: No rashes, bruises or suspicious lesions. Lymph: No cervical or inguinal adenopathy. Neurologic: Grossly intact, no focal deficits, moving all 4 extremities. Psychiatric: Normal mood and affect.  Laboratory Data: Lab Results  Component Value Date   WBC 5.6 01/05/2017   HGB 10.9 (L) 01/05/2017   HCT 33.5 (L) 01/05/2017   MCV 62.0 (L) 01/05/2017   PLT 156 01/05/2017    Lab Results  Component Value Date   CREATININE 0.97 01/05/2017    Lab Results  Component Value Date   PSA 3.31 10/28/2016  PSA 3.2 05/16/2014       PSA                        3.2                                                                   09/09/2015  Lab Results  Component Value Date   HGBA1C 5.6 11/28/2016     Lab Results  Component Value Date   TSH 1.130 03/30/2016       Component Value Date/Time   CHOL 133 03/30/2016 0946   HDL 23 (L) 03/30/2016 0946   LDLCALC 65 03/30/2016 0946    Lab Results  Component Value Date   AST 34 11/08/2016   Lab Results  Component Value Date   ALT 32 11/08/2016   Urinalysis Unremarkable.  See EPIC.  Pertinent Imaging: CLINICAL DATA:  Acute right-sided flank pain. History of kidney stones. Prior lithotripsy.  EXAM: CT ABDOMEN AND PELVIS WITHOUT CONTRAST  TECHNIQUE: Multidetector CT imaging of the abdomen and pelvis was performed following the standard protocol without IV contrast.  COMPARISON:  CT scan 03/09/2011  FINDINGS: Lower chest: The lung bases are clear of acute process. No pleural effusion or pulmonary lesions. The heart is normal in size. No pericardial effusion. Age advanced three-vessel coronary artery calcifications are noted. The distal esophagus and aorta are unremarkable.  Hepatobiliary: No focal hepatic lesions or intrahepatic biliary dilatation. The gallbladder is normal. No common bile duct dilatation.  Pancreas: No mass, inflammation or ductal dilatation.  Spleen: Normal size.  No focal lesions.  Adrenals/Urinary Tract: The adrenal glands are normal.  Small right-sided renal calculi are noted. No obstructing ureteral calculi or bladder calculi. The left kidney demonstrates a simple appearing cyst. No renal calculi or hydronephrosis. No left-sided ureteral calculi.  Stomach/Bowel: The stomach, duodenum, small bowel and colon are grossly normal without oral contrast. No inflammatory changes, mass lesions or obstructive findings. The terminal ileum and appendix are normal.  Vascular/Lymphatic: Scattered atherosclerotic calcifications involving the aorta but no aneurysm. No branch vessel calcifications. No mesenteric or retroperitoneal mass or adenopathy. Small scattered lymph nodes are  noted.  Reproductive: The prostate gland and seminal vesicles are unremarkable.  Other: Surgical changes from a left inguinal hernia repair. No recurrent hernia. There is a right inguinal hernia containing fat. This appears stable when compared to the prior CT scan. Small bilateral inguinal lymph nodes but no mass or overt adenopathy.  Musculoskeletal: Advanced degenerative changes involving the lower thoracic and lumbar spine with multilevel disc disease and facet disease in the lumbar spine. No acute bony findings or destructive bony changes.  IMPRESSION: 1. Right-sided renal calculi but no obstructing ureteral calculi or bladder calculi. 2. No acute abdominal/pelvic findings, mass lesions or lymphadenopathy. 3. Right inguinal hernia containing fat. Prior left inguinal hernia repair without recurrent hernia. 4. 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   Electronically Signed   By: Marijo Sanes M.D.   On: 01/06/2017 11:36   Assessment & Plan:    1. 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 Left epididymitis  - resolved  3. BPH with LUTS  - Continue conservative management, avoiding bladder irritants and timed voiding's  - continue 5 alpha reductase inhibitor (finasteride 5 mg), discussed side effects  - Continue tamsulosin 0.4 mg daily  - RTC in 3 months for IPSS, PVR, PSA and exam   - BLADDER SCAN AMB NON-IMAGING  4. Right renal stone  - No intervention warranted at this time  - Continue to monitor with yearly [place] to contact our office or seek treatment in the ED if becomes febrile or pain/ vomiting are difficult control in order to arrange for emergent/urgent intervention    Return for keep appointment in March 2018.  These notes generated with voice recognition software. I  apologize for typographical errors.  Zara Council, [facility] [address], [place], [place] [address] [phone]

## 2017-01-06 NOTE — OR Nursing (Signed)
Medical and cardiac clearance received and on the chart.

## 2017-01-06 NOTE — Telephone Encounter (Signed)
Per [name] pt does not have a blocking kidney stone on his ride side, does have a hernia on the ride side, bad back, and urine is clear. Good to go for surgery on Wednesday. Spoke with pt in reference to CT results. Pt voiced understanding.

## 2017-01-07 LAB — URINE CULTURE: Culture: NO GROWTH

## 2017-01-09 ENCOUNTER — Telehealth: Payer: Self-pay | Admitting: Family Medicine

## 2017-01-09 NOTE — Telephone Encounter (Signed)
Patient is having his foot operated on Wed, Jan. 31. However, he has had severe pain in his right lower abd.  He seen Zara Council and she has found that he has a hernia pressing on his kidney.  He is still in a lot of pain and wants to know if he should go through with the surgery on his foot on Wednesday?

## 2017-01-10 ENCOUNTER — Ambulatory Visit (INDEPENDENT_AMBULATORY_CARE_PROVIDER_SITE_OTHER): Payer: BLUE CROSS/BLUE SHIELD | Admitting: Family Medicine

## 2017-01-10 VITALS — BP 132/86 | HR 102 | Temp 98.3°F | Resp 16 | Wt 283.0 lb

## 2017-01-10 DIAGNOSIS — R109 Unspecified abdominal pain: Secondary | ICD-10-CM

## 2017-01-10 DIAGNOSIS — K4090 Unilateral inguinal hernia, without obstruction or gangrene, not specified as recurrent: Secondary | ICD-10-CM

## 2017-01-10 DIAGNOSIS — N200 Calculus of kidney: Secondary | ICD-10-CM | POA: Diagnosis not present

## 2017-01-10 NOTE — Telephone Encounter (Signed)
I can see him today at 2:15 to make sure he does not have an incarcerated hernia.

## 2017-01-10 NOTE — Telephone Encounter (Signed)
Advised  ED 

## 2017-01-10 NOTE — Progress Notes (Signed)
Justin Soto  MRN: [id number] DOB: 1961-11-26  Subjective:  HPI  Patient is here to discuss right side back pain and side pain He has had a dull pain for a while, the more severe pain has been present for 1 week about. He went to see Zara Council Friday 01/06/17 and they did CT scan and it showed no kidney stone but it did show a hernia. Some days the pain has been severe a 10 on scale from 1 to 10 and 10 been the severe pain. Patient Active Problem List   Diagnosis Date Noted  . OSA (obstructive sleep apnea) 11/08/2016  . Hyperglycemia 07/26/2016  . ADD (attention deficit disorder) 09/08/2015  . Absolute anemia 09/08/2015  . Achilles bursitis 09/08/2015  . Back pain, thoracic 09/08/2015  . Benign prostatic hypertrophy with lower urinary tract symptoms (LUTS) 09/08/2015  . Cellulitis 09/08/2015  . Clinical depression 09/08/2015  . Deep vein thrombosis ([place]) 09/08/2015  . Acid reflux 09/08/2015  . Bulge of cervical disc without myelopathy 09/08/2015  . 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 BP (high blood pressure) 09/08/2015  . Cannot sleep 09/08/2015  . Anemia, iron deficiency 09/08/2015  . Bad memory 09/08/2015  . Adiposity 09/08/2015  . Allergic rhinitis, seasonal 09/08/2015  . 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 Fast heart beat 09/08/2015  . Cervical spondylosis with myelopathy and radiculopathy 11/13/2014  . Hernia, inguinal, unilateral 04/06/2009    Past Medical History:  Diagnosis Date  . Anemia   . Enlarged prostate    hx of  . Environmental and seasonal allergies   . Frequency of urination   . GERD (gastroesophageal reflux disease)   . History of kidney stones   . HOH (hard of hearing)    wears bilateral hearing aids  . Hypertension   . Irregular heart beat    Pt feels this every once in a while; PCP aware but stated it was OK  . Leg pain, right    Take Lyrica and Naproxen  . Neck pain of over 3  months duration   . Sleep apnea    NO CPAP  . Tick fever     Social History   Social History  . Marital status: Married    Spouse name: N/A  . Number of children: N/A  . Years of education: N/A   Occupational History  . Not on file.   Social History Main Topics  . Smoking status: Never Smoker  . Smokeless tobacco: Never Used  . Alcohol use No  . Drug use: No  . Sexual activity: Not on file   Other Topics Concern  . Not on file   Social History Narrative  . No narrative on file    Outpatient Encounter Prescriptions as of 01/10/2017  Medication Sig  . amLODipine-benazepril (LOTREL) 10-40 MG capsule Take 1 capsule by mouth daily.  Marland Kitchen aspirin EC 81 MG tablet Take 81 mg by mouth daily.  . cetirizine (ZYRTEC) 10 MG tablet 1 to 2 tablets daily as needed (Patient taking differently: Take 10-20 mg by mouth daily as needed for allergies. 1 to 2 tablets daily as needed)  . diazepam (VALIUM) 5 MG tablet Take 1 tablet (5 mg total) by mouth at bedtime as needed for anxiety.  . finasteride (PROSCAR) 5 MG tablet Take 1 tablet (5 mg total) by mouth daily.  . fluticasone (FLONASE) 50 MCG/ACT nasal spray Place 2 sprays into both nostrils daily.  Marland Kitchen  hydrochlorothiazide (HYDRODIURIL) 25 MG tablet Take 1 tablet (25 mg total) by mouth daily.  Marland Kitchen ibuprofen (ADVIL,MOTRIN) 800 MG tablet Take 1 tablet (800 mg total) by mouth 3 (three) times daily as needed. (Patient taking differently: Take 800 mg by mouth daily as needed (for pain.). )  . meloxicam (MOBIC) 15 MG tablet Take 15 mg by mouth daily.  . metoprolol tartrate (LOPRESSOR) 25 MG tablet Take 1 tablet (25 mg total) by mouth 2 (two) times daily.  . montelukast (SINGULAIR) 10 MG tablet Take 1 tablet (10 mg total) by mouth every morning.  . Multiple Vitamin (MULTIVITAMIN WITH MINERALS) TABS tablet Take 1 tablet by mouth daily.  Marland Kitchen omeprazole (PRILOSEC) 10 MG capsule Take 10 mg by mouth daily.  . pregabalin (LYRICA) 300 MG capsule Take 1 capsule (300  mg total) by mouth 2 (two) times daily.  . tamsulosin (FLOMAX) 0.4 MG CAPS capsule Take 2 capsules (0.8 mg total) by mouth daily.   No facility-administered encounter medications on file as of 01/10/2017.     Allergies  Allergen Reactions  . Methocarbamol Other (See Comments)    Numbness, and change of taste   . Tizanidine Nausea Only    Review of Systems  Constitutional: Negative.   Respiratory: Negative.   Cardiovascular: Negative.   Gastrointestinal: Positive for nausea (off and on). Negative for blood in stool, constipation and diarrhea.  Musculoskeletal: Positive for back pain, joint pain and myalgias.  Skin: Negative.   Neurological: Negative for dizziness.  Endo/Heme/Allergies: Negative.   Psychiatric/Behavioral: Negative.     Objective:  BP 132/86   Pulse (!) 102   Temp 98.3 F (36.8 C)   Resp 16   Wt 283 lb (128.4 kg)   BMI 45.68 kg/m   Physical Exam  Constitutional: He is oriented to person, place, and time and well-developed, well-nourished, and in no distress.  HENT:  Head: Normocephalic and atraumatic.  Eyes: Conjunctivae are normal. No scleral icterus.  Neck: No thyromegaly present.  Cardiovascular: Normal rate, regular rhythm and normal heart sounds.   Pulmonary/Chest: Effort normal and breath sounds normal.  Abdominal: Soft.  Genitourinary:  Genitourinary Comments: Very small right inguinal hernia. Easily reducible.  Neurological: He is alert and oriented to person, place, and time.  Skin: Skin is warm and dry.  Psychiatric: Mood, memory, affect and judgment normal.    Assessment and Plan :  Musculoskeletal back/flank pain Right inguinal hernia Patient cleared for foot surgery Right renal stone Presently asymptomatic.  I have done the exam and reviewed the chart and it is accurate to the best of my knowledge. Development worker, community has been used and  any errors in dictation or transcription are unintentional. Miguel Aschoff M.D. [facility]

## 2017-01-11 ENCOUNTER — Ambulatory Visit: Payer: BLUE CROSS/BLUE SHIELD | Admitting: Anesthesiology

## 2017-01-11 ENCOUNTER — Encounter
Admission: RE | Disposition: A | Discharge status: Home / Self Care | Payer: Self-pay | Source: Ambulatory Visit | Attending: Specialist

## 2017-01-11 ENCOUNTER — Encounter: Payer: Self-pay | Admitting: *Deleted

## 2017-01-11 ENCOUNTER — Ambulatory Visit: Payer: BLUE CROSS/BLUE SHIELD | Admitting: Family Medicine

## 2017-01-11 ENCOUNTER — Ambulatory Visit
Admission: RE | Admit: 2017-01-11 | Discharge: 2017-01-11 | Disposition: A | Discharge status: Home / Self Care | Payer: BLUE CROSS/BLUE SHIELD | Source: Ambulatory Visit | Attending: Specialist | Admitting: Specialist

## 2017-01-11 DIAGNOSIS — Z6841 Body Mass Index (BMI) 40.0 and over, adult: Secondary | ICD-10-CM | POA: Diagnosis not present

## 2017-01-11 DIAGNOSIS — G8918 Other acute postprocedural pain: Secondary | ICD-10-CM | POA: Diagnosis not present

## 2017-01-11 DIAGNOSIS — G4730 Sleep apnea, unspecified: Secondary | ICD-10-CM | POA: Insufficient documentation

## 2017-01-11 DIAGNOSIS — G8929 Other chronic pain: Secondary | ICD-10-CM | POA: Diagnosis not present

## 2017-01-11 DIAGNOSIS — Z981 Arthrodesis status: Secondary | ICD-10-CM | POA: Diagnosis not present

## 2017-01-11 DIAGNOSIS — I10 Essential (primary) hypertension: Secondary | ICD-10-CM | POA: Diagnosis not present

## 2017-01-11 DIAGNOSIS — Z9889 Other specified postprocedural states: Secondary | ICD-10-CM | POA: Insufficient documentation

## 2017-01-11 DIAGNOSIS — M79604 Pain in right leg: Secondary | ICD-10-CM | POA: Diagnosis not present

## 2017-01-11 DIAGNOSIS — N400 Enlarged prostate without lower urinary tract symptoms: Secondary | ICD-10-CM | POA: Insufficient documentation

## 2017-01-11 DIAGNOSIS — Z7951 Long term (current) use of inhaled steroids: Secondary | ICD-10-CM | POA: Diagnosis not present

## 2017-01-11 DIAGNOSIS — M21372 Foot drop, left foot: Secondary | ICD-10-CM | POA: Diagnosis not present

## 2017-01-11 DIAGNOSIS — Z87442 Personal history of urinary calculi: Secondary | ICD-10-CM | POA: Insufficient documentation

## 2017-01-11 DIAGNOSIS — Z79899 Other long term (current) drug therapy: Secondary | ICD-10-CM | POA: Diagnosis not present

## 2017-01-11 DIAGNOSIS — R200 Anesthesia of skin: Secondary | ICD-10-CM | POA: Insufficient documentation

## 2017-01-11 DIAGNOSIS — K219 Gastro-esophageal reflux disease without esophagitis: Secondary | ICD-10-CM | POA: Insufficient documentation

## 2017-01-11 DIAGNOSIS — Z9852 Vasectomy status: Secondary | ICD-10-CM | POA: Diagnosis not present

## 2017-01-11 DIAGNOSIS — Z791 Long term (current) use of non-steroidal anti-inflammatories (NSAID): Secondary | ICD-10-CM | POA: Insufficient documentation

## 2017-01-11 DIAGNOSIS — M7661 Achilles tendinitis, right leg: Secondary | ICD-10-CM | POA: Insufficient documentation

## 2017-01-11 DIAGNOSIS — M79671 Pain in right foot: Secondary | ICD-10-CM | POA: Diagnosis not present

## 2017-01-11 DIAGNOSIS — M216X1 Other acquired deformities of right foot: Secondary | ICD-10-CM | POA: Insufficient documentation

## 2017-01-11 DIAGNOSIS — Z7982 Long term (current) use of aspirin: Secondary | ICD-10-CM | POA: Diagnosis not present

## 2017-01-11 DIAGNOSIS — M25571 Pain in right ankle and joints of right foot: Secondary | ICD-10-CM | POA: Diagnosis not present

## 2017-01-11 HISTORY — PX: CALCANEAL OSTEOTOMY: SHX1281

## 2017-01-11 SURGERY — OSTEOTOMY, CALCANEUS
Anesthesia: General | Laterality: Right | Wound class: Clean

## 2017-01-11 MED ORDER — GABAPENTIN 400 MG PO CAPS
400.0000 mg | ORAL_CAPSULE | Freq: Once | ORAL | Status: AC
  Administered 2017-01-11: 400 mg via ORAL

## 2017-01-11 MED ORDER — LIDOCAINE HCL (PF) 1 % IJ SOLN
INTRAMUSCULAR | Status: DC
Start: 2017-01-11 — End: 2017-01-11
  Filled 2017-01-11: qty 5

## 2017-01-11 MED ORDER — EPHEDRINE 5 MG/ML INJ
INTRAVENOUS | Status: AC
  Filled 2017-01-11: qty 10

## 2017-01-11 MED ORDER — ONDANSETRON HCL 4 MG/2ML IJ SOLN
INTRAMUSCULAR | Status: AC
  Filled 2017-01-11: qty 2

## 2017-01-11 MED ORDER — MELOXICAM 7.5 MG PO TABS
15.0000 mg | ORAL_TABLET | Freq: Once | ORAL | Status: AC
  Administered 2017-01-11: 15 mg via ORAL

## 2017-01-11 MED ORDER — MIDAZOLAM HCL 2 MG/2ML IJ SOLN
INTRAMUSCULAR | Status: AC
  Filled 2017-01-11: qty 2

## 2017-01-11 MED ORDER — FENTANYL CITRATE (PF) 100 MCG/2ML IJ SOLN
INTRAMUSCULAR | Status: AC
  Filled 2017-01-11: qty 2

## 2017-01-11 MED ORDER — SUGAMMADEX SODIUM 200 MG/2ML IV SOLN
INTRAVENOUS | Status: DC | PRN
  Administered 2017-01-11 (×2): 200 mg via INTRAVENOUS

## 2017-01-11 MED ORDER — MELOXICAM 7.5 MG PO TABS
ORAL_TABLET | ORAL | Status: AC
  Filled 2017-01-11: qty 2

## 2017-01-11 MED ORDER — BUPIVACAINE HCL (PF) 0.5 % IJ SOLN
INTRAMUSCULAR | Status: AC
  Filled 2017-01-11: qty 30

## 2017-01-11 MED ORDER — OXYCODONE HCL 5 MG PO TABS
ORAL_TABLET | ORAL | Status: AC
  Filled 2017-01-11: qty 1

## 2017-01-11 MED ORDER — SUGAMMADEX SODIUM 200 MG/2ML IV SOLN
INTRAVENOUS | Status: AC
  Filled 2017-01-11: qty 2

## 2017-01-11 MED ORDER — CLINDAMYCIN PHOSPHATE 600 MG/50ML IV SOLN
INTRAVENOUS | Status: AC
  Filled 2017-01-11: qty 50

## 2017-01-11 MED ORDER — ONDANSETRON HCL 4 MG/2ML IJ SOLN
INTRAMUSCULAR | Status: DC | PRN
  Administered 2017-01-11: 4 mg via INTRAVENOUS

## 2017-01-11 MED ORDER — ONDANSETRON HCL 4 MG/2ML IJ SOLN: 4.0000 mg | Freq: Once | INTRAMUSCULAR | Status: DC | PRN

## 2017-01-11 MED ORDER — CHLORHEXIDINE GLUCONATE CLOTH 2 % EX PADS: 6.0000 | MEDICATED_PAD | Freq: Once | CUTANEOUS | Status: DC

## 2017-01-11 MED ORDER — OXYCODONE HCL 5 MG PO TABS
5.0000 mg | ORAL_TABLET | ORAL | Status: DC | PRN
  Administered 2017-01-11: 5 mg via ORAL
  Filled 2017-01-11: qty 1

## 2017-01-11 MED ORDER — CEFAZOLIN SODIUM-DEXTROSE 2-4 GM/100ML-% IV SOLN
2.0000 g | INTRAVENOUS | Status: AC
  Administered 2017-01-11: 2 g via INTRAVENOUS

## 2017-01-11 MED ORDER — DEXAMETHASONE SODIUM PHOSPHATE 10 MG/ML IJ SOLN
INTRAMUSCULAR | Status: AC
  Filled 2017-01-11: qty 1

## 2017-01-11 MED ORDER — FENTANYL CITRATE (PF) 100 MCG/2ML IJ SOLN
INTRAMUSCULAR | Status: DC | PRN
  Administered 2017-01-11: 50 ug via INTRAVENOUS

## 2017-01-11 MED ORDER — OXYCODONE HCL 5 MG PO TABS: 5.0000 mg | ORAL_TABLET | Freq: Four times a day (QID) | ORAL | 0 refills | Status: DC | PRN

## 2017-01-11 MED ORDER — CEFAZOLIN SODIUM-DEXTROSE 2-4 GM/100ML-% IV SOLN
INTRAVENOUS | Status: AC
  Filled 2017-01-11: qty 100

## 2017-01-11 MED ORDER — ROCURONIUM BROMIDE 50 MG/5ML IV SOSY
PREFILLED_SYRINGE | INTRAVENOUS | Status: AC
  Filled 2017-01-11: qty 5

## 2017-01-11 MED ORDER — LIDOCAINE HCL (CARDIAC) 20 MG/ML IV SOLN
INTRAVENOUS | Status: DC | PRN
  Administered 2017-01-11: 15 mg via INTRAVENOUS

## 2017-01-11 MED ORDER — ROCURONIUM BROMIDE 100 MG/10ML IV SOLN
INTRAVENOUS | Status: DC | PRN
  Administered 2017-01-11: 20 mg via INTRAVENOUS
  Administered 2017-01-11: 30 mg via INTRAVENOUS

## 2017-01-11 MED ORDER — ROPIVACAINE HCL 5 MG/ML IJ SOLN
INTRAMUSCULAR | Status: DC | PRN
  Administered 2017-01-11: 30 mL

## 2017-01-11 MED ORDER — GABAPENTIN 400 MG PO CAPS
ORAL_CAPSULE | ORAL | Status: AC
  Filled 2017-01-11: qty 1

## 2017-01-11 MED ORDER — LACTATED RINGERS IV SOLN
INTRAVENOUS | Status: DC
  Administered 2017-01-11: 09:00:00 via INTRAVENOUS

## 2017-01-11 MED ORDER — MIDAZOLAM HCL 2 MG/2ML IJ SOLN
1.0000 mg | Freq: Once | INTRAMUSCULAR | Status: AC
  Administered 2017-01-11: 1 mg via INTRAVENOUS

## 2017-01-11 MED ORDER — ROPIVACAINE HCL 5 MG/ML IJ SOLN
INTRAMUSCULAR | Status: AC
  Filled 2017-01-11: qty 40

## 2017-01-11 MED ORDER — NEOMYCIN-POLYMYXIN B GU 40-200000 IR SOLN
Status: AC
  Filled 2017-01-11: qty 4

## 2017-01-11 MED ORDER — FENTANYL CITRATE (PF) 100 MCG/2ML IJ SOLN
25.0000 ug | INTRAMUSCULAR | Status: DC | PRN
  Administered 2017-01-11 (×2): 25 ug via INTRAVENOUS

## 2017-01-11 MED ORDER — PROPOFOL 10 MG/ML IV BOLUS
INTRAVENOUS | Status: AC
  Filled 2017-01-11: qty 20

## 2017-01-11 MED ORDER — MIDAZOLAM HCL 2 MG/2ML IJ SOLN
INTRAMUSCULAR | Status: DC | PRN
  Administered 2017-01-11: 2 mg via INTRAVENOUS

## 2017-01-11 MED ORDER — MIDAZOLAM HCL 2 MG/2ML IJ SOLN
INTRAMUSCULAR | Status: AC
  Administered 2017-01-11: 1 mg
  Filled 2017-01-11: qty 2

## 2017-01-11 MED ORDER — LIDOCAINE HCL (PF) 2 % IJ SOLN
INTRAMUSCULAR | Status: AC
  Filled 2017-01-11: qty 2

## 2017-01-11 MED ORDER — SUCCINYLCHOLINE CHLORIDE 20 MG/ML IJ SOLN
INTRAMUSCULAR | Status: DC | PRN
  Administered 2017-01-11: 100 mg via INTRAVENOUS

## 2017-01-11 MED ORDER — SUCCINYLCHOLINE CHLORIDE 200 MG/10ML IV SOSY
PREFILLED_SYRINGE | INTRAVENOUS | Status: AC
Start: 2017-01-11 — End: 2017-01-11
  Filled 2017-01-11: qty 10

## 2017-01-11 MED ORDER — PROPOFOL 10 MG/ML IV BOLUS
INTRAVENOUS | Status: DC | PRN
  Administered 2017-01-11: 150 mg via INTRAVENOUS

## 2017-01-11 MED ORDER — DEXAMETHASONE SODIUM PHOSPHATE 10 MG/ML IJ SOLN
INTRAMUSCULAR | Status: DC | PRN
  Administered 2017-01-11: 5 mg via INTRAVENOUS

## 2017-01-11 MED ORDER — CLINDAMYCIN PHOSPHATE 600 MG/50ML IV SOLN
600.0000 mg | Freq: Once | INTRAVENOUS | Status: AC
  Administered 2017-01-11: 600 mg via INTRAVENOUS

## 2017-01-11 MED ORDER — NEOMYCIN-POLYMYXIN B GU 40-200000 IR SOLN
Status: DC | PRN
  Administered 2017-01-11: 4 mL

## 2017-01-11 MED ORDER — PHENYLEPHRINE HCL 10 MG/ML IJ SOLN
INTRAMUSCULAR | Status: DC | PRN
  Administered 2017-01-11: 200 ug via INTRAVENOUS
  Administered 2017-01-11: 100 ug via INTRAVENOUS
  Administered 2017-01-11: 200 ug via INTRAVENOUS

## 2017-01-11 MED ORDER — EPHEDRINE SULFATE 50 MG/ML IJ SOLN
INTRAMUSCULAR | Status: DC | PRN
  Administered 2017-01-11 (×4): 10 mg via INTRAVENOUS

## 2017-01-11 MED ORDER — BUPIVACAINE HCL (PF) 0.5 % IJ SOLN
INTRAMUSCULAR | Status: DC | PRN
  Administered 2017-01-11: 20 mL

## 2017-01-11 SURGICAL SUPPLY — 51 items
BLADE OSCILLATING/SAGITTAL (BLADE) ×2
BLADE SURG MINI STRL (BLADE) ×2 IMPLANT
BLADE SW THK.38XMED LNG THN (BLADE) ×1 IMPLANT
BNDG COHESIVE 4X5 TAN STRL (GAUZE/BANDAGES/DRESSINGS) ×2 IMPLANT
BNDG ESMARK 6X12 TAN STRL LF (GAUZE/BANDAGES/DRESSINGS) ×2 IMPLANT
BUR SURG 4X8 MED (BURR) ×1 IMPLANT
BURR SURG 4X8 MED (BURR) ×2
CANISTER SUCT 1200ML W/VALVE (MISCELLANEOUS) ×2 IMPLANT
CHLORAPREP W/TINT 26ML (MISCELLANEOUS) ×3 IMPLANT
CUFF TOURN 18 STER (MISCELLANEOUS) ×1 IMPLANT
CUFF TOURN 24 STER (MISCELLANEOUS) IMPLANT
CUFF TOURN 30 N/S (MISCELLANEOUS) ×1 IMPLANT
CUFF TOURN SGL QUICK 24 (TOURNIQUET CUFF)
CUFF TRNQT CYL 24X4X40X1 (TOURNIQUET CUFF) ×1 IMPLANT
DRAIN PENROSE 1/4X12 LTX (DRAIN) ×1 IMPLANT
DRAPE FLUOR MINI C-ARM 54X84 (DRAPES) ×2 IMPLANT
ELECT REM PT RETURN 9FT ADLT (ELECTROSURGICAL) ×2
ELECTRODE REM PT RTRN 9FT ADLT (ELECTROSURGICAL) ×1 IMPLANT
GAUZE PETRO XEROFOAM 1X8 (MISCELLANEOUS) ×2 IMPLANT
GAUZE SPONGE 4X4 12PLY STRL (GAUZE/BANDAGES/DRESSINGS) ×2 IMPLANT
GLOVE BIO SURGEON STRL SZ8 (GLOVE) ×2 IMPLANT
GLOVE INDICATOR 8.0 STRL GRN (GLOVE) ×2 IMPLANT
GLOVE SURG ORTHO 8.5 STRL (GLOVE) ×2 IMPLANT
GOWN STRL REUS W/ TWL LRG LVL3 (GOWN DISPOSABLE) ×1 IMPLANT
GOWN STRL REUS W/TWL LRG LVL3 (GOWN DISPOSABLE) ×2
GOWN STRL REUS W/TWL LRG LVL4 (GOWN DISPOSABLE) ×2 IMPLANT
HANDLE YANKAUER SUCT BULB TIP (MISCELLANEOUS) ×2 IMPLANT
KIT RM TURNOVER STRD PROC AR (KITS) ×2 IMPLANT
LOOP RED MAXI  1X406MM (MISCELLANEOUS)
LOOP VESSEL MAXI 1X406 RED (MISCELLANEOUS) ×1 IMPLANT
LOOP VESSEL SUPERMAXI WHITE (MISCELLANEOUS) ×1 IMPLANT
NDL FILTER BLUNT 18X1 1/2 (NEEDLE) ×1 IMPLANT
NDL SAFETY 18GX1.5 (NEEDLE) ×2 IMPLANT
NEEDLE FILTER BLUNT 18X 1/2SAF (NEEDLE) ×1
NEEDLE FILTER BLUNT 18X1 1/2 (NEEDLE) ×1 IMPLANT
NS IRRIG 1000ML POUR BTL (IV SOLUTION) ×2 IMPLANT
PACK EXTREMITY ARMC (MISCELLANEOUS) ×2 IMPLANT
PADDING CAST 4IN STRL (MISCELLANEOUS) ×2
PADDING CAST BLEND 4X4 STRL (MISCELLANEOUS) ×2 IMPLANT
SPLINT CAST 1 STEP 4X30 (MISCELLANEOUS) IMPLANT
SPLINT CAST 1 STEP 5X30 WHT (MISCELLANEOUS) ×2 IMPLANT
SPONGE LAP 18X18 5 PK (GAUZE/BANDAGES/DRESSINGS) ×2 IMPLANT
STAPLER SKIN PROX 35W (STAPLE) ×2 IMPLANT
STOCKINETTE BIAS CUT 6 980064 (GAUZE/BANDAGES/DRESSINGS) ×2 IMPLANT
STOCKINETTE IMPERVIOUS 9X36 MD (GAUZE/BANDAGES/DRESSINGS) ×2 IMPLANT
SUT BONE WAX W31G (SUTURE) ×2 IMPLANT
SUT VIC AB 2-0 CT2 27 (SUTURE) ×2 IMPLANT
SUT VIC AB 3-0 SH 27 (SUTURE) ×2
SUT VIC AB 3-0 SH 27X BRD (SUTURE) ×1 IMPLANT
SYR 30ML LL (SYRINGE) ×2 IMPLANT
SYRINGE 10CC LL (SYRINGE) ×2 IMPLANT

## 2017-01-11 NOTE — Anesthesia Procedure Notes (Signed)
Procedure Name: Intubation Date/Time: 01/11/2017 10:08 AM Performed by: Zetta Bills Pre-anesthesia Checklist: Patient identified, Emergency Drugs available, Suction available and Patient being monitored Patient Re-evaluated:Patient Re-evaluated prior to inductionOxygen Delivery Method: Circle system utilized Preoxygenation: Pre-oxygenation with 100% oxygen Intubation Type: IV induction Ventilation: Mask ventilation without difficulty Laryngoscope Size: Mac and 4 Grade View: Grade II Tube type: Oral Tube size: 7.0 mm Number of attempts: 1 Airway Equipment and Method: Patient positioned with wedge pillow and Stylet Placement Confirmation: ETT inserted through vocal cords under direct vision,  positive ETCO2 and breath sounds checked- equal and bilateral Secured at: 22 cm Tube secured with: Tape Dental Injury: Teeth and Oropharynx as per pre-operative assessment

## 2017-01-11 NOTE — Op Note (Signed)
01/11/2017  11:23 AM  PATIENT:  Justin Soto    PRE-OPERATIVE DIAGNOSIS:  M76.60 Achilles tendinitis, right leg  POST-OPERATIVE DIAGNOSIS:  Same  PROCEDURE:  CALCANEAL OSTEOTOMY  SURGEON:  Park Breed, MD  ANESTHESIA:   General  PREOPERATIVE INDICATIONS:  Justin Soto is a  56 y.o. male with a diagnosis of M76.61 Achilles tendinitis, right leg who failed conservative measures and elected for surgical management.    The risks benefits and alternatives were discussed with the patient preoperatively including but not limited to the risks of infection, bleeding, nerve injury, cardiopulmonary complications, the need for revision surgery, among others, and the patient was willing to proceed.  EBL: None  TOURNIQUET TIME: 32  MIN  OPERATIVE IMPLANTS: None  OPERATIVE FINDINGS: Large Haglund's deformity, right calcaneus  OPERATIVE PROCEDURE: The patient was brought to the operating room and underwent satisfactory general endotracheal anesthesia. The patient was then rolled into the left lateral decubitus position on the operating room table and padded appropriately.  The operative leg was prepped and draped sterile fashion.  Esmarch was applied and tourniquet inflated to 300 mmHg.  A posterior lateral incision was made parallel to the distal portion of the Achilles tendon.  Dissection was carried out bluntly through subcutaneous tissue.  The sural nerve was protected.  The retrocalcaneal  bursa and adjacent tissues were debrided to expose the dorsal aspect of the calcaneus posteriorly.  The interval between the Achilles tendon and the large Haglund deformity was exposed and retracted with a Crego retractor.  An oscillating saw was then used to remove the large bony prominence.   A motorized rasp was used to smooth this off.  Fluoroscopy showed good removal of the offending bone.  The traction spur at the distal Achilles insertion point was left alone.  After thorough irrigation, bone wax was  applied to the raw bony surface.  The soft tissues were closed with 2-0 and 3-0 Vicryls and the skin was closed with staples.  Half percent Marcaine was placed in the soft tissues.  Dry sterile dressing with a posterior splint was applied with the ankle in some equinus.  Sponge and needle counts were correct.  The patient was then rolled into the supine position on a stretcher and awakened.  Return to the recovery room in good condition.  Park Breed, MD

## 2017-01-11 NOTE — Anesthesia Post-op Follow-up Note (Cosign Needed)
Anesthesia QCDR form completed.        

## 2017-01-11 NOTE — H&P (Signed)
THE PATIENT WAS SEEN PRIOR TO SURGERY TODAY.  HISTORY, ALLERGIES, HOME MEDICATIONS AND OPERATIVE PROCEDURE WERE REVIEWED. RISKS AND BENEFITS OF SURGERY DISCUSSED WITH PATIENT AGAIN.  NO CHANGES FROM INITIAL HISTORY AND PHYSICAL NOTED.    

## 2017-01-11 NOTE — Anesthesia Preprocedure Evaluation (Signed)
Anesthesia Evaluation  Patient identified by MRN, date of birth, ID band Patient awake    Reviewed: Allergy & Precautions, H&P , NPO status , Patient's Chart, lab work & pertinent test results, reviewed documented beta blocker date and time   Airway Mallampati: III  TM Distance: <3 FB Neck ROM: full    Dental  (+) Teeth Intact   Pulmonary neg pulmonary ROS, sleep apnea ,    Pulmonary exam normal        Cardiovascular Exercise Tolerance: Poor hypertension, negative cardio ROS Normal cardiovascular exam Rhythm:regular Rate:Normal  Neg ex. 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  Hx of trauma to the right calf.  Chronic pain at the base of the right foot and numbness over the dorsum of the right foot.  ja negative neurological ROS  negative psych ROS   GI/Hepatic negative GI ROS, Neg liver ROS, GERD  Medicated,  Endo/Other  negative endocrine ROSMorbid obesity  Renal/GU negative Renal ROS  negative genitourinary   Musculoskeletal   Abdominal   Peds  Hematology negative hematology ROS (+) anemia ,   Anesthesia Other Findings Past Medical History: No date: Anemia No date: Enlarged prostate     Comment: hx of No date: Environmental and seasonal allergies No date: Frequency of urination No date: GERD (gastroesophageal reflux disease) No date: History of kidney stones No date: HOH (hard of hearing)     Comment: wears bilateral hearing aids No date: Hypertension No date: Irregular heart beat     Comment: Pt feels this every once in a while; PCP aware              but stated it was OK No date: Leg pain, right     Comment: Take Lyrica and Naproxen No date: Neck pain of over 3 months duration No date: Sleep apnea     Comment: NO CPAP No date: Tick fever Past Surgical History: 11/13/2014: ANTERIOR CERVICAL DECOMP/DISCECTOMY FUSION N/A  Comment: Procedure: Cervical three-four, Cervical               six-seven anterior cervical decompression with               fusion interbody prosthesis plating and               bonegraft;  Surgeon: Newman Pies, MD;                Location: [facility] OR;  Service: Neurosurgery;                Laterality: N/A;  Cervical three-four, Cervical              six-seven anterior cervical decompression with               fusion interbody prosthesis plating and               bonegraft No date: BACK SURGERY     Comment: Lumbar X 2 No date: COLONOSCOPY No date: HERNIA REPAIR Left     Comment: groin No date: LEG SURGERY Right     Comment: Had surgery on right calf X 5 No date: LITHOTRIPSY 06/22/2015: QUADRICEPS TENDON REPAIR Left     Comment: Procedure: REPAIR QUADRICEP TENDON;  Surgeon:               Earnestine Leys, MD;  Location: [facility] ORS;  Service: Orthopedics;  Laterality: Left; 08/12/2015: QUADRICEPS TENDON REPAIR Left     Comment: Procedure: REPAIR QUADRICEP TENDON;  Surgeon:               Earnestine Leys, MD;  Location: [facility] ORS;                Service: Orthopedics;  Laterality: Left; No date: ROTATOR CUFF REPAIR Left No date: TONSILLECTOMY No date: VASECTOMY     Comment: with hydrocele repaired   Reproductive/Obstetrics negative OB ROS                             Anesthesia Physical Anesthesia Plan  ASA: III  Anesthesia Plan: General ETT   Post-op Pain Management:  Regional for Post-op pain   Induction:   Airway Management Planned: Video Laryngoscope Planned  Additional Equipment:   Intra-op Plan:   Post-operative Plan:   Informed Consent: I have reviewed the patients History and Physical, chart, labs and discussed the procedure including the risks, benefits and alternatives for the proposed anesthesia with the patient or authorized representative who has indicated his/her understanding and acceptance.   Dental Advisory Given  Plan  Discussed with: CRNA  Anesthesia Plan Comments: (Pt requests a post op pain block for his right foot pain.  Issues regarding preop weakness reviewed and risks and benefits reviewed.  JA)        Anesthesia Quick Evaluation

## 2017-01-11 NOTE — Discharge Instructions (Signed)

## 2017-01-11 NOTE — Anesthesia Procedure Notes (Signed)
Anesthesia Regional Block:  Popliteal block  Pre-Anesthetic Checklist: ,, timeout performed, Correct Patient, Correct Site, Correct Laterality, Correct Procedure, Correct Position, site marked, Risks and benefits discussed,  Surgical consent,  Pre-op evaluation,  At surgeon's request and post-op pain management  Laterality: Right  Prep: chloraprep       Needles:  Injection technique: Single-shot  Needle Type: Echogenic Needle     Needle Length: 9cm 9 cm Needle Gauge: 21 and 21 G    Additional Needles:  Procedures: ultrasound guided (picture in chart) Popliteal block Narrative:  Injection made incrementally with aspirations every 5 mL.  Performed by: Personally  Anesthesiologist: Katy Fitch K  Additional Notes: Functioning IV was confirmed and monitors were applied.  A echogenic needle was used. Sterile prep,hand hygiene and sterile gloves were used.  Negative aspiration and negative test dose prior to incremental administration of local anesthetic. The patient tolerated the procedure well with no immediate complications. Vashti Hey, MD

## 2017-01-11 NOTE — Transfer of Care (Signed)
Immediate Anesthesia Transfer of Care Note  Patient: Justin Soto  Procedure(s) Performed: Procedure(s): CALCANEAL OSTEOTOMY (Right)  Patient Location: PACU  Anesthesia Type:General  Level of Consciousness: awake  Airway & Oxygen Therapy: Patient Spontanous Breathing  Post-op Assessment: Report given to RN  Post vital signs: stable  Last Vitals:  Vitals:   01/11/17 0951 01/11/17 1135  BP: 136/86   Pulse: 80   Resp: (!) 26   Temp:  (P) 36.4 C    Last Pain:  Vitals:   01/11/17 1135  TempSrc:   PainSc: (P) Asleep         Complications: No apparent anesthesia complications

## 2017-01-11 NOTE — Progress Notes (Signed)
To PACU for block 

## 2017-01-11 NOTE — Progress Notes (Signed)
Pt to OR.

## 2017-01-12 NOTE — Anesthesia Postprocedure Evaluation (Signed)
Anesthesia Post Note  Patient: Justin Soto  Procedure(s) Performed: Procedure(s) (LRB): CALCANEAL OSTEOTOMY (Right)  Patient location during evaluation: PACU Anesthesia Type: General Level of consciousness: awake and alert Pain management: pain level controlled Vital Signs Assessment: post-procedure vital signs reviewed and stable Respiratory status: spontaneous breathing, nonlabored ventilation, respiratory function stable and patient connected to nasal cannula oxygen Cardiovascular status: blood pressure returned to baseline and stable Postop Assessment: no signs of nausea or vomiting Anesthetic complications: no     Last Vitals:  Vitals:   01/11/17 1301 01/11/17 1335  BP: (!) 155/71 128/68  Pulse: 92 87  Resp: 18 18  Temp: 36.5 C     Last Pain:  Vitals:   01/11/17 1335  TempSrc:   PainSc: 1                  Molli Barrows

## 2017-01-13 DIAGNOSIS — M7661 Achilles tendinitis, right leg: Secondary | ICD-10-CM | POA: Diagnosis not present

## 2017-01-15 ENCOUNTER — Encounter: Payer: Self-pay | Admitting: Family Medicine

## 2017-01-15 DIAGNOSIS — N200 Calculus of kidney: Secondary | ICD-10-CM

## 2017-01-15 HISTORY — DX: Calculus of kidney: N20.0

## 2017-01-23 DIAGNOSIS — G4733 Obstructive sleep apnea (adult) (pediatric): Secondary | ICD-10-CM | POA: Diagnosis not present

## 2017-02-06 ENCOUNTER — Ambulatory Visit: Payer: BLUE CROSS/BLUE SHIELD | Admitting: Dietician

## 2017-02-08 ENCOUNTER — Telehealth: Payer: Self-pay | Admitting: Dietician

## 2017-02-08 NOTE — Telephone Encounter (Signed)
Called patient to reschedule appointment which he missed on 02/06/17. He states he is still recovering from foot surgery and will call when he is able to return.

## 2017-02-10 DIAGNOSIS — M25571 Pain in right ankle and joints of right foot: Secondary | ICD-10-CM | POA: Diagnosis not present

## 2017-02-10 DIAGNOSIS — M25671 Stiffness of right ankle, not elsewhere classified: Secondary | ICD-10-CM | POA: Diagnosis not present

## 2017-02-13 DIAGNOSIS — M25571 Pain in right ankle and joints of right foot: Secondary | ICD-10-CM | POA: Diagnosis not present

## 2017-02-14 DIAGNOSIS — M25671 Stiffness of right ankle, not elsewhere classified: Secondary | ICD-10-CM | POA: Diagnosis not present

## 2017-02-14 DIAGNOSIS — M25571 Pain in right ankle and joints of right foot: Secondary | ICD-10-CM | POA: Diagnosis not present

## 2017-02-17 DIAGNOSIS — M25571 Pain in right ankle and joints of right foot: Secondary | ICD-10-CM | POA: Diagnosis not present

## 2017-02-17 DIAGNOSIS — M25671 Stiffness of right ankle, not elsewhere classified: Secondary | ICD-10-CM | POA: Diagnosis not present

## 2017-02-20 ENCOUNTER — Other Ambulatory Visit: Payer: Self-pay

## 2017-02-20 DIAGNOSIS — I10 Essential (primary) hypertension: Secondary | ICD-10-CM

## 2017-02-20 MED ORDER — AMLODIPINE BESY-BENAZEPRIL HCL 10-40 MG PO CAPS: 1.0000 | ORAL_CAPSULE | Freq: Every day | ORAL | 3 refills | Status: DC

## 2017-02-21 ENCOUNTER — Other Ambulatory Visit: Payer: BLUE CROSS/BLUE SHIELD

## 2017-02-21 ENCOUNTER — Other Ambulatory Visit: Payer: Self-pay

## 2017-02-21 DIAGNOSIS — N401 Enlarged prostate with lower urinary tract symptoms: Secondary | ICD-10-CM

## 2017-02-23 ENCOUNTER — Ambulatory Visit: Payer: BLUE CROSS/BLUE SHIELD | Admitting: Urology

## 2017-02-24 DIAGNOSIS — M25671 Stiffness of right ankle, not elsewhere classified: Secondary | ICD-10-CM | POA: Diagnosis not present

## 2017-02-24 DIAGNOSIS — M25571 Pain in right ankle and joints of right foot: Secondary | ICD-10-CM | POA: Diagnosis not present

## 2017-02-28 ENCOUNTER — Ambulatory Visit (INDEPENDENT_AMBULATORY_CARE_PROVIDER_SITE_OTHER): Payer: BLUE CROSS/BLUE SHIELD | Admitting: Family Medicine

## 2017-02-28 VITALS — BP 134/76 | HR 82 | Temp 99.0°F | Resp 18 | Wt 290.0 lb

## 2017-02-28 DIAGNOSIS — M25511 Pain in right shoulder: Secondary | ICD-10-CM | POA: Diagnosis not present

## 2017-02-28 DIAGNOSIS — M546 Pain in thoracic spine: Secondary | ICD-10-CM | POA: Diagnosis not present

## 2017-02-28 DIAGNOSIS — I10 Essential (primary) hypertension: Secondary | ICD-10-CM | POA: Diagnosis not present

## 2017-02-28 DIAGNOSIS — R739 Hyperglycemia, unspecified: Secondary | ICD-10-CM | POA: Diagnosis not present

## 2017-02-28 DIAGNOSIS — G8929 Other chronic pain: Secondary | ICD-10-CM

## 2017-02-28 DIAGNOSIS — G4709 Other insomnia: Secondary | ICD-10-CM | POA: Diagnosis not present

## 2017-02-28 LAB — POCT GLYCOSYLATED HEMOGLOBIN (HGB A1C): Hemoglobin A1C: 5.9

## 2017-02-28 MED ORDER — PREDNISONE 10 MG (21) PO TBPK: ORAL_TABLET | ORAL | 0 refills | Status: DC

## 2017-02-28 NOTE — Progress Notes (Signed)
Justin Soto  MRN: [id number] DOB: 1961/09/11  Subjective:  HPI  Patient is here for follow up. LOV was 01/10/17 for acute issues. Last follow up visit was on 11/28/16. Patient is not checking his sugar. Lab Results  Component Value Date   HGBA1C 5.6 11/28/2016  Patient is not checking his b/p. No cardiac symptoms. He did develop a cough over the weekend and sore throat when he starts coughing, denies any drainage, no fever.  He just had a foot surgery and since then his right shoulder and right arm have been hurting. Dr Sabra Heck has giving him injections in the shoulder but it has been hurting really bad. He is not sure if its the way he was laying down for the surgery. He has had to take Oxycodone that was given to him to help with foot pain for shoulder pain. Foot has been doing well since the surgery.  Labs: BMP, CBC on 01/05/17, Cmet on 11/08/16, TSH and Lipid on 03/30/16. Patient Active Problem List   Diagnosis Date Noted  . Kidney stone on right side 01/15/2017  . OSA (obstructive sleep apnea) 11/08/2016  . Hyperglycemia 07/26/2016  . ADD (attention deficit disorder) 09/08/2015  . Absolute anemia 09/08/2015  . Achilles bursitis 09/08/2015  . Back pain, thoracic 09/08/2015  . Benign prostatic hypertrophy with lower urinary tract symptoms (LUTS) 09/08/2015  . Cellulitis 09/08/2015  . Clinical depression 09/08/2015  . Deep vein thrombosis ([place]) 09/08/2015  . Acid reflux 09/08/2015  . Bulge of cervical disc without myelopathy 09/08/2015  . 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 BP (high blood pressure) 09/08/2015  . Cannot sleep 09/08/2015  . Anemia, iron deficiency 09/08/2015  . Bad memory 09/08/2015  . Adiposity 09/08/2015  . Allergic rhinitis, seasonal 09/08/2015  . 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 Fast heart beat 09/08/2015  . Cervical spondylosis with myelopathy and radiculopathy 11/13/2014  . Hernia, inguinal,  unilateral 04/06/2009    Past Medical History:  Diagnosis Date  . Anemia   . Enlarged prostate    hx of  . Environmental and seasonal allergies   . Frequency of urination   . GERD (gastroesophageal reflux disease)   . History of kidney stones   . HOH (hard of hearing)    wears bilateral hearing aids  . Hypertension   . Irregular heart beat    Pt feels this every once in a while; PCP aware but stated it was OK  . Kidney stone on right side 01/15/2017  . Leg pain, right    Take Lyrica and Naproxen  . Neck pain of over 3 months duration   . Sleep apnea    NO CPAP  . Tick fever     Social History   Social History  . Marital status: Married    Spouse name: N/A  . Number of children: N/A  . Years of education: N/A   Occupational History  . Not on file.   Social History Main Topics  . Smoking status: Never Smoker  . Smokeless tobacco: Never Used  . Alcohol use No  . Drug use: No  . Sexual activity: Not on file   Other Topics Concern  . Not on file   Social History Narrative  . No narrative on file    Outpatient Encounter Prescriptions as of 02/28/2017  Medication Sig  . amLODipine-benazepril (LOTREL) 10-40 MG capsule Take 1 capsule by mouth daily.  Marland Kitchen aspirin EC 81  MG tablet Take 81 mg by mouth daily.  . cetirizine (ZYRTEC) 10 MG tablet 1 to 2 tablets daily as needed (Patient taking differently: Take 10-20 mg by mouth daily as needed for allergies. 1 to 2 tablets daily as needed)  . diazepam (VALIUM) 5 MG tablet Take 1 tablet (5 mg total) by mouth at bedtime as needed for anxiety.  . finasteride (PROSCAR) 5 MG tablet Take 1 tablet (5 mg total) by mouth daily.  . fluticasone (FLONASE) 50 MCG/ACT nasal spray Place 2 sprays into both nostrils daily.  . hydrochlorothiazide (HYDRODIURIL) 25 MG tablet Take 1 tablet (25 mg total) by mouth daily.  . meloxicam (MOBIC) 15 MG tablet Take 15 mg by mouth daily.  . metoprolol tartrate (LOPRESSOR) 25 MG tablet Take 1 tablet (25 mg  total) by mouth 2 (two) times daily.  . montelukast (SINGULAIR) 10 MG tablet Take 1 tablet (10 mg total) by mouth every morning.  . Multiple Vitamin (MULTIVITAMIN WITH MINERALS) TABS tablet Take 1 tablet by mouth daily.  Marland Kitchen omeprazole (PRILOSEC) 10 MG capsule Take 10 mg by mouth daily.  Marland Kitchen oxyCODONE (ROXICODONE) 5 MG immediate release tablet Take 1 tablet (5 mg total) by mouth every 6 (six) hours as needed for severe pain.  . tamsulosin (FLOMAX) 0.4 MG CAPS capsule Take 2 capsules (0.8 mg total) by mouth daily.  . [DISCONTINUED] pregabalin (LYRICA) 300 MG capsule Take 1 capsule (300 mg total) by mouth 2 (two) times daily.   No facility-administered encounter medications on file as of 02/28/2017.     Allergies  Allergen Reactions  . Methocarbamol Other (See Comments)    Numbness, and change of taste   . Tizanidine Nausea Only    Review of Systems  Constitutional: Negative.   Respiratory: Positive for cough and shortness of breath (better).   Cardiovascular: Negative.   Gastrointestinal: Negative.   Musculoskeletal: Positive for back pain, joint pain, myalgias and neck pain.  Neurological: Negative.   Psychiatric/Behavioral: The patient has insomnia.     Objective:  BP 134/76   Pulse 82   Temp 99 F (37.2 C)   Resp 18   Wt 290 lb (131.5 kg)   BMI 46.81 kg/m   Physical Exam  Constitutional: He is oriented to person, place, and time and well-developed, well-nourished, and in no distress.  HENT:  Head: Normocephalic and atraumatic.  Right Ear: External ear normal.  Left Ear: External ear normal.  Mouth/Throat: Oropharynx is clear and moist.  Eyes: Conjunctivae are normal. Pupils are equal, round, and reactive to light.  Neck: Normal range of motion. Neck supple.  Cardiovascular: Normal rate, regular rhythm and intact distal pulses.   No murmur heard. Pulmonary/Chest: Effort normal and breath sounds normal. No respiratory distress. He has no wheezes.  Musculoskeletal: He  exhibits tenderness.  Tender over the right AC joint.  Neurological: He is alert and oriented to person, place, and time.   Assessment and Plan :  1. Hyperglycemia A1C 5.9. Worse. Follow for now. Work on habits. - POCT HgB A1C  2. Other insomnia Stable.  3. Essential hypertension Stable.  4. Chronic thoracic back pain, unspecified back pain laterality 5. Chronic right shoulder pain Flared up. Advised patient to follow up with orthopedic. Rather not do narcotics. Try Prednisone, advised patient to not take Meloxicam while on prednisone.  HPI, Exam and A&P transcribed under direction and in the presence of Miguel Aschoff, MD. I have done the exam and reviewed the chart and it is accurate to  the best of my knowledge. Development worker, community has been used and  any errors in dictation or transcription are unintentional. Miguel Aschoff M.D. [facility]

## 2017-03-14 ENCOUNTER — Other Ambulatory Visit: Payer: Self-pay | Admitting: Family Medicine

## 2017-03-14 DIAGNOSIS — J302 Other seasonal allergic rhinitis: Secondary | ICD-10-CM

## 2017-03-14 MED ORDER — CETIRIZINE HCL 10 MG PO TABS: ORAL_TABLET | ORAL | 12 refills | Status: DC

## 2017-03-14 NOTE — Telephone Encounter (Signed)
[facility] faxed a request for the following medication. Thanks CC  cetirizine (ZYRTEC) 10 MG  Take 1 to 2 tablets by Mouth one time daily as needed

## 2017-03-14 NOTE — Telephone Encounter (Signed)
Please advise 

## 2017-03-20 ENCOUNTER — Other Ambulatory Visit: Payer: Self-pay | Admitting: Urology

## 2017-03-20 DIAGNOSIS — R351 Nocturia: Secondary | ICD-10-CM

## 2017-03-22 ENCOUNTER — Ambulatory Visit (INDEPENDENT_AMBULATORY_CARE_PROVIDER_SITE_OTHER): Payer: BLUE CROSS/BLUE SHIELD | Admitting: Physician Assistant

## 2017-03-22 ENCOUNTER — Encounter: Payer: Self-pay | Admitting: Physician Assistant

## 2017-03-22 VITALS — BP 148/80 | HR 86 | Temp 98.3°F | Resp 16 | Wt 289.6 lb

## 2017-03-22 DIAGNOSIS — R110 Nausea: Secondary | ICD-10-CM | POA: Diagnosis not present

## 2017-03-22 DIAGNOSIS — H9313 Tinnitus, bilateral: Secondary | ICD-10-CM | POA: Diagnosis not present

## 2017-03-22 DIAGNOSIS — H9193 Unspecified hearing loss, bilateral: Secondary | ICD-10-CM | POA: Diagnosis not present

## 2017-03-22 DIAGNOSIS — H8303 Labyrinthitis, bilateral: Secondary | ICD-10-CM

## 2017-03-22 DIAGNOSIS — H9202 Otalgia, left ear: Secondary | ICD-10-CM | POA: Diagnosis not present

## 2017-03-22 MED ORDER — ONDANSETRON HCL 4 MG PO TABS: 4.0000 mg | ORAL_TABLET | Freq: Three times a day (TID) | ORAL | 0 refills | Status: DC | PRN

## 2017-03-22 MED ORDER — PREDNISONE 10 MG (21) PO TBPK: ORAL_TABLET | ORAL | 0 refills | Status: DC

## 2017-03-22 MED ORDER — METHYLPREDNISOLONE ACETATE 80 MG/ML IJ SUSP
80.0000 mg | Freq: Once | INTRAMUSCULAR | Status: AC
  Administered 2017-03-22: 80 mg via INTRAMUSCULAR
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
## 2017-03-22 NOTE — Patient Instructions (Signed)
Hearing Loss Hearing loss is a partial or total loss of the ability to hear. This can be temporary or permanent, and it can happen in one or both ears. Hearing loss may be referred to as deafness. Medical care is necessary to treat hearing loss properly and to prevent the condition from getting worse. Your hearing may partially or completely come back, depending on what caused your hearing loss and how severe it is. In some cases, hearing loss is permanent. What are the causes? Common causes of hearing loss include:  Too much wax in the ear canal.  Infection of the ear canal or middle ear.  Fluid in the middle ear.  Injury to the ear or surrounding area.  An object stuck in the ear.  Prolonged exposure to loud sounds, such as music. Less common causes of hearing loss include:  Tumors in the ear.  Viral or bacterial infections, such as meningitis.  A hole in the eardrum (perforated eardrum).  Problems with the hearing nerve that sends signals between the brain and the ear.  Certain medicines. What are the signs or symptoms? Symptoms of this condition may include:  Difficulty telling the difference between sounds.  Difficulty following a conversation when there is background noise.  Lack of response to sounds in your environment. This may be most noticeable when you do not respond to startling sounds.  Needing to turn up the volume on the television, radio, etc.  Ringing in the ears.  Dizziness.  Pain in the ears. How is this diagnosed? This condition is diagnosed based on a physical exam and a hearing test (audiometry). The audiometry test will be performed by a hearing specialist (audiologist). You may also be referred to an ear, nose, and throat (ENT) specialist (otolaryngologist). How is this treated? Treatment for recent onset of hearing loss may include:  Ear wax removal.  Being prescribed medicines to prevent infection (antibiotics).  Being prescribed  medicines to reduce inflammation (corticosteroids). Follow these instructions at home:  If you were prescribed an antibiotic medicine, take it as told by your health care provider. Do not stop taking the antibiotic even if you start to feel better.  Take over-the-counter and prescription medicines only as told by your health care provider.  Avoid loud noises.  Return to your normal activities as told by your health care provider. Ask your health care provider what activities are safe for you.  Keep all follow-up visits as told by your health care provider. This is important. Contact a health care provider if:  You feel dizzy.  You develop new symptoms.  You vomit or feel nauseous.  You have a fever. Get help right away if:  You develop sudden changes in your vision.  You have severe ear pain.  You have new or increased weakness.  You have a severe headache. This information is not intended to replace advice given to you by your health care provider. Make sure you discuss any questions you have with your health care provider. Document Released: 11/28/2005 Document Revised: 05/05/2016 Document Reviewed: 04/15/2015 Elsevier Interactive Patient Education  2017 [employer]. Tinnitus Tinnitus refers to hearing a sound when there is no actual source for that sound. This is often described as ringing in the ears. However, people with this condition may hear a variety of noises. A person may hear the sound in one ear or in both ears. The sounds of tinnitus can be soft, loud, or somewhere in between. Tinnitus can last for a few  seconds or can be constant for days. It may go away without treatment and come back at various times. When tinnitus is constant or happens often, it can lead to other problems, such as trouble sleeping and trouble concentrating. Almost everyone experiences tinnitus at some point. Tinnitus that is long-lasting (chronic) or comes back often is a problem that may  require medical attention. What are the causes? The cause of tinnitus is often not known. In some cases, it can result from other problems or conditions, including:  Exposure to loud noises from machinery, music, or other sources.  Hearing loss.  Ear or sinus infections.  Earwax buildup.  A foreign object in the ear.  Use of certain medicines.  Use of alcohol and caffeine.  High blood pressure.  Heart diseases.  Anemia.  Allergies.  Meniere disease.  Thyroid problems.  Tumors.  An enlarged part of a weakened blood vessel (aneurysm). What are the signs or symptoms? The main symptom of tinnitus is hearing a sound when there is no source for that sound. It may sound like:  Buzzing.  Roaring.  Ringing.  Blowing air, similar to the sound heard when you listen to a seashell.  Hissing.  Whistling.  Sizzling.  Humming.  Running water.  A sustained musical note. How is this diagnosed? Tinnitus is diagnosed based on your symptoms. Your health care provider will do a physical exam. A comprehensive hearing exam (audiologic exam) will be done if your tinnitus:  Affects only one ear (unilateral).  Causes hearing difficulties.  Lasts 6 months or longer. You may also need to see a health care provider who specializes in hearing disorders (audiologist). You may be asked to complete a questionnaire to determine the severity of your tinnitus. Tests may be done to help determine the cause and to rule out other conditions. These can include:  Imaging studies of your head and brain, such as:  A CT scan.  An MRI.  An imaging study of your blood vessels (angiogram). How is this treated? Treating an underlying medical condition can sometimes make tinnitus go away. If your tinnitus continues, other treatments may include:  Medicines, such as certain antidepressants or sleeping aids.  Sound generators to mask the tinnitus. These include:  Tabletop sound machines  that play relaxing sounds to help you fall asleep.  Wearable devices that fit in your ear and play sounds or music.  A small device that uses headphones to deliver a signal embedded in music (acoustic neural stimulation). In time, this may change the pathways of your brain and make you less sensitive to tinnitus. This device is used for very severe cases when no other treatment is working.  Therapy and counseling to help you manage the stress of living with tinnitus.  Using hearing aids or cochlear implants, if your tinnitus is related to hearing loss. Follow these instructions at home:  When possible, avoid being in loud places and being exposed to loud sounds.  Wear hearing protection, such as earplugs, when you are exposed to loud noises.  Do not take stimulants, such as nicotine, alcohol, or caffeine.  Practice techniques for reducing stress, such as meditation, yoga, or deep breathing.  Use a white noise machine, a humidifier, or other devices to mask the sound of tinnitus.  Sleep with your head slightly raised. This may reduce the impact of tinnitus.  Try to get plenty of rest each night. Contact a health care provider if:  You have tinnitus in just one ear.  Your tinnitus continues for 3 weeks or longer without stopping.  Home care measures are not helping.  You have tinnitus after a head injury.  You have tinnitus along with any of the following:  Dizziness.  Loss of balance.  Nausea and vomiting. This information is not intended to replace advice given to you by your health care provider. Make sure you discuss any questions you have with your health care provider. Document Released: 11/28/2005 Document Revised: 07/31/2016 Document Reviewed: 04/30/2014 Elsevier Interactive Patient Education  2017 [employer].

## 2017-03-23 ENCOUNTER — Encounter: Payer: Self-pay | Admitting: Dietician

## 2017-03-23 NOTE — Progress Notes (Signed)
Have not heard back from patient to reschedule his appointment from 02/06/17 (was waiting to recover from surgery). Sent discharge letter to MD.

## 2017-04-24 DIAGNOSIS — M7541 Impingement syndrome of right shoulder: Secondary | ICD-10-CM | POA: Diagnosis not present

## 2017-05-09 DIAGNOSIS — G4733 Obstructive sleep apnea (adult) (pediatric): Secondary | ICD-10-CM | POA: Diagnosis not present

## 2017-06-19 ENCOUNTER — Ambulatory Visit: Payer: BLUE CROSS/BLUE SHIELD | Admitting: Family Medicine

## 2017-06-21 ENCOUNTER — Other Ambulatory Visit: Payer: Self-pay

## 2017-06-21 ENCOUNTER — Other Ambulatory Visit: Payer: Self-pay | Admitting: Family Medicine

## 2017-06-21 DIAGNOSIS — I10 Essential (primary) hypertension: Secondary | ICD-10-CM

## 2017-06-21 MED ORDER — HYDROCHLOROTHIAZIDE 25 MG PO TABS: 25.0000 mg | ORAL_TABLET | Freq: Every day | ORAL | 1 refills | Status: DC

## 2017-06-21 MED ORDER — MONTELUKAST SODIUM 10 MG PO TABS: 10.0000 mg | ORAL_TABLET | ORAL | 1 refills | Status: DC

## 2017-06-21 NOTE — Telephone Encounter (Addendum)
[facility] faxed a request on the following medications. Thanks CC  montelukast (SINGULAIR) 10 MG tablet  >Take one tablet by mouth every morning.  hydrochlorothiazide (HYDRODIURIL) 25 MG tablet  >Take one tablet by mouth every day.

## 2017-06-21 NOTE — Telephone Encounter (Signed)
Refill sent in-aa 

## 2017-06-22 ENCOUNTER — Other Ambulatory Visit: Payer: Self-pay | Admitting: Family Medicine

## 2017-06-22 ENCOUNTER — Other Ambulatory Visit: Payer: Self-pay

## 2017-06-22 MED ORDER — FLUTICASONE PROPIONATE 50 MCG/ACT NA SUSP: 2.0000 | Freq: Every day | NASAL | 12 refills | Status: DC

## 2017-06-22 NOTE — Telephone Encounter (Signed)
[facility] faxed a request on the following medication. Thanks CC  fluticasone (FLONASE) 50 MCG/ACT nasal spray  >Use two (2) sprays in each nostril every day.

## 2017-07-10 NOTE — Progress Notes (Signed)
07/11/2017 1:12 PM   Justin Soto [birth year] [id number]  Referring provider: Jerrol Banana., MD [address] [place], [place] [address]  Chief Complaint  Patient presents with  . Urinary Frequency  . Testicle Pain    HPI: Patient is a 56 year old Caucasian male who presents today requesting an urgent appointment for left testicular pain and frequency of urination.  He states that he has had pain in his left testicle for the last three days becoming progressively worse.  He states the pain is a 6/10.  He describes it as achy.  The pain is constant, lasting several hours.  He states nothing makes the pain worse or better.    He is having urinary frequency, urgency and dysuria.  He has not been having fevers, chills, nausea or vomiting.  He has malaise.  His UA was unremarkable.   He has not had constipated or diarrhea.  He denies injury to the groin area.  He has not been sexually active.    CT Renal stone study in 12/2016 noted a right renal calculus, but no left renal stones.  Right inguinal hernia.  Left inguinal hernia repair.    PMH: Past Medical History:  Diagnosis Date  . Anemia   . Enlarged prostate    hx of  . Environmental and seasonal allergies   . Frequency of urination   . GERD (gastroesophageal reflux disease)   . History of kidney stones   . HOH (hard of hearing)    wears bilateral hearing aids  . Hypertension   . Irregular heart beat    Pt feels this every once in a while; PCP aware but stated it was OK  . Kidney stone on right side 01/15/2017  . Leg pain, right    Take Lyrica and Naproxen  . Neck pain of over 3 months duration   . Sleep apnea    NO CPAP  . Tick fever     Surgical History: Past Surgical History:  Procedure Laterality Date  . ANTERIOR CERVICAL DECOMP/DISCECTOMY FUSION N/A 11/13/2014   Procedure: Cervical three-four, Cervical six-seven anterior cervical decompression with fusion interbody prosthesis plating and  bonegraft;  Surgeon: Newman Pies, MD;  Location: [facility];  Service: Neurosurgery;  Laterality: N/A;  Cervical three-four, Cervical six-seven anterior cervical decompression with fusion interbody prosthesis plating and bonegraft  . BACK SURGERY     Lumbar X 2  . CALCANEAL OSTEOTOMY Right 01/11/2017   Procedure: CALCANEAL OSTEOTOMY;  Surgeon: Earnestine Leys, MD;  Location: [facility];  Service: Orthopedics;  Laterality: Right;  . COLONOSCOPY    . HERNIA REPAIR Left    groin  . LEG SURGERY Right    Had surgery on right calf X 5  . LITHOTRIPSY    . QUADRICEPS TENDON REPAIR Left 06/22/2015   Procedure: REPAIR QUADRICEP TENDON;  Surgeon: Earnestine Leys, MD;  Location: [facility];  Service: Orthopedics;  Laterality: Left;  Marland Kitchen QUADRICEPS TENDON REPAIR Left 08/12/2015   Procedure: REPAIR QUADRICEP TENDON;  Surgeon: Earnestine Leys, MD;  Location: [facility];  Service: Orthopedics;  Laterality: Left;  . ROTATOR CUFF REPAIR Left   . TONSILLECTOMY    . VASECTOMY     with hydrocele repaired    Home Medications:  Allergies as of 07/11/2017      Reactions   Methocarbamol Other (See Comments)   Numbness, and change of taste    Tizanidine Nausea Only      Medication List  Accurate as of 07/11/17  1:12 PM. Always use your most recent med list.          amLODipine-benazepril 10-40 MG capsule Commonly known as:  LOTREL Take 1 capsule by mouth daily.   aspirin EC 81 MG tablet Take 81 mg by mouth daily.   cetirizine 10 MG tablet Commonly known as:  ZYRTEC 1 to 2 tablets daily as needed   finasteride 5 MG tablet Commonly known as:  PROSCAR Take 1 tablet (5 mg total) by mouth daily.   fluticasone 50 MCG/ACT nasal spray Commonly known as:  FLONASE Place 2 sprays into both nostrils daily.   hydrochlorothiazide 25 MG tablet Commonly known as:  HYDRODIURIL Take 1 tablet (25 mg total) by mouth daily.   meloxicam 15 MG tablet Commonly known as:  MOBIC Take 15 mg by mouth daily.   metoprolol  tartrate 25 MG tablet Commonly known as:  LOPRESSOR Take 1 tablet (25 mg total) by mouth 2 (two) times daily.   montelukast 10 MG tablet Commonly known as:  SINGULAIR Take 1 tablet (10 mg total) by mouth every morning.   multivitamin with minerals Tabs tablet Take 1 tablet by mouth daily.   omeprazole 10 MG capsule Commonly known as:  PRILOSEC Take 10 mg by mouth daily.   sulfamethoxazole-trimethoprim 800-160 MG tablet Commonly known as:  BACTRIM DS,SEPTRA DS Take 1 tablet by mouth every 12 (twelve) hours.   tamsulosin 0.4 MG Caps capsule Commonly known as:  FLOMAX TAKE 2 CAPSULES BY MOUTH EVERY DAY       Allergies:  Allergies  Allergen Reactions  . Methocarbamol Other (See Comments)    Numbness, and change of taste   . Tizanidine Nausea Only    Family History: Family History  Problem Relation Age of Onset  . Prostate cancer Father   . Heart disease Father   . Diabetes Father   . Alzheimer's disease Father   . Anemia Mother   . Osteoporosis Mother   . Diabetes Brother   . Hypertension Brother   . Anemia Daughter   . Kidney disease Neg Hx   . Bladder Cancer Neg Hx     Social History:  reports that he has never smoked. He has never used smokeless tobacco. He reports that he does not drink alcohol or use drugs.  ROS: UROLOGY Frequent Urination?: Yes Hard to postpone urination?: No Burning/pain with urination?: No Get up at night to urinate?: No Leakage of urine?: No Urine stream starts and stops?: No Trouble starting stream?: No Do you have to strain to urinate?: No Blood in urine?: No Urinary tract infection?: No Sexually transmitted disease?: No Injury to kidneys or bladder?: No Painful intercourse?: No Weak stream?: No Erection problems?: No Penile pain?: No  Gastrointestinal Nausea?: No Vomiting?: No Indigestion/heartburn?: No Diarrhea?: No Constipation?: No  Constitutional Fever: No Night sweats?: No Weight loss?: No Fatigue?:  No  Skin Skin rash/lesions?: No Itching?: No  Eyes Blurred vision?: No Double vision?: No  Ears/Nose/Throat Sore throat?: No Sinus problems?: No  Hematologic/Lymphatic Swollen glands?: No Easy bruising?: No  Cardiovascular Leg swelling?: No Chest pain?: No  Respiratory Cough?: No Shortness of breath?: No  Endocrine Excessive thirst?: No  Musculoskeletal Back pain?: No Joint pain?: No  Neurological Headaches?: No Dizziness?: No  Psychologic Depression?: No Anxiety?: No  Physical Exam: BP (!) 152/99   Pulse 83   Ht 5\' 6"  (1.676 m)   Wt 285 lb (129.3 kg)   BMI 46.00 kg/m  Constitutional: Well nourished. Alert and oriented, No acute distress. HEENT: [place] AT, moist mucus membranes. Trachea midline, no masses. Cardiovascular: No clubbing, cyanosis, or edema. Respiratory: Normal respiratory effort, no increased work of breathing. GI: Obese.  Abdomen is soft, non tender, non distended, no abdominal masses. Liver and spleen not palpable.  Exquisitely tender in the left lower quadrant.  No distinct hernias appreciated.  Stool sample for occult testing is not indicated.   GU: No CVA tenderness.  No bladder fullness or masses.  Patient with buried penis.  Urethral meatus is patent.  No penile discharge. No penile lesions or rashes. Scrotum without lesions, cysts, rashes and/or edema.  Testicles are located scrotally bilaterally. No masses are appreciated in the testicles. Left and right epididymis are normal. Rectal: Deferred.   Skin: No rashes, bruises or suspicious lesions. Lymph: No cervical or inguinal adenopathy. Neurologic: Grossly intact, no focal deficits, moving all 4 extremities. Psychiatric: Normal mood and affect.  Laboratory Data: Lab Results  Component Value Date   WBC 5.6 01/05/2017   HGB 10.9 (L) 01/05/2017   HCT 33.5 (L) 01/05/2017   MCV 62.0 (L) 01/05/2017   PLT 156 01/05/2017    Lab Results  Component Value Date   CREATININE 0.97  01/05/2017    Lab Results  Component Value Date   PSA 3.31 10/28/2016   PSA 3.2 05/16/2014       PSA                        3.2                                                                   09/09/2015  Lab Results  Component Value Date   HGBA1C 5.9 02/28/2017     Lab Results  Component Value Date   AST 34 11/08/2016   Lab Results  Component Value Date   ALT 32 11/08/2016   Urinalysis Unremarkable.  See EPIC.  I have reviewed the labs  Pertinent Imaging: CLINICAL DATA:  Acute right-sided flank pain. History of kidney stones. Prior lithotripsy.  EXAM: CT ABDOMEN AND PELVIS WITHOUT CONTRAST  TECHNIQUE: Multidetector CT imaging of the abdomen and pelvis was performed following the standard protocol without IV contrast.  COMPARISON:  CT scan 03/09/2011  FINDINGS: Lower chest: The lung bases are clear of acute process. No pleural effusion or pulmonary lesions. The heart is normal in size. No pericardial effusion. Age advanced three-vessel coronary artery calcifications are noted. The distal esophagus and aorta are unremarkable.  Hepatobiliary: No focal hepatic lesions or intrahepatic biliary dilatation. The gallbladder is normal. No common bile duct dilatation.  Pancreas: No mass, inflammation or ductal dilatation.  Spleen: Normal size.  No focal lesions.  Adrenals/Urinary Tract: The adrenal glands are normal.  Small right-sided renal calculi are noted. No obstructing ureteral calculi or bladder calculi. The left kidney demonstrates a simple appearing cyst. No renal calculi or hydronephrosis. No left-sided ureteral calculi.  Stomach/Bowel: The stomach, duodenum, small bowel and colon are grossly normal without oral contrast. No inflammatory changes, mass lesions or obstructive findings. The terminal ileum and appendix are normal.  Vascular/Lymphatic: Scattered atherosclerotic calcifications involving the aorta but no aneurysm. No branch  vessel calcifications. No mesenteric or retroperitoneal  mass or adenopathy. Small scattered lymph nodes are noted.  Reproductive: The prostate gland and seminal vesicles are unremarkable.  Other: Surgical changes from a left inguinal hernia repair. No recurrent hernia. There is a right inguinal hernia containing fat. This appears stable when compared to the prior CT scan. Small bilateral inguinal lymph nodes but no mass or overt adenopathy.  Musculoskeletal: Advanced degenerative changes involving the lower thoracic and lumbar spine with multilevel disc disease and facet disease in the lumbar spine. No acute bony findings or destructive bony changes.  IMPRESSION: 1. Right-sided renal calculi but no obstructing ureteral calculi or bladder calculi. 2. No acute abdominal/pelvic findings, mass lesions or lymphadenopathy. 3. Right inguinal hernia containing fat. Prior left inguinal hernia repair without recurrent hernia. 4. 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   Electronically Signed   By: Marijo Sanes M.D.   On: 01/06/2017 11:36  CLINICAL DATA:  Patient has left inguinal and testicle pain since Friday. The pain is worse. No nausea, vomiting and diarrhea. Hx of inguinal hernia repair 7 years ago.  EXAM: CT ABDOMEN AND PELVIS WITH CONTRAST  TECHNIQUE: Multidetector CT imaging of the abdomen and pelvis was performed using the standard protocol following bolus administration of intravenous contrast.  CONTRAST:  177mL ISOVUE-300 IOPAMIDOL (ISOVUE-300) INJECTION 61%  COMPARISON:  12/17/2016  FINDINGS: Lower chest: No acute abnormality.  Hepatobiliary: No focal liver abnormality is seen. No gallstones, gallbladder wall thickening, or biliary dilatation.  Pancreas: Unremarkable. No pancreatic ductal dilatation or surrounding inflammatory changes.  Spleen: Normal in size without focal  abnormality.  Adrenals/Urinary Tract: Normal adrenals. Right nephrolithiasis, largest 3 mm in the upper pole renal collecting system. No hydronephrosis. Stable 4 cm cyst, lower pole kidney. Urinary bladder incompletely distended.  Stomach/Bowel: Stomach and small bowel decompressed. Normal appendix. Colon nondilated. A few scattered diverticula in the distal descending and sigmoid portions, without adjacent inflammatory/ edematous change.  Vascular/Lymphatic: Mild scattered calcified aortic plaque without aneurysm. No abdominal or pelvic.  Reproductive: Prostate is unremarkable.  Other: No ascites.  No free air.  Musculoskeletal: Sutures post laparoscopic left inguinal hernia repair. Spondylitic changes in the lower thoracic and lumbar spine most severe L3-S1. Negative for fracture or worrisome bone lesion.  IMPRESSION: 1. No acute findings. 2. Right nephrolithiasis without hydronephrosis. 3. Descending and sigmoid  diverticulosis without acute findings.   Electronically Signed   By: Lucrezia Europe M.D.   On: 07/11/2017 12:12    Assessment & Plan:    1. Left inguinal pain  - due to patient's habitus it is difficult to palpate a possible hernia that may be incarcerated - patient was exquisitely tender in the left groin area and no pain was elicited when palpated the right  - STAT CT ordered - no sign of hernia   2. Left epididymitis  - start Septra DS, one tablet twice daily x 14 days  - RTC in 2 weeks for exam and symptom recheck  Advised to contact our office or seek treatment in the ED if becomes febrile or pain/ vomiting are difficult control in order to arrange for emergent/urgent intervention   3. Urinary frequency  - UA was unremarkable  - reassess when RTC in 2 weeks    Return in about 2 weeks (around 07/25/2017) for recheck.  These notes generated with voice recognition software. I apologize for typographical errors.  Zara Council,  [facility] [address], [place], [place] [address] [phone]

## 2017-07-11 ENCOUNTER — Encounter: Payer: Self-pay | Admitting: Urology

## 2017-07-11 ENCOUNTER — Ambulatory Visit (INDEPENDENT_AMBULATORY_CARE_PROVIDER_SITE_OTHER): Payer: BLUE CROSS/BLUE SHIELD | Admitting: Urology

## 2017-07-11 ENCOUNTER — Ambulatory Visit
Admission: RE | Admit: 2017-07-11 | Discharge: 2017-07-11 | Disposition: A | Discharge status: Home / Self Care | Payer: BLUE CROSS/BLUE SHIELD | Source: Ambulatory Visit | Attending: Urology | Admitting: Urology

## 2017-07-11 VITALS — BP 152/99 | HR 83 | Ht 66.0 in | Wt 285.0 lb

## 2017-07-11 DIAGNOSIS — K5730 Diverticulosis of large intestine without perforation or abscess without bleeding: Secondary | ICD-10-CM | POA: Insufficient documentation

## 2017-07-11 DIAGNOSIS — R1032 Left lower quadrant pain: Secondary | ICD-10-CM | POA: Diagnosis not present

## 2017-07-11 DIAGNOSIS — R350 Frequency of micturition: Secondary | ICD-10-CM | POA: Diagnosis not present

## 2017-07-11 DIAGNOSIS — N451 Epididymitis: Secondary | ICD-10-CM

## 2017-07-11 DIAGNOSIS — N200 Calculus of kidney: Secondary | ICD-10-CM | POA: Insufficient documentation

## 2017-07-11 DIAGNOSIS — Z9889 Other specified postprocedural states: Secondary | ICD-10-CM | POA: Insufficient documentation

## 2017-07-11 DIAGNOSIS — R1110 Vomiting, unspecified: Secondary | ICD-10-CM | POA: Diagnosis not present

## 2017-07-11 DIAGNOSIS — N281 Cyst of kidney, acquired: Secondary | ICD-10-CM | POA: Diagnosis not present

## 2017-07-11 DIAGNOSIS — R197 Diarrhea, unspecified: Secondary | ICD-10-CM | POA: Diagnosis not present

## 2017-07-11 LAB — URINALYSIS, COMPLETE
Bilirubin, UA: NEGATIVE
Glucose, UA: NEGATIVE
Ketones, UA: NEGATIVE
Leukocytes, UA: NEGATIVE
Nitrite, UA: NEGATIVE
Protein, UA: NEGATIVE
Specific Gravity, UA: 1.025 (ref 1.005–1.030)
Urobilinogen, Ur: 0.2 mg/dL (ref 0.2–1.0)
pH, UA: 6.5 (ref 5.0–7.5)

## 2017-07-11 MED ORDER — SULFAMETHOXAZOLE-TRIMETHOPRIM 800-160 MG PO TABS: 1.0000 | ORAL_TABLET | Freq: Two times a day (BID) | ORAL | 0 refills | Status: DC

## 2017-07-11 MED ORDER — IOPAMIDOL (ISOVUE-300) INJECTION 61%
100.0000 mL | Freq: Once | INTRAVENOUS | Status: AC | PRN
  Administered 2017-07-11: 100 mL via INTRAVENOUS

## 2017-07-12 ENCOUNTER — Telehealth: Payer: Self-pay

## 2017-07-12 NOTE — Telephone Encounter (Signed)
-----   Message from Nori Riis, PA-C sent at 07/11/2017  1:06 PM EDT ----- Please let Mr. Lovejoy know that I have sent a script for Septra DS into [facility].  It should be twice a day for 14 days.  I would like to see him in two weeks.

## 2017-07-12 NOTE — Telephone Encounter (Signed)
Spoke with pt in reference to abx and 2 week f/u. Pt voiced understanding. F/u appt made.

## 2017-07-24 ENCOUNTER — Ambulatory Visit (INDEPENDENT_AMBULATORY_CARE_PROVIDER_SITE_OTHER): Payer: BLUE CROSS/BLUE SHIELD | Admitting: Urology

## 2017-07-24 ENCOUNTER — Encounter: Payer: Self-pay | Admitting: Urology

## 2017-07-24 VITALS — BP 105/66 | HR 87 | Ht 66.0 in | Wt 282.7 lb

## 2017-07-24 DIAGNOSIS — N451 Epididymitis: Secondary | ICD-10-CM

## 2017-07-24 LAB — URINALYSIS, COMPLETE
Bilirubin, UA: NEGATIVE
Glucose, UA: NEGATIVE
Ketones, UA: NEGATIVE
Leukocytes, UA: NEGATIVE
Nitrite, UA: NEGATIVE
Protein, UA: NEGATIVE
RBC, UA: NEGATIVE
Specific Gravity, UA: >1.03 — ABNORMAL HIGH (ref 1.005–1.030)
Urobilinogen, Ur: 0.2 mg/dL (ref 0.2–1.0)
pH, UA: 5.5 (ref 5.0–7.5)

## 2017-07-24 MED ORDER — MELOXICAM 15 MG PO TABS: 15.0000 mg | ORAL_TABLET | Freq: Every day | ORAL | 0 refills | Status: DC

## 2017-07-24 NOTE — Progress Notes (Signed)
07/24/2017 9:26 AM   Justin Soto January 01, 1961 [id number]  Referring provider: Jerrol Banana., MD [address] [place], [place] [address]  Chief Complaint  Patient presents with  . Testicle Pain    HPI: Justin Soto:  Seen today requesting an urgent appointment for left testicular pain and frequency of urination.  He states that he has had pain in his left testicle for the last three days becoming progressively worse.  He states the pain is a 6/10.  He describes it as achy.  The pain is constant, lasting several hours. Distant CT scan demonstrated a right sided renal stone nonobstructing and a right-sided inguinal hernia with a prior left inguinal hernia repair with no recurrent hernia. Apparently the patient was very tender in the left groin and was treated empirically with Septra. Repeat CT scan was unchanged  Today Frequency has normalized. He is back to baseline in that regard. He still has pain in the left hemiscrotum. It radiates throughout the left inguinal area or cord. He also has left iliac crest discomfort and sometimes left back pain. He has had previous back surgery  On physical examination I did not feel a hernia. I felt that the area of the epididymis was reproducibly tender   PMH: Past Medical History:  Diagnosis Date  . Anemia   . Enlarged prostate    hx of  . Environmental and seasonal allergies   . Frequency of urination   . GERD (gastroesophageal reflux disease)   . History of kidney stones   . HOH (hard of hearing)    wears bilateral hearing aids  . Hypertension   . Irregular heart beat    Pt feels this every once in a while; PCP aware but stated it was OK  . Kidney stone on right side 01/15/2017  . Leg pain, right    Take Lyrica and Naproxen  . Neck pain of over 3 months duration   . Sleep apnea    NO CPAP  . Tick fever     Surgical History: Past Surgical History:  Procedure Laterality Date  . ANTERIOR CERVICAL DECOMP/DISCECTOMY  FUSION N/A 11/13/2014   Procedure: Cervical three-four, Cervical six-seven anterior cervical decompression with fusion interbody prosthesis plating and bonegraft;  Surgeon: Newman Pies, MD;  Location: [facility];  Service: Neurosurgery;  Laterality: N/A;  Cervical three-four, Cervical six-seven anterior cervical decompression with fusion interbody prosthesis plating and bonegraft  . BACK SURGERY     Lumbar X 2  . CALCANEAL OSTEOTOMY Right 01/11/2017   Procedure: CALCANEAL OSTEOTOMY;  Surgeon: Earnestine Leys, MD;  Location: [facility];  Service: Orthopedics;  Laterality: Right;  . COLONOSCOPY    . HERNIA REPAIR Left    groin  . LEG SURGERY Right    Had surgery on right calf X 5  . LITHOTRIPSY    . QUADRICEPS TENDON REPAIR Left 06/22/2015   Procedure: REPAIR QUADRICEP TENDON;  Surgeon: Earnestine Leys, MD;  Location: [facility];  Service: Orthopedics;  Laterality: Left;  Marland Kitchen QUADRICEPS TENDON REPAIR Left 08/12/2015   Procedure: REPAIR QUADRICEP TENDON;  Surgeon: Earnestine Leys, MD;  Location: [facility];  Service: Orthopedics;  Laterality: Left;  . ROTATOR CUFF REPAIR Left   . TONSILLECTOMY    . VASECTOMY     with hydrocele repaired    Home Medications:  Allergies as of 07/24/2017      Reactions   Methocarbamol Other (See Comments)   Numbness, and change of taste    Tizanidine Nausea  Only      Medication List       Accurate as of 07/24/17  9:26 AM. Always use your most recent med list.          amLODipine-benazepril 10-40 MG capsule Commonly known as:  LOTREL Take 1 capsule by mouth daily.   aspirin EC 81 MG tablet Take 81 mg by mouth daily.   cetirizine 10 MG tablet Commonly known as:  ZYRTEC 1 to 2 tablets daily as needed   finasteride 5 MG tablet Commonly known as:  PROSCAR Take 1 tablet (5 mg total) by mouth daily.   fluticasone 50 MCG/ACT nasal spray Commonly known as:  FLONASE Place 2 sprays into both nostrils daily.   hydrochlorothiazide 25 MG tablet Commonly known as:   HYDRODIURIL Take 1 tablet (25 mg total) by mouth daily.   meloxicam 15 MG tablet Commonly known as:  MOBIC Take 1 tablet (15 mg total) by mouth daily.   metoprolol tartrate 25 MG tablet Commonly known as:  LOPRESSOR Take 1 tablet (25 mg total) by mouth 2 (two) times daily.   montelukast 10 MG tablet Commonly known as:  SINGULAIR Take 1 tablet (10 mg total) by mouth every morning.   multivitamin with minerals Tabs tablet Take 1 tablet by mouth daily.   omeprazole 10 MG capsule Commonly known as:  PRILOSEC Take 10 mg by mouth daily.   sulfamethoxazole-trimethoprim 800-160 MG tablet Commonly known as:  BACTRIM DS,SEPTRA DS Take 1 tablet by mouth every 12 (twelve) hours.   tamsulosin 0.4 MG Caps capsule Commonly known as:  FLOMAX TAKE 2 CAPSULES BY MOUTH EVERY DAY       Allergies:  Allergies  Allergen Reactions  . Methocarbamol Other (See Comments)    Numbness, and change of taste   . Tizanidine Nausea Only    Family History: Family History  Problem Relation Age of Onset  . Prostate cancer Father   . Heart disease Father   . Diabetes Father   . Alzheimer's disease Father   . Anemia Mother   . Osteoporosis Mother   . Diabetes Brother   . Hypertension Brother   . Anemia Daughter   . Kidney disease Neg Hx   . Bladder Cancer Neg Hx     Social History:  reports that he has never smoked. He has never used smokeless tobacco. He reports that he does not drink alcohol or use drugs.  ROS: UROLOGY Frequent Urination?: No Hard to postpone urination?: No Burning/pain with urination?: No Get up at night to urinate?: No Leakage of urine?: No Urine stream starts and stops?: No Trouble starting stream?: Yes Do you have to strain to urinate?: No Blood in urine?: No Urinary tract infection?: No Sexually transmitted disease?: No Injury to kidneys or bladder?: No Painful intercourse?: No Weak stream?: No Erection problems?: No Penile pain?:  No  Gastrointestinal Nausea?: No Vomiting?: No Indigestion/heartburn?: No Diarrhea?: No Constipation?: No  Constitutional Fever: No Night sweats?: No Weight loss?: No Fatigue?: No  Skin Skin rash/lesions?: No Itching?: No  Eyes Blurred vision?: No Double vision?: No  Ears/Nose/Throat Sore throat?: No Sinus problems?: No  Hematologic/Lymphatic Swollen glands?: No Easy bruising?: No  Cardiovascular Leg swelling?: No Chest pain?: No  Respiratory Cough?: No Shortness of breath?: No  Endocrine Excessive thirst?: No  Musculoskeletal Back pain?: Yes Joint pain?: No  Neurological Headaches?: No Dizziness?: No  Psychologic Depression?: No Anxiety?: No  Physical Exam: BP 105/66 (BP Location: Left Arm, Patient Position: Sitting, Cuff Size:  Large)   Pulse 87   Ht 5\' 6"  (1.676 m)   Wt 282 lb 11.2 oz (128.2 kg)   BMI 45.63 kg/m   Constitutional:  Alert and oriented, No acute distress.  Laboratory Data: Lab Results  Component Value Date   WBC 5.6 01/05/2017   HGB 10.9 (L) 01/05/2017   HCT 33.5 (L) 01/05/2017   MCV 62.0 (L) 01/05/2017   PLT 156 01/05/2017    Lab Results  Component Value Date   CREATININE 0.97 01/05/2017    Lab Results  Component Value Date   PSA 3.31 10/28/2016   PSA 3.2 05/16/2014    No results found for: TESTOSTERONE  Lab Results  Component Value Date   HGBA1C 5.9 02/28/2017    Urinalysis    Component Value Date/Time   COLORURINE YELLOW 01/06/2017 1120   APPEARANCEUR Clear 07/11/2017 0833   LABSPEC 1.015 01/06/2017 1120   LABSPEC 1.006 08/11/2014 1230   PHURINE 7.0 01/06/2017 1120   GLUCOSEU Negative 07/11/2017 0833   GLUCOSEU Negative 08/11/2014 1230   HGBUR NEGATIVE 01/06/2017 1120   BILIRUBINUR Negative 07/11/2017 0833   BILIRUBINUR Negative 08/11/2014 1230   KETONESUR NEGATIVE 01/06/2017 1120   PROTEINUR Negative 07/11/2017 0833   PROTEINUR NEGATIVE 01/06/2017 1120   NITRITE Negative 07/11/2017 0833    NITRITE NEGATIVE 01/06/2017 1120   LEUKOCYTESUR Negative 07/11/2017 0833   LEUKOCYTESUR Negative 08/11/2014 1230    Pertinent Imaging: As above  Assessment & Plan:  The patient will be treated as an epididymitis. I gave him mobic 15 mg a day Reviewing side effects. He will also call his primary care physician and perhaps get a referral to an orthopedic or back specialist. The local tenderness pointed towards left epididymitis but also his symptoms were quite high in the back and left side.  1. Epididymitis, left  - Urinalysis, Complete   No Follow-up on file.  Reece Packer, MD  [facility] [address], [place], [place] [address] [phone]

## 2017-07-24 NOTE — Addendum Note (Signed)
Addended by: Toniann Fail C on: 07/24/2017 01:54 PM   Modules accepted: Orders

## 2017-08-01 ENCOUNTER — Ambulatory Visit: Payer: Self-pay | Admitting: Urology

## 2017-08-07 ENCOUNTER — Telehealth: Payer: Self-pay | Admitting: Family Medicine

## 2017-08-07 NOTE — Telephone Encounter (Signed)
Jahlen has been feeling bad, sweating more than usual, some dizziness, checked blood sugar and it was 259 two hours after eating.    He is not on any medication for DM.   Wants to be seen in the morning if possible.  What time can we work him in??    [place]

## 2017-08-07 NOTE — Telephone Encounter (Signed)
9:15 or10:15

## 2017-08-09 ENCOUNTER — Encounter: Payer: Self-pay | Admitting: Family Medicine

## 2017-08-09 ENCOUNTER — Ambulatory Visit (INDEPENDENT_AMBULATORY_CARE_PROVIDER_SITE_OTHER): Payer: BLUE CROSS/BLUE SHIELD | Admitting: Family Medicine

## 2017-08-09 VITALS — BP 120/60 | HR 80 | Temp 98.2°F | Resp 16 | Wt 283.0 lb

## 2017-08-09 DIAGNOSIS — R739 Hyperglycemia, unspecified: Secondary | ICD-10-CM

## 2017-08-09 DIAGNOSIS — E7800 Pure hypercholesterolemia, unspecified: Secondary | ICD-10-CM | POA: Diagnosis not present

## 2017-08-09 DIAGNOSIS — J3089 Other allergic rhinitis: Secondary | ICD-10-CM | POA: Diagnosis not present

## 2017-08-09 DIAGNOSIS — R5383 Other fatigue: Secondary | ICD-10-CM

## 2017-08-09 LAB — POCT GLYCOSYLATED HEMOGLOBIN (HGB A1C): Hemoglobin A1C: 5.6

## 2017-08-09 MED ORDER — PREDNISONE 10 MG (21) PO TBPK: ORAL_TABLET | ORAL | 0 refills | Status: DC

## 2017-08-09 NOTE — Progress Notes (Signed)
Patient: Justin Soto Male    DOB: Sep 02, 1961   56 y.o.   MRN: [id number] Visit Date: 08/09/2017  Today's Provider: [name], MD   Chief Complaint  Patient presents with  . Hyperglycemia  . Fatigue   Subjective:    HPI  Pt is here today for elevated blood sugar. He reports that he had been feeling bad, sweating more than usual, some dizziness and he checked his blood sugar and it was 259 2 hours after eating. He reports that yesterday he checked his blood sugar after breakfast and it was 139. After lunch yesterday it was 208 then last night it was 195 and this morning it was around 208. Pt reports that he just does not feel good. He has pressure sensation in his head that he thought was allergies at but started taking Sudafed and it did not help so he started checking his blood sugar. His last A1c was 5.9 on 02/28/17.      Allergies  Allergen Reactions  . Methocarbamol Other (See Comments)    Numbness, and change of taste   . Tizanidine Nausea Only     Current Outpatient Prescriptions:  .  amLODipine-benazepril (LOTREL) 10-40 MG capsule, Take 1 capsule by mouth daily., Disp: 90 capsule, Rfl: 3 .  aspirin EC 81 MG tablet, Take 81 mg by mouth daily., Disp: , Rfl:  .  cetirizine (ZYRTEC) 10 MG tablet, 1 to 2 tablets daily as needed, Disp: 60 tablet, Rfl: 12 .  finasteride (PROSCAR) 5 MG tablet, Take 1 tablet (5 mg total) by mouth daily., Disp: 90 tablet, Rfl: 4 .  fluticasone (FLONASE) 50 MCG/ACT nasal spray, Place 2 sprays into both nostrils daily., Disp: 16 g, Rfl: 12 .  hydrochlorothiazide (HYDRODIURIL) 25 MG tablet, Take 1 tablet (25 mg total) by mouth daily., Disp: 90 tablet, Rfl: 1 .  meloxicam (MOBIC) 15 MG tablet, Take 1 tablet (15 mg total) by mouth daily., Disp: 30 tablet, Rfl: 0 .  metoprolol tartrate (LOPRESSOR) 25 MG tablet, Take 1 tablet (25 mg total) by mouth 2 (two) times daily., Disp: 180 tablet, Rfl: 3 .  montelukast (SINGULAIR) 10 MG tablet, Take  1 tablet (10 mg total) by mouth every morning., Disp: 90 tablet, Rfl: 1 .  Multiple Vitamin (MULTIVITAMIN WITH MINERALS) TABS tablet, Take 1 tablet by mouth daily., Disp: , Rfl:  .  omeprazole (PRILOSEC) 10 MG capsule, Take 10 mg by mouth daily., Disp: , Rfl:  .  sulfamethoxazole-trimethoprim (BACTRIM DS,SEPTRA DS) 800-160 MG tablet, Take 1 tablet by mouth every 12 (twelve) hours., Disp: 28 tablet, Rfl: 0 .  tamsulosin (FLOMAX) 0.4 MG CAPS capsule, TAKE 2 CAPSULES BY MOUTH EVERY DAY, Disp: 60 capsule, Rfl: 6  Review of Systems  Constitutional: Positive for diaphoresis and fatigue.  HENT: Negative.   Eyes: Negative.   Respiratory: Negative.   Cardiovascular: Negative.   Gastrointestinal: Negative.   Endocrine: Negative.   Genitourinary: Negative for difficulty urinating.  Musculoskeletal: Negative.   Skin: Negative.   Neurological: Positive for dizziness.  Hematological: Negative.   Psychiatric/Behavioral: Negative.     Social History  Substance Use Topics  . Smoking status: Never Smoker  . Smokeless tobacco: Never Used  . Alcohol use No   Objective:   BP 120/60 (BP Location: Left Arm, Patient Position: Sitting, Cuff Size: Large)   Pulse 80   Temp 98.2 F (36.8 C) (Oral)   Resp 16   Wt 283 lb (128.4 kg)  BMI 45.68 kg/m  Vitals:   08/09/17 1618  BP: 120/60  Pulse: 80  Resp: 16  Temp: 98.2 F (36.8 C)  TempSrc: Oral  Weight: 283 lb (128.4 kg)     Physical Exam  Constitutional: He is oriented to person, place, and time. He appears well-developed and well-nourished.  HENT:  Head: Normocephalic and atraumatic.  Right Ear: External ear normal.  Left Ear: External ear normal.  Nose: Nose normal.  Mouth/Throat: Oropharynx is clear and moist.  Eyes: Pupils are equal, round, and reactive to light. Conjunctivae and EOM are normal.  No nystagmus.   Neck: Normal range of motion. Neck supple.  Cardiovascular: Normal rate, regular rhythm, normal heart sounds and intact  distal pulses.   Pulmonary/Chest: Effort normal and breath sounds normal.  Musculoskeletal: Normal range of motion.  Neurological: He is alert and oriented to person, place, and time. He has normal reflexes.  Skin: Skin is warm and dry.  Psychiatric: He has a normal mood and affect. His behavior is normal. Judgment and thought content normal.        Assessment & Plan:     1. Hyperglycemia  - POCT HgB A1C - Comprehensive metabolic panel  2. Other fatigue  - CBC with Differential/Platelet - TSH  3. Pure hypercholesterolemia  - Lipid Panel With LDL/HDL Ratio  4. Allergic rhinitis due to other allergic trigger, unspecified seasonality  - predniSONE (STERAPRED UNI-PAK 21 TAB) 10 MG (21) TBPK tablet; Take 6 on the first day then decrease by one until finished.  Dispense: 21 tablet; Refill: 0      HPI, Exam, and A&P Transcribed under the direction and in the presence of Angellica Maddison L. Cranford Mon, MD  Electronically Signed: Katina Dung, [place], MD  [facility]

## 2017-08-10 DIAGNOSIS — E7800 Pure hypercholesterolemia, unspecified: Secondary | ICD-10-CM | POA: Diagnosis not present

## 2017-08-10 DIAGNOSIS — R5383 Other fatigue: Secondary | ICD-10-CM | POA: Diagnosis not present

## 2017-08-10 DIAGNOSIS — R739 Hyperglycemia, unspecified: Secondary | ICD-10-CM | POA: Diagnosis not present

## 2017-08-11 LAB — COMPREHENSIVE METABOLIC PANEL
ALT: 28 IU/L (ref 0–44)
AST: 30 IU/L (ref 0–40)
Albumin/Globulin Ratio: 1.8 (ref 1.2–2.2)
Albumin: 4.7 g/dL (ref 3.5–5.5)
Alkaline Phosphatase: 50 IU/L (ref 39–117)
BUN/Creatinine Ratio: 14 (ref 9–20)
BUN: 14 mg/dL (ref 6–24)
Bilirubin Total: 0.6 mg/dL (ref 0.0–1.2)
CO2: 23 mmol/L (ref 20–29)
Calcium: 8.8 mg/dL (ref 8.7–10.2)
Chloride: 100 mmol/L (ref 96–106)
Creatinine, Ser: 0.99 mg/dL (ref 0.76–1.27)
GFR calc Af Amer: 98 mL/min/{1.73_m2} (ref >59)
GFR calc non Af Amer: 85 mL/min/{1.73_m2} (ref >59)
Globulin, Total: 2.6 g/dL (ref 1.5–4.5)
Glucose: 110 mg/dL — ABNORMAL HIGH (ref 65–99)
Potassium: 3.9 mmol/L (ref 3.5–5.2)
Sodium: 139 mmol/L (ref 134–144)
Total Protein: 7.3 g/dL (ref 6.0–8.5)

## 2017-08-11 LAB — CBC WITH DIFFERENTIAL/PLATELET
Basophils Absolute: 0 10*3/uL (ref 0.0–0.2)
Basos: 1 %
EOS (ABSOLUTE): 0.2 10*3/uL (ref 0.0–0.4)
Eos: 5 %
Hematocrit: 34.1 % — ABNORMAL LOW (ref 37.5–51.0)
Hemoglobin: 10.6 g/dL — ABNORMAL LOW (ref 13.0–17.7)
Immature Grans (Abs): 0 10*3/uL (ref 0.0–0.1)
Immature Granulocytes: 0 %
Lymphocytes Absolute: 1.3 10*3/uL (ref 0.7–3.1)
Lymphs: 28 %
MCH: 19.3 pg — ABNORMAL LOW (ref 26.6–33.0)
MCHC: 31.1 g/dL — ABNORMAL LOW (ref 31.5–35.7)
MCV: 62 fL — ABNORMAL LOW (ref 79–97)
Monocytes Absolute: 0.7 10*3/uL (ref 0.1–0.9)
Monocytes: 15 %
Neutrophils Absolute: 2.3 10*3/uL (ref 1.4–7.0)
Neutrophils: 51 %
Platelets: 239 10*3/uL (ref 150–379)
RBC: 5.49 x10E6/uL (ref 4.14–5.80)
RDW: 16.6 % — ABNORMAL HIGH (ref 12.3–15.4)
WBC: 4.5 10*3/uL (ref 3.4–10.8)

## 2017-08-11 LAB — LIPID PANEL WITH LDL/HDL RATIO
Cholesterol, Total: 117 mg/dL (ref 100–199)
HDL: 26 mg/dL — ABNORMAL LOW (ref >39)
LDL Calculated: 44 mg/dL (ref 0–99)
LDl/HDL Ratio: 1.7 ratio (ref 0.0–3.6)
Triglycerides: 233 mg/dL — ABNORMAL HIGH (ref 0–149)
VLDL Cholesterol Cal: 47 mg/dL — ABNORMAL HIGH (ref 5–40)

## 2017-08-11 LAB — TSH: TSH: 1.1 u[IU]/mL (ref 0.450–4.500)

## 2017-08-23 ENCOUNTER — Ambulatory Visit: Payer: Self-pay | Admitting: Family Medicine

## 2017-08-23 ENCOUNTER — Ambulatory Visit: Payer: BLUE CROSS/BLUE SHIELD

## 2017-08-24 ENCOUNTER — Ambulatory Visit: Payer: Self-pay | Admitting: Family Medicine

## 2017-09-09 ENCOUNTER — Ambulatory Visit (INDEPENDENT_AMBULATORY_CARE_PROVIDER_SITE_OTHER): Payer: BLUE CROSS/BLUE SHIELD | Admitting: Physician Assistant

## 2017-09-09 ENCOUNTER — Encounter: Payer: Self-pay | Admitting: Physician Assistant

## 2017-09-09 VITALS — BP 120/70 | HR 87 | Temp 99.3°F | Resp 16 | Wt 281.0 lb

## 2017-09-09 DIAGNOSIS — J0140 Acute pansinusitis, unspecified: Secondary | ICD-10-CM | POA: Diagnosis not present

## 2017-09-09 MED ORDER — AMOXICILLIN-POT CLAVULANATE 875-125 MG PO TABS: 1.0000 | ORAL_TABLET | Freq: Two times a day (BID) | ORAL | 0 refills | Status: DC

## 2017-09-09 NOTE — Patient Instructions (Signed)

## 2017-09-09 NOTE — Progress Notes (Signed)
Patient: Justin Soto Male    DOB: 11/04/1961   56 y.o.   MRN: [id number] Visit Date: 09/09/2017  Today's Provider: Mar Daring, PA-C   Chief Complaint  Patient presents with  . Cough   Subjective:    Patient has had cough, ear pain, congestion and body aches for 2 days. Also has symptoms of headaches, sob and wheezing. Patient stated that he di take hydrocodone cough syrup this morning for cough.    Cough  This is a new problem. The current episode started in the past 7 days (2 days). The problem has been gradually worsening. The cough is productive of sputum. Associated symptoms include chills, ear congestion, ear pain, headaches, myalgias, nasal congestion, postnasal drip, shortness of breath and wheezing. Pertinent negatives include no chest pain, fever, heartburn, hemoptysis, rash, rhinorrhea, sore throat, sweats or weight loss. Nothing aggravates the symptoms. He has tried prescription cough suppressant (Hydrocodone ) for the symptoms. The treatment provided mild relief. His past medical history is significant for environmental allergies. There is no history of asthma, bronchiectasis, bronchitis, COPD, emphysema or pneumonia.      Allergies  Allergen Reactions  . Methocarbamol Other (See Comments)    Numbness, and change of taste   . Tizanidine Nausea Only     Current Outpatient Prescriptions:  .  amLODipine-benazepril (LOTREL) 10-40 MG capsule, Take 1 capsule by mouth daily., Disp: 90 capsule, Rfl: 3 .  aspirin EC 81 MG tablet, Take 81 mg by mouth daily., Disp: , Rfl:  .  cetirizine (ZYRTEC) 10 MG tablet, 1 to 2 tablets daily as needed, Disp: 60 tablet, Rfl: 12 .  finasteride (PROSCAR) 5 MG tablet, Take 1 tablet (5 mg total) by mouth daily., Disp: 90 tablet, Rfl: 4 .  fluticasone (FLONASE) 50 MCG/ACT nasal spray, Place 2 sprays into both nostrils daily., Disp: 16 g, Rfl: 12 .  hydrochlorothiazide (HYDRODIURIL) 25 MG tablet, Take 1 tablet (25 mg total) by  mouth daily., Disp: 90 tablet, Rfl: 1 .  meloxicam (MOBIC) 15 MG tablet, Take 1 tablet (15 mg total) by mouth daily., Disp: 30 tablet, Rfl: 0 .  metoprolol tartrate (LOPRESSOR) 25 MG tablet, Take 1 tablet (25 mg total) by mouth 2 (two) times daily., Disp: 180 tablet, Rfl: 3 .  montelukast (SINGULAIR) 10 MG tablet, Take 1 tablet (10 mg total) by mouth every morning., Disp: 90 tablet, Rfl: 1 .  Multiple Vitamin (MULTIVITAMIN WITH MINERALS) TABS tablet, Take 1 tablet by mouth daily., Disp: , Rfl:  .  omeprazole (PRILOSEC) 10 MG capsule, Take 10 mg by mouth daily., Disp: , Rfl:  .  tamsulosin (FLOMAX) 0.4 MG CAPS capsule, TAKE 2 CAPSULES BY MOUTH EVERY DAY, Disp: 60 capsule, Rfl: 6  Review of Systems  Constitutional: Positive for chills. Negative for appetite change, fever and weight loss.  HENT: Positive for congestion, ear pain and postnasal drip. Negative for rhinorrhea and sore throat.   Respiratory: Positive for cough, shortness of breath and wheezing. Negative for hemoptysis and chest tightness.   Cardiovascular: Negative for chest pain and palpitations.  Gastrointestinal: Negative for abdominal pain, heartburn, nausea and vomiting.  Musculoskeletal: Positive for myalgias.  Skin: Negative for rash.  Allergic/Immunologic: Positive for environmental allergies.  Neurological: Positive for headaches.    Social History  Substance Use Topics  . Smoking status: Never Smoker  . Smokeless tobacco: Never Used  . Alcohol use No   Objective:   BP 120/70 (BP Location: Right Arm, Patient  Position: Sitting, Cuff Size: Large)   Pulse 87   Temp 99.3 F (37.4 C) (Oral)   Resp 16   Wt 281 lb (127.5 kg)   SpO2 96%   BMI 45.35 kg/m  Vitals:   09/09/17 0924  BP: 120/70  Pulse: 87  Resp: 16  Temp: 99.3 F (37.4 C)  TempSrc: Oral  SpO2: 96%  Weight: 281 lb (127.5 kg)     Physical Exam  Constitutional: He appears well-developed and well-nourished. No distress.  HENT:  Head: Normocephalic  and atraumatic.  Right Ear: Hearing, external ear and ear canal normal. Tympanic membrane is not erythematous and not bulging. A middle ear effusion is present.  Left Ear: Hearing, external ear and ear canal normal. Tympanic membrane is not erythematous and not bulging. A middle ear effusion is present.  Nose: Mucosal edema and rhinorrhea present. Right sinus exhibits maxillary sinus tenderness. Right sinus exhibits no frontal sinus tenderness. Left sinus exhibits maxillary sinus tenderness. Left sinus exhibits no frontal sinus tenderness.  Mouth/Throat: Uvula is midline, oropharynx is clear and moist and mucous membranes are normal. No oropharyngeal exudate, posterior oropharyngeal edema or posterior oropharyngeal erythema.  Eyes: Pupils are equal, round, and reactive to light. Conjunctivae and EOM are normal. Right eye exhibits no discharge. Left eye exhibits no discharge.  Neck: Normal range of motion. Neck supple. No tracheal deviation present. No Brudzinski's sign and no Kernig's sign noted. No thyromegaly present.  Cardiovascular: Normal rate, regular rhythm and normal heart sounds.  Exam reveals no gallop and no friction rub.   No murmur heard. Pulmonary/Chest: Effort normal and breath sounds normal. No stridor. No respiratory distress. He has no wheezes. He has no rales.  Lymphadenopathy:    He has no cervical adenopathy.  Skin: Skin is warm and dry. He is not diaphoretic.  Vitals reviewed.       Assessment & Plan:     1. Acute pansinusitis, recurrence not specified Worsening symptoms that have not responded to OTC medications. Will give augmentin as below. Continue allergy medications. Stay well hydrated and get plenty of rest. Call if no symptom improvement or if symptoms worsen. - amoxicillin-clavulanate (AUGMENTIN) 875-125 MG tablet; Take 1 tablet by mouth 2 (two) times daily.  Dispense: 20 tablet; Refill: 0       Mar Daring, PA-C  [facility]

## 2017-09-15 DIAGNOSIS — Z01812 Encounter for preprocedural laboratory examination: Secondary | ICD-10-CM | POA: Diagnosis not present

## 2017-09-25 ENCOUNTER — Other Ambulatory Visit: Payer: Self-pay | Admitting: Family Medicine

## 2017-09-25 MED ORDER — CETIRIZINE HCL 10 MG PO TABS: ORAL_TABLET | ORAL | 12 refills | Status: DC

## 2017-09-25 NOTE — Telephone Encounter (Signed)
Pt contacted office for refill request on the following medications:  cetirizine (ZYRTEC) 10 MG tablet.  Take one to two tablets by mouth every day as needed.  [facility]  This is a Dr Rosanna Randy patient

## 2017-09-25 NOTE — Addendum Note (Signed)
Addended by: Kris Mouton on: 09/25/2017 10:38 AM   Modules accepted: Orders

## 2017-10-23 ENCOUNTER — Other Ambulatory Visit: Payer: Self-pay | Admitting: Urology

## 2017-10-23 DIAGNOSIS — R351 Nocturia: Secondary | ICD-10-CM

## 2017-11-09 ENCOUNTER — Encounter: Payer: Self-pay | Admitting: Urology

## 2017-11-09 ENCOUNTER — Ambulatory Visit: Payer: BLUE CROSS/BLUE SHIELD | Admitting: Urology

## 2017-11-09 VITALS — BP 146/97 | HR 87 | Ht 66.0 in | Wt 280.4 lb

## 2017-11-09 DIAGNOSIS — B3742 Candidal balanitis: Secondary | ICD-10-CM

## 2017-11-09 DIAGNOSIS — N475 Adhesions of prepuce and glans penis: Secondary | ICD-10-CM | POA: Diagnosis not present

## 2017-11-09 LAB — URINALYSIS, COMPLETE
Bilirubin, UA: NEGATIVE
Glucose, UA: NEGATIVE
Ketones, UA: NEGATIVE
Leukocytes, UA: NEGATIVE
Nitrite, UA: NEGATIVE
Protein, UA: NEGATIVE
RBC, UA: NEGATIVE
Specific Gravity, UA: 1.025 (ref 1.005–1.030)
Urobilinogen, Ur: 0.2 mg/dL (ref 0.2–1.0)
pH, UA: 6 (ref 5.0–7.5)

## 2017-11-09 MED ORDER — NYSTATIN-TRIAMCINOLONE 100000-0.1 UNIT/GM-% EX OINT: 1.0000 "application " | TOPICAL_OINTMENT | Freq: Two times a day (BID) | CUTANEOUS | 0 refills | Status: DC

## 2017-11-09 NOTE — Progress Notes (Signed)
11/09/2017 9:04 AM   Justin Soto 1961/10/21 [id number]  Referring provider: Jerrol Banana., MD [address] [place], [place] [address]  Chief Complaint  Patient presents with  . Insect Bite    HPI: Patient is a 56 year old Caucasian male who presents today requesting an urgent appointment for left testicular pain and frequency of urination.  He states that one week ago in the morning he felt a pinching sensation on the left side of his penis.  He was camping and rolled over and felt the pinching sensation.  He did not see an insect or animal in the tent.  He describes the pain as a burning sensation.  The area started to become red with blisters.  He then started to experience frequency and dysuria.  He has not had fevers, chills, nausea or vomiting.  He has not had gross hematuria or suprapubic pain.  He has not seen any drainage from the area.  He was applying Neosporin to the area.  The pain is becoming worse and he had to take a left over Percocet last night.  Nothing seems to help the pain or make it worse.    His UA was negative.   PMH: Past Medical History:  Diagnosis Date  . Anemia   . Enlarged prostate    hx of  . Environmental and seasonal allergies   . Frequency of urination   . GERD (gastroesophageal reflux disease)   . History of kidney stones   . HOH (hard of hearing)    wears bilateral hearing aids  . Hypertension   . Irregular heart beat    Pt feels this every once in a while; PCP aware but stated it was OK  . Kidney stone on right side 01/15/2017  . Leg pain, right    Take Lyrica and Naproxen  . Neck pain of over 3 months duration   . Sleep apnea    NO CPAP  . Tick fever     Surgical History: Past Surgical History:  Procedure Laterality Date  . ANTERIOR CERVICAL DECOMP/DISCECTOMY FUSION N/A 11/13/2014   Procedure: Cervical three-four, Cervical six-seven anterior cervical decompression with fusion interbody prosthesis plating and  bonegraft;  Surgeon: Newman Pies, MD;  Location: [facility];  Service: Neurosurgery;  Laterality: N/A;  Cervical three-four, Cervical six-seven anterior cervical decompression with fusion interbody prosthesis plating and bonegraft  . BACK SURGERY     Lumbar X 2  . CALCANEAL OSTEOTOMY Right 01/11/2017   Procedure: CALCANEAL OSTEOTOMY;  Surgeon: Earnestine Leys, MD;  Location: [facility];  Service: Orthopedics;  Laterality: Right;  . COLONOSCOPY    . HERNIA REPAIR Left    groin  . LEG SURGERY Right    Had surgery on right calf X 5  . LITHOTRIPSY    . QUADRICEPS TENDON REPAIR Left 06/22/2015   Procedure: REPAIR QUADRICEP TENDON;  Surgeon: Earnestine Leys, MD;  Location: [facility];  Service: Orthopedics;  Laterality: Left;  Marland Kitchen QUADRICEPS TENDON REPAIR Left 08/12/2015   Procedure: REPAIR QUADRICEP TENDON;  Surgeon: Earnestine Leys, MD;  Location: [facility];  Service: Orthopedics;  Laterality: Left;  . ROTATOR CUFF REPAIR Left   . TONSILLECTOMY    . VASECTOMY     with hydrocele repaired    Home Medications:  Allergies as of 11/09/2017      Reactions   Methocarbamol Other (See Comments)   Numbness, and change of taste    Tizanidine Nausea Only  Medication List        Accurate as of 11/09/17  9:04 AM. Always use your most recent med list.          amLODipine-benazepril 10-40 MG capsule Commonly known as:  LOTREL Take 1 capsule by mouth daily.   aspirin EC 81 MG tablet Take 81 mg by mouth daily.   cetirizine 10 MG tablet Commonly known as:  ZYRTEC 1 to 2 tablets daily as needed   finasteride 5 MG tablet Commonly known as:  PROSCAR Take 1 tablet (5 mg total) by mouth daily.   fluticasone 50 MCG/ACT nasal spray Commonly known as:  FLONASE Place 2 sprays into both nostrils daily.   hydrochlorothiazide 25 MG tablet Commonly known as:  HYDRODIURIL Take 1 tablet (25 mg total) by mouth daily.   meloxicam 15 MG tablet Commonly known as:  MOBIC Take 1 tablet (15 mg total) by mouth  daily.   metoprolol tartrate 25 MG tablet Commonly known as:  LOPRESSOR Take 1 tablet (25 mg total) by mouth 2 (two) times daily.   montelukast 10 MG tablet Commonly known as:  SINGULAIR Take 1 tablet (10 mg total) by mouth every morning.   multivitamin with minerals Tabs tablet Take 1 tablet by mouth daily.   nystatin-triamcinolone ointment Commonly known as:  MYCOLOG Apply 1 application topically 2 (two) times daily.   omeprazole 10 MG capsule Commonly known as:  PRILOSEC Take 10 mg by mouth daily.   tamsulosin 0.4 MG Caps capsule Commonly known as:  FLOMAX TAKE TWO CAPSULES BY MOUTH EACH DAY AS DIRECTED       Allergies:  Allergies  Allergen Reactions  . Methocarbamol Other (See Comments)    Numbness, and change of taste   . Tizanidine Nausea Only    Family History: Family History  Problem Relation Age of Onset  . Prostate cancer Father   . Heart disease Father   . Diabetes Father   . Alzheimer's disease Father   . Anemia Mother   . Osteoporosis Mother   . Diabetes Brother   . Hypertension Brother   . Anemia Daughter   . Kidney disease Neg Hx   . Bladder Cancer Neg Hx     Social History:  reports that  has never smoked. he has never used smokeless tobacco. He reports that he does not drink alcohol or use drugs.  ROS: UROLOGY Frequent Urination?: Yes Hard to postpone urination?: No Burning/pain with urination?: Yes Get up at night to urinate?: No Leakage of urine?: No Urine stream starts and stops?: No Trouble starting stream?: No Do you have to strain to urinate?: No Blood in urine?: No Urinary tract infection?: No Sexually transmitted disease?: No Injury to kidneys or bladder?: No Painful intercourse?: No Weak stream?: No Erection problems?: No Penile pain?: No  Gastrointestinal Nausea?: No Vomiting?: No Indigestion/heartburn?: No Diarrhea?: No Constipation?: No  Constitutional Fever: No Night sweats?: No Weight loss?:  No Fatigue?: No  Skin Skin rash/lesions?: No Itching?: No  Eyes Blurred vision?: No Double vision?: No  Ears/Nose/Throat Sore throat?: No Sinus problems?: No  Hematologic/Lymphatic Swollen glands?: No Easy bruising?: No  Cardiovascular Leg swelling?: No Chest pain?: No  Respiratory Cough?: No Shortness of breath?: No  Endocrine Excessive thirst?: No  Musculoskeletal Back pain?: No Joint pain?: No  Neurological Headaches?: No Dizziness?: No  Psychologic Depression?: No Anxiety?: No  Physical Exam: BP (!) 146/97   Pulse 87   Ht 5\' 6"  (1.676 m)   Wt 280 lb  6.4 oz (127.2 kg)   BMI 45.26 kg/m   Constitutional: Well nourished. Alert and oriented, No acute distress. HEENT: [place] AT, moist mucus membranes. Trachea midline, no masses. Cardiovascular: No clubbing, cyanosis, or edema. Respiratory: Normal respiratory effort, no increased work of breathing. GI: Obese.  Abdomen is soft, non tender, non distended, no abdominal masses. Liver and spleen not palpable.  No distinct hernias appreciated.  Stool sample for occult testing is not indicated.   GU: No CVA tenderness.  No bladder fullness or masses.  Patient with buried penis.  Glans with erythema and satellite lesions.  There are adhesions to the glands of the foreskin and it appears that one of these areas was torn from the glands.  This may be have been "the bite"  he experienced.  There is a white, thick discharge adherent to the glans.  Scrotum without lesions, cysts, rashes and/or edema.  Testicles are located scrotally bilaterally. No masses are appreciated in the testicles. Left and right epididymis are normal. Rectal: Deferred.   Skin: No rashes, bruises or suspicious lesions. Lymph: No cervical or inguinal adenopathy. Neurologic: Grossly intact, no focal deficits, moving all 4 extremities. Psychiatric: Normal mood and affect.  Laboratory Data: Lab Results  Component Value Date   WBC 4.5 08/10/2017   HGB  10.6 (L) 08/10/2017   HCT 34.1 (L) 08/10/2017   MCV 62 (L) 08/10/2017   PLT 239 08/10/2017    Lab Results  Component Value Date   CREATININE 0.99 08/10/2017    Lab Results  Component Value Date   PSA 3.31 10/28/2016   PSA 3.2 05/16/2014       PSA                        3.2                                                                   09/09/2015  Lab Results  Component Value Date   HGBA1C 5.6 08/09/2017     Lab Results  Component Value Date   AST 30 08/10/2017   Lab Results  Component Value Date   ALT 28 08/10/2017   Urinalysis Negative.  See EPIC.  I have reviewed the labs   Assessment & Plan:    1.  Yeast balanitis  - patient prescribed Mycolog cream to apply to the area twice daily  - He is instructed to keep the area clean and dry  - He is to use acetaminophen or ibuprofen for pain  -He is to return in 2 weeks for a symptom recheck  2.  Penile adhesions  -Advised the patient that he may want to consider circumcision in the future    Return in about 2 weeks (around 11/23/2017) for recheck.  These notes generated with voice recognition software. I apologize for typographical errors.  Zara Council, [facility] [address], [place], [place] [address] [phone]

## 2017-11-22 NOTE — Progress Notes (Signed)
11/23/2017 9:27 AM   Justin Soto 1961-02-13 [id number]  Referring provider: Jerrol Banana., MD [address] [place], [place] [address]  Chief Complaint  Patient presents with  . Benign Prostatic Hypertrophy    HPI: Patient is a 56 year old Caucasian male who presents today for a two week follow up appointment.    At his visit on 11/09/2017, he stated that one week ago in the morning he felt a pinching sensation on the left side of his penis.  He was camping and rolled over and felt the pinching sensation.  He did not see an insect or animal in the tent.  He describes the pain as a burning sensation.  The area started to become red with blisters.  He then started to experience frequency and dysuria.  He has not had fevers, chills, nausea or vomiting.  He has not had gross hematuria or suprapubic pain.  He has not seen any drainage from the area.  He was applying Neosporin to the area.  The pain is becoming worse and he had to take a left over Percocet last night.  Nothing seems to help the pain or make it worse.   His UA was negative.  On exam, he was found to have adhesions and likely tore one of the adhesions from the glans.  Balanitis was also present.  He was given Mycolog II cream to apply bid.    BPH WITH LUTS  (prostate and/or bladder) His IPSS score today is 17, which is moderate lower urinary tract symptomatology. He is unhappy with his quality life due to his urinary symptoms.  His PVR is 0 mL.  His previous IPSS score was 17/5.   His previous PVR is 139 mL.    His major complaints today are frequency and dysuria.  He has had the frequency symptoms for the last two years.  The dysuria started with the balanitis.  He denies any dysuria, hematuria or suprapubic pain.   He currently taking tamsulosin 0.4 mg two at a time and finasteride 5 mg daily.     He also denies any recent fevers, chills, nausea or vomiting.  His UA is negative.    Father with prostate  cancer.    IPSS    Row Name 11/23/17 0800         International Prostate Symptom Score   How often have you had the sensation of not emptying your bladder?  More than half the time     How often have you had to urinate less than every two hours?  Almost always     How often have you found you stopped and started again several times when you urinated?  Less than half the time     How often have you found it difficult to postpone urination?  Less than 1 in 5 times     How often have you had a weak urinary stream?  Less than 1 in 5 times     How often have you had to strain to start urination?  Not at All     How many times did you typically get up at night to urinate?  4 Times     Total IPSS Score  17       Quality of Life due to urinary symptoms   If you were to spend the rest of your life with your urinary condition just the way it is now how would you feel about that?  Unhappy        Score:  1-7 Mild 8-19 Moderate 20-35 Severe   PMH: Past Medical History:  Diagnosis Date  . Anemia   . Enlarged prostate    hx of  . Environmental and seasonal allergies   . Frequency of urination   . GERD (gastroesophageal reflux disease)   . History of kidney stones   . HOH (hard of hearing)    wears bilateral hearing aids  . Hypertension   . Irregular heart beat    Pt feels this every once in a while; PCP aware but stated it was OK  . Kidney stone on right side 01/15/2017  . Leg pain, right    Take Lyrica and Naproxen  . Neck pain of over 3 months duration   . Sleep apnea    NO CPAP  . Tick fever     Surgical History: Past Surgical History:  Procedure Laterality Date  . ANTERIOR CERVICAL DECOMP/DISCECTOMY FUSION N/A 11/13/2014   Procedure: Cervical three-four, Cervical six-seven anterior cervical decompression with fusion interbody prosthesis plating and bonegraft;  Surgeon: Newman Pies, MD;  Location: [facility];  Service: Neurosurgery;  Laterality: N/A;  Cervical three-four,  Cervical six-seven anterior cervical decompression with fusion interbody prosthesis plating and bonegraft  . BACK SURGERY     Lumbar X 2  . CALCANEAL OSTEOTOMY Right 01/11/2017   Procedure: CALCANEAL OSTEOTOMY;  Surgeon: Earnestine Leys, MD;  Location: [facility];  Service: Orthopedics;  Laterality: Right;  . COLONOSCOPY    . HERNIA REPAIR Left    groin  . LEG SURGERY Right    Had surgery on right calf X 5  . LITHOTRIPSY    . QUADRICEPS TENDON REPAIR Left 06/22/2015   Procedure: REPAIR QUADRICEP TENDON;  Surgeon: Earnestine Leys, MD;  Location: [facility];  Service: Orthopedics;  Laterality: Left;  Marland Kitchen QUADRICEPS TENDON REPAIR Left 08/12/2015   Procedure: REPAIR QUADRICEP TENDON;  Surgeon: Earnestine Leys, MD;  Location: [facility];  Service: Orthopedics;  Laterality: Left;  . ROTATOR CUFF REPAIR Left   . TONSILLECTOMY    . VASECTOMY     with hydrocele repaired    Home Medications:  Allergies as of 11/23/2017      Reactions   Methocarbamol Other (See Comments)   Numbness, and change of taste    Tizanidine Nausea Only      Medication List        Accurate as of 11/23/17  9:27 AM. Always use your most recent med list.          amLODipine-benazepril 10-40 MG capsule Commonly known as:  LOTREL Take 1 capsule by mouth daily.   aspirin EC 81 MG tablet Take 81 mg by mouth daily.   cetirizine 10 MG tablet Commonly known as:  ZYRTEC 1 to 2 tablets daily as needed   finasteride 5 MG tablet Commonly known as:  PROSCAR Take 1 tablet (5 mg total) by mouth daily.   fluconazole 100 MG tablet Commonly known as:  DIFLUCAN Take 1 tablet (100 mg total) by mouth daily. X 7 days   fluticasone 50 MCG/ACT nasal spray Commonly known as:  FLONASE Place 2 sprays into both nostrils daily.   hydrochlorothiazide 25 MG tablet Commonly known as:  HYDRODIURIL Take 1 tablet (25 mg total) by mouth daily.   meloxicam 15 MG tablet Commonly known as:  MOBIC Take 1 tablet (15 mg total) by mouth daily.     metoprolol tartrate 25 MG tablet Commonly known as:  LOPRESSOR  Take 1 tablet (25 mg total) by mouth 2 (two) times daily.   montelukast 10 MG tablet Commonly known as:  SINGULAIR Take 1 tablet (10 mg total) by mouth every morning.   multivitamin with minerals Tabs tablet Take 1 tablet by mouth daily.   nystatin-triamcinolone ointment Commonly known as:  MYCOLOG Apply 1 application topically 2 (two) times daily.   omeprazole 10 MG capsule Commonly known as:  PRILOSEC Take 10 mg by mouth daily.   oxyCODONE-acetaminophen 10-325 MG tablet Commonly known as:  PERCOCET Take 1 tablet by mouth every 4 (four) hours as needed for pain.   tamsulosin 0.4 MG Caps capsule Commonly known as:  FLOMAX TAKE TWO CAPSULES BY MOUTH EACH DAY AS DIRECTED       Allergies:  Allergies  Allergen Reactions  . Methocarbamol Other (See Comments)    Numbness, and change of taste   . Tizanidine Nausea Only    Family History: Family History  Problem Relation Age of Onset  . Prostate cancer Father   . Heart disease Father   . Diabetes Father   . Alzheimer's disease Father   . Anemia Mother   . Osteoporosis Mother   . Diabetes Brother   . Hypertension Brother   . Anemia Daughter   . Kidney disease Neg Hx   . Bladder Cancer Neg Hx     Social History:  reports that  has never smoked. he has never used smokeless tobacco. He reports that he does not drink alcohol or use drugs.  ROS: UROLOGY Frequent Urination?: Yes Hard to postpone urination?: No Burning/pain with urination?: Yes Get up at night to urinate?: No Leakage of urine?: No Urine stream starts and stops?: No Trouble starting stream?: No Do you have to strain to urinate?: No Blood in urine?: No Urinary tract infection?: No Sexually transmitted disease?: No Injury to kidneys or bladder?: No Painful intercourse?: No Weak stream?: No Erection problems?: No Penile pain?: No  Gastrointestinal Nausea?: No Vomiting?:  No Indigestion/heartburn?: No Diarrhea?: No Constipation?: No  Constitutional Fever: No Night sweats?: No Weight loss?: No Fatigue?: No  Skin Skin rash/lesions?: No Itching?: No  Eyes Blurred vision?: No Double vision?: No  Ears/Nose/Throat Sore throat?: No Sinus problems?: No  Hematologic/Lymphatic Swollen glands?: No Easy bruising?: No  Cardiovascular Leg swelling?: No Chest pain?: No  Respiratory Cough?: No Shortness of breath?: No  Endocrine Excessive thirst?: No  Musculoskeletal Back pain?: No Joint pain?: No  Neurological Headaches?: No Dizziness?: No  Psychologic Depression?: No Anxiety?: No  Physical Exam: BP (!) 151/81 (BP Location: Right Arm, Patient Position: Sitting, Cuff Size: Large)   Pulse 82   Ht 5\' 6"  (1.676 m)   Wt 286 lb (129.7 kg)   BMI 46.16 kg/m   Constitutional: Well nourished. Alert and oriented, No acute distress. HEENT: [place] AT, moist mucus membranes. Trachea midline, no masses. Cardiovascular: No clubbing, cyanosis, or edema. Respiratory: Normal respiratory effort, no increased work of breathing. GI: Obese.  Abdomen is soft, non tender, non distended, no abdominal masses. Liver and spleen not palpable.  No distinct hernias appreciated.  Stool sample for occult testing is not indicated.   GU: No CVA tenderness.  No bladder fullness or masses.  Patient with buried penis.  Glans with erythema and satellite lesions.  There are adhesions to the glands of the foreskin and it appears that one of these areas was torn from the glands.  There appears to be sebum trapped behind the adhesions.  Foreskin is slightly  edematous.  Scrotum without lesions, cysts, rashes and/or edema.  Testicles are located scrotally bilaterally. No masses are appreciated in the testicles. Left and right epididymis are normal. Rectal: Patient with  normal sphincter tone. Anus and perineum without scarring or rashes. No rectal masses are appreciated. Prostate is  approximately 45 grams, no nodules are appreciated. Seminal vesicles are normal. Skin: No rashes, bruises or suspicious lesions. Lymph: No cervical or inguinal adenopathy. Neurologic: Grossly intact, no focal deficits, moving all 4 extremities. Psychiatric: Normal mood and affect.  Laboratory Data: Lab Results  Component Value Date   WBC 4.5 08/10/2017   HGB 10.6 (L) 08/10/2017   HCT 34.1 (L) 08/10/2017   MCV 62 (L) 08/10/2017   PLT 239 08/10/2017    Lab Results  Component Value Date   CREATININE 0.99 08/10/2017    Lab Results  Component Value Date   PSA 3.31 10/28/2016   PSA 3.2 05/16/2014       PSA                        3.2                                                                   09/09/2015  Lab Results  Component Value Date   HGBA1C 5.6 08/09/2017    Lab Results  Component Value Date   AST 30 08/10/2017   Lab Results  Component Value Date   ALT 28 08/10/2017   Urinalysis Negative.  See Epic.   I have reviewed the labs   Assessment & Plan:    1.  Yeast balanitis  - started on diflucan 100 mg qd x 7 days  - will schedule for circumcision at this time   - Explained to the patient on how the procedure is performed and the risks of pain, swelling, bleeding, infection, penile injury or deformity and penile necrosis  - I also explained the risks of general anesthesia, such as: MI, CVA, paralysis, coma and/or death.  2.  Penile adhesions  - patient would like to move forward with circumcision at this time  - given Percocet 10/325, q 4 hours prn for pain, # 10 - checked registry - last narcotic script given in 12/2016  - will need cardiac clearance from Dr. Nehemiah Massed to stop ASA  3. BPH with LUTS  - IPSS score is 17/5, it is stable  - Continue conservative management, avoiding bladder irritants and timed voiding's  - most bothersome symptoms is/are frequency  - on maximal medical therapy with tamsulosin 0.4 mg x 2 and finasteride 5 mg daily still  symptomatic  - Cannot tolerate medication or medication failure, schedule cystoscopy at the time of the circumcision  - I have explained to the patient that they will  be scheduled for a cystoscopy in our office to evaluate their bladder.  The cystoscopy consists of passing a tube with a lens up through their urethra and into their urinary bladder.  After the procedure, they might experience blood in the urine and discomfort with urination.  This will abate after the first few voids.  I have  encouraged the patient to increase water intake  during this time.  Patient denies any allergies to lidocaine.   -  UA  - urine culture  - BMP  - CBC   Return for circumcision and cystoscopy in the OR.  These notes generated with voice recognition software. I apologize for typographical errors.  Zara Council, [facility] [address] [place], [place] [address] [phone]

## 2017-11-22 NOTE — H&P (View-Only) (Signed)
11/23/2017 9:27 AM   Justin Soto 1961-02-15 [id number]  Referring provider: Jerrol Banana., MD [address] [place],  [address]  Chief Complaint  Patient presents with  . Benign Prostatic Hypertrophy    HPI: Patient is a 56 year old Caucasian male who presents today for a two week follow up appointment.    At his visit on 11/09/2017, he stated that one week ago in the morning he felt a pinching sensation on the left side of his penis.  He was camping and rolled over and felt the pinching sensation.  He did not see an insect or animal in the tent.  He describes the pain as a burning sensation.  The area started to become red with blisters.  He then started to experience frequency and dysuria.  He has not had fevers, chills, nausea or vomiting.  He has not had gross hematuria or suprapubic pain.  He has not seen any drainage from the area.  He was applying Neosporin to the area.  The pain is becoming worse and he had to take a left over Percocet last night.  Nothing seems to help the pain or make it worse.   His UA was negative.  On exam, he was found to have adhesions and likely tore one of the adhesions from the glans.  Balanitis was also present.  He was given Mycolog II cream to apply bid.    BPH WITH LUTS  (prostate and/or bladder) His IPSS score today is 17, which is moderate lower urinary tract symptomatology. He is unhappy with his quality life due to his urinary symptoms.  His PVR is 0 mL.  His previous IPSS score was 17/5.   His previous PVR is 139 mL.    His major complaints today are frequency and dysuria.  He has had the frequency symptoms for the last two years.  The dysuria started with the balanitis.  He denies any dysuria, hematuria or suprapubic pain.   He currently taking tamsulosin 0.4 mg two at a time and finasteride 5 mg daily.     He also denies any recent fevers, chills, nausea or vomiting.  His UA is negative.    Father with prostate  cancer.    IPSS    Row Name 11/23/17 0800         International Prostate Symptom Score   How often have you had the sensation of not emptying your bladder?  More than half the time     How often have you had to urinate less than every two hours?  Almost always     How often have you found you stopped and started again several times when you urinated?  Less than half the time     How often have you found it difficult to postpone urination?  Less than 1 in 5 times     How often have you had a weak urinary stream?  Less than 1 in 5 times     How often have you had to strain to start urination?  Not at All     How many times did you typically get up at night to urinate?  4 Times     Total IPSS Score  17       Quality of Life due to urinary symptoms   If you were to spend the rest of your life with your urinary condition just the way it is now how would you feel about that?  Unhappy        Score:  1-7 Mild 8-19 Moderate 20-35 Severe   PMH: Past Medical History:  Diagnosis Date  . Anemia   . Enlarged prostate    hx of  . Environmental and seasonal allergies   . Frequency of urination   . GERD (gastroesophageal reflux disease)   . History of kidney stones   . HOH (hard of hearing)    wears bilateral hearing aids  . Hypertension   . Irregular heart beat    Pt feels this every once in a while; PCP aware but stated it was OK  . Kidney stone on right side 01/15/2017  . Leg pain, right    Take Lyrica and Naproxen  . Neck pain of over 3 months duration   . Sleep apnea    NO CPAP  . Tick fever     Surgical History: Past Surgical History:  Procedure Laterality Date  . ANTERIOR CERVICAL DECOMP/DISCECTOMY FUSION N/A 11/13/2014   Procedure: Cervical three-four, Cervical six-seven anterior cervical decompression with fusion interbody prosthesis plating and bonegraft;  Surgeon: Newman Pies, MD;  Location: [facility];  Service: Neurosurgery;  Laterality: N/A;  Cervical three-four,  Cervical six-seven anterior cervical decompression with fusion interbody prosthesis plating and bonegraft  . BACK SURGERY     Lumbar X 2  . CALCANEAL OSTEOTOMY Right 01/11/2017   Procedure: CALCANEAL OSTEOTOMY;  Surgeon: Earnestine Leys, MD;  Location: [facility];  Service: Orthopedics;  Laterality: Right;  . COLONOSCOPY    . HERNIA REPAIR Left    groin  . LEG SURGERY Right    Had surgery on right calf X 5  . LITHOTRIPSY    . QUADRICEPS TENDON REPAIR Left 06/22/2015   Procedure: REPAIR QUADRICEP TENDON;  Surgeon: Earnestine Leys, MD;  Location: [facility];  Service: Orthopedics;  Laterality: Left;  Marland Kitchen QUADRICEPS TENDON REPAIR Left 08/12/2015   Procedure: REPAIR QUADRICEP TENDON;  Surgeon: Earnestine Leys, MD;  Location: [facility];  Service: Orthopedics;  Laterality: Left;  . ROTATOR CUFF REPAIR Left   . TONSILLECTOMY    . VASECTOMY     with hydrocele repaired    Home Medications:  Allergies as of 11/23/2017      Reactions   Methocarbamol Other (See Comments)   Numbness, and change of taste    Tizanidine Nausea Only      Medication List        Accurate as of 11/23/17  9:27 AM. Always use your most recent med list.          amLODipine-benazepril 10-40 MG capsule Commonly known as:  LOTREL Take 1 capsule by mouth daily.   aspirin EC 81 MG tablet Take 81 mg by mouth daily.   cetirizine 10 MG tablet Commonly known as:  ZYRTEC 1 to 2 tablets daily as needed   finasteride 5 MG tablet Commonly known as:  PROSCAR Take 1 tablet (5 mg total) by mouth daily.   fluconazole 100 MG tablet Commonly known as:  DIFLUCAN Take 1 tablet (100 mg total) by mouth daily. X 7 days   fluticasone 50 MCG/ACT nasal spray Commonly known as:  FLONASE Place 2 sprays into both nostrils daily.   hydrochlorothiazide 25 MG tablet Commonly known as:  HYDRODIURIL Take 1 tablet (25 mg total) by mouth daily.   meloxicam 15 MG tablet Commonly known as:  MOBIC Take 1 tablet (15 mg total) by mouth daily.     metoprolol tartrate 25 MG tablet Commonly known as:  LOPRESSOR  Take 1 tablet (25 mg total) by mouth 2 (two) times daily.   montelukast 10 MG tablet Commonly known as:  SINGULAIR Take 1 tablet (10 mg total) by mouth every morning.   multivitamin with minerals Tabs tablet Take 1 tablet by mouth daily.   nystatin-triamcinolone ointment Commonly known as:  MYCOLOG Apply 1 application topically 2 (two) times daily.   omeprazole 10 MG capsule Commonly known as:  PRILOSEC Take 10 mg by mouth daily.   oxyCODONE-acetaminophen 10-325 MG tablet Commonly known as:  PERCOCET Take 1 tablet by mouth every 4 (four) hours as needed for pain.   tamsulosin 0.4 MG Caps capsule Commonly known as:  FLOMAX TAKE TWO CAPSULES BY MOUTH EACH DAY AS DIRECTED       Allergies:  Allergies  Allergen Reactions  . Methocarbamol Other (See Comments)    Numbness, and change of taste   . Tizanidine Nausea Only    Family History: Family History  Problem Relation Age of Onset  . Prostate cancer Father   . Heart disease Father   . Diabetes Father   . Alzheimer's disease Father   . Anemia Mother   . Osteoporosis Mother   . Diabetes Brother   . Hypertension Brother   . Anemia Daughter   . Kidney disease Neg Hx   . Bladder Cancer Neg Hx     Social History:  reports that  has never smoked. he has never used smokeless tobacco. He reports that he does not drink alcohol or use drugs.  ROS: UROLOGY Frequent Urination?: Yes Hard to postpone urination?: No Burning/pain with urination?: Yes Get up at night to urinate?: No Leakage of urine?: No Urine stream starts and stops?: No Trouble starting stream?: No Do you have to strain to urinate?: No Blood in urine?: No Urinary tract infection?: No Sexually transmitted disease?: No Injury to kidneys or bladder?: No Painful intercourse?: No Weak stream?: No Erection problems?: No Penile pain?: No  Gastrointestinal Nausea?: No Vomiting?:  No Indigestion/heartburn?: No Diarrhea?: No Constipation?: No  Constitutional Fever: No Night sweats?: No Weight loss?: No Fatigue?: No  Skin Skin rash/lesions?: No Itching?: No  Eyes Blurred vision?: No Double vision?: No  Ears/Nose/Throat Sore throat?: No Sinus problems?: No  Hematologic/Lymphatic Swollen glands?: No Easy bruising?: No  Cardiovascular Leg swelling?: No Chest pain?: No  Respiratory Cough?: No Shortness of breath?: No  Endocrine Excessive thirst?: No  Musculoskeletal Back pain?: No Joint pain?: No  Neurological Headaches?: No Dizziness?: No  Psychologic Depression?: No Anxiety?: No  Physical Exam: BP (!) 151/81 (BP Location: Right Arm, Patient Position: Sitting, Cuff Size: Large)   Pulse 82   Ht 5\' 6"  (1.676 m)   Wt 286 lb (129.7 kg)   BMI 46.16 kg/m   Constitutional: Well nourished. Alert and oriented, No acute distress. HEENT:  AT, moist mucus membranes. Trachea midline, no masses. Cardiovascular: No clubbing, cyanosis, or edema. Respiratory: Normal respiratory effort, no increased work of breathing. GI: Obese.  Abdomen is soft, non tender, non distended, no abdominal masses. Liver and spleen not palpable.  No distinct hernias appreciated.  Stool sample for occult testing is not indicated.   GU: No CVA tenderness.  No bladder fullness or masses.  Patient with buried penis.  Glans with erythema and satellite lesions.  There are adhesions to the glands of the foreskin and it appears that one of these areas was torn from the glands.  There appears to be sebum trapped behind the adhesions.  Foreskin is slightly  edematous.  Scrotum without lesions, cysts, rashes and/or edema.  Testicles are located scrotally bilaterally. No masses are appreciated in the testicles. Left and right epididymis are normal. Rectal: Patient with  normal sphincter tone. Anus and perineum without scarring or rashes. No rectal masses are appreciated. Prostate is  approximately 45 grams, no nodules are appreciated. Seminal vesicles are normal. Skin: No rashes, bruises or suspicious lesions. Lymph: No cervical or inguinal adenopathy. Neurologic: Grossly intact, no focal deficits, moving all 4 extremities. Psychiatric: Normal mood and affect.  Laboratory Data: Lab Results  Component Value Date   WBC 4.5 08/10/2017   HGB 10.6 (L) 08/10/2017   HCT 34.1 (L) 08/10/2017   MCV 62 (L) 08/10/2017   PLT 239 08/10/2017    Lab Results  Component Value Date   CREATININE 0.99 08/10/2017    Lab Results  Component Value Date   PSA 3.31 10/28/2016   PSA 3.2 05/16/2014       PSA                        3.2                                                                   09/09/2015  Lab Results  Component Value Date   HGBA1C 5.6 08/09/2017    Lab Results  Component Value Date   AST 30 08/10/2017   Lab Results  Component Value Date   ALT 28 08/10/2017   Urinalysis Negative.  See Epic.   I have reviewed the labs   Assessment & Plan:    1.  Yeast balanitis  - started on diflucan 100 mg qd x 7 days  - will schedule for circumcision at this time   - Explained to the patient on how the procedure is performed and the risks of pain, swelling, bleeding, infection, penile injury or deformity and penile necrosis  - I also explained the risks of general anesthesia, such as: MI, CVA, paralysis, coma and/or death.  2.  Penile adhesions  - patient would like to move forward with circumcision at this time  - given Percocet 10/325, q 4 hours prn for pain, # 10 - checked registry - last narcotic script given in 12/2016  - will need cardiac clearance from Dr. Nehemiah Massed to stop ASA  3. BPH with LUTS  - IPSS score is 17/5, it is stable  - Continue conservative management, avoiding bladder irritants and timed voiding's  - most bothersome symptoms is/are frequency  - on maximal medical therapy with tamsulosin 0.4 mg x 2 and finasteride 5 mg daily still  symptomatic  - Cannot tolerate medication or medication failure, schedule cystoscopy at the time of the circumcision  - I have explained to the patient that they will  be scheduled for a cystoscopy in our office to evaluate their bladder.  The cystoscopy consists of passing a tube with a lens up through their urethra and into their urinary bladder.  After the procedure, they might experience blood in the urine and discomfort with urination.  This will abate after the first few voids.  I have  encouraged the patient to increase water intake  during this time.  Patient denies any allergies to lidocaine.   -  UA  - urine culture  - BMP  - CBC   Return for circumcision and cystoscopy in the OR.  These notes generated with voice recognition software. I apologize for typographical errors.  Zara Council, [facility] [address] [place], [place] [address] [phone]

## 2017-11-23 ENCOUNTER — Encounter: Payer: Self-pay | Admitting: Urology

## 2017-11-23 ENCOUNTER — Ambulatory Visit: Payer: BLUE CROSS/BLUE SHIELD | Admitting: Urology

## 2017-11-23 VITALS — BP 151/81 | HR 82 | Ht 66.0 in | Wt 286.0 lb

## 2017-11-23 DIAGNOSIS — N401 Enlarged prostate with lower urinary tract symptoms: Secondary | ICD-10-CM | POA: Diagnosis not present

## 2017-11-23 DIAGNOSIS — N475 Adhesions of prepuce and glans penis: Secondary | ICD-10-CM | POA: Diagnosis not present

## 2017-11-23 DIAGNOSIS — N138 Other obstructive and reflux uropathy: Secondary | ICD-10-CM | POA: Diagnosis not present

## 2017-11-23 DIAGNOSIS — B3742 Candidal balanitis: Secondary | ICD-10-CM | POA: Diagnosis not present

## 2017-11-23 LAB — URINALYSIS, COMPLETE
Bilirubin, UA: NEGATIVE
Glucose, UA: NEGATIVE
Leukocytes, UA: NEGATIVE
Nitrite, UA: NEGATIVE
Protein, UA: NEGATIVE
RBC, UA: NEGATIVE
Specific Gravity, UA: 1.025 (ref 1.005–1.030)
Urobilinogen, Ur: 0.2 mg/dL (ref 0.2–1.0)
pH, UA: 5.5 (ref 5.0–7.5)

## 2017-11-23 LAB — MICROSCOPIC EXAMINATION
Epithelial Cells (non renal): NONE SEEN /hpf (ref 0–10)
RBC, UA: NONE SEEN /hpf (ref 0–?)

## 2017-11-23 MED ORDER — OXYCODONE-ACETAMINOPHEN 10-325 MG PO TABS: 1.0000 | ORAL_TABLET | ORAL | 0 refills | Status: DC | PRN

## 2017-11-23 MED ORDER — FLUCONAZOLE 100 MG PO TABS: 100.0000 mg | ORAL_TABLET | Freq: Every day | ORAL | 0 refills | Status: DC

## 2017-11-24 LAB — PSA: Prostate Specific Ag, Serum: 2.1 ng/mL (ref 0.0–4.0)

## 2017-11-25 LAB — URINE CULTURE

## 2017-11-27 ENCOUNTER — Telehealth: Payer: Self-pay | Admitting: Radiology

## 2017-11-27 ENCOUNTER — Other Ambulatory Visit: Payer: Self-pay | Admitting: Radiology

## 2017-11-27 ENCOUNTER — Telehealth: Payer: Self-pay

## 2017-11-27 DIAGNOSIS — N401 Enlarged prostate with lower urinary tract symptoms: Secondary | ICD-10-CM

## 2017-11-27 DIAGNOSIS — B3742 Candidal balanitis: Secondary | ICD-10-CM

## 2017-11-27 DIAGNOSIS — N138 Other obstructive and reflux uropathy: Secondary | ICD-10-CM

## 2017-11-27 DIAGNOSIS — N475 Adhesions of prepuce and glans penis: Secondary | ICD-10-CM

## 2017-11-27 NOTE — Telephone Encounter (Signed)
-----   Message from Nori Riis, PA-C sent at 11/24/2017  7:47 AM EST ----- Please let Mr. Doolan know that his PSA was normal.

## 2017-11-27 NOTE — Telephone Encounter (Signed)
Discussed surgery with pt. Will schedule for 12/13/17 with Dr Erlene Quan. Advised pt of appt for clearance with Dr Nehemiah Massed on 12/06/17 @4 :00. Pt voices understanding.

## 2017-11-27 NOTE — Telephone Encounter (Signed)
Letter sent.

## 2017-11-27 NOTE — Telephone Encounter (Signed)
LMOM to return call to schedule surgery.

## 2017-12-06 DIAGNOSIS — I10 Essential (primary) hypertension: Secondary | ICD-10-CM | POA: Diagnosis not present

## 2017-12-06 DIAGNOSIS — E782 Mixed hyperlipidemia: Secondary | ICD-10-CM | POA: Diagnosis not present

## 2017-12-06 DIAGNOSIS — G4733 Obstructive sleep apnea (adult) (pediatric): Secondary | ICD-10-CM | POA: Diagnosis not present

## 2017-12-07 ENCOUNTER — Encounter
Admission: RE | Admit: 2017-12-07 | Discharge: 2017-12-07 | Disposition: A | Discharge status: Home / Self Care | Payer: BLUE CROSS/BLUE SHIELD | Source: Ambulatory Visit | Attending: Urology | Admitting: Urology

## 2017-12-07 ENCOUNTER — Other Ambulatory Visit: Payer: Self-pay

## 2017-12-07 DIAGNOSIS — Z01812 Encounter for preprocedural laboratory examination: Secondary | ICD-10-CM | POA: Insufficient documentation

## 2017-12-07 DIAGNOSIS — Z01810 Encounter for preprocedural cardiovascular examination: Secondary | ICD-10-CM | POA: Diagnosis not present

## 2017-12-07 DIAGNOSIS — G4733 Obstructive sleep apnea (adult) (pediatric): Secondary | ICD-10-CM | POA: Diagnosis not present

## 2017-12-07 DIAGNOSIS — I10 Essential (primary) hypertension: Secondary | ICD-10-CM | POA: Insufficient documentation

## 2017-12-07 LAB — BASIC METABOLIC PANEL
Anion gap: 7 (ref 5–15)
BUN: 13 mg/dL (ref 6–20)
CO2: 27 mmol/L (ref 22–32)
Calcium: 9.6 mg/dL (ref 8.9–10.3)
Chloride: 102 mmol/L (ref 101–111)
Creatinine, Ser: 0.93 mg/dL (ref 0.61–1.24)
GFR calc Af Amer: >60 mL/min (ref >60)
GFR calc non Af Amer: >60 mL/min (ref >60)
Glucose, Bld: 107 mg/dL — ABNORMAL HIGH (ref 65–99)
Potassium: 3.9 mmol/L (ref 3.5–5.1)
Sodium: 136 mmol/L (ref 135–145)

## 2017-12-07 LAB — CBC
HCT: 34.4 % — ABNORMAL LOW (ref 40.0–52.0)
Hemoglobin: 10.8 g/dL — ABNORMAL LOW (ref 13.0–18.0)
MCH: 19.7 pg — ABNORMAL LOW (ref 26.0–34.0)
MCHC: 31.5 g/dL — ABNORMAL LOW (ref 32.0–36.0)
MCV: 62.6 fL — ABNORMAL LOW (ref 80.0–100.0)
Platelets: 220 10*3/uL (ref 150–440)
RBC: 5.49 MIL/uL (ref 4.40–5.90)
RDW: 15.8 % — ABNORMAL HIGH (ref 11.5–14.5)
WBC: 4.5 10*3/uL (ref 3.8–10.6)

## 2017-12-07 NOTE — Patient Instructions (Signed)
Your procedure is scheduled on: Wednesday 12/13/16 Report to [facility]. 2ND FLOOR MEDICAL MALL ENTRANCE. To find out your arrival time please call [phone] between 1PM - 3PM on Monday 12/11/17.  Remember: Instructions that are not followed completely may result in serious medical risk, up to and including death, or upon the discretion of your surgeon and anesthesiologist your surgery may need to be rescheduled.    __X__ 1. Do not eat anything after midnight the night before your    procedure.  No gum chewing or hard candies.  You may drink clear   liquids up to 2 hours before you are scheduled to arrive at the   hospital for your procedure. Do not drink clear liquids within 2   hours of scheduled arrival to the hospital as this may lead to your   procedure being delayed or rescheduled.       Clear liquids include:   Water or Apple juice without pulp   Clear carbohydrate beverage such as Clearfast or Gatorade   Black coffee or Clear Tea (no milk, no creamer, do not add anything   to the coffee or tea)    Diabetics should only drink water   __X__ 2. No Alcohol for 24 hours before or after surgery.   ____ 3. Bring all medications with you on the day of surgery if instructed.    __X__ 4. Notify your doctor if there is any change in your medical condition     (cold, fever, infections).             __X___5. No smoking within 24 hours of your surgery.     Do not wear jewelry, make-up, hairpins, clips or nail polish.  Do not wear lotions, powders, or perfumes.   Do not shave 48 hours prior to surgery. Men may shave face and neck.  Do not bring valuables to the hospital.    [facility] is not responsible for any belongings or valuables.               Contacts, dentures or bridgework may not be worn into surgery.  Leave your suitcase in the car. After surgery it may be brought to your room.  For patients admitted to the hospital, discharge time is determined by your                 treatment team.   Patients discharged the day of surgery will not be allowed to drive home.   Please read over the following fact sheets that you were given:   MRSA Information   __X__ Take these medicines the morning of surgery with A SIP OF WATER:    1. CETIRIZINE/ZYRTEC  2. FINASTERIDE/PROSCAR  3. METOPROLOL  4. OMEPRAZOLE  5. TAMSULOSIN/FLOMAX  6.  ____ Fleet Enema (as directed)   __X__ Use CHG Soap/SAGE wipes as directed  ____ Use inhalers on the day of surgery  ____ Stop metformin 2 days prior to surgery    ____ Take 1/2 of usual insulin dose the night before surgery and none on the morning of surgery.   __X__ Stop Coumadin/Plavix/aspirin on LAST DOSE WAS 12/05/17  __X__ Stop Anti-inflammatories such as Advil, Aleve, Ibuprofen, Motrin, Naproxen, Naprosyn, Goodies,powder, or aspirin products.  OK to take Tylenol. STOP MELOXICAM   __X__ Stop supplements, Vitamin E, Fish Oil until after surgery.    __X__ Bring C-Pap to the hospital.

## 2017-12-12 MED ORDER — CEFAZOLIN SODIUM-DEXTROSE 2-4 GM/100ML-% IV SOLN
2.0000 g | INTRAVENOUS | Status: AC
  Administered 2017-12-13: 2 g via INTRAVENOUS

## 2017-12-13 ENCOUNTER — Ambulatory Visit: Payer: BLUE CROSS/BLUE SHIELD | Admitting: Certified Registered"

## 2017-12-13 ENCOUNTER — Encounter: Payer: Self-pay | Admitting: Anesthesiology

## 2017-12-13 ENCOUNTER — Ambulatory Visit
Admission: RE | Admit: 2017-12-13 | Discharge: 2017-12-13 | Disposition: A | Discharge status: Home / Self Care | Payer: BLUE CROSS/BLUE SHIELD | Source: Ambulatory Visit | Attending: Urology | Admitting: Urology

## 2017-12-13 ENCOUNTER — Encounter
Admission: RE | Disposition: A | Discharge status: Home / Self Care | Payer: Self-pay | Source: Ambulatory Visit | Attending: Urology

## 2017-12-13 DIAGNOSIS — Z79899 Other long term (current) drug therapy: Secondary | ICD-10-CM | POA: Insufficient documentation

## 2017-12-13 DIAGNOSIS — N4889 Other specified disorders of penis: Secondary | ICD-10-CM | POA: Insufficient documentation

## 2017-12-13 DIAGNOSIS — Z87442 Personal history of urinary calculi: Secondary | ICD-10-CM | POA: Insufficient documentation

## 2017-12-13 DIAGNOSIS — D649 Anemia, unspecified: Secondary | ICD-10-CM | POA: Insufficient documentation

## 2017-12-13 DIAGNOSIS — N401 Enlarged prostate with lower urinary tract symptoms: Secondary | ICD-10-CM | POA: Diagnosis not present

## 2017-12-13 DIAGNOSIS — N475 Adhesions of prepuce and glans penis: Secondary | ICD-10-CM | POA: Diagnosis not present

## 2017-12-13 DIAGNOSIS — N138 Other obstructive and reflux uropathy: Secondary | ICD-10-CM

## 2017-12-13 DIAGNOSIS — I10 Essential (primary) hypertension: Secondary | ICD-10-CM | POA: Insufficient documentation

## 2017-12-13 DIAGNOSIS — F329 Major depressive disorder, single episode, unspecified: Secondary | ICD-10-CM | POA: Diagnosis not present

## 2017-12-13 DIAGNOSIS — Z7982 Long term (current) use of aspirin: Secondary | ICD-10-CM | POA: Diagnosis not present

## 2017-12-13 DIAGNOSIS — G4730 Sleep apnea, unspecified: Secondary | ICD-10-CM | POA: Diagnosis not present

## 2017-12-13 DIAGNOSIS — Q558 Other specified congenital malformations of 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 organs: Secondary | ICD-10-CM | POA: Diagnosis not present

## 2017-12-13 DIAGNOSIS — B3742 Candidal balanitis: Secondary | ICD-10-CM

## 2017-12-13 DIAGNOSIS — K219 Gastro-esophageal reflux disease without esophagitis: Secondary | ICD-10-CM | POA: Insufficient documentation

## 2017-12-13 HISTORY — PX: CIRCUMCISION: SHX1350

## 2017-12-13 HISTORY — PX: CYSTOSCOPY: SHX5120

## 2017-12-13 SURGERY — CYSTOSCOPY
Anesthesia: General

## 2017-12-13 MED ORDER — LIDOCAINE 1% INJECTION FOR CIRCUMCISION
INJECTION | INTRAVENOUS | Status: DC | PRN
  Administered 2017-12-13: 10 mL via SUBCUTANEOUS

## 2017-12-13 MED ORDER — EPHEDRINE SULFATE 50 MG/ML IJ SOLN
INTRAMUSCULAR | Status: DC | PRN
  Administered 2017-12-13 (×2): 10 mg via INTRAVENOUS

## 2017-12-13 MED ORDER — PROPOFOL 10 MG/ML IV BOLUS
INTRAVENOUS | Status: AC
  Filled 2017-12-13: qty 20

## 2017-12-13 MED ORDER — LIDOCAINE HCL (CARDIAC) 20 MG/ML IV SOLN
INTRAVENOUS | Status: DC | PRN
  Administered 2017-12-13: 100 mg via INTRAVENOUS

## 2017-12-13 MED ORDER — LACTATED RINGERS IV SOLN
INTRAVENOUS | Status: DC
  Administered 2017-12-13: 10:00:00 via INTRAVENOUS

## 2017-12-13 MED ORDER — MIDAZOLAM HCL 2 MG/2ML IJ SOLN
INTRAMUSCULAR | Status: AC
  Filled 2017-12-13: qty 2

## 2017-12-13 MED ORDER — PROPOFOL 10 MG/ML IV BOLUS
INTRAVENOUS | Status: DC | PRN
  Administered 2017-12-13: 200 mg via INTRAVENOUS

## 2017-12-13 MED ORDER — ONDANSETRON HCL 4 MG/2ML IJ SOLN
INTRAMUSCULAR | Status: DC | PRN
  Administered 2017-12-13: 4 mg via INTRAVENOUS

## 2017-12-13 MED ORDER — BACITRACIN ZINC 500 UNIT/GM EX OINT
TOPICAL_OINTMENT | CUTANEOUS | Status: AC
  Filled 2017-12-13: qty 28.35

## 2017-12-13 MED ORDER — FENTANYL CITRATE (PF) 100 MCG/2ML IJ SOLN
INTRAMUSCULAR | Status: AC
  Filled 2017-12-13: qty 2

## 2017-12-13 MED ORDER — FENTANYL CITRATE (PF) 100 MCG/2ML IJ SOLN
INTRAMUSCULAR | Status: DC | PRN
  Administered 2017-12-13: 25 ug via INTRAVENOUS
  Administered 2017-12-13: 50 ug via INTRAVENOUS
  Administered 2017-12-13: 25 ug via INTRAVENOUS

## 2017-12-13 MED ORDER — ONDANSETRON HCL 4 MG/2ML IJ SOLN
INTRAMUSCULAR | Status: AC
  Filled 2017-12-13: qty 2

## 2017-12-13 MED ORDER — MIDAZOLAM HCL 2 MG/2ML IJ SOLN
INTRAMUSCULAR | Status: DC | PRN
  Administered 2017-12-13: 2 mg via INTRAVENOUS

## 2017-12-13 MED ORDER — GLYCOPYRROLATE 0.2 MG/ML IJ SOLN
INTRAMUSCULAR | Status: AC
  Filled 2017-12-13: qty 1

## 2017-12-13 MED ORDER — BACITRACIN 500 UNIT/GM EX OINT
TOPICAL_OINTMENT | CUTANEOUS | Status: DC | PRN
  Administered 2017-12-13: 1 via TOPICAL

## 2017-12-13 MED ORDER — DEXAMETHASONE SODIUM PHOSPHATE 10 MG/ML IJ SOLN
INTRAMUSCULAR | Status: AC
  Filled 2017-12-13: qty 1

## 2017-12-13 MED ORDER — EPHEDRINE SULFATE 50 MG/ML IJ SOLN
INTRAMUSCULAR | Status: AC
Start: 2017-12-13 — End: ?
  Filled 2017-12-13: qty 1

## 2017-12-13 MED ORDER — GLYCOPYRROLATE 0.2 MG/ML IJ SOLN
INTRAMUSCULAR | Status: DC | PRN
  Administered 2017-12-13: 0.2 mg via INTRAVENOUS

## 2017-12-13 MED ORDER — DEXAMETHASONE SODIUM PHOSPHATE 10 MG/ML IJ SOLN
INTRAMUSCULAR | Status: DC | PRN
  Administered 2017-12-13: 10 mg via INTRAVENOUS

## 2017-12-13 MED ORDER — FENTANYL CITRATE (PF) 100 MCG/2ML IJ SOLN
25.0000 ug | INTRAMUSCULAR | Status: DC | PRN
  Administered 2017-12-13 (×4): 25 ug via INTRAVENOUS

## 2017-12-13 MED ORDER — LIDOCAINE HCL (PF) 2 % IJ SOLN
INTRAMUSCULAR | Status: AC
  Filled 2017-12-13: qty 10

## 2017-12-13 MED ORDER — FENTANYL CITRATE (PF) 100 MCG/2ML IJ SOLN
INTRAMUSCULAR | Status: AC
  Administered 2017-12-13: 25 ug via INTRAVENOUS
  Filled 2017-12-13: qty 2

## 2017-12-13 MED ORDER — ONDANSETRON HCL 4 MG/2ML IJ SOLN: 4.0000 mg | Freq: Once | INTRAMUSCULAR | Status: DC | PRN

## 2017-12-13 MED ORDER — LIDOCAINE HCL (PF) 1 % IJ SOLN
INTRAMUSCULAR | Status: AC
  Filled 2017-12-13: qty 30

## 2017-12-13 SURGICAL SUPPLY — 37 items
BAG DRAIN CYSTO-URO LG1000N (MISCELLANEOUS) ×2 IMPLANT
BLADE CLIPPER SURG (BLADE) ×2 IMPLANT
BLADE SURG 15 STRL LF DISP TIS (BLADE) ×1 IMPLANT
BLADE SURG 15 STRL SS (BLADE) ×2
BNDG CMPR 75X21 PLY HI ABS (MISCELLANEOUS) ×1
BNDG COHESIVE 1X5 TAN NS LF (GAUZE/BANDAGES/DRESSINGS) ×2 IMPLANT
BRUSH SCRUB EZ 1% IODOPHOR (MISCELLANEOUS) ×2 IMPLANT
CANISTER SUCT 1200ML W/VALVE (MISCELLANEOUS) ×2 IMPLANT
CATH URETL 5X70 OPEN END (CATHETERS) ×2 IMPLANT
CHLORAPREP W/TINT 26ML (MISCELLANEOUS) ×2 IMPLANT
DRAPE LAPAROTOMY 77X122 PED (DRAPES) ×2 IMPLANT
ELECT REM PT RETURN 9FT ADLT (ELECTROSURGICAL) ×2
ELECTRODE REM PT RTRN 9FT ADLT (ELECTROSURGICAL) ×1 IMPLANT
GAUZE PETROLATUM 1 X8 (GAUZE/BANDAGES/DRESSINGS) ×2 IMPLANT
GAUZE STRETCH 2X75IN STRL (MISCELLANEOUS) ×2 IMPLANT
GLOVE BIO SURGEON STRL SZ 6.5 (GLOVE) ×2 IMPLANT
GOWN STRL REUS W/ TWL LRG LVL3 (GOWN DISPOSABLE) ×2 IMPLANT
GOWN STRL REUS W/TWL LRG LVL3 (GOWN DISPOSABLE) ×4
KIT RM TURNOVER STRD PROC AR (KITS) ×2 IMPLANT
LABEL OR SOLS (LABEL) ×2 IMPLANT
NDL HYPO 25X1 1.5 SAFETY (NEEDLE) ×1 IMPLANT
NEEDLE HYPO 25X1 1.5 SAFETY (NEEDLE) ×2 IMPLANT
NS IRRIG 500ML POUR BTL (IV SOLUTION) ×2 IMPLANT
PACK BASIN MINOR ARMC (MISCELLANEOUS) ×2 IMPLANT
PACK CYSTO AR (MISCELLANEOUS) ×2 IMPLANT
SENSORWIRE 0.038 NOT ANGLED (WIRE) ×4
SET CYSTO W/LG BORE CLAMP LF (SET/KITS/TRAYS/PACK) ×2 IMPLANT
SOL .9 NS 3000ML IRR  AL (IV SOLUTION) ×1
SOL .9 NS 3000ML IRR AL (IV SOLUTION) ×1
SOL .9 NS 3000ML IRR UROMATIC (IV SOLUTION) ×1 IMPLANT
SOL PREP PVP 2OZ (MISCELLANEOUS) ×2
SOLUTION PREP PVP 2OZ (MISCELLANEOUS) ×1 IMPLANT
SURGILUBE 2OZ TUBE FLIPTOP (MISCELLANEOUS) ×2 IMPLANT
SUT CHROMIC 3 0 SH 27 (SUTURE) ×4 IMPLANT
SYR 10ML LL (SYRINGE) ×2 IMPLANT
WATER STERILE IRR 1000ML POUR (IV SOLUTION) ×2 IMPLANT
WIRE SENSOR 0.038 NOT ANGLED (WIRE) ×2 IMPLANT

## 2017-12-13 NOTE — Anesthesia Procedure Notes (Signed)
Procedure Name: LMA Insertion Date/Time: 12/13/2017 11:35 AM Performed by: Silvana Newness, CRNA Pre-anesthesia Checklist: Patient identified, Emergency Drugs available, Suction available, Patient being monitored and Timeout performed Patient Re-evaluated:Patient Re-evaluated prior to induction Oxygen Delivery Method: Circle system utilized Preoxygenation: Pre-oxygenation with 100% oxygen Induction Type: IV induction Ventilation: Mask ventilation without difficulty LMA: LMA inserted LMA Size: 5.0 Number of attempts: 1 Placement Confirmation: breath sounds checked- equal and bilateral and positive ETCO2 Tube secured with: Tape Dental Injury: Teeth and Oropharynx as per pre-operative assessment

## 2017-12-13 NOTE — Interval H&P Note (Signed)
History and Physical Interval Note:  12/13/2017 11:00 AM  Justin Soto  has presented today for surgery, with the diagnosis of Balanitis, penile adhesions, BPH with obstruction/LUTS  The various methods of treatment have been discussed with the patient and family. After consideration of risks, benefits and other options for treatment, the patient has consented to  Procedure(s): CYSTOSCOPY (N/A) CIRCUMCISION ADULT (N/A) as a surgical intervention .  The patient's history has been reviewed, patient examined, no change in status, stable for surgery.  I have reviewed the patient's chart and labs.  Questions were answered to the patient's satisfaction.    Patient examined in preop holding area.  Penile issue most consent with penile adhesions/ bands and buried penis rather than phimosis.  Patient also notes issues have started in the past 2 years with significant weight gain.  Will procede with LOA, cysto with possible but less likely redo circ.  RRR CTAB  Hollice Espy

## 2017-12-13 NOTE — Anesthesia Preprocedure Evaluation (Signed)
Anesthesia Evaluation  Patient identified by MRN, date of birth, ID band Patient awake    Reviewed: Allergy & Precautions, NPO status , Patient's Chart, lab work & pertinent test results, reviewed documented beta blocker date and time   Airway Mallampati: III  TM Distance: >3 FB     Dental  (+) Chipped   Pulmonary sleep apnea ,           Cardiovascular hypertension,      Neuro/Psych PSYCHIATRIC DISORDERS Depression    GI/Hepatic GERD  Controlled,  Endo/Other  Morbid obesity  Renal/GU Renal disease     Musculoskeletal   Abdominal   Peds  Hematology  (+) anemia ,   Anesthesia Other Findings ADD.  Reproductive/Obstetrics                             Anesthesia Physical Anesthesia Plan  ASA: III  Anesthesia Plan: General   Post-op Pain Management:    Induction: Intravenous  PONV Risk Score and Plan:   Airway Management Planned: LMA  Additional Equipment:   Intra-op Plan:   Post-operative Plan:   Informed Consent: I have reviewed the patients History and Physical, chart, labs and discussed the procedure including the risks, benefits and alternatives for the proposed anesthesia with the patient or authorized representative who has indicated his/her understanding and acceptance.     Plan Discussed with: CRNA  Anesthesia Plan Comments:         Anesthesia Quick Evaluation

## 2017-12-13 NOTE — Discharge Instructions (Signed)
° °  Circumcision Information and Post Care Instructions  After-care: After the procedure your whole penis will be swollen and look very bruised. This is a normal effect of both the injected anaesthetic and the handling it necessarily receives during the operation. These will gradually reduce over the next week or two.  Underwear If you normally wear boxers you may find that they give insufficient support immediately post procedure. You may wish to consider some form of briefs which will hold your penis in position to provide support and reduce the friction  The Bandage The bandage will normally be wound tightly around the penis. Leave for 48hrs and keep clean and dry.  If the bandage falls off or you are unable to void, he will need to remove the bandage.  Promoting Healing After removing the bandage, like you to keep the area well lubricated with bacitracin or Vaseline ointment.  Please pull back your penile skin multiple times daily to help keep the skin from re-adhering.  The Stitches Stitches need to remain in place long enough for the cut edges to knit together but not so long as to allow the skin around them to fully heal. In practice this usually means they should remain for between 1 and 2 weeks. Although the doctor will normally use soluble (or self-dissolving) stitches and will dissolve/ fall out on their own.     AMBULATORY SURGERY  DISCHARGE INSTRUCTIONS   1) The drugs that you were given will stay in your system until tomorrow so for the next 24 hours you should not:  A) Drive an automobile B) Make any legal decisions C) Drink any alcoholic beverage   2) You may resume regular meals tomorrow.  Today it is better to start with liquids and gradually work up to solid foods.  You may eat anything you prefer, but it is better to start with liquids, then soup and crackers, and gradually work up to solid foods.   3) Please notify your doctor immediately if you have any unusual  bleeding, trouble breathing, redness and pain at the surgery site, drainage, fever, or pain not relieved by medication.    4) Additional Instructions:Use the Bacitracin ointment as instructed.   Drink plenty of fluids to be able to pee frequently.  Keep your bladder empty.  Continue to follow all instructions from Dr. Erlene Quan.   Please contact your physician with any problems or Same Day Surgery at [phone], Monday through Friday 6 am to 4 pm, or [place] at [facility] number at [phone].

## 2017-12-13 NOTE — Transfer of Care (Signed)
Immediate Anesthesia Transfer of Care Note  Patient: Justin Soto  Procedure(s) Performed: CYSTOSCOPY (N/A ) PENILE BLOCK, LYSIS OF PENILE ADHESIONS (N/A )  Patient Location: PACU  Anesthesia Type:General  Level of Consciousness: drowsy and patient cooperative  Airway & Oxygen Therapy: Patient Spontanous Breathing and Patient connected to face mask oxygen  Post-op Assessment: Report given to RN, Post -op Vital signs reviewed and stable and Patient moving all extremities X 4  Post vital signs: Reviewed and stable  Last Vitals:  Vitals:   12/13/17 0958  BP: 135/71  Pulse: 71  Resp: 17  Temp: 36.7 C  SpO2: 97%    Last Pain:  Vitals:   12/13/17 0958  TempSrc: Oral         Complications: No apparent anesthesia complications

## 2017-12-13 NOTE — Anesthesia Post-op Follow-up Note (Signed)
Anesthesia QCDR form completed.        

## 2017-12-13 NOTE — Anesthesia Postprocedure Evaluation (Signed)
Anesthesia Post Note  Patient: Justin Soto  Procedure(s) Performed: CYSTOSCOPY (N/A ) PENILE BLOCK, LYSIS OF PENILE ADHESIONS (N/A )  Patient location during evaluation: PACU Anesthesia Type: General Level of consciousness: awake and alert Pain management: pain level controlled Vital Signs Assessment: post-procedure vital signs reviewed and stable Respiratory status: spontaneous breathing, nonlabored ventilation, respiratory function stable and patient connected to nasal cannula oxygen Cardiovascular status: blood pressure returned to baseline and stable Postop Assessment: no apparent nausea or vomiting Anesthetic complications: no     Last Vitals:  Vitals:   12/13/17 1306 12/13/17 1313  BP: 129/67   Pulse: 94 100  Resp: 19 20  Temp:    SpO2: 93% 96%    Last Pain:  Vitals:   12/13/17 1313  TempSrc:   PainSc: [place]

## 2017-12-13 NOTE — Interval H&P Note (Signed)
History and Physical Interval Note:  12/13/2017 10:47 AM  Justin Soto  has presented today for surgery, with the diagnosis of Balanitis, penile adhesions, BPH with obstruction/LUTS  The various methods of treatment have been discussed with the patient and family. After consideration of risks, benefits and other options for treatment, the patient has consented to  Procedure(s): CYSTOSCOPY (N/A) CIRCUMCISION ADULT (N/A) as a surgical intervention .  The patient's history has been reviewed, patient examined, no change in status, stable for surgery.  I have reviewed the patient's chart and labs.  Questions were answered to the patient's satisfaction.    RRR CTAB   Hollice Espy

## 2017-12-13 NOTE — Op Note (Signed)
Date of procedure: 12/13/17  Preoperative diagnosis:  1. Penile adhesions 2. BPH with lower urinary tract symptoms  Postoperative diagnosis:  1. Same as above   Procedure: 1. Lysis of penile adhesions 2. Penile block 3. Flexible cystoscopy  Surgeon: Hollice Espy, MD  Anesthesia: General  Complications: None  Intraoperative findings: Mild bilobar coaptation without significant median lobe.  Normal bladder without masses or lesions.  Significant penile adhesions involving approximately 75% of coronal margin taken down sharply and bluntly.  Circumcision not deemed necessary.  EBL: Minimal  Specimens: None  Drains: None  Indication: TIYON SANOR is a 57 y.o. patient with a buried penis and penile adhesions.  In the preoperative holding area, the patient was examined and noted to have fairly significant penile adhesions involving approximately 75% of the coronal margin.  He had been previously circumcised.  He also had a buried phallus.  He was counseled to undergo lysis of penile adhesions with possible revision circumcision although it was not likely to be necessary.  Additionally, he has significant urinary symptoms and will undergo cystoscopy while under anesthesia.  After reviewing the management options for treatment, he elected to proceed with the above surgical procedure(s). We have discussed the potential benefits and risks of the procedure, side effects of the proposed treatment, the likelihood of the patient achieving the goals of the procedure, and any potential problems that might occur during the procedure or recuperation. Informed consent has been obtained.  Description of procedure:  The patient was taken to the operating room and general anesthesia was induced.  The patient was placed in the supine position, prepped and draped in the usual sterile fashion, and preoperative antibiotics were administered. A preoperative time-out was performed.   At this point time, the  phallus was carefully examined.  There was significant penile adhesions noted involving 75% of the coronal margin, most significantly on the ventral aspect of the phallus.  These are quite dense in nature.  It did appear that the patient had been previously circumcised.  He also had a buried penis.  There is very little redundant foreskin, especially on the ventral aspect somewhat tethering the penis.  At this point time, the adhesions were taken down both bluntly followed by sharply using the fine instruments.  On the left lateral aspect of the glands were there was a dense adhesion, there is a small tear within the glans itself requiring a small single interrupted 5-0 chromic suture to control the bleeding.  Once all adhesions have been adequately taken down, the phallus was then reinspected.  The role of circumcision at this point was not deemed necessary and in fact would likely represent an tethering of his penis with erections.  Next, a 11 French flexible cystoscope was advanced per urethra into the bladder.  He did normal pendulous urethra with mild bilobar coaptation of lateral lobes, approximately 4 cm in prostatic length without a significantly elevated bladder neck or median lobe appreciated.  The bladder itself was minimally trabeculated without stones, ulcerations, or any other lesions.  The trigone was in normal anatomic position with clear reflux of urine from both UOs.  Retroflexion was unremarkable.  The scope was then removed.  Finally, due to concern for postoperative pain, 10 cc of 1% lidocaine was used to perform a penile block.  Care was taken to aspirate prior to injecting this medication to avoid intravascular injection.  Finally, a dressing of copious amounts of bacitracin ointment, Vaseline gauze, conform followed by Coban was applied.  Patient was then clean and dry, reversed from anesthesia and taken the PACU in stable condition.  Plan: Patient will leave his dressing in place for  2 days.  Once this has been removed, he will use bacitracin or Vaseline ointment multiple times a day and continue to break any recurrent adhesions daily.  He will follow-up with Zara Council in 1 month.  Hollice Espy, M.D.

## 2017-12-14 ENCOUNTER — Encounter: Payer: Self-pay | Admitting: Urology

## 2017-12-14 ENCOUNTER — Telehealth: Payer: Self-pay

## 2017-12-14 NOTE — Telephone Encounter (Signed)
Patient called complaining of rawness and burning with urination at incision site. Per [name] patient was told this was normal and would need time to heal utilize Vaseline and ice over underwear for discomfort and OTC NSAIDS

## 2017-12-21 ENCOUNTER — Ambulatory Visit: Payer: BLUE CROSS/BLUE SHIELD | Admitting: Family Medicine

## 2017-12-21 ENCOUNTER — Encounter: Payer: Self-pay | Admitting: Family Medicine

## 2017-12-21 VITALS — BP 140/74 | HR 91 | Temp 98.8°F | Resp 24 | Wt 282.0 lb

## 2017-12-21 DIAGNOSIS — J069 Acute upper respiratory infection, unspecified: Secondary | ICD-10-CM

## 2017-12-21 NOTE — Patient Instructions (Signed)

## 2017-12-21 NOTE — Progress Notes (Signed)
Patient: Justin Soto Male    DOB: 1961/01/19   57 y.o.   MRN: [id number] Visit Date: 12/21/2017  Today's Provider: Lavon Paganini, MD   I, Martha Clan, CMA, am acting as scribe for Lavon Paganini, MD.  Chief Complaint  Patient presents with  . Sore Throat   Subjective:    Sore Throat   This is a new problem. Episode onset: x 3 days. The problem has been gradually worsening. Neither side of throat is experiencing more pain than the other. Maximum temperature: no documented temperature. The pain is at a severity of 10/10. The pain is severe. Associated symptoms include congestion, coughing (productive of brown sputum), ear pain (left), headaches, a hoarse voice, a plugged ear sensation, neck pain and trouble swallowing. Pertinent negatives include no abdominal pain, ear discharge, shortness of breath, swollen glands or vomiting. Treatments tried: cough drops. The treatment provided mild relief.       Allergies  Allergen Reactions  . Methocarbamol Other (See Comments)    Numbness, and change of taste   . Vicodin [Hydrocodone-Acetaminophen] Itching    Has to use benedryl  . Tizanidine Nausea Only     Current Outpatient Medications:  .  amLODipine-benazepril (LOTREL) 10-40 MG capsule, Take 1 capsule by mouth daily., Disp: 90 capsule, Rfl: 3 .  aspirin EC 81 MG tablet, Take 81 mg by mouth daily., Disp: , Rfl:  .  cetirizine (ZYRTEC) 10 MG tablet, 1 to 2 tablets daily as needed (Patient taking differently: Take 10 mg by mouth daily. ), Disp: 60 tablet, Rfl: 12 .  finasteride (PROSCAR) 5 MG tablet, Take 1 tablet (5 mg total) by mouth daily., Disp: 90 tablet, Rfl: 4 .  fluticasone (FLONASE) 50 MCG/ACT nasal spray, Place 2 sprays into both nostrils daily. (Patient taking differently: Place 2 sprays into both nostrils daily as needed. ), Disp: 16 g, Rfl: 12 .  hydrochlorothiazide (HYDRODIURIL) 25 MG tablet, Take 1 tablet (25 mg total) by mouth daily., Disp: 90 tablet,  Rfl: 1 .  meloxicam (MOBIC) 15 MG tablet, Take 1 tablet (15 mg total) by mouth daily., Disp: 30 tablet, Rfl: 0 .  metoprolol tartrate (LOPRESSOR) 25 MG tablet, Take 1 tablet (25 mg total) by mouth 2 (two) times daily. (Patient taking differently: Take 25 mg by mouth daily. ), Disp: 180 tablet, Rfl: 3 .  montelukast (SINGULAIR) 10 MG tablet, Take 1 tablet (10 mg total) by mouth every morning., Disp: 90 tablet, Rfl: 1 .  Multiple Vitamin (MULTIVITAMIN WITH MINERALS) TABS tablet, Take 1 tablet by mouth daily., Disp: , Rfl:  .  omeprazole (PRILOSEC) 10 MG capsule, Take 10 mg by mouth daily., Disp: , Rfl:  .  tamsulosin (FLOMAX) 0.4 MG CAPS capsule, TAKE TWO CAPSULES BY MOUTH EACH DAY AS DIRECTED, Disp: 60 capsule, Rfl: 6  Review of Systems  HENT: Positive for congestion, ear pain (left), hoarse voice and trouble swallowing. Negative for ear discharge.   Respiratory: Positive for cough (productive of brown sputum). Negative for shortness of breath.   Gastrointestinal: Negative for abdominal pain and vomiting.  Musculoskeletal: Positive for neck pain.  Neurological: Positive for headaches.    Social History   Tobacco Use  . Smoking status: Never Smoker  . Smokeless tobacco: Never Used  Substance Use Topics  . Alcohol use: No   Objective:   BP 140/74 (BP Location: Left Arm, Patient Position: Sitting, Cuff Size: Large)   Pulse 91   Temp 98.8 F (37.1 C) (  Oral)   Resp (!) 24   Wt 282 lb (127.9 kg)   SpO2 95%   BMI 44.17 kg/m  Vitals:   12/21/17 1514  BP: 140/74  Pulse: 91  Resp: (!) 24  Temp: 98.8 F (37.1 C)  TempSrc: Oral  SpO2: 95%  Weight: 282 lb (127.9 kg)     Physical Exam  Constitutional: He is oriented to person, place, and time. He appears well-developed and well-nourished. No distress.  HENT:  Head: Normocephalic and atraumatic.  Right Ear: Tympanic membrane, external ear and ear canal normal.  Left Ear: Tympanic membrane, external ear and ear canal normal.    Nose: Mucosal edema present. No rhinorrhea. Right sinus exhibits no maxillary sinus tenderness and no frontal sinus tenderness. Left sinus exhibits no maxillary sinus tenderness and no frontal sinus tenderness.  Mouth/Throat: Mucous membranes are normal. Posterior oropharyngeal erythema present. No oropharyngeal exudate or posterior oropharyngeal edema.  Eyes: Conjunctivae are normal. No scleral icterus.  Neck: Neck supple. No thyromegaly present.  Cardiovascular: Normal rate, regular rhythm, normal heart sounds and intact distal pulses.  No murmur heard. Pulmonary/Chest: Effort normal and breath sounds normal. No respiratory distress. He has no wheezes. He has no rales.  Musculoskeletal: He exhibits no edema or deformity.  Lymphadenopathy:    He has no cervical adenopathy.  Neurological: He is alert and oriented to person, place, and time.  Skin: Skin is warm and dry. No rash noted.  Psychiatric: He has a normal mood and affect. His behavior is normal.  Vitals reviewed.       Assessment & Plan:     1. Viral URI - no evidence of CAP, Strep throat, flu, or other bacterial infection - discussed natural course, symptomatic management, and return precautions  Return if symptoms worsen or fail to improve.     The entirety of the information documented in the History of Present Illness, Review of Systems and Physical Exam were personally obtained by me. Portions of this information were initially documented by Raquel Sarna Ratchford, CMA and reviewed by me for thoroughness and accuracy.    Virginia Crews, MD, MPH [facility] 12/22/2017 10:17 AM

## 2017-12-25 ENCOUNTER — Encounter: Payer: Self-pay | Admitting: Family Medicine

## 2017-12-25 ENCOUNTER — Ambulatory Visit: Payer: BLUE CROSS/BLUE SHIELD | Admitting: Family Medicine

## 2017-12-25 VITALS — BP 136/72 | HR 86 | Temp 98.1°F | Resp 18 | Wt 286.0 lb

## 2017-12-25 DIAGNOSIS — J029 Acute pharyngitis, unspecified: Secondary | ICD-10-CM

## 2017-12-25 DIAGNOSIS — J069 Acute upper respiratory infection, unspecified: Secondary | ICD-10-CM

## 2017-12-25 DIAGNOSIS — H1032 Unspecified acute conjunctivitis, left eye: Secondary | ICD-10-CM

## 2017-12-25 DIAGNOSIS — J40 Bronchitis, not specified as acute or chronic: Secondary | ICD-10-CM

## 2017-12-25 LAB — POCT RAPID STREP A (OFFICE): Rapid Strep A Screen: NEGATIVE

## 2017-12-25 MED ORDER — AZITHROMYCIN 250 MG PO TABS: ORAL_TABLET | ORAL | 0 refills | Status: DC

## 2017-12-25 MED ORDER — CIPROFLOXACIN HCL 0.3 % OP SOLN: 1.0000 [drp] | OPHTHALMIC | 0 refills | Status: DC

## 2017-12-25 NOTE — Progress Notes (Signed)
Patient: Justin Soto Male    DOB: 04/09/1961   57 y.o.   MRN: [id number] Visit Date: 12/25/2017  Today's Provider: [name], MD   Chief Complaint  Patient presents with  . Sore Throat  . Sinusitis   Subjective:    HPI Pt is her today because he has a sore throat, congestion, cough. He was sen by Dr. B last week and she it was viral but pt reports that he got worse over the weekend. He now has pus pockets on the back of his throat and feels like his chest is tightening up. He has some shortness of breath, sputum productive ( grayish color) he has pain/pressure in his left ear. He also has pain, redness and discharge in his left eye. He woke up this morning and was matted. He thought he had something in his eye and tried to get something out but could not.       Allergies  Allergen Reactions  . Methocarbamol Other (See Comments)    Numbness, and change of taste   . Vicodin [Hydrocodone-Acetaminophen] Itching    Has to use benedryl  . Tizanidine Nausea Only     Current Outpatient Medications:  .  amLODipine-benazepril (LOTREL) 10-40 MG capsule, Take 1 capsule by mouth daily., Disp: 90 capsule, Rfl: 3 .  aspirin EC 81 MG tablet, Take 81 mg by mouth daily., Disp: , Rfl:  .  cetirizine (ZYRTEC) 10 MG tablet, 1 to 2 tablets daily as needed (Patient taking differently: Take 10 mg by mouth daily. ), Disp: 60 tablet, Rfl: 12 .  finasteride (PROSCAR) 5 MG tablet, Take 1 tablet (5 mg total) by mouth daily., Disp: 90 tablet, Rfl: 4 .  fluticasone (FLONASE) 50 MCG/ACT nasal spray, Place 2 sprays into both nostrils daily. (Patient taking differently: Place 2 sprays into both nostrils daily as needed. ), Disp: 16 g, Rfl: 12 .  hydrochlorothiazide (HYDRODIURIL) 25 MG tablet, Take 1 tablet (25 mg total) by mouth daily., Disp: 90 tablet, Rfl: 1 .  meloxicam (MOBIC) 15 MG tablet, Take 1 tablet (15 mg total) by mouth daily., Disp: 30 tablet, Rfl: 0 .  metoprolol tartrate  (LOPRESSOR) 25 MG tablet, Take 1 tablet (25 mg total) by mouth 2 (two) times daily. (Patient taking differently: Take 25 mg by mouth daily. ), Disp: 180 tablet, Rfl: 3 .  montelukast (SINGULAIR) 10 MG tablet, Take 1 tablet (10 mg total) by mouth every morning., Disp: 90 tablet, Rfl: 1 .  Multiple Vitamin (MULTIVITAMIN WITH MINERALS) TABS tablet, Take 1 tablet by mouth daily., Disp: , Rfl:  .  omeprazole (PRILOSEC) 10 MG capsule, Take 10 mg by mouth daily., Disp: , Rfl:  .  tamsulosin (FLOMAX) 0.4 MG CAPS capsule, TAKE TWO CAPSULES BY MOUTH EACH DAY AS DIRECTED, Disp: 60 capsule, Rfl: 6  Review of Systems  Constitutional: Positive for fatigue.  HENT: Positive for congestion, ear pain, mouth sores, postnasal drip, rhinorrhea, sinus pressure, sinus pain and sore throat.   Eyes: Positive for pain, discharge, redness and itching.  Respiratory: Positive for cough, chest tightness and shortness of breath.   Cardiovascular: Negative.   Gastrointestinal: Negative.   Endocrine: Negative.   Genitourinary: Negative.   Musculoskeletal: Negative.   Skin: Negative.   Allergic/Immunologic: Negative.   Neurological: Negative.   Hematological: Negative.   Psychiatric/Behavioral: Negative.     Social History   Tobacco Use  . Smoking status: Never Smoker  . Smokeless tobacco: Never Used  Substance Use Topics  . Alcohol use: No   Objective:   BP 136/72 (BP Location: Left Arm, Patient Position: Sitting, Cuff Size: Large)   Pulse 86   Temp 98.1 F (36.7 C) (Oral)   Resp 18   Wt 286 lb (129.7 kg)   SpO2 98%   BMI 44.79 kg/m  Vitals:   12/25/17 1517  BP: 136/72  Pulse: 86  Resp: 18  Temp: 98.1 F (36.7 C)  TempSrc: Oral  SpO2: 98%  Weight: 286 lb (129.7 kg)     Physical Exam  Constitutional: He is oriented to person, place, and time. He appears well-developed and well-nourished.  Eyes: EOM are normal. Pupils are equal, round, and reactive to light. Right eye exhibits discharge.  Left  conjunctivitis   Neck: Normal range of motion. Neck supple.  Cardiovascular: Normal rate, regular rhythm, normal heart sounds and intact distal pulses.  Pulmonary/Chest: Effort normal and breath sounds normal.  Musculoskeletal: Normal range of motion.  Neurological: He is alert and oriented to person, place, and time. He has normal reflexes.  Skin: Skin is warm and dry.  Psychiatric: He has a normal mood and affect. His behavior is normal. Judgment and thought content normal.        Assessment & Plan:     1. Sore throat  - POCT rapid strep A negative  2. Viral URI   3. Bronchitis  - azithromycin (ZITHROMAX) 250 MG tablet; Take 2 on the first day, then once daily until finished.  Dispense: 6 tablet; Refill: 0  4. Acute conjunctivitis of left eye, unspecified acute conjunctivitis type  - ciprofloxacin (CILOXAN) 0.3 % ophthalmic solution; Place 1 drop into the left eye every 2 (two) hours. Administer 1 drop, every 2 hours, while awake, for 2 days. Then 1 drop, every 4 hours, while awake, for the next 5 days.  Dispense: 5 mL; Refill: 0      HPI, Exam, and A&P Transcribed under the direction and in the presence of Olson Lucarelli L. Cranford Mon, MD  Electronically Signed: Katina Dung, CMA  I have done the exam and reviewed the above chart and it is accurate to the best of my knowledge. Development worker, community has been used in this note in any air is in the dictation or transcription are unintentional.  [name], MD  [place]

## 2017-12-27 ENCOUNTER — Other Ambulatory Visit: Payer: Self-pay | Admitting: Family Medicine

## 2017-12-27 ENCOUNTER — Other Ambulatory Visit: Payer: Self-pay | Admitting: Emergency Medicine

## 2017-12-27 ENCOUNTER — Telehealth: Payer: Self-pay | Admitting: Family Medicine

## 2017-12-27 DIAGNOSIS — I10 Essential (primary) hypertension: Secondary | ICD-10-CM

## 2017-12-27 DIAGNOSIS — H1032 Unspecified acute conjunctivitis, left eye: Secondary | ICD-10-CM

## 2017-12-27 MED ORDER — SULFACETAMIDE SODIUM 10 % OP SOLN: 1.0000 [drp] | OPHTHALMIC | 0 refills | Status: DC

## 2017-12-27 NOTE — Telephone Encounter (Signed)
Patient states that [facility] and [facility] did not have ciprofloxacin (CILOXAN) 0.3 % ophthalmic solution.  He would like something comparable to this sent into [facility] in [place].

## 2017-12-27 NOTE — Telephone Encounter (Signed)
Please advise-Gabbi Whetstone V Ji Fairburn, RMA  

## 2018-01-09 ENCOUNTER — Telehealth: Payer: Self-pay | Admitting: Family Medicine

## 2018-01-09 ENCOUNTER — Other Ambulatory Visit: Payer: Self-pay

## 2018-01-09 DIAGNOSIS — J40 Bronchitis, not specified as acute or chronic: Secondary | ICD-10-CM

## 2018-01-09 MED ORDER — AZITHROMYCIN 250 MG PO TABS: ORAL_TABLET | ORAL | 0 refills | Status: DC

## 2018-01-09 NOTE — Telephone Encounter (Signed)
Pt stated he saw Dr. Rosanna Randy for [place] on 12/25/17 and was given azithromycin (ZITHROMAX) 250 MG tablet. Pt stated that he had started to get better but he now is have the same symptoms again. Pt stated that he is supposed to go out of town and would like to get another round of azithromycin (ZITHROMAX) 250 MG tablet sent to [facility]. Please advise. Thanks TNP

## 2018-01-09 NOTE — Telephone Encounter (Signed)
Please advise-Anastasiya V Hopkins, RMA  

## 2018-01-09 NOTE — Telephone Encounter (Signed)
done

## 2018-01-09 NOTE — Telephone Encounter (Signed)
ok 

## 2018-01-10 ENCOUNTER — Ambulatory Visit: Payer: BLUE CROSS/BLUE SHIELD | Admitting: Urology

## 2018-01-16 ENCOUNTER — Encounter: Payer: Self-pay | Admitting: Urology

## 2018-01-16 ENCOUNTER — Ambulatory Visit (INDEPENDENT_AMBULATORY_CARE_PROVIDER_SITE_OTHER): Payer: BLUE CROSS/BLUE SHIELD | Admitting: Urology

## 2018-01-16 VITALS — BP 136/70 | HR 91 | Ht 67.0 in | Wt 280.0 lb

## 2018-01-16 DIAGNOSIS — B3742 Candidal balanitis: Secondary | ICD-10-CM | POA: Diagnosis not present

## 2018-01-16 DIAGNOSIS — N475 Adhesions of prepuce and glans penis: Secondary | ICD-10-CM

## 2018-01-16 MED ORDER — NYSTATIN-TRIAMCINOLONE 100000-0.1 UNIT/GM-% EX OINT: 1.0000 "application " | TOPICAL_OINTMENT | Freq: Two times a day (BID) | CUTANEOUS | 0 refills | Status: DC

## 2018-01-16 NOTE — Progress Notes (Signed)
01/16/2018 11:20 AM   Justin Soto 1961/01/17 [id number]  Referring provider: Jerrol Banana., MD [address] [place], [place] [address]  Chief Complaint  Patient presents with  . Post-op Follow-up    4wk    HPI: 57 year old male with buried penis/penile adhesions was taken to the operating room on 12/13/17 for cystoscopy, lysis of penile adhesions.  Intraoperatively, the lesions were noted to be fairly significant involving greater than 75% of the coronal margin.  These were taken down bluntly and sharply intraoperatively.  There is no significant redundant foreskin for resection.  Initially following the surgery, he kept the area Vaseline was diligent about pulling back his skin to avoid re-adhesion.  Over the past approximately 2 weeks, he has had an area at the right lateral coronal margin which has become sore, red, with recurrent adhesive disease.  He is unable to break these adhesions.  He has had significant balanitis in the past and responded well to oral fluconazole as well as topical cream.  Over the past several years, he has gained a significant amount of weight secondary to prednisone and orthopedic injuries.   PMH: Past Medical History:  Diagnosis Date  . Anemia   . Enlarged prostate    hx of  . Environmental and seasonal allergies   . Frequency of urination   . GERD (gastroesophageal reflux disease)   . History of kidney stones   . HOH (hard of hearing)    wears bilateral hearing aids  . Hypertension   . Irregular heart beat    Pt feels this every once in a while; PCP aware but stated it was OK  . Kidney stone on right side 01/15/2017  . Leg pain, right    Take Lyrica and Naproxen  . Neck pain of over 3 months duration   . Sleep apnea    NO CPAP  . Tick fever     Surgical History: Past Surgical History:  Procedure Laterality Date  . ANTERIOR CERVICAL DECOMP/DISCECTOMY FUSION N/A 11/13/2014   Procedure: Cervical three-four,  Cervical six-seven anterior cervical decompression with fusion interbody prosthesis plating and bonegraft;  Surgeon: Newman Pies, MD;  Location: [facility];  Service: Neurosurgery;  Laterality: N/A;  Cervical three-four, Cervical six-seven anterior cervical decompression with fusion interbody prosthesis plating and bonegraft  . BACK SURGERY     Lumbar X 2  . CALCANEAL OSTEOTOMY Right 01/11/2017   Procedure: CALCANEAL OSTEOTOMY;  Surgeon: Earnestine Leys, MD;  Location: [facility];  Service: Orthopedics;  Laterality: Right;  . CIRCUMCISION N/A 12/13/2017   Procedure: PENILE BLOCK, LYSIS OF PENILE ADHESIONS;  Surgeon: Hollice Espy, MD;  Location: [facility];  Service: Urology;  Laterality: N/A;  . COLONOSCOPY    . CYSTOSCOPY N/A 12/13/2017   Procedure: CYSTOSCOPY;  Surgeon: Hollice Espy, MD;  Location: [facility];  Service: Urology;  Laterality: N/A;  . HERNIA REPAIR Left    groin  . LEG SURGERY Right    Had surgery on right calf X 5  . LITHOTRIPSY    . QUADRICEPS TENDON REPAIR Left 06/22/2015   Procedure: REPAIR QUADRICEP TENDON;  Surgeon: Earnestine Leys, MD;  Location: [facility];  Service: Orthopedics;  Laterality: Left;  Marland Kitchen QUADRICEPS TENDON REPAIR Left 08/12/2015   Procedure: REPAIR QUADRICEP TENDON;  Surgeon: Earnestine Leys, MD;  Location: [facility];  Service: Orthopedics;  Laterality: Left;  . ROTATOR CUFF REPAIR Left   . TONSILLECTOMY    . VASECTOMY     with hydrocele repaired  Home Medications:  Allergies as of 01/16/2018      Reactions   Methocarbamol Other (See Comments)   Numbness, and change of taste    Vicodin [hydrocodone-acetaminophen] Itching   Has to use benedryl   Tizanidine Nausea Only      Medication List        Accurate as of 01/16/18 11:20 AM. Always use your most recent med list.          amLODipine-benazepril 10-40 MG capsule Commonly known as:  LOTREL Take 1 capsule by mouth daily.   aspirin EC 81 MG tablet Take 81 mg by mouth daily.   cetirizine 10 MG  tablet Commonly known as:  ZYRTEC 1 to 2 tablets daily as needed   finasteride 5 MG tablet Commonly known as:  PROSCAR Take 1 tablet (5 mg total) by mouth daily.   fluticasone 50 MCG/ACT nasal spray Commonly known as:  FLONASE Place 2 sprays into both nostrils daily.   hydrochlorothiazide 25 MG tablet Commonly known as:  HYDRODIURIL TAKE ONE TABLET BY MOUTH EVERY DAY   meloxicam 15 MG tablet Commonly known as:  MOBIC Take 1 tablet (15 mg total) by mouth daily.   metoprolol tartrate 25 MG tablet Commonly known as:  LOPRESSOR Take 1 tablet (25 mg total) by mouth 2 (two) times daily.   montelukast 10 MG tablet Commonly known as:  SINGULAIR TAKE ONE TABLET BY MOUTH EVERY MORNING   multivitamin with minerals Tabs tablet Take 1 tablet by mouth daily.   omeprazole 10 MG capsule Commonly known as:  PRILOSEC Take 10 mg by mouth daily.   sulfacetamide 10 % ophthalmic solution Commonly known as:  BLEPH-10 Place 1 drop into the left eye every 3 (three) hours.   tamsulosin 0.4 MG Caps capsule Commonly known as:  FLOMAX TAKE TWO CAPSULES BY MOUTH EACH DAY AS DIRECTED       Allergies:  Allergies  Allergen Reactions  . Methocarbamol Other (See Comments)    Numbness, and change of taste   . Vicodin [Hydrocodone-Acetaminophen] Itching    Has to use benedryl  . Tizanidine Nausea Only    Family History: Family History  Problem Relation Age of Onset  . Prostate cancer Father   . Heart disease Father   . Diabetes Father   . Alzheimer's disease Father   . Anemia Mother   . Osteoporosis Mother   . Diabetes Brother   . Hypertension Brother   . Anemia Daughter   . Kidney disease Neg Hx   . Bladder Cancer Neg Hx     Social History:  reports that  has never smoked. he has never used smokeless tobacco. He reports that he does not drink alcohol or use drugs.  ROS: UROLOGY Frequent Urination?: No Hard to postpone urination?: No Burning/pain with urination?: No Get up  at night to urinate?: No Leakage of urine?: No Urine stream starts and stops?: No Trouble starting stream?: No Do you have to strain to urinate?: No Blood in urine?: No Urinary tract infection?: No Sexually transmitted disease?: No Injury to kidneys or bladder?: No Painful intercourse?: No Weak stream?: No Erection problems?: No Penile pain?: No  Gastrointestinal Nausea?: No Vomiting?: No Indigestion/heartburn?: No Diarrhea?: No Constipation?: No  Constitutional Fever: No Night sweats?: No Weight loss?: No Fatigue?: No  Skin Skin rash/lesions?: No Itching?: No  Eyes Blurred vision?: No Double vision?: No  Ears/Nose/Throat Sore throat?: No Sinus problems?: No  Hematologic/Lymphatic Swollen glands?: No Easy bruising?: No  Cardiovascular Leg  swelling?: No Chest pain?: No  Respiratory Cough?: No Shortness of breath?: No  Endocrine Excessive thirst?: No  Musculoskeletal Back pain?: No Joint pain?: No  Neurological Headaches?: No Dizziness?: No  Psychologic Depression?: No Anxiety?: No  Physical Exam: BP 136/70   Pulse 91   Ht 5\' 7"  (1.702 m)   Wt 280 lb (127 kg)   BMI 43.85 kg/m   Constitutional:  Alert and oriented, No acute distress. HEENT: [place] AT, moist mucus membranes.  Trachea midline, no masses. Cardiovascular: No clubbing, cyanosis, or edema. Respiratory: Normal respiratory effort, no increased work of breathing. GI: Abdomen is soft, nontender, nondistended, no abdominal masses, protuberant and obese. GU: Buried phallus with significant suprapubic fat pad.  Penile adhesions noted around the right coronal margin with associated adjacent erythema of the glands just adjacent to this.  No penile discharge.  The remainder of the coronal margin unremarkable. Skin: No rashes, bruises or suspicious lesions. Neurologic: Grossly intact, no focal deficits, moving all 4 extremities. Psychiatric: Normal mood and affect.  Laboratory Data: Lab  Results  Component Value Date   WBC 4.5 12/07/2017   HGB 10.8 (L) 12/07/2017   HCT 34.4 (L) 12/07/2017   MCV 62.6 (L) 12/07/2017   PLT 220 12/07/2017    Lab Results  Component Value Date   CREATININE 0.93 12/07/2017    Lab Results  Component Value Date   PSA1 2.1 11/23/2017   PSA1 3.2 09/09/2015    Lab Results  Component Value Date   HGBA1C 5.6 08/09/2017    Urinalysis Lab Results  Component Value Date   SPECGRAV 1.025 11/23/2017   PHUR 5.5 11/23/2017   COLORU Yellow 11/23/2017   APPEARANCEUR Clear 11/23/2017   LEUKOCYTESUR Negative 11/23/2017   PROTEINUR Negative 11/23/2017   GLUCOSEU Negative 11/23/2017   KETONESU Trace (A) 11/23/2017   RBCU Negative 11/23/2017   BILIRUBINUR Negative 11/23/2017   UUROB 0.2 11/23/2017   NITRITE Negative 11/23/2017    Lab Results  Component Value Date   LABMICR See below: 11/23/2017   WBCUA 0-5 11/23/2017   RBCUA None seen 11/23/2017   LABEPIT None seen 11/23/2017   MUCUS Present (A) 11/23/2017   BACTERIA Few (A) 11/23/2017    Pertinent Imaging: N/A  Assessment & Plan:    1. Penile adhesions Significant penile adhesions status post lysis of adhesions in the operating room  Recurrence along right coronal margin Recommended warm bath to loosen skin with mechanical lysis as tolerated No role for circumcision given that he has already been circumcised and he has no redundant foreskin Significant buried penis as contributing factor-encouraged weight loss  2. Candidal balanitis area of erythema adjacent to recurrent penile adhesion inflamed and possibly consistent with candidal balanitis We will treat with topical steroid/antifungal cream twice daily with reassessment in 1 month If fails to improve, I will will prescribe oral fluconazole as he previously did well with this   Return in about 4 weeks (around 02/13/2018) for wound check.  Hollice Espy, MD  [facility] [address], [place], [place] [address] [phone]

## 2018-01-25 DIAGNOSIS — H9193 Unspecified hearing loss, bilateral: Secondary | ICD-10-CM | POA: Diagnosis not present

## 2018-01-25 DIAGNOSIS — Z6841 Body Mass Index (BMI) 40.0 and over, adult: Secondary | ICD-10-CM | POA: Diagnosis not present

## 2018-01-26 ENCOUNTER — Other Ambulatory Visit: Payer: Self-pay | Admitting: Urology

## 2018-01-30 ENCOUNTER — Ambulatory Visit: Payer: BLUE CROSS/BLUE SHIELD | Admitting: Family Medicine

## 2018-01-30 VITALS — BP 140/62 | HR 102 | Temp 100.6°F | Resp 19 | Wt 291.0 lb

## 2018-01-30 DIAGNOSIS — R059 Cough, unspecified: Secondary | ICD-10-CM

## 2018-01-30 DIAGNOSIS — R05 Cough: Secondary | ICD-10-CM | POA: Diagnosis not present

## 2018-01-30 DIAGNOSIS — R6889 Other general symptoms and signs: Secondary | ICD-10-CM | POA: Diagnosis not present

## 2018-01-30 DIAGNOSIS — J111 Influenza due to unidentified influenza virus with other respiratory manifestations: Secondary | ICD-10-CM | POA: Diagnosis not present

## 2018-01-30 DIAGNOSIS — K429 Umbilical hernia without obstruction or gangrene: Secondary | ICD-10-CM | POA: Diagnosis not present

## 2018-01-30 LAB — POCT INFLUENZA A/B
Influenza A, POC: NEGATIVE
Influenza B, POC: NEGATIVE

## 2018-01-30 MED ORDER — BALOXAVIR MARBOXIL(80 MG DOSE) 2 X 40 MG PO TBPK: 80.0000 mg | ORAL_TABLET | Freq: Every day | ORAL | 0 refills | Status: DC

## 2018-01-30 NOTE — Progress Notes (Signed)
Justin Soto  MRN: [id number] DOB: [birth year]  Subjective:  HPI   The patient is a 57 year old male who presents for evaluation of flu like symptoms.  He states that 2 days ago he started having a cough and it progressed into a worse cough yesterday and fever with body aches during the night.  He states he has had fever of 100 and feels the worst he has ever felt.   Patient did not get flu vaccine.   Patient also states that he noticed 5 days ago when he was going to bed he noticed a lump at [employer].  When he touched it he said that it caused a shooting pain all the through his abdomen.  On Saturday morning the lump was gone but the soreness was still there.  He reports that now the lump comes and goes but is still tender.   Patient Active Problem List   Diagnosis Date Noted  . Kidney stone on right side 01/15/2017  . OSA (obstructive sleep apnea) 11/08/2016  . Hyperglycemia 07/26/2016  . ADD (attention deficit disorder) 09/08/2015  . Absolute anemia 09/08/2015  . Achilles bursitis 09/08/2015  . Back pain, thoracic 09/08/2015  . Benign prostatic hypertrophy with lower urinary tract symptoms (LUTS) 09/08/2015  . Cellulitis 09/08/2015  . Clinical depression 09/08/2015  . Deep vein thrombosis ([place]) 09/08/2015  . Acid reflux 09/08/2015  . Bulge of cervical disc without myelopathy 09/08/2015  . 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 BP (high blood pressure) 09/08/2015  . Cannot sleep 09/08/2015  . Anemia, iron deficiency 09/08/2015  . Bad memory 09/08/2015  . Adiposity 09/08/2015  . Allergic rhinitis, seasonal 09/08/2015  . 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 Fast heart beat 09/08/2015  . Cervical spondylosis with myelopathy and radiculopathy 11/13/2014  . Hernia, inguinal, unilateral 04/06/2009    Past Medical History:  Diagnosis Date  . Anemia   . Enlarged prostate    hx of  . Environmental and seasonal allergies     . Frequency of urination   . GERD (gastroesophageal reflux disease)   . History of kidney stones   . HOH (hard of hearing)    wears bilateral hearing aids  . Hypertension   . Irregular heart beat    Pt feels this every once in a while; PCP aware but stated it was OK  . Kidney stone on right side 01/15/2017  . Leg pain, right    Take Lyrica and Naproxen  . Neck pain of over 3 months duration   . Sleep apnea    NO CPAP  . Tick fever     Social History   Socioeconomic History  . Marital status: Married    Spouse name: Not on file  . Number of children: Not on file  . Years of education: Not on file  . Highest education level: Not on file  Social Needs  . Financial resource strain: Not on file  . Food insecurity - worry: Not on file  . Food insecurity - inability: Not on file  . Transportation needs - medical: Not on file  . Transportation needs - non-medical: Not on file  Occupational History  . Not on file  Tobacco Use  . Smoking status: Never Smoker  . Smokeless tobacco: Never Used  Substance and Sexual Activity  . Alcohol use: No  . Drug use: No  . Sexual activity: Not on file  Other Topics  Concern  . Not on file  Social History Narrative  . Not on file    Outpatient Encounter Medications as of 01/30/2018  Medication Sig  . amLODipine-benazepril (LOTREL) 10-40 MG capsule Take 1 capsule by mouth daily.  Marland Kitchen aspirin EC 81 MG tablet Take 81 mg by mouth daily.  . cetirizine (ZYRTEC) 10 MG tablet 1 to 2 tablets daily as needed (Patient taking differently: Take 10 mg by mouth daily. )  . finasteride (PROSCAR) 5 MG tablet TAKE ONE (1) TABLET BY MOUTH EVERY DAY  . fluticasone (FLONASE) 50 MCG/ACT nasal spray Place 2 sprays into both nostrils daily. (Patient taking differently: Place 2 sprays into both nostrils daily as needed. )  . hydrochlorothiazide (HYDRODIURIL) 25 MG tablet TAKE ONE TABLET BY MOUTH EVERY DAY  . meloxicam (MOBIC) 15 MG tablet Take 1 tablet (15 mg total)  by mouth daily.  . metoprolol tartrate (LOPRESSOR) 25 MG tablet Take 1 tablet (25 mg total) by mouth 2 (two) times daily. (Patient taking differently: Take 25 mg by mouth daily. )  . montelukast (SINGULAIR) 10 MG tablet TAKE ONE TABLET BY MOUTH EVERY MORNING  . Multiple Vitamin (MULTIVITAMIN WITH MINERALS) TABS tablet Take 1 tablet by mouth daily.  Marland Kitchen nystatin-triamcinolone ointment (MYCOLOG) Apply 1 application topically 2 (two) times daily.  Marland Kitchen omeprazole (PRILOSEC) 10 MG capsule Take 10 mg by mouth daily.  Marland Kitchen sulfacetamide (BLEPH-10) 10 % ophthalmic solution Place 1 drop into the left eye every 3 (three) hours.  . tamsulosin (FLOMAX) 0.4 MG CAPS capsule TAKE TWO CAPSULES BY MOUTH EACH DAY AS DIRECTED   No facility-administered encounter medications on file as of 01/30/2018.     Allergies  Allergen Reactions  . Methocarbamol Other (See Comments)    Numbness, and change of taste   . Vicodin [Hydrocodone-Acetaminophen] Itching    Has to use benedryl  . Tizanidine Nausea Only    Review of Systems  Constitutional: Positive for fever and malaise/fatigue.  HENT: Positive for congestion and sinus pain. Negative for sore throat.   Respiratory: Positive for cough, shortness of breath and wheezing. Negative for sputum production.   Gastrointestinal: Negative.   Neurological: Positive for weakness and headaches.  Endo/Heme/Allergies: Negative.   Psychiatric/Behavioral: Negative.     Objective:  BP 140/62 (BP Location: Right Arm, Patient Position: Sitting, Cuff Size: Normal)   Pulse (!) 102   Temp (!) 100.6 F (38.1 C)   Resp 19   Wt 291 lb (132 kg)   SpO2 99%   BMI 45.58 kg/m   Physical Exam  Constitutional: He is oriented to person, place, and time and well-developed, well-nourished, and in no distress.  HENT:  Head: Normocephalic and atraumatic.  Eyes: Conjunctivae are normal. Pupils are equal, round, and reactive to light. No scleral icterus.  Neck: Normal range of motion. No  thyromegaly present.  Cardiovascular: Regular rhythm and normal heart sounds.  Pulmonary/Chest: Effort normal and breath sounds normal.  Abdominal: Soft. Bowel sounds are normal.  Umbilical hernia   Neurological: He is alert and oriented to person, place, and time.  Skin: Skin is warm and dry.  Psychiatric: Mood, memory, affect and judgment normal.    Assessment and Plan :  1. Flu-like symptoms F/u if he worsens or does not improve. - POCT Influenza A/B  2. Umbilical hernia without obstruction and without gangrene  - Ambulatory referral to General Surgery  3. Cough   4. Influenza Clinically will Rx with Xofluxa. - Baloxavir Marboxil 80 MG Dose (XOFLUZA) 40 (  2) MG TBPK; Take 80 mg by mouth daily.  Dispense: 1 each; Refill: 0  I have done the exam and reviewed the chart and it is accurate to the best of my knowledge. Development worker, community has been used and  any errors in dictation or transcription are unintentional. Miguel Aschoff M.D. [facility]

## 2018-02-01 ENCOUNTER — Ambulatory Visit: Payer: BLUE CROSS/BLUE SHIELD | Admitting: Family Medicine

## 2018-02-01 VITALS — BP 132/82 | HR 82 | Temp 99.3°F | Resp 16

## 2018-02-01 DIAGNOSIS — R599 Enlarged lymph nodes, unspecified: Secondary | ICD-10-CM

## 2018-02-01 DIAGNOSIS — K1121 Acute sialoadenitis: Secondary | ICD-10-CM

## 2018-02-01 MED ORDER — AMOXICILLIN 500 MG PO CAPS: 1000.0000 mg | ORAL_CAPSULE | Freq: Two times a day (BID) | ORAL | 0 refills | Status: DC

## 2018-02-01 NOTE — Progress Notes (Deleted)
       Patient: EXCELL NEYLAND Male    DOB: Jan 13, 1961   57 y.o.   MRN: [id number] Visit Date: 02/01/2018  Today's Provider: Vernie Murders, PA   No chief complaint on file.  Subjective:    HPI Patient presents today with concerns of a "knot" appearing behind his left ear radiating to the jaw area. He reports noticing  Patient was seen on 01/30/18 and diagnosed with flu-like symptoms. He started Xofluza 80 mg.     Allergies  Allergen Reactions  . Methocarbamol Other (See Comments)    Numbness, and change of taste   . Vicodin [Hydrocodone-Acetaminophen] Itching    Has to use benedryl  . Tizanidine Nausea Only     Current Outpatient Medications:  .  amLODipine-benazepril (LOTREL) 10-40 MG capsule, Take 1 capsule by mouth daily., Disp: 90 capsule, Rfl: 3 .  aspirin EC 81 MG tablet, Take 81 mg by mouth daily., Disp: , Rfl:  .  Baloxavir Marboxil 80 MG Dose (XOFLUZA) 40 (2) MG TBPK, Take 80 mg by mouth daily., Disp: 1 each, Rfl: 0 .  cetirizine (ZYRTEC) 10 MG tablet, 1 to 2 tablets daily as needed (Patient taking differently: Take 10 mg by mouth daily. ), Disp: 60 tablet, Rfl: 12 .  finasteride (PROSCAR) 5 MG tablet, TAKE ONE (1) TABLET BY MOUTH EVERY DAY, Disp: 90 tablet, Rfl: 3 .  fluticasone (FLONASE) 50 MCG/ACT nasal spray, Place 2 sprays into both nostrils daily. (Patient taking differently: Place 2 sprays into both nostrils daily as needed. ), Disp: 16 g, Rfl: 12 .  hydrochlorothiazide (HYDRODIURIL) 25 MG tablet, TAKE ONE TABLET BY MOUTH EVERY DAY, Disp: 90 tablet, Rfl: 3 .  meloxicam (MOBIC) 15 MG tablet, Take 1 tablet (15 mg total) by mouth daily., Disp: 30 tablet, Rfl: 0 .  metoprolol tartrate (LOPRESSOR) 25 MG tablet, Take 1 tablet (25 mg total) by mouth 2 (two) times daily. (Patient taking differently: Take 25 mg by mouth daily. ), Disp: 180 tablet, Rfl: 3 .  montelukast (SINGULAIR) 10 MG tablet, TAKE ONE TABLET BY MOUTH EVERY MORNING, Disp: 90 tablet, Rfl: 3 .  Multiple  Vitamin (MULTIVITAMIN WITH MINERALS) TABS tablet, Take 1 tablet by mouth daily., Disp: , Rfl:  .  nystatin-triamcinolone ointment (MYCOLOG), Apply 1 application topically 2 (two) times daily., Disp: 30 g, Rfl: 0 .  omeprazole (PRILOSEC) 10 MG capsule, Take 10 mg by mouth daily., Disp: , Rfl:  .  sulfacetamide (BLEPH-10) 10 % ophthalmic solution, Place 1 drop into the left eye every 3 (three) hours., Disp: 15 mL, Rfl: 0 .  tamsulosin (FLOMAX) 0.4 MG CAPS capsule, TAKE TWO CAPSULES BY MOUTH EACH DAY AS DIRECTED, Disp: 60 capsule, Rfl: 6  Review of Systems  Constitutional: Negative.   Respiratory: Negative.   Cardiovascular: Negative.     Social History   Tobacco Use  . Smoking status: Never Smoker  . Smokeless tobacco: Never Used  Substance Use Topics  . Alcohol use: No   Objective:   There were no vitals taken for this visit.   Physical Exam      Assessment & Plan:           Vernie Murders, PA  [facility]

## 2018-02-01 NOTE — Progress Notes (Signed)
Justin Soto  MRN: [id number] DOB: [birth year]  Subjective:  HPI   The patient is a 57 year old male who presents today with swollen gland on the left side of his neck.  He was seen 2 days ago with flu like symptoms but negative flu test.  He was treated with the new antiviral Xofluza.  He states he had taken them on two days ago after the visit.    This morning when he first got up he said things were ok.  He started coughing and then noticed the left side of his face at the jaw line started to swell.  He reports that it continued to get bigger throughout the day and it is difficult for his to chew.  He has not been able to eat anything all day. He states it is tender to the touch and he has been nauseated during the day.  Patient Active Problem List   Diagnosis Date Noted  . Kidney stone on right side 01/15/2017  . OSA (obstructive sleep apnea) 11/08/2016  . Hyperglycemia 07/26/2016  . ADD (attention deficit disorder) 09/08/2015  . Absolute anemia 09/08/2015  . Achilles bursitis 09/08/2015  . Back pain, thoracic 09/08/2015  . Benign prostatic hypertrophy with lower urinary tract symptoms (LUTS) 09/08/2015  . Cellulitis 09/08/2015  . Clinical depression 09/08/2015  . Deep vein thrombosis ([place]) 09/08/2015  . Acid reflux 09/08/2015  . Bulge of cervical disc without myelopathy 09/08/2015  . 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 BP (high blood pressure) 09/08/2015  . Cannot sleep 09/08/2015  . Anemia, iron deficiency 09/08/2015  . Bad memory 09/08/2015  . Obesity, Class III, BMI 40-49.9 (morbid obesity) ([place]) 09/08/2015  . Allergic rhinitis, seasonal 09/08/2015  . 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 Fast heart beat 09/08/2015  . Cervical spondylosis with myelopathy and radiculopathy 11/13/2014  . Hernia, inguinal, unilateral 04/06/2009    Past Medical History:  Diagnosis Date  . Anemia   . Enlarged prostate    hx of    . Environmental and seasonal allergies   . Frequency of urination   . GERD (gastroesophageal reflux disease)   . History of kidney stones   . HOH (hard of hearing)    wears bilateral hearing aids  . Hypertension   . Irregular heart beat    Pt feels this every once in a while; PCP aware but stated it was OK  . Kidney stone on right side 01/15/2017  . Leg pain, right    Take Lyrica and Naproxen  . Neck pain of over 3 months duration   . Sleep apnea    NO CPAP  . Tick fever     Social History   Socioeconomic History  . Marital status: Married    Spouse name: Not on file  . Number of children: Not on file  . Years of education: Not on file  . Highest education level: Not on file  Social Needs  . Financial resource strain: Not on file  . Food insecurity - worry: Not on file  . Food insecurity - inability: Not on file  . Transportation needs - medical: Not on file  . Transportation needs - non-medical: Not on file  Occupational History  . Not on file  Tobacco Use  . Smoking status: Never Smoker  . Smokeless tobacco: Never Used  Substance and Sexual Activity  . Alcohol use: No  . Drug use: No  .  Sexual activity: Not on file  Other Topics Concern  . Not on file  Social History Narrative  . Not on file    Outpatient Encounter Medications as of 02/01/2018  Medication Sig  . amLODipine-benazepril (LOTREL) 10-40 MG capsule Take 1 capsule by mouth daily.  Marland Kitchen aspirin EC 81 MG tablet Take 81 mg by mouth daily.  . Baloxavir Marboxil 80 MG Dose (XOFLUZA) 40 (2) MG TBPK Take 80 mg by mouth daily.  . cetirizine (ZYRTEC) 10 MG tablet 1 to 2 tablets daily as needed (Patient taking differently: Take 10 mg by mouth daily. )  . finasteride (PROSCAR) 5 MG tablet TAKE ONE (1) TABLET BY MOUTH EVERY DAY  . fluticasone (FLONASE) 50 MCG/ACT nasal spray Place 2 sprays into both nostrils daily. (Patient taking differently: Place 2 sprays into both nostrils daily as needed. )  .  hydrochlorothiazide (HYDRODIURIL) 25 MG tablet TAKE ONE TABLET BY MOUTH EVERY DAY  . meloxicam (MOBIC) 15 MG tablet Take 1 tablet (15 mg total) by mouth daily.  . metoprolol tartrate (LOPRESSOR) 25 MG tablet Take 1 tablet (25 mg total) by mouth 2 (two) times daily. (Patient taking differently: Take 25 mg by mouth daily. )  . montelukast (SINGULAIR) 10 MG tablet TAKE ONE TABLET BY MOUTH EVERY MORNING  . Multiple Vitamin (MULTIVITAMIN WITH MINERALS) TABS tablet Take 1 tablet by mouth daily.  Marland Kitchen nystatin-triamcinolone ointment (MYCOLOG) Apply 1 application topically 2 (two) times daily.  Marland Kitchen omeprazole (PRILOSEC) 10 MG capsule Take 10 mg by mouth daily.  Marland Kitchen sulfacetamide (BLEPH-10) 10 % ophthalmic solution Place 1 drop into the left eye every 3 (three) hours.  . tamsulosin (FLOMAX) 0.4 MG CAPS capsule TAKE TWO CAPSULES BY MOUTH EACH DAY AS DIRECTED   No facility-administered encounter medications on file as of 02/01/2018.     Allergies  Allergen Reactions  . Methocarbamol Other (See Comments)    Numbness, and change of taste   . Vicodin [Hydrocodone-Acetaminophen] Itching    Has to use benedryl  . Tizanidine Nausea Only    Review of Systems  Constitutional: Positive for fever and malaise/fatigue.  HENT: Positive for ear pain. Negative for congestion, ear discharge, sinus pain and sore throat.   Eyes: Negative.   Respiratory: Negative.   Cardiovascular: Negative.   Gastrointestinal: Negative.   Neurological: Positive for weakness.  Psychiatric/Behavioral: Negative.     Objective:  BP 132/82 (BP Location: Left Arm, Patient Position: Sitting, Cuff Size: Large)   Pulse 82   Temp 99.3 F (37.4 C) (Oral)   Resp 16   SpO2 97%   Physical Exam  Constitutional: He is oriented to person, place, and time and well-developed, well-nourished, and in no distress.  HENT:  Head: Normocephalic and atraumatic.  Right Ear: External ear normal.  Left Ear: External ear normal.  Nose: Nose normal.    Mouth/Throat: Oropharynx is clear and moist.  Moderate Swelling with mild tenderness of left parotid gland.  Eyes: Conjunctivae are normal. No scleral icterus.  Neck: No thyromegaly present.  Cardiovascular: Normal rate, regular rhythm and normal heart sounds.  Pulmonary/Chest: Effort normal and breath sounds normal.  Abdominal: Soft.  Lymphadenopathy:    He has no cervical adenopathy.  Neurological: He is alert and oriented to person, place, and time. Gait normal. GCS score is 15.  Skin: Skin is warm and dry.  Psychiatric: Mood, memory, affect and judgment normal.    Assessment and Plan :  1. Glands swollen Simple cellulitis /parotitis vs mumps. - CBC with  Differential/Platelet - Amylase - Heterophile ab, reflex to titer, blood - amoxicillin (AMOXIL) 500 MG capsule; Take 2 capsules (1,000 mg total) by mouth 2 (two) times daily.  Dispense: 28 capsule; Refill: 0 - Mumps Antibody, IgM - Measles/Mumps/Rubella Immunity  I have done the exam and reviewed the chart and it is accurate to the best of my knowledge. Development worker, community has been used and  any errors in dictation or transcription are unintentional. Miguel Aschoff M.D. [facility]

## 2018-02-02 LAB — MONO QUAL W/RFLX QN: Mono Qual W/Rflx Qn: NEGATIVE

## 2018-02-02 LAB — CBC WITH DIFFERENTIAL/PLATELET
Basophils Absolute: 0 10*3/uL (ref 0.0–0.2)
Basos: 0 %
EOS (ABSOLUTE): 0.2 10*3/uL (ref 0.0–0.4)
Eos: 6 %
Hematocrit: 30.5 % — ABNORMAL LOW (ref 37.5–51.0)
Hemoglobin: 10.4 g/dL — ABNORMAL LOW (ref 13.0–17.7)
Immature Grans (Abs): 0 10*3/uL (ref 0.0–0.1)
Immature Granulocytes: 0 %
Lymphocytes Absolute: 1.1 10*3/uL (ref 0.7–3.1)
Lymphs: 27 %
MCH: 19.9 pg — ABNORMAL LOW (ref 26.6–33.0)
MCHC: 34.1 g/dL (ref 31.5–35.7)
MCV: 58 fL — ABNORMAL LOW (ref 79–97)
Monocytes Absolute: 0.4 10*3/uL (ref 0.1–0.9)
Monocytes: 10 %
Neutrophils Absolute: 2.2 10*3/uL (ref 1.4–7.0)
Neutrophils: 57 %
Platelets: 195 10*3/uL (ref 150–379)
RBC: 5.22 x10E6/uL (ref 4.14–5.80)
RDW: 15.9 % — ABNORMAL HIGH (ref 12.3–15.4)
WBC: 3.9 10*3/uL (ref 3.4–10.8)

## 2018-02-02 LAB — AMYLASE: Amylase: 970 U/L (ref 31–124)

## 2018-02-03 LAB — MEASLES/MUMPS/RUBELLA IMMUNITY
MUMPS ABS, IGG: 92.2 AU/mL (ref >10.9)
RUBEOLA AB, IGG: >300 AU/mL (ref >29.9)
Rubella Antibodies, IGG: 3.94 index (ref >0.99)

## 2018-02-03 LAB — MUMPS ANTIBODY, IGM: Mumps IgM: <0.8 AU (ref 0.00–0.79)

## 2018-02-05 NOTE — Progress Notes (Signed)
Patient advised.

## 2018-02-13 ENCOUNTER — Other Ambulatory Visit: Payer: Self-pay | Admitting: Family Medicine

## 2018-02-13 DIAGNOSIS — I10 Essential (primary) hypertension: Secondary | ICD-10-CM

## 2018-02-13 MED ORDER — AMLODIPINE BESY-BENAZEPRIL HCL 10-40 MG PO CAPS: 1.0000 | ORAL_CAPSULE | Freq: Every day | ORAL | 3 refills | Status: DC

## 2018-02-13 NOTE — Telephone Encounter (Signed)
Patient is requesting a refill on the following medication  amLODipine-benazepril (LOTREL) 10-40 MG capsule   [facility] uses [facility].  He has been out for three days.

## 2018-02-13 NOTE — Telephone Encounter (Signed)
Sent!

## 2018-02-15 ENCOUNTER — Ambulatory Visit: Payer: BLUE CROSS/BLUE SHIELD | Admitting: Urology

## 2018-02-15 ENCOUNTER — Encounter: Payer: Self-pay | Admitting: Urology

## 2018-03-06 ENCOUNTER — Ambulatory Visit: Payer: BLUE CROSS/BLUE SHIELD | Admitting: General Surgery

## 2018-03-06 ENCOUNTER — Encounter: Payer: Self-pay | Admitting: General Surgery

## 2018-03-06 VITALS — BP 136/87 | HR 78 | Resp 16 | Ht 67.0 in | Wt 285.0 lb

## 2018-03-06 DIAGNOSIS — K429 Umbilical hernia without obstruction or gangrene: Secondary | ICD-10-CM | POA: Insufficient documentation

## 2018-03-06 NOTE — Patient Instructions (Addendum)
Umbilical Hernia, Adult A hernia is a bulge of tissue that pushes through an opening between muscles. An umbilical hernia happens in the abdomen, near the belly button (umbilicus). The hernia may contain tissues from the small intestine, large intestine, or fatty tissue covering the intestines (omentum). Umbilical hernias in adults tend to get worse over time, and they require surgical treatment. There are several types of umbilical hernias. You may have:  A hernia located just above or below the umbilicus (indirect hernia). This is the most common type of umbilical hernia in adults.  A hernia that forms through an opening formed by the umbilicus (direct hernia).  A hernia that comes and goes (reducible hernia). A reducible hernia may be visible only when you strain, lift something heavy, or cough. This type of hernia can be pushed back into the abdomen (reduced).  A hernia that traps abdominal tissue inside the hernia (incarcerated hernia). This type of hernia cannot be reduced.  A hernia that cuts off blood flow to the tissues inside the hernia (strangulated hernia). The tissues can start to die if this happens. This type of hernia requires emergency treatment.  What are the causes? An umbilical hernia happens when tissue inside the abdomen presses on a weak area of the abdominal muscles. What increases the risk? You may have a greater risk of this condition if you:  Are obese.  Have had several pregnancies.  Have a buildup of fluid inside your abdomen (ascites).  Have had surgery that weakens the abdominal muscles.  What are the signs or symptoms? The main symptom of this condition is a painless bulge at or near the belly button. A reducible hernia may be visible only when you strain, lift something heavy, or cough. Other symptoms may include:  Dull pain.  A feeling of pressure.  Symptoms of a strangulated hernia may include:  Pain that gets increasingly worse.  Nausea and  vomiting.  Pain when pressing on the hernia.  Skin over the hernia becoming red or purple.  Constipation.  Blood in the stool.  How is this diagnosed? This condition may be diagnosed based on:  A physical exam. You may be asked to cough or strain while standing. These actions increase the pressure inside your abdomen and force the hernia through the opening in your muscles. Your health care provider may try to reduce the hernia by pressing on it.  Your symptoms and medical history.  How is this treated? Surgery is the only treatment for an umbilical hernia. Surgery for a strangulated hernia is done as soon as possible. If you have a small hernia that is not incarcerated, you may need to lose weight before having surgery. Follow these instructions at home:  Lose weight, if told by your health care provider.  Do not try to push the hernia back in.  Watch your hernia for any changes in color or size. Tell your health care provider if any changes occur.  You may need to avoid activities that increase pressure on your hernia.  Do not lift anything that is heavier than 10 lb (4.5 kg) until your health care provider says that this is safe.  Take over-the-counter and prescription medicines only as told by your health care provider.  Keep all follow-up visits as told by your health care provider. This is important. Contact a health care provider if:  Your hernia gets larger.  Your hernia becomes painful. Get help right away if:  You develop sudden, severe pain near the   area of your hernia.  You have pain as well as nausea or vomiting.  You have pain and the skin over your hernia changes color.  You develop a fever. This information is not intended to replace advice given to you by your health care provider. Make sure you discuss any questions you have with your health care provider. Document Released: 04/29/2016 Document Revised: 07/31/2016 Document Reviewed:  04/29/2016 Elsevier Interactive Patient Education  [employer].  The patient is scheduled for surgery at [facility] on 05/02/18. He will pre admit by phone. The patient is aware of date and instructions.

## 2018-03-06 NOTE — Progress Notes (Addendum)
Patient ID: ALBI RAPPAPORT, male   DOB: February 18, 1961, 57 y.o.   MRN: [id number]  Chief Complaint  Patient presents with  . Other    HPI Justin Soto is a 57 y.o. male here today for a evaluation of a umbilical hernia. Patient states he noticed this area about three weeks ago. He states he was walking around a building  during the day time for excisies and later that night he noticed the area was protruding out . In the last two week there is pain with activity. Moves his bowels daily.  HPI  Past Medical History:  Diagnosis Date  . Anemia   . Enlarged prostate    hx of  . Environmental and seasonal allergies   . Frequency of urination   . GERD (gastroesophageal reflux disease)   . History of kidney stones   . HOH (hard of hearing)    wears bilateral hearing aids  . Hypertension   . Irregular heart beat    Pt feels this every once in a while; PCP aware but stated it was OK  . Kidney stone on right side 01/15/2017  . Leg pain, right    Take Lyrica and Naproxen  . Neck pain of over 3 months duration   . Sleep apnea   . Thalassemia   . Tick fever     Past Surgical History:  Procedure Laterality Date  . ANTERIOR CERVICAL DECOMP/DISCECTOMY FUSION N/A 11/13/2014   Procedure: Cervical three-four, Cervical six-seven anterior cervical decompression with fusion interbody prosthesis plating and bonegraft;  Surgeon: Newman Pies, MD;  Location: [facility];  Service: Neurosurgery;  Laterality: N/A;  Cervical three-four, Cervical six-seven anterior cervical decompression with fusion interbody prosthesis plating and bonegraft  . BACK SURGERY     Lumbar X 2  . CALCANEAL OSTEOTOMY Right 01/11/2017   Procedure: CALCANEAL OSTEOTOMY;  Surgeon: Earnestine Leys, MD;  Location: [facility];  Service: Orthopedics;  Laterality: Right;  . CIRCUMCISION N/A 12/13/2017   Procedure: PENILE BLOCK, LYSIS OF PENILE ADHESIONS;  Surgeon: Hollice Espy, MD;  Location: [facility];  Service: Urology;  Laterality: N/A;  .  COLONOSCOPY    . CYSTOSCOPY N/A 12/13/2017   Procedure: CYSTOSCOPY;  Surgeon: Hollice Espy, MD;  Location: [facility];  Service: Urology;  Laterality: N/A;  . HERNIA REPAIR Left    groin  . LEG SURGERY Right    Had surgery on right calf X 5  . LITHOTRIPSY    . QUADRICEPS TENDON REPAIR Left 06/22/2015   Procedure: REPAIR QUADRICEP TENDON;  Surgeon: Earnestine Leys, MD;  Location: [facility];  Service: Orthopedics;  Laterality: Left;  Marland Kitchen QUADRICEPS TENDON REPAIR Left 08/12/2015   Procedure: REPAIR QUADRICEP TENDON;  Surgeon: Earnestine Leys, MD;  Location: [facility];  Service: Orthopedics;  Laterality: Left;  . ROTATOR CUFF REPAIR Left   . TONSILLECTOMY    . VASECTOMY     with hydrocele repaired    Family History  Problem Relation Age of Onset  . Prostate cancer Father   . Heart disease Father   . Diabetes Father   . Alzheimer's disease Father   . Anemia Mother   . Osteoporosis Mother   . Diabetes Brother   . Hypertension Brother   . Anemia Daughter   . Kidney disease Neg Hx   . Bladder Cancer Neg Hx     Social History Social History   Tobacco Use  . Smoking status: Never Smoker  . Smokeless tobacco: Never Used  Substance Use Topics  .  Alcohol use: No  . Drug use: No    Allergies  Allergen Reactions  . Methocarbamol Other (See Comments)    Numbness, and change of taste   . Vicodin [Hydrocodone-Acetaminophen] Itching    Has to use benedryl  . Tizanidine Nausea Only    Current Outpatient Medications  Medication Sig Dispense Refill  . amLODipine-benazepril (LOTREL) 10-40 MG capsule Take 1 capsule by mouth daily. 90 capsule 3  . aspirin EC 81 MG tablet Take 81 mg by mouth daily.    . Baloxavir Marboxil 80 MG Dose (XOFLUZA) 40 (2) MG TBPK Take 80 mg by mouth daily. 1 each 0  . cetirizine (ZYRTEC) 10 MG tablet 1 to 2 tablets daily as needed (Patient taking differently: Take 10 mg by mouth daily. ) 60 tablet 12  . finasteride (PROSCAR) 5 MG tablet TAKE ONE (1) TABLET BY MOUTH  EVERY DAY 90 tablet 3  . fluticasone (FLONASE) 50 MCG/ACT nasal spray Place 2 sprays into both nostrils daily. (Patient taking differently: Place 2 sprays into both nostrils daily as needed. ) 16 g 12  . hydrochlorothiazide (HYDRODIURIL) 25 MG tablet TAKE ONE TABLET BY MOUTH EVERY DAY 90 tablet 3  . meloxicam (MOBIC) 15 MG tablet Take 1 tablet (15 mg total) by mouth daily. 30 tablet 0  . metoprolol tartrate (LOPRESSOR) 25 MG tablet Take 1 tablet (25 mg total) by mouth 2 (two) times daily. (Patient taking differently: Take 25 mg by mouth daily. ) 180 tablet 3  . montelukast (SINGULAIR) 10 MG tablet TAKE ONE TABLET BY MOUTH EVERY MORNING 90 tablet 3  . Multiple Vitamin (MULTIVITAMIN WITH MINERALS) TABS tablet Take 1 tablet by mouth daily.    Marland Kitchen nystatin-triamcinolone ointment (MYCOLOG) Apply 1 application topically 2 (two) times daily. 30 g 0  . omeprazole (PRILOSEC) 10 MG capsule Take 10 mg by mouth daily.    Marland Kitchen sulfacetamide (BLEPH-10) 10 % ophthalmic solution Place 1 drop into the left eye every 3 (three) hours. 15 mL 0  . tamsulosin (FLOMAX) 0.4 MG CAPS capsule TAKE TWO CAPSULES BY MOUTH EACH DAY AS DIRECTED 60 capsule 6   No current facility-administered medications for this visit.     Review of Systems Review of Systems  Constitutional: Negative.   Respiratory: Negative.   Cardiovascular: Negative.     Blood pressure 136/87, pulse 78, resp. rate 16, height 5\' 7"  (1.702 m), weight 285 lb (129.3 kg).  Physical Exam Physical Exam  Constitutional: He is oriented to person, place, and time. He appears well-developed and well-nourished.  Cardiovascular: Normal rate, regular rhythm and normal heart sounds.  Pulmonary/Chest: Effort normal and breath sounds normal.  Abdominal: Soft. Normal appearance and bowel sounds are normal. A hernia (5 cm umbilical hernia defect. ) is present.    Neurological: He is alert and oriented to person, place, and time.  Skin: Skin is warm and dry.     Data  Reviewed CBC of August 11, 2014 showed a hemoglobin of 11.4 with an MCV of 61 White blood cell count of 4200, platelet count of 141,000.  February 01, 2018 CBC showed a hemoglobin of 10.4 with an MCV of 58, white blood cell count of 3900, platelet count of 195,000.  Basic metabolic panel of December 07, 2017 was normal except for minimal elevation of the serum glucose of 107.  Normal renal function.  Assessment    Symptomatic umbilical hernia.  Sleep apnea.  Morbid obesity.  Long-standing microcytic anemia.    Plan  Hernia precautions and incarceration were discussed with the patient. If they develop symptoms of an incarcerated hernia, they were encouraged to seek prompt medical attention.  Options for watchful waiting versus elective repair were reviewed.  At the small size no risk of incarceration.  I have recommended repair of the hernia on an outpatient basis when convenient.  The risk of infection was reviewed.  The patient is unlikely to require prosthetic mesh based on the palpable fascial defect size at this time.  HPI, Physical Exam, Assessment and Plan have been scribed under the direction and in the presence of Hervey Ard, MD.  Gaspar Cola, CMA  I have completed the exam and reviewed the above documentation for accuracy and completeness.  I agree with the above.  Haematologist has been used and any errors in dictation or transcription are unintentional.  Hervey Ard, M.D., F.A.C.S. The patient is scheduled for surgery at [facility] on 05/02/18. He will pre admit by phone. The patient is aware of date and instructions.  Documented by Caryl-Lyn Otis Brace LPN  Justin Soto Uliana Brinker 03/08/2018, 10:26 AM

## 2018-03-08 ENCOUNTER — Encounter: Payer: Self-pay | Admitting: General Surgery

## 2018-03-19 DIAGNOSIS — G4733 Obstructive sleep apnea (adult) (pediatric): Secondary | ICD-10-CM | POA: Diagnosis not present

## 2018-04-19 ENCOUNTER — Telehealth: Payer: Self-pay | Admitting: *Deleted

## 2018-04-19 NOTE — Telephone Encounter (Signed)
Patient called the office stating that his wife will be out of town the date he was scheduled for surgery which was 05-02-18.  He states he would like to get surgery rescheuled for sometime in August 2019.  The patient was notified that Dr. Dwyane Luo August schedule is not available at this time. He wishes to be contacted once available to get surgery rescheduled.   [facility] has been notified of cancellation.

## 2018-04-23 ENCOUNTER — Inpatient Hospital Stay: Admission: RE | Admit: 2018-04-23 | Payer: BLUE CROSS/BLUE SHIELD | Source: Ambulatory Visit

## 2018-04-27 ENCOUNTER — Other Ambulatory Visit: Payer: Self-pay | Admitting: Urology

## 2018-05-02 ENCOUNTER — Ambulatory Visit: Admit: 2018-05-02 | Payer: BLUE CROSS/BLUE SHIELD | Admitting: General Surgery

## 2018-05-02 SURGERY — REPAIR, HERNIA, UMBILICAL, ADULT
Anesthesia: Choice

## 2018-05-08 ENCOUNTER — Telehealth: Payer: Self-pay | Admitting: Urology

## 2018-05-08 NOTE — Telephone Encounter (Signed)
Patient is requesting a refill on his finasteride, but he is due for an office visit.  He will need a PSA prior to the appointment.  I have sent a refill in for the medication to cover him until his appointment.

## 2018-05-17 ENCOUNTER — Encounter: Payer: Self-pay | Admitting: *Deleted

## 2018-05-19 ENCOUNTER — Other Ambulatory Visit: Payer: Self-pay | Admitting: Urology

## 2018-05-19 DIAGNOSIS — R351 Nocturia: Secondary | ICD-10-CM

## 2018-05-29 ENCOUNTER — Other Ambulatory Visit: Payer: Self-pay | Admitting: Family Medicine

## 2018-05-29 MED ORDER — METOPROLOL TARTRATE 25 MG PO TABS: 25.0000 mg | ORAL_TABLET | Freq: Every day | ORAL | 3 refills | Status: DC

## 2018-05-29 NOTE — Telephone Encounter (Signed)
Patient needs refill on Metoprolol 25 mg. [facility] [place]

## 2018-06-04 ENCOUNTER — Other Ambulatory Visit: Payer: Self-pay | Admitting: Family Medicine

## 2018-06-04 DIAGNOSIS — N401 Enlarged prostate with lower urinary tract symptoms: Principal | ICD-10-CM

## 2018-06-04 DIAGNOSIS — N138 Other obstructive and reflux uropathy: Secondary | ICD-10-CM

## 2018-06-05 ENCOUNTER — Other Ambulatory Visit: Payer: BLUE CROSS/BLUE SHIELD

## 2018-06-05 DIAGNOSIS — N401 Enlarged prostate with lower urinary tract symptoms: Principal | ICD-10-CM

## 2018-06-05 DIAGNOSIS — N138 Other obstructive and reflux uropathy: Secondary | ICD-10-CM | POA: Diagnosis not present

## 2018-06-06 LAB — PSA: Prostate Specific Ag, Serum: 2 ng/mL (ref 0.0–4.0)

## 2018-06-06 NOTE — Progress Notes (Signed)
06/07/2018 4:37 PM   Justin Soto 04/23/1961 [id number]  Referring provider: Jerrol Banana., MD [address] [place], [place] [address]  Chief Complaint  Patient presents with  . Follow-up    HPI: Patient is a 57 year old Caucasian male with a history of buried penis and penile adhesions and BPH with LU TS who presents today for follow up.    Buried penis/penile adhesions 57 year old male with buried penis/penile adhesions was taken to the operating room on 12/13/17 for cystoscopy, lysis of penile adhesions.  Intraoperatively, the lesions were noted to be fairly significant involving greater than 75% of the coronal margin.  These were taken down bluntly and sharply intraoperatively.  There is no significant redundant foreskin for resection.  Initially following the surgery, he kept the area Vaseline was diligent about pulling back his skin to avoid re-adhesion.  Over the past approximately 2 weeks, he has had an area at the right lateral coronal margin which has become sore, red, with recurrent adhesive disease.  He is unable to break these adhesions.  He has had significant balanitis in the past and responded well to oral fluconazole as well as topical cream.  BPH WITH LUTS  (prostate and/or bladder) IPSS score: 16/2   Previous score: 17/5  Previous PVR: 0 mL  Major complaint(s): nocturia x 1-2  x for a good while.   He is on CPAP.  Denies any dysuria, hematuria or suprapubic pain.   Currently taking: tamsulosin 0.4 mg daily and finasteride 5 mg daily.  Denies any recent fevers, chills, nausea or vomiting.  His father had prostate cancer treated with RRP, died of Alzheimer's   IPSS    Row Name 06/07/18 1500         International Prostate Symptom Score   How often have you had the sensation of not emptying your bladder?  About half the time     How often have you had to urinate less than every two hours?  More than half the time     How often have you found  you stopped and started again several times when you urinated?  Less than 1 in 5 times     How often have you found it difficult to postpone urination?  Less than half the time     How often have you had a weak urinary stream?  Less than half the time     How often have you had to strain to start urination?  Less than half the time     How many times did you typically get up at night to urinate?  2 Times     Total IPSS Score  16       Quality of Life due to urinary symptoms   If you were to spend the rest of your life with your urinary condition just the way it is now how would you feel about that?  Mostly Satisfied        Score:  1-7 Mild 8-19 Moderate 20-35 Severe     PMH: Past Medical History:  Diagnosis Date  . Anemia   . Enlarged prostate    hx of  . Environmental and seasonal allergies   . Frequency of urination   . GERD (gastroesophageal reflux disease)   . History of kidney stones   . HOH (hard of hearing)    wears bilateral hearing aids  . Hypertension   . Irregular heart beat    Pt  feels this every once in a while; PCP aware but stated it was OK  . Kidney stone on right side 01/15/2017  . Leg pain, right    Take Lyrica and Naproxen  . Neck pain of over 3 months duration   . Sleep apnea   . Thalassemia   . Tick fever     Surgical History: Past Surgical History:  Procedure Laterality Date  . ANTERIOR CERVICAL DECOMP/DISCECTOMY FUSION N/A 11/13/2014   Procedure: Cervical three-four, Cervical six-seven anterior cervical decompression with fusion interbody prosthesis plating and bonegraft;  Surgeon: Newman Pies, MD;  Location: [facility];  Service: Neurosurgery;  Laterality: N/A;  Cervical three-four, Cervical six-seven anterior cervical decompression with fusion interbody prosthesis plating and bonegraft  . BACK SURGERY     Lumbar X 2  . CALCANEAL OSTEOTOMY Right 01/11/2017   Procedure: CALCANEAL OSTEOTOMY;  Surgeon: Earnestine Leys, MD;  Location: [facility];   Service: Orthopedics;  Laterality: Right;  . CIRCUMCISION N/A 12/13/2017   Procedure: PENILE BLOCK, LYSIS OF PENILE ADHESIONS;  Surgeon: Hollice Espy, MD;  Location: [facility];  Service: Urology;  Laterality: N/A;  . COLONOSCOPY    . CYSTOSCOPY N/A 12/13/2017   Procedure: CYSTOSCOPY;  Surgeon: Hollice Espy, MD;  Location: [facility];  Service: Urology;  Laterality: N/A;  . HERNIA REPAIR Left    groin  . LEG SURGERY Right    Had surgery on right calf X 5  . LITHOTRIPSY    . QUADRICEPS TENDON REPAIR Left 06/22/2015   Procedure: REPAIR QUADRICEP TENDON;  Surgeon: Earnestine Leys, MD;  Location: [facility];  Service: Orthopedics;  Laterality: Left;  Marland Kitchen QUADRICEPS TENDON REPAIR Left 08/12/2015   Procedure: REPAIR QUADRICEP TENDON;  Surgeon: Earnestine Leys, MD;  Location: [facility];  Service: Orthopedics;  Laterality: Left;  . ROTATOR CUFF REPAIR Left   . TONSILLECTOMY    . VASECTOMY     with hydrocele repaired    Home Medications:  Allergies as of 06/07/2018      Reactions   Methocarbamol Other (See Comments)   Numbness, and change of taste    Vicodin [hydrocodone-acetaminophen] Itching   Has to use benedryl   Tizanidine Nausea Only      Medication List        Accurate as of 06/07/18  4:37 PM. Always use your most recent med list.          amLODipine-benazepril 10-40 MG capsule Commonly known as:  LOTREL Take 1 capsule by mouth daily.   aspirin EC 81 MG tablet Take 81 mg by mouth daily.   Baloxavir Marboxil 80 MG Dose 40 (2) MG Tbpk Commonly known as:  XOFLUZA Take 80 mg by mouth daily.   cetirizine 10 MG tablet Commonly known as:  ZYRTEC 1 to 2 tablets daily as needed   finasteride 5 MG tablet Commonly known as:  PROSCAR TAKE ONE (1) TABLET BY MOUTH EVERY DAY   fluticasone 50 MCG/ACT nasal spray Commonly known as:  FLONASE Place 2 sprays into both nostrils daily.   hydrochlorothiazide 25 MG tablet Commonly known as:  HYDRODIURIL TAKE ONE TABLET BY MOUTH EVERY DAY     meloxicam 15 MG tablet Commonly known as:  MOBIC Take 1 tablet (15 mg total) by mouth daily.   metoprolol tartrate 25 MG tablet Commonly known as:  LOPRESSOR Take 1 tablet (25 mg total) by mouth daily.   montelukast 10 MG tablet Commonly known as:  SINGULAIR TAKE ONE TABLET BY MOUTH EVERY MORNING   multivitamin with  minerals Tabs tablet Take 1 tablet by mouth daily.   nystatin-triamcinolone ointment Commonly known as:  MYCOLOG Apply 1 application topically 2 (two) times daily.   omeprazole 10 MG capsule Commonly known as:  PRILOSEC Take 10 mg by mouth daily.   sulfacetamide 10 % ophthalmic solution Commonly known as:  BLEPH-10 Place 1 drop into the left eye every 3 (three) hours.   tamsulosin 0.4 MG Caps capsule Commonly known as:  FLOMAX TAKE TWO CAPSULES EACH DAY AS DIRECTED       Allergies:  Allergies  Allergen Reactions  . Methocarbamol Other (See Comments)    Numbness, and change of taste   . Vicodin [Hydrocodone-Acetaminophen] Itching    Has to use benedryl  . Tizanidine Nausea Only    Family History: Family History  Problem Relation Age of Onset  . Prostate cancer Father   . Heart disease Father   . Diabetes Father   . Alzheimer's disease Father   . Anemia Mother   . Osteoporosis Mother   . Diabetes Brother   . Hypertension Brother   . Anemia Daughter   . Kidney disease Neg Hx   . Bladder Cancer Neg Hx     Social History:  reports that he has never smoked. He has never used smokeless tobacco. He reports that he does not drink alcohol or use drugs.  ROS: UROLOGY Frequent Urination?: No Hard to postpone urination?: No Burning/pain with urination?: No Get up at night to urinate?: No Leakage of urine?: No Urine stream starts and stops?: No Trouble starting stream?: No Do you have to strain to urinate?: No Blood in urine?: No Urinary tract infection?: No Sexually transmitted disease?: No Injury to kidneys or bladder?: No Painful  intercourse?: No Weak stream?: No Erection problems?: No Penile pain?: No  Gastrointestinal Nausea?: No Vomiting?: No Indigestion/heartburn?: No Diarrhea?: No Constipation?: No  Constitutional Fever: No Night sweats?: No Weight loss?: No Fatigue?: No  Skin Skin rash/lesions?: No Itching?: No  Eyes Blurred vision?: No Double vision?: No  Ears/Nose/Throat Sore throat?: No Sinus problems?: No  Hematologic/Lymphatic Swollen glands?: No Easy bruising?: No  Cardiovascular Leg swelling?: No Chest pain?: No  Respiratory Cough?: No Shortness of breath?: No  Endocrine Excessive thirst?: No  Musculoskeletal Back pain?: No Joint pain?: No  Neurological Headaches?: No Dizziness?: No  Psychologic Depression?: No Anxiety?: No  Physical Exam: BP (!) 145/81   Pulse 81   Ht 5\' 6"  (1.676 m)   Wt 278 lb (126.1 kg)   SpO2 95%   BMI 44.87 kg/m   Constitutional: Well nourished. Alert and oriented, No acute distress. HEENT: [place] AT, moist mucus membranes. Trachea midline, no masses. Cardiovascular: No clubbing, cyanosis, or edema. Respiratory: Normal respiratory effort, no increased work of breathing. GI: Abdomen is soft, non tender, non distended, no abdominal masses. Liver and spleen not palpable.  No hernias appreciated.  Stool sample for occult testing is not indicated.   GU: No CVA tenderness.  No bladder fullness or masses.  Patient with circumcised phallus.  Urethral meatus is patent.  Penile adhesions noted around the right coronal margin and left coronal margin.  No penile discharge. No penile lesions or rashes. Scrotum without lesions, cysts, rashes and/or edema.  Testicles are located scrotally bilaterally. No masses are appreciated in the testicles. Left and right epididymis are normal. Rectal: Patient with  normal sphincter tone. Anus and perineum without scarring or rashes. No rectal masses are appreciated. Prostate is approximately 45 grams, very firm, ?  Small nodule in the right lobe are appreciated. Seminal vesicles are normal. Skin: No rashes, bruises or suspicious lesions. Lymph: No cervical or inguinal adenopathy. Neurologic: Grossly intact, no focal deficits, moving all 4 extremities. Psychiatric: Normal mood and affect.  Laboratory Data: PSA Trend  3.2 in 10/2012  3.2 in 08/2015  3.31 in 10/2016 - started on finasteride  2.1 (4.2)in 11/2017  2.0 (4.0) 0in 05/2018 Lab Results  Component Value Date   WBC 3.9 02/01/2018   HGB 10.4 (L) 02/01/2018   HCT 30.5 (L) 02/01/2018   MCV 58 (L) 02/01/2018   PLT 195 02/01/2018    Lab Results  Component Value Date   CREATININE 0.93 12/07/2017     Lab Results  Component Value Date   HGBA1C 5.6 08/09/2017    Urinalysis Lab Results  Component Value Date   SPECGRAV 1.025 11/23/2017   PHUR 5.5 11/23/2017   COLORU Yellow 11/23/2017   APPEARANCEUR Clear 11/23/2017   LEUKOCYTESUR Negative 11/23/2017   PROTEINUR Negative 11/23/2017   GLUCOSEU Negative 11/23/2017   KETONESU Trace (A) 11/23/2017   RBCU Negative 11/23/2017   BILIRUBINUR Negative 11/23/2017   UUROB 0.2 11/23/2017   NITRITE Negative 11/23/2017    Lab Results  Component Value Date   LABMICR See below: 11/23/2017   WBCUA 0-5 11/23/2017   RBCUA None seen 11/23/2017   LABEPIT None seen 11/23/2017   MUCUS Present (A) 11/23/2017   BACTERIA Few (A) 11/23/2017    Pertinent Imaging: N/A  Assessment & Plan:    1. PSA screening  Patient's PSA velocity is less than 0.75 ng/mL yearly, but it is higher than the age specific guidelines of 64 to 58 years of age and over the value of 3 ng/mL PSA value also has also not decreased to 50% of the baseline value of 3.31 which would be 1.65 Discussed with patient that according to the prostate prevention trial, he has a 35% probability of having prostate cancer - recommend a biopsy at this time Patient will be schedule for a TRUSPBx of prostate.  The procedure is explained and  the risks involved, such as blood in urine, blood in stool, blood in semen, infection, urinary retention, and on rare occasions sepsis and death.  Patient understands the risks as explained to him and he wishes to proceed.  Patient is on ASA and Mobic and is advised to discontinue the medication ten days prior to the biopsy.  He will also need cardiac clearance prior to stopping medication.  2. Abnormal prostate  Very firm - ? Nodule in right lobe See above   3. BPH with LUTS IPSS score is 16/2 Continue conservative management, avoiding bladder irritants and timed voiding's Continue tamsulosin 0.4 mg daily and finasteride 5 mg daily; refills given RTC for biopsy of prostate   4. Penile adhesions Significant penile adhesions status post lysis of adhesions in the operating room  Recurrence along right coronal margin and left margin Recommended warm bath to loosen skin with mechanical lysis as tolerated No role for circumcision given that he has already been circumcised and he has no redundant foreskin Significant buried penis as contributing factor-encouraged weight loss  5. Candidal balanitis Resolved  Return for TRUSPBx of prostate; patient on ASA and mobic .  Zara Council, PA-C  [facility] [address], [place], [place] [address] [phone]

## 2018-06-07 ENCOUNTER — Encounter: Payer: Self-pay | Admitting: Urology

## 2018-06-07 ENCOUNTER — Ambulatory Visit (INDEPENDENT_AMBULATORY_CARE_PROVIDER_SITE_OTHER): Payer: BLUE CROSS/BLUE SHIELD | Admitting: Urology

## 2018-06-07 VITALS — BP 145/81 | HR 81 | Ht 66.0 in | Wt 278.0 lb

## 2018-06-07 DIAGNOSIS — N475 Adhesions of prepuce and glans penis: Secondary | ICD-10-CM

## 2018-06-07 DIAGNOSIS — R3989 Other symptoms and signs involving the genitourinary system: Secondary | ICD-10-CM | POA: Diagnosis not present

## 2018-06-07 DIAGNOSIS — Z125 Encounter for screening for malignant neoplasm of prostate: Secondary | ICD-10-CM | POA: Diagnosis not present

## 2018-06-07 DIAGNOSIS — N401 Enlarged prostate with lower urinary tract symptoms: Secondary | ICD-10-CM | POA: Diagnosis not present

## 2018-06-07 DIAGNOSIS — N138 Other obstructive and reflux uropathy: Secondary | ICD-10-CM | POA: Diagnosis not present

## 2018-06-28 DIAGNOSIS — G4733 Obstructive sleep apnea (adult) (pediatric): Secondary | ICD-10-CM | POA: Diagnosis not present

## 2018-07-25 ENCOUNTER — Encounter: Payer: Self-pay | Admitting: Urology

## 2018-07-25 ENCOUNTER — Ambulatory Visit: Payer: BLUE CROSS/BLUE SHIELD | Admitting: Urology

## 2018-07-25 ENCOUNTER — Other Ambulatory Visit: Payer: Self-pay

## 2018-07-25 ENCOUNTER — Other Ambulatory Visit: Payer: Self-pay | Admitting: Urology

## 2018-07-25 VITALS — BP 148/78 | HR 90 | Ht 67.0 in | Wt 276.8 lb

## 2018-07-25 DIAGNOSIS — R3989 Other symptoms and signs involving the genitourinary system: Secondary | ICD-10-CM | POA: Diagnosis not present

## 2018-07-25 DIAGNOSIS — N138 Other obstructive and reflux uropathy: Secondary | ICD-10-CM

## 2018-07-25 DIAGNOSIS — C61 Malignant neoplasm of prostate: Secondary | ICD-10-CM | POA: Diagnosis not present

## 2018-07-25 DIAGNOSIS — N401 Enlarged prostate with lower urinary tract symptoms: Secondary | ICD-10-CM | POA: Diagnosis not present

## 2018-07-25 DIAGNOSIS — N4232 Atypical small acinar proliferation of prostate: Secondary | ICD-10-CM | POA: Diagnosis not present

## 2018-07-25 MED ORDER — LEVOFLOXACIN 500 MG PO TABS
500.0000 mg | ORAL_TABLET | Freq: Once | ORAL | Status: AC
  Administered 2018-07-25: 500 mg via ORAL

## 2018-07-25 MED ORDER — GENTAMICIN SULFATE 40 MG/ML IJ SOLN
80.0000 mg | Freq: Once | INTRAMUSCULAR | Status: AC
  Administered 2018-07-25: 80 mg via INTRAMUSCULAR

## 2018-07-25 NOTE — Progress Notes (Signed)
Prostate Biopsy Procedure   Informed consent was obtained after discussing risks/benefits of the procedure.  A time out was performed to ensure correct patient identity.  Pre-Procedure: - Last PSA Level:  Lab Results  Component Value Date   PSA 3.31 10/28/2016   PSA 3.2 05/16/2014   - Gentamicin given prophylactically - Levaquin 500 mg administered PO -Transrectal Ultrasound performed revealing a 48 gm prostate -No significant hypoechoic or median lobe noted  Procedure: - Prostate block performed using 10 cc 1% lidocaine and biopsies taken from sextant areas, a total of 12 under ultrasound guidance.  Post-Procedure: - Patient tolerated the procedure well - He was counseled to seek immediate medical attention if experiences any severe pain, significant bleeding, or fevers - Return in one week to discuss biopsy results   John Giovanni, MD

## 2018-07-30 ENCOUNTER — Other Ambulatory Visit: Payer: Self-pay | Admitting: Urology

## 2018-08-02 LAB — PATHOLOGY REPORT

## 2018-08-08 ENCOUNTER — Encounter: Payer: Self-pay | Admitting: Urology

## 2018-08-08 ENCOUNTER — Other Ambulatory Visit: Payer: Self-pay

## 2018-08-08 ENCOUNTER — Other Ambulatory Visit: Payer: Self-pay | Admitting: Urology

## 2018-08-08 ENCOUNTER — Ambulatory Visit (INDEPENDENT_AMBULATORY_CARE_PROVIDER_SITE_OTHER): Payer: BLUE CROSS/BLUE SHIELD | Admitting: Urology

## 2018-08-08 VITALS — BP 135/73 | HR 96 | Ht 66.0 in | Wt 278.0 lb

## 2018-08-08 DIAGNOSIS — C61 Malignant neoplasm of prostate: Secondary | ICD-10-CM | POA: Diagnosis not present

## 2018-08-09 ENCOUNTER — Encounter: Payer: Self-pay | Admitting: Urology

## 2018-08-09 DIAGNOSIS — C61 Malignant neoplasm of prostate: Secondary | ICD-10-CM | POA: Insufficient documentation

## 2018-08-09 NOTE — Progress Notes (Signed)
08/08/2018 11:40 AM   Justin Soto 1961-06-01 [id number]  Referring provider: Jerrol Banana., MD [address] [place], [place] [address]  Chief Complaint  Patient presents with  . Prostate Biopsy Results    HPI: 57 year old male presents for prostate biopsy follow-up.  For a slowly rising PSA on finasteride.  His last PSA June 2019 was 2.0 (uncorrected).  He had no post biopsy complaints.  Prostate volume was 48 g.  Standard 12 core biopsies were performed.  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  The left lateral apex showed atypical cells suspicious for cancer.  Percent of tissue involved range from 1-25%.   PMH: Past Medical History:  Diagnosis Date  . Anemia   . Enlarged prostate    hx of  . Environmental and seasonal allergies   . Frequency of urination   . GERD (gastroesophageal reflux disease)   . History of kidney stones   . HOH (hard of hearing)    wears bilateral hearing aids  . Hypertension   . Irregular heart beat    Pt feels this every once in a while; PCP aware but stated it was OK  . Kidney stone on right side 01/15/2017  . Leg pain, right    Take Lyrica and Naproxen  . Neck pain of over 3 months duration   . Sleep apnea   . Thalassemia   . Tick fever     Surgical History: Past Surgical History:  Procedure Laterality Date  . ANTERIOR CERVICAL DECOMP/DISCECTOMY FUSION N/A 11/13/2014   Procedure: Cervical three-four, Cervical six-seven anterior cervical decompression with fusion interbody prosthesis plating and bonegraft;  Surgeon: Newman Pies, MD;  Location: [facility];  Service: Neurosurgery;  Laterality: N/A;  Cervical three-four, Cervical six-seven anterior cervical decompression with fusion interbody prosthesis plating and bonegraft  . BACK SURGERY     Lumbar X 2  . CALCANEAL OSTEOTOMY Right 01/11/2017   Procedure:  CALCANEAL OSTEOTOMY;  Surgeon: Earnestine Leys, MD;  Location: [facility];  Service: Orthopedics;  Laterality: Right;  . CIRCUMCISION N/A 12/13/2017   Procedure: PENILE BLOCK, LYSIS OF PENILE ADHESIONS;  Surgeon: Hollice Espy, MD;  Location: [facility];  Service: Urology;  Laterality: N/A;  . COLONOSCOPY    . CYSTOSCOPY N/A 12/13/2017   Procedure: CYSTOSCOPY;  Surgeon: Hollice Espy, MD;  Location: [facility];  Service: Urology;  Laterality: N/A;  . HERNIA REPAIR Left    groin  . LEG SURGERY Right    Had surgery on right calf X 5  . LITHOTRIPSY    . QUADRICEPS TENDON REPAIR Left 06/22/2015   Procedure: REPAIR QUADRICEP TENDON;  Surgeon: Earnestine Leys, MD;  Location: [facility];  Service: Orthopedics;  Laterality: Left;  Marland Kitchen QUADRICEPS TENDON REPAIR Left 08/12/2015   Procedure: REPAIR QUADRICEP TENDON;  Surgeon: Earnestine Leys, MD;  Location: [facility];  Service: Orthopedics;  Laterality: Left;  . ROTATOR CUFF REPAIR Left   . TONSILLECTOMY    . VASECTOMY     with hydrocele repaired    Home Medications:  Allergies as of 08/08/2018      Reactions   Methocarbamol Other (See Comments)   Numbness, and change of taste    Vicodin [hydrocodone-acetaminophen] Itching   Has to use benedryl   Tizanidine Nausea Only      Medication List        Accurate as of 08/08/18 11:59 PM. Always  use your most recent med list.          amLODipine-benazepril 10-40 MG capsule Commonly known as:  LOTREL Take 1 capsule by mouth daily.   aspirin EC 81 MG tablet Take 81 mg by mouth daily.   Baloxavir Marboxil(80 MG Dose) 2 x 40 MG Tbpk Take 80 mg by mouth daily.   cetirizine 10 MG tablet Commonly known as:  ZYRTEC 1 to 2 tablets daily as needed   finasteride 5 MG tablet Commonly known as:  PROSCAR TAKE ONE (1) TABLET BY MOUTH EVERY DAY   fluticasone 50 MCG/ACT nasal spray Commonly known as:  FLONASE Place 2 sprays into both nostrils daily.   hydrochlorothiazide 25 MG tablet Commonly known as:   HYDRODIURIL TAKE ONE TABLET BY MOUTH EVERY DAY   meloxicam 15 MG tablet Commonly known as:  MOBIC Take 1 tablet (15 mg total) by mouth daily.   metoprolol tartrate 25 MG tablet Commonly known as:  LOPRESSOR Take 1 tablet (25 mg total) by mouth daily.   montelukast 10 MG tablet Commonly known as:  SINGULAIR TAKE ONE TABLET BY MOUTH EVERY MORNING   multivitamin with minerals Tabs tablet Take 1 tablet by mouth daily.   nystatin-triamcinolone ointment Commonly known as:  MYCOLOG Apply 1 application topically 2 (two) times daily.   omeprazole 10 MG capsule Commonly known as:  PRILOSEC Take 10 mg by mouth daily.   sulfacetamide 10 % ophthalmic solution Commonly known as:  BLEPH-10 Place 1 drop into the left eye every 3 (three) hours.   tamsulosin 0.4 MG Caps capsule Commonly known as:  FLOMAX TAKE TWO CAPSULES EACH DAY AS DIRECTED       Allergies:  Allergies  Allergen Reactions  . Methocarbamol Other (See Comments)    Numbness, and change of taste   . Vicodin [Hydrocodone-Acetaminophen] Itching    Has to use benedryl  . Tizanidine Nausea Only    Family History: Family History  Problem Relation Age of Onset  . Prostate cancer Father   . Heart disease Father   . Diabetes Father   . Alzheimer's disease Father   . Anemia Mother   . Osteoporosis Mother   . Diabetes Brother   . Hypertension Brother   . Anemia Daughter   . Kidney disease Neg Hx   . Bladder Cancer Neg Hx     Social History:  reports that he has never smoked. He has never used smokeless tobacco. He reports that he does not drink alcohol or use drugs.  ROS: UROLOGY Frequent Urination?: Yes Hard to postpone urination?: No Burning/pain with urination?: No Get up at night to urinate?: No Leakage of urine?: No Urine stream starts and stops?: No Trouble starting stream?: No Do you have to strain to urinate?: No Blood in urine?: No Urinary tract infection?: No Sexually transmitted disease?:  No Injury to kidneys or bladder?: No Painful intercourse?: No Weak stream?: No Erection problems?: No Penile pain?: No  Gastrointestinal Nausea?: No Vomiting?: No Indigestion/heartburn?: No Diarrhea?: No Constipation?: No  Constitutional Fever: No Night sweats?: No Weight loss?: No Fatigue?: No  Skin Skin rash/lesions?: No Itching?: No  Eyes Blurred vision?: No Double vision?: No  Ears/Nose/Throat Sore throat?: No Sinus problems?: No  Hematologic/Lymphatic Swollen glands?: No Easy bruising?: No  Cardiovascular Leg swelling?: No Chest pain?: No  Respiratory Cough?: No Shortness of breath?: No  Endocrine Excessive thirst?: No  Musculoskeletal Back pain?: No Joint pain?: No  Neurological Headaches?: No Dizziness?: No  Psychologic Depression?: No  Anxiety?: No  Physical Exam: BP 135/73   Pulse 96   Ht 5\' 6"  (1.676 m)   Wt 278 lb (126.1 kg)   BMI 44.87 kg/m   Constitutional:  Alert and oriented, No acute distress.   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  The pathology report was discussed in detail.  Management options were discussed including active surveillance, radical prostatectomy, radiation therapy including brachytherapy and IMRT, and HIFU.  He would like to think over these options.  He will call back for a radiation oncology referral if interested in more information.  He indicated he would call back with his decision.  Greater than 50% of this 15-minute visit was spent counseling the patient.   Abbie Sons, [place] [address], [place], [place] [address] [phone]

## 2018-08-10 ENCOUNTER — Ambulatory Visit: Payer: BLUE CROSS/BLUE SHIELD | Admitting: Podiatry

## 2018-08-10 ENCOUNTER — Encounter: Payer: Self-pay | Admitting: Podiatry

## 2018-08-10 DIAGNOSIS — L600 Ingrowing nail: Secondary | ICD-10-CM | POA: Diagnosis not present

## 2018-08-10 NOTE — Patient Instructions (Signed)

## 2018-08-12 NOTE — Progress Notes (Signed)
   Subjective: Patient presents today for evaluation of intermittent pain to the medial border of the right hallux that began about one year ago. Patient is concerned for possible ingrown nail. He reports associated redness and swelling. Touching the toe and wearing certain shoes increases the pain. He has tried clipping the nail out himself unsuccessfully. Patient presents today for further treatment and evaluation.  Past Medical History:  Diagnosis Date  . Anemia   . Enlarged prostate    hx of  . Environmental and seasonal allergies   . Frequency of urination   . GERD (gastroesophageal reflux disease)   . History of kidney stones   . HOH (hard of hearing)    wears bilateral hearing aids  . Hypertension   . Irregular heart beat    Pt feels this every once in a while; PCP aware but stated it was OK  . Kidney stone on right side 01/15/2017  . Leg pain, right    Take Lyrica and Naproxen  . Neck pain of over 3 months duration   . Sleep apnea   . Thalassemia   . Tick fever     Objective:  General: Well developed, nourished, in no acute distress, alert and oriented x3   Dermatology: Skin is warm, dry and supple bilateral. Medial border of the right hallux appears to be erythematous with evidence of an ingrowing nail. Pain on palpation noted to the border of the nail fold. The remaining nails appear unremarkable at this time. There are no open sores, lesions.  Vascular: Dorsalis Pedis artery and Posterior Tibial artery pedal pulses palpable. No lower extremity edema noted.   Neruologic: Grossly intact via light touch bilateral.  Musculoskeletal: Muscular strength within normal limits in all groups bilateral. Normal range of motion noted to all pedal and ankle joints.   Assesement: #1 Paronychia with ingrowing nail medial border right hallux #2 Pain in toe #3 Incurvated nail  Plan of Care:  1. Patient evaluated.  2. Discussed treatment alternatives and plan of care. Explained nail  avulsion procedure and post procedure course to patient. 3. Patient opted for permanent partial nail avulsion.  4. Prior to procedure, local anesthesia infiltration utilized using 3 ml of a 50:50 mixture of 2% plain lidocaine and 0.5% plain marcaine in a normal hallux block fashion and a betadine prep performed.  5. Partial permanent nail avulsion with chemical matrixectomy performed using [id number] applications of phenol followed by alcohol flush.  6. Light dressing applied. 7. Return to clinic in 2 weeks.   Arts administrator for [employer].  Edrick Kins, DPM [facility]  Dr. Edrick Kins, [place]                                        [place], [place] [address]                Office [phone]  Fax [phone]

## 2018-08-24 ENCOUNTER — Ambulatory Visit: Payer: BLUE CROSS/BLUE SHIELD | Admitting: Family Medicine

## 2018-08-24 ENCOUNTER — Encounter: Payer: Self-pay | Admitting: Family Medicine

## 2018-08-24 VITALS — BP 116/70 | HR 67 | Temp 98.8°F | Wt 285.0 lb

## 2018-08-24 DIAGNOSIS — W57XXXA Bitten or stung by nonvenomous insect and other nonvenomous arthropods, initial encounter: Secondary | ICD-10-CM

## 2018-08-24 DIAGNOSIS — M2550 Pain in unspecified joint: Secondary | ICD-10-CM

## 2018-08-24 DIAGNOSIS — A770 Spotted fever due to Rickettsia rickettsii: Secondary | ICD-10-CM | POA: Diagnosis not present

## 2018-08-24 DIAGNOSIS — S20369A Insect bite (nonvenomous) of unspecified front wall of thorax, initial encounter: Secondary | ICD-10-CM | POA: Diagnosis not present

## 2018-08-24 MED ORDER — DOXYCYCLINE HYCLATE 100 MG PO TABS: 100.0000 mg | ORAL_TABLET | Freq: Two times a day (BID) | ORAL | 0 refills | Status: DC

## 2018-08-24 NOTE — Patient Instructions (Signed)

## 2018-08-24 NOTE — Progress Notes (Signed)
Patient: Justin Soto Male    DOB: [birth year]   57 y.o.   MRN: [id number] Visit Date: 08/24/2018  Today's Provider: Vernie Murders, PA   Chief Complaint  Patient presents with  . Insect Bite    Recent Tick bite.   . Joint Pain    Pt reports pain has been going on for about a month.  Worsening especially in the last two weeks.    Subjective:    HPI   Pt comes in complaining of Joint pain, general malaise.  He states the joint pain started about a month ago but seems to be worsening in the last two weeks. He has pulled several ticks off of him this summer.       Past Medical History:  Diagnosis Date  . Anemia   . Enlarged prostate    hx of  . Environmental and seasonal allergies   . Frequency of urination   . GERD (gastroesophageal reflux disease)   . History of kidney stones   . HOH (hard of hearing)    wears bilateral hearing aids  . Hypertension   . Irregular heart beat    Pt feels this every once in a while; PCP aware but stated it was OK  . Kidney stone on right side 01/15/2017  . Leg pain, right    Take Lyrica and Naproxen  . Neck pain of over 3 months duration   . Sleep apnea   . Thalassemia   . Tick fever    Past Surgical History:  Procedure Laterality Date  . ANTERIOR CERVICAL DECOMP/DISCECTOMY FUSION N/A 11/13/2014   Procedure: Cervical three-four, Cervical six-seven anterior cervical decompression with fusion interbody prosthesis plating and bonegraft;  Surgeon: Newman Pies, MD;  Location: [facility];  Service: Neurosurgery;  Laterality: N/A;  Cervical three-four, Cervical six-seven anterior cervical decompression with fusion interbody prosthesis plating and bonegraft  . BACK SURGERY     Lumbar X 2  . CALCANEAL OSTEOTOMY Right 01/11/2017   Procedure: CALCANEAL OSTEOTOMY;  Surgeon: Earnestine Leys, MD;  Location: [facility];  Service: Orthopedics;  Laterality: Right;  . CIRCUMCISION N/A 12/13/2017   Procedure: PENILE BLOCK, LYSIS OF PENILE ADHESIONS;   Surgeon: Hollice Espy, MD;  Location: [facility];  Service: Urology;  Laterality: N/A;  . COLONOSCOPY    . CYSTOSCOPY N/A 12/13/2017   Procedure: CYSTOSCOPY;  Surgeon: Hollice Espy, MD;  Location: [facility];  Service: Urology;  Laterality: N/A;  . HERNIA REPAIR Left    groin  . LEG SURGERY Right    Had surgery on right calf X 5  . LITHOTRIPSY    . QUADRICEPS TENDON REPAIR Left 06/22/2015   Procedure: REPAIR QUADRICEP TENDON;  Surgeon: Earnestine Leys, MD;  Location: [facility];  Service: Orthopedics;  Laterality: Left;  Marland Kitchen QUADRICEPS TENDON REPAIR Left 08/12/2015   Procedure: REPAIR QUADRICEP TENDON;  Surgeon: Earnestine Leys, MD;  Location: [facility];  Service: Orthopedics;  Laterality: Left;  . ROTATOR CUFF REPAIR Left   . TONSILLECTOMY    . VASECTOMY     with hydrocele repaired   Family History  Problem Relation Age of Onset  . Prostate cancer Father   . Heart disease Father   . Diabetes Father   . Alzheimer's disease Father   . Anemia Mother   . Osteoporosis Mother   . Diabetes Brother   . Hypertension Brother   . Anemia Daughter   . Kidney disease Neg Hx   . Bladder Cancer Neg  Hx    Allergies  Allergen Reactions  . Methocarbamol Other (See Comments)    Numbness, and change of taste   . Vicodin [Hydrocodone-Acetaminophen] Itching    Has to use benedryl  . Tizanidine Nausea Only    Current Outpatient Medications:  .  amLODipine-benazepril (LOTREL) 10-40 MG capsule, Take 1 capsule by mouth daily., Disp: 90 capsule, Rfl: 3 .  aspirin EC 81 MG tablet, Take 81 mg by mouth daily., Disp: , Rfl:  .  Baloxavir Marboxil 80 MG Dose (XOFLUZA) 40 (2) MG TBPK, Take 80 mg by mouth daily., Disp: 1 each, Rfl: 0 .  cetirizine (ZYRTEC) 10 MG tablet, 1 to 2 tablets daily as needed (Patient taking differently: Take 10 mg by mouth daily. ), Disp: 60 tablet, Rfl: 12 .  finasteride (PROSCAR) 5 MG tablet, TAKE ONE (1) TABLET BY MOUTH EVERY DAY, Disp: 90 tablet, Rfl: 0 .  fluticasone (FLONASE) 50  MCG/ACT nasal spray, Place 2 sprays into both nostrils daily. (Patient taking differently: Place 2 sprays into both nostrils daily as needed. ), Disp: 16 g, Rfl: 12 .  hydrochlorothiazide (HYDRODIURIL) 25 MG tablet, TAKE ONE TABLET BY MOUTH EVERY DAY, Disp: 90 tablet, Rfl: 3 .  meloxicam (MOBIC) 15 MG tablet, Take 1 tablet (15 mg total) by mouth daily., Disp: 30 tablet, Rfl: 0 .  metoprolol tartrate (LOPRESSOR) 25 MG tablet, Take 1 tablet (25 mg total) by mouth daily., Disp: 90 tablet, Rfl: 3 .  montelukast (SINGULAIR) 10 MG tablet, TAKE ONE TABLET BY MOUTH EVERY MORNING, Disp: 90 tablet, Rfl: 3 .  Multiple Vitamin (MULTIVITAMIN WITH MINERALS) TABS tablet, Take 1 tablet by mouth daily., Disp: , Rfl:  .  omeprazole (PRILOSEC) 10 MG capsule, Take 10 mg by mouth daily., Disp: , Rfl:  .  tamsulosin (FLOMAX) 0.4 MG CAPS capsule, TAKE TWO CAPSULES EACH DAY AS DIRECTED, Disp: 60 capsule, Rfl: 6 .  nystatin-triamcinolone ointment (MYCOLOG), Apply 1 application topically 2 (two) times daily., Disp: 30 g, Rfl: 0 .  sulfacetamide (BLEPH-10) 10 % ophthalmic solution, Place 1 drop into the left eye every 3 (three) hours., Disp: 15 mL, Rfl: 0  Review of Systems  Constitutional: Positive for chills and fatigue. Negative for activity change, appetite change, diaphoresis, fever and unexpected weight change.  HENT: Positive for sore throat.   Respiratory: Negative.   Cardiovascular: Negative.   Gastrointestinal: Negative.   Musculoskeletal: Positive for arthralgias and back pain. Negative for joint swelling, myalgias, neck pain and neck stiffness.  Skin: Negative for rash.  Neurological: Positive for headaches. Negative for dizziness, weakness and light-headedness.  Hematological: Negative for adenopathy. Does not bruise/bleed easily.   Social History   Tobacco Use  . Smoking status: Never Smoker  . Smokeless tobacco: Never Used  Substance Use Topics  . Alcohol use: No   Objective:   BP 116/70 (BP  Location: Right Arm, Patient Position: Sitting, Cuff Size: Large)   Pulse 67   Temp 98.8 F (37.1 C) (Oral)   Wt 285 lb (129.3 kg)   SpO2 96%   BMI 46.00 kg/m  Vitals:   08/24/18 1011  BP: 116/70  Pulse: 67  Temp: 98.8 F (37.1 C)  TempSrc: Oral  SpO2: 96%  Weight: 285 lb (129.3 kg)   Physical Exam  Constitutional: He is oriented to person, place, and time. He appears well-developed and well-nourished. No distress.  HENT:  Head: Normocephalic and atraumatic.  Right Ear: Hearing normal.  Left Ear: Hearing normal.  Nose: Nose normal.  Eyes: Conjunctivae and lids are normal. Right eye exhibits no discharge. Left eye exhibits no discharge. No scleral icterus.  Neck: Normal range of motion. Neck supple.  Cardiovascular: Normal rate, regular rhythm and normal heart sounds.  Pulmonary/Chest: Effort normal and breath sounds normal. No respiratory distress.  Abdominal: Soft. Bowel sounds are normal.  Musculoskeletal:  Stiffness and ache in nearly all joints. Unable to close fist. No redness in joints. Slight puffiness in knees with history of left quadricep tear and surgery. 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  Neurological: He is alert and oriented to person, place, and time.  Skin: Skin is intact. No lesion noted.  2.5 cm slight raised pink rash on the left posterior shoulder with central flaky skin. No local lymphadenopathy.  Psychiatric: He has a normal mood and affect. His speech is normal and behavior is normal. Thought content normal.      Assessment & Plan:     1. Tick bite, initial encounter States he has had 10 tick bites the past few week and the last one was 2 weeks ago. No fever or rash other than reaction around the last bite. All ticks were attached when found. Will check labs for Lyme or RMSF disease. Start Doxycycline and may use Tylenol for joint pains. Follow up pending lab  reports. - doxycycline (VIBRA-TABS) 100 MG tablet; Take 1 tablet (100 mg total) by mouth 2 (two) times daily.  Dispense: 20 tablet; Refill: 0 - CBC with Differential/Platelet - Sedimentation rate - Lyme Ab/Western Blot Reflex - Rocky mtn spotted fvr ab, IgG-blood  2. Diffuse arthralgia Onset over the past 2 weeks with last tick bite on the left posterior shoulder. No fever or redness of joints. Shoulders, hips, elbows, wrists, fingers, knees and ankles involved. No relief from Meloxicam or Ibuprofen. May add Tylenol prn with moist heat applications. Treat for suspected tick borne disease. Recheck pending lab reports. - doxycycline (VIBRA-TABS) 100 MG tablet; Take 1 tablet (100 mg total) by mouth 2 (two) times daily.  Dispense: 20 tablet; Refill: 0 - CBC with Differential/Platelet - Sedimentation rate - Lyme Ab/Western Blot Reflex - Rocky mtn spotted fvr ab, IgG-blood       Vernie Murders, PA  [employer]

## 2018-08-27 ENCOUNTER — Telehealth: Payer: Self-pay

## 2018-08-27 NOTE — Telephone Encounter (Signed)
-----   Message from Margo Common, [place] sent at 08/26/2018  7:08 PM EDT ----- Blood counts unchanged from thalassemia readings in the past year. Remainder of tests for RMSF and Lyme disease pending. Proceed with antibiotic.

## 2018-08-27 NOTE — Telephone Encounter (Signed)
Patient advised as below.  

## 2018-08-28 ENCOUNTER — Telehealth: Payer: Self-pay

## 2018-08-28 LAB — CBC WITH DIFFERENTIAL/PLATELET
Basophils Absolute: 0.1 10*3/uL (ref 0.0–0.2)
Basos: 1 %
EOS (ABSOLUTE): 0.2 10*3/uL (ref 0.0–0.4)
Eos: 4 %
Hematocrit: 34.1 % — ABNORMAL LOW (ref 37.5–51.0)
Hemoglobin: 10.6 g/dL — ABNORMAL LOW (ref 13.0–17.7)
Immature Grans (Abs): 0 10*3/uL (ref 0.0–0.1)
Immature Granulocytes: 0 %
Lymphocytes Absolute: 1.4 10*3/uL (ref 0.7–3.1)
Lymphs: 25 %
MCH: 19.8 pg — ABNORMAL LOW (ref 26.6–33.0)
MCHC: 31.1 g/dL — ABNORMAL LOW (ref 31.5–35.7)
MCV: 64 fL — ABNORMAL LOW (ref 79–97)
Monocytes Absolute: 0.7 10*3/uL (ref 0.1–0.9)
Monocytes: 12 %
Neutrophils Absolute: 3.3 10*3/uL (ref 1.4–7.0)
Neutrophils: 58 %
Platelets: 207 10*3/uL (ref 150–450)
RBC: 5.36 x10E6/uL (ref 4.14–5.80)
RDW: 17.6 % — ABNORMAL HIGH (ref 12.3–15.4)
WBC: 5.6 10*3/uL (ref 3.4–10.8)

## 2018-08-28 LAB — ROCKY MTN SPOTTED FVR AB, IGG-BLOOD: RMSF IgG: POSITIVE — AB

## 2018-08-28 LAB — LYME AB/WESTERN BLOT REFLEX
LYME DISEASE AB, QUANT, IGM: <0.8 index (ref 0.00–0.79)
Lyme IgG/IgM Ab: <0.91 {ISR} (ref 0.00–0.90)

## 2018-08-28 LAB — RMSF, IGG, IFA: RMSF, IGG, IFA: <1:64 {titer}

## 2018-08-28 LAB — SEDIMENTATION RATE: Sed Rate: 20 mm/hr (ref 0–30)

## 2018-08-28 NOTE — Telephone Encounter (Signed)
-----   Message from Margo Common, [place] sent at 08/28/2018  7:58 AM EDT ----- Lyme test negative. RMSF results pending. Finish all the antibiotic.

## 2018-08-28 NOTE — Telephone Encounter (Signed)
Patient advised.KW 

## 2018-08-29 ENCOUNTER — Telehealth: Payer: Self-pay

## 2018-08-29 NOTE — Telephone Encounter (Signed)
-----   Message from Margo Common, [place] sent at 08/28/2018  5:15 PM EDT ----- Tests positive for [facility] Spotted Fever. Finish all the Doxycycline and schedule recheck of progress in a week.

## 2018-08-29 NOTE — Telephone Encounter (Signed)
Pt advised.  Apt made for 09/06/2018  Thanks,   -Mickel Baas

## 2018-08-31 ENCOUNTER — Encounter: Payer: BLUE CROSS/BLUE SHIELD | Admitting: Podiatry

## 2018-09-02 NOTE — Progress Notes (Signed)
This encounter was created in error - please disregard.

## 2018-09-05 ENCOUNTER — Other Ambulatory Visit: Payer: Self-pay

## 2018-09-05 ENCOUNTER — Encounter: Payer: Self-pay | Admitting: Urology

## 2018-09-05 ENCOUNTER — Ambulatory Visit (INDEPENDENT_AMBULATORY_CARE_PROVIDER_SITE_OTHER): Payer: BLUE CROSS/BLUE SHIELD | Admitting: Urology

## 2018-09-05 VITALS — BP 143/81 | HR 83 | Ht 66.0 in | Wt 282.2 lb

## 2018-09-05 DIAGNOSIS — C61 Malignant neoplasm of prostate: Secondary | ICD-10-CM | POA: Diagnosis not present

## 2018-09-05 NOTE — Progress Notes (Signed)
09/05/2018 8:40 AM   Justin Soto 1961-01-18 [id number]  Referring provider: Jerrol Banana., MD [address], [place] [address]  Chief Complaint  Patient presents with  . 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  He called for follow-up to discuss surgical treatment options.  He is not interested in radiation.  He is an ideal candidate for active surveillance.   PMH: Past Medical History:  Diagnosis Date  . Anemia   . Enlarged prostate    hx of  . Environmental and seasonal allergies   . Frequency of urination   . GERD (gastroesophageal reflux disease)   . History of kidney stones   . HOH (hard of hearing)    wears bilateral hearing aids  . Hypertension   . Irregular heart beat    Pt feels this every once in a while; PCP aware but stated it was OK  . Kidney stone on right side 01/15/2017  . Leg pain, right    Take Lyrica and Naproxen  . Neck pain of over 3 months duration   . Sleep apnea   . Thalassemia   . Tick fever     Surgical History: Past Surgical History:  Procedure Laterality Date  . ANTERIOR CERVICAL DECOMP/DISCECTOMY FUSION N/A 11/13/2014   Procedure: Cervical three-four, Cervical six-seven anterior cervical decompression with fusion interbody prosthesis plating and bonegraft;  Surgeon: Newman Pies, MD;  Location: [facility];  Service: Neurosurgery;  Laterality: N/A;  Cervical three-four, Cervical six-seven anterior cervical decompression with fusion interbody prosthesis plating and bonegraft  . BACK SURGERY     Lumbar X 2  . CALCANEAL OSTEOTOMY Right 01/11/2017   Procedure: CALCANEAL OSTEOTOMY;  Surgeon: Earnestine Leys, MD;  Location: [facility];  Service: Orthopedics;  Laterality: Right;  . CIRCUMCISION N/A 12/13/2017   Procedure: PENILE BLOCK, LYSIS OF PENILE ADHESIONS;  Surgeon: Hollice Espy, MD;  Location: [facility];  Service: Urology;   Laterality: N/A;  . COLONOSCOPY    . CYSTOSCOPY N/A 12/13/2017   Procedure: CYSTOSCOPY;  Surgeon: Hollice Espy, MD;  Location: [facility];  Service: Urology;  Laterality: N/A;  . HERNIA REPAIR Left    groin  . LEG SURGERY Right    Had surgery on right calf X 5  . LITHOTRIPSY    . QUADRICEPS TENDON REPAIR Left 06/22/2015   Procedure: REPAIR QUADRICEP TENDON;  Surgeon: Earnestine Leys, MD;  Location: [facility];  Service: Orthopedics;  Laterality: Left;  Marland Kitchen QUADRICEPS TENDON REPAIR Left 08/12/2015   Procedure: REPAIR QUADRICEP TENDON;  Surgeon: Earnestine Leys, MD;  Location: [facility];  Service: Orthopedics;  Laterality: Left;  . ROTATOR CUFF REPAIR Left   . TONSILLECTOMY    . VASECTOMY     with hydrocele repaired    Home Medications:  Allergies as of 09/05/2018      Reactions   Methocarbamol Other (See Comments)   Numbness, and change of taste    Vicodin [hydrocodone-acetaminophen] Itching   Has to use benedryl   Tizanidine Nausea Only      Medication List        Accurate as of 09/05/18  8:40 AM. Always use your most recent med list.          amLODipine-benazepril 10-40 MG capsule Commonly known as:  LOTREL Take 1 capsule by mouth daily.   aspirin EC 81 MG tablet Take 81 mg by mouth daily.   cetirizine 10 MG  tablet Commonly known as:  ZYRTEC 1 to 2 tablets daily as needed   doxycycline 100 MG tablet Commonly known as:  VIBRA-TABS Take 1 tablet (100 mg total) by mouth 2 (two) times daily.   finasteride 5 MG tablet Commonly known as:  PROSCAR TAKE ONE (1) TABLET BY MOUTH EVERY DAY   fluticasone 50 MCG/ACT nasal spray Commonly known as:  FLONASE Place 2 sprays into both nostrils daily.   hydrochlorothiazide 25 MG tablet Commonly known as:  HYDRODIURIL TAKE ONE TABLET BY MOUTH EVERY DAY   meloxicam 15 MG tablet Commonly known as:  MOBIC Take 1 tablet (15 mg total) by mouth daily.   metoprolol tartrate 25 MG tablet Commonly known as:  LOPRESSOR Take 1 tablet (25 mg  total) by mouth daily.   montelukast 10 MG tablet Commonly known as:  SINGULAIR TAKE ONE TABLET BY MOUTH EVERY MORNING   multivitamin with minerals Tabs tablet Take 1 tablet by mouth daily.   omeprazole 10 MG capsule Commonly known as:  PRILOSEC Take 10 mg by mouth daily.   tamsulosin 0.4 MG Caps capsule Commonly known as:  FLOMAX TAKE TWO CAPSULES EACH DAY AS DIRECTED       Allergies:  Allergies  Allergen Reactions  . Methocarbamol Other (See Comments)    Numbness, and change of taste   . Vicodin [Hydrocodone-Acetaminophen] Itching    Has to use benedryl  . Tizanidine Nausea Only    Family History: Family History  Problem Relation Age of Onset  . Prostate cancer Father   . Heart disease Father   . Diabetes Father   . Alzheimer's disease Father   . Anemia Mother   . Osteoporosis Mother   . Diabetes Brother   . Hypertension Brother   . Anemia Daughter   . Kidney disease Neg Hx   . Bladder Cancer Neg Hx     Social History:  reports that he has never smoked. He has never used smokeless tobacco. He reports that he does not drink alcohol or use drugs.  ROS: UROLOGY Frequent Urination?: Yes Hard to postpone urination?: No Burning/pain with urination?: No Get up at night to urinate?: No Leakage of urine?: No Urine stream starts and stops?: No Trouble starting stream?: No Do you have to strain to urinate?: No Blood in urine?: No Urinary tract infection?: No Sexually transmitted disease?: No Injury to kidneys or bladder?: No Painful intercourse?: No Weak stream?: No Erection problems?: No Penile pain?: No  Gastrointestinal Nausea?: No Vomiting?: No Indigestion/heartburn?: No Diarrhea?: No Constipation?: No  Constitutional Fever: No Night sweats?: No Weight loss?: No Fatigue?: No  Skin Skin rash/lesions?: No Itching?: No  Eyes Blurred vision?: No Double vision?: No  Ears/Nose/Throat Sore throat?: No Sinus problems?:  No  Hematologic/Lymphatic Swollen glands?: No Easy bruising?: No  Cardiovascular Leg swelling?: No Chest pain?: No  Respiratory Cough?: No Shortness of breath?: No  Endocrine Excessive thirst?: No  Musculoskeletal Back pain?: No Joint pain?: No  Neurological Headaches?: No Dizziness?: No  Psychologic Depression?: No Anxiety?: No  Physical Exam: BP (!) 143/81   Pulse 83   Ht 5\' 6"  (1.676 m)   Wt 282 lb 3.2 oz (128 kg)   BMI 45.55 kg/m   Constitutional:  Alert and oriented, No acute distress.   Assessment & Plan:   57 year old male with T1c low risk prostate cancer.  Radical prostatectomy was discussed including potential side effects of ED and less commonly incontinence.  He was informed the majority of prostate surgery  for cancer is performed robotically with the laparoscope and he would need to be referred to 1 of my partners.  I again reviewed the rationale of active surveillance.  After discussing this further he has elected active surveillance.  We discussed the need for a follow-up biopsy 9 to 12 months after his initial biopsy.  Would also recommend a prostate MRI to assess for high-grade lesions prior to confirmatory biopsy.   Return in about 3 months (around 12/05/2018) for Recheck, PSA.  Abbie Sons, [place] [address], [place], [place] [address] [phone]

## 2018-09-06 ENCOUNTER — Encounter: Payer: Self-pay | Admitting: Family Medicine

## 2018-09-06 ENCOUNTER — Ambulatory Visit: Payer: BLUE CROSS/BLUE SHIELD | Admitting: Family Medicine

## 2018-09-06 VITALS — BP 124/76 | HR 78 | Temp 98.6°F | Wt 284.0 lb

## 2018-09-06 DIAGNOSIS — M2550 Pain in unspecified joint: Secondary | ICD-10-CM | POA: Diagnosis not present

## 2018-09-06 DIAGNOSIS — A770 Spotted fever due to Rickettsia rickettsii: Secondary | ICD-10-CM | POA: Diagnosis not present

## 2018-09-06 MED ORDER — PREDNISONE 5 MG PO TABS: ORAL_TABLET | ORAL | 0 refills | Status: DC

## 2018-09-06 MED ORDER — DOXYCYCLINE HYCLATE 100 MG PO TABS: 100.0000 mg | ORAL_TABLET | Freq: Two times a day (BID) | ORAL | 0 refills | Status: DC

## 2018-09-06 NOTE — Progress Notes (Signed)
Patient: Justin Soto Male    DOB: June 28, 1961   57 y.o.   MRN: [id number] Visit Date: 09/06/2018  Today's Provider: Vernie Murders, PA   Chief Complaint  Patient presents with  . Tick Removal    One week follow up.  Positive for RMSF.    Subjective:    HPI   Pt comes in today for a follow up on RMSF.  He finished Doxycycline yesterday.  He states he feels slightly better but not back to baseline.  Pt says he is still "hurting all over" feels fatigued.  He denies fevers, chills.      Past Medical History:  Diagnosis Date  . Anemia   . Enlarged prostate    hx of  . Environmental and seasonal allergies   . Frequency of urination   . GERD (gastroesophageal reflux disease)   . History of kidney stones   . HOH (hard of hearing)    wears bilateral hearing aids  . Hypertension   . Irregular heart beat    Pt feels this every once in a while; PCP aware but stated it was OK  . Kidney stone on right side 01/15/2017  . Leg pain, right    Take Lyrica and Naproxen  . Neck pain of over 3 months duration   . Sleep apnea   . Thalassemia   . Tick fever    Past Surgical History:  Procedure Laterality Date  . ANTERIOR CERVICAL DECOMP/DISCECTOMY FUSION N/A 11/13/2014   Procedure: Cervical three-four, Cervical six-seven anterior cervical decompression with fusion interbody prosthesis plating and bonegraft;  Surgeon: Newman Pies, MD;  Location: [facility];  Service: Neurosurgery;  Laterality: N/A;  Cervical three-four, Cervical six-seven anterior cervical decompression with fusion interbody prosthesis plating and bonegraft  . BACK SURGERY     Lumbar X 2  . CALCANEAL OSTEOTOMY Right 01/11/2017   Procedure: CALCANEAL OSTEOTOMY;  Surgeon: Earnestine Leys, MD;  Location: [facility];  Service: Orthopedics;  Laterality: Right;  . CIRCUMCISION N/A 12/13/2017   Procedure: PENILE BLOCK, LYSIS OF PENILE ADHESIONS;  Surgeon: Hollice Espy, MD;  Location: [facility];  Service: Urology;  Laterality: N/A;    . COLONOSCOPY    . CYSTOSCOPY N/A 12/13/2017   Procedure: CYSTOSCOPY;  Surgeon: Hollice Espy, MD;  Location: [facility];  Service: Urology;  Laterality: N/A;  . HERNIA REPAIR Left    groin  . LEG SURGERY Right    Had surgery on right calf X 5  . LITHOTRIPSY    . QUADRICEPS TENDON REPAIR Left 06/22/2015   Procedure: REPAIR QUADRICEP TENDON;  Surgeon: Earnestine Leys, MD;  Location: [facility];  Service: Orthopedics;  Laterality: Left;  Marland Kitchen QUADRICEPS TENDON REPAIR Left 08/12/2015   Procedure: REPAIR QUADRICEP TENDON;  Surgeon: Earnestine Leys, MD;  Location: [facility];  Service: Orthopedics;  Laterality: Left;  . ROTATOR CUFF REPAIR Left   . TONSILLECTOMY    . VASECTOMY     with hydrocele repaired   Family History  Problem Relation Age of Onset  . Prostate cancer Father   . Heart disease Father   . Diabetes Father   . Alzheimer's disease Father   . Anemia Mother   . Osteoporosis Mother   . Diabetes Brother   . Hypertension Brother   . Anemia Daughter   . Kidney disease Neg Hx   . Bladder Cancer Neg Hx    Allergies  Allergen Reactions  . Methocarbamol Other (See Comments)    Numbness, and  change of taste   . Vicodin [Hydrocodone-Acetaminophen] Itching    Has to use benedryl  . Tizanidine Nausea Only    Current Outpatient Medications:  .  amLODipine-benazepril (LOTREL) 10-40 MG capsule, Take 1 capsule by mouth daily., Disp: 90 capsule, Rfl: 3 .  aspirin EC 81 MG tablet, Take 81 mg by mouth daily., Disp: , Rfl:  .  cetirizine (ZYRTEC) 10 MG tablet, 1 to 2 tablets daily as needed (Patient taking differently: Take 10 mg by mouth daily. ), Disp: 60 tablet, Rfl: 12 .  finasteride (PROSCAR) 5 MG tablet, TAKE ONE (1) TABLET BY MOUTH EVERY DAY, Disp: 90 tablet, Rfl: 0 .  fluticasone (FLONASE) 50 MCG/ACT nasal spray, Place 2 sprays into both nostrils daily. (Patient taking differently: Place 2 sprays into both nostrils daily as needed. ), Disp: 16 g, Rfl: 12 .  hydrochlorothiazide  (HYDRODIURIL) 25 MG tablet, TAKE ONE TABLET BY MOUTH EVERY DAY, Disp: 90 tablet, Rfl: 3 .  meloxicam (MOBIC) 15 MG tablet, Take 1 tablet (15 mg total) by mouth daily., Disp: 30 tablet, Rfl: 0 .  metoprolol tartrate (LOPRESSOR) 25 MG tablet, Take 1 tablet (25 mg total) by mouth daily., Disp: 90 tablet, Rfl: 3 .  montelukast (SINGULAIR) 10 MG tablet, TAKE ONE TABLET BY MOUTH EVERY MORNING, Disp: 90 tablet, Rfl: 3 .  Multiple Vitamin (MULTIVITAMIN WITH MINERALS) TABS tablet, Take 1 tablet by mouth daily., Disp: , Rfl:  .  omeprazole (PRILOSEC) 10 MG capsule, Take 10 mg by mouth daily., Disp: , Rfl:  .  tamsulosin (FLOMAX) 0.4 MG CAPS capsule, TAKE TWO CAPSULES EACH DAY AS DIRECTED, Disp: 60 capsule, Rfl: 6 .  doxycycline (VIBRA-TABS) 100 MG tablet, Take 1 tablet (100 mg total) by mouth 2 (two) times daily. (Patient not taking: Reported on 09/06/2018), Disp: 20 tablet, Rfl: 0  Review of Systems  Constitutional: Positive for fatigue. Negative for activity change, appetite change, chills, diaphoresis, fever and unexpected weight change.  HENT: Positive for congestion, rhinorrhea and sore throat (Over the weekend ). Negative for sinus pressure and sinus pain.   Respiratory: Positive for cough. Negative for apnea, choking, chest tightness, shortness of breath, wheezing and stridor.   Gastrointestinal: Negative.   Musculoskeletal: Positive for arthralgias and myalgias. Negative for back pain, gait problem, joint swelling, neck pain and neck stiffness.  Neurological: Positive for light-headedness (If he stands too fast.). Negative for dizziness and headaches.   Social History   Tobacco Use  . Smoking status: Never Smoker  . Smokeless tobacco: Never Used  Substance Use Topics  . Alcohol use: No   Objective:   BP 124/76 (BP Location: Right Arm, Patient Position: Sitting, Cuff Size: Large)   Pulse 78   Temp 98.6 F (37 C) (Oral)   Wt 284 lb (128.8 kg)   SpO2 95%   BMI 45.84 kg/m   Wt Readings  from Last 3 Encounters:  09/06/18 284 lb (128.8 kg)  09/05/18 282 lb 3.2 oz (128 kg)  08/24/18 285 lb (129.3 kg)   Vitals:   09/06/18 0818  BP: 124/76  Pulse: 78  Temp: 98.6 F (37 C)  TempSrc: Oral  SpO2: 95%  Weight: 284 lb (128.8 kg)   Physical Exam  Constitutional: He is oriented to person, place, and time. He appears well-developed and well-nourished. No distress.  HENT:  Head: Normocephalic and atraumatic.  Right Ear: Hearing normal.  Left Ear: Hearing normal.  Nose: Nose normal.  Eyes: Conjunctivae and lids are normal. Right eye exhibits no  discharge. Left eye exhibits no discharge. No scleral icterus.  Cardiovascular: Normal rate.  Pulmonary/Chest: Effort normal and breath sounds normal. No respiratory distress.  Abdominal: Soft.  Musculoskeletal:  Multiple joint aches and stiffness in the mornings. No erythema or swelling.  Neurological: He is alert and oriented to person, place, and time.  Skin: Skin is intact. No lesion and no rash noted.  Psychiatric: He has a normal mood and affect. His speech is normal and behavior is normal. Thought content normal.      Assessment & Plan:     1. RMSF [facility] spotted fever) Positive titer for RMSF on 08-24-18. Still has some fatigue and general malaise with diffuse arthralgias. No rashes and tick bite on the left posterior shoulder has resolved. Has been on the Doxycycline for 10 days. Will refill this antibiotic and recheck in 10-14 days. - doxycycline (VIBRA-TABS) 100 MG tablet; Take 1 tablet (100 mg total) by mouth 2 (two) times daily.  Dispense: 20 tablet; Refill: 0  2. Diffuse arthralgia Generalized aches and stiffness. No fevers or red joints. No swelling noted. Will give prednisone taper for 6 days and recheck in 10-14 days. If no better, may need ID referral. - doxycycline (VIBRA-TABS) 100 MG tablet; Take 1 tablet (100 mg total) by mouth 2 (two) times daily.  Dispense: 20 tablet; Refill: [place]  [facility]

## 2018-09-20 ENCOUNTER — Ambulatory Visit: Payer: BLUE CROSS/BLUE SHIELD | Admitting: Family Medicine

## 2018-09-20 ENCOUNTER — Ambulatory Visit
Admission: RE | Admit: 2018-09-20 | Discharge: 2018-09-20 | Disposition: A | Discharge status: Home / Self Care | Payer: BLUE CROSS/BLUE SHIELD | Source: Ambulatory Visit | Attending: Family Medicine | Admitting: Family Medicine

## 2018-09-20 ENCOUNTER — Other Ambulatory Visit: Payer: Self-pay | Admitting: Family Medicine

## 2018-09-20 ENCOUNTER — Encounter: Payer: Self-pay | Admitting: Family Medicine

## 2018-09-20 VITALS — BP 130/82 | HR 74 | Temp 98.0°F | Wt 287.2 lb

## 2018-09-20 DIAGNOSIS — M542 Cervicalgia: Secondary | ICD-10-CM | POA: Diagnosis not present

## 2018-09-20 DIAGNOSIS — M4712 Other spondylosis with myelopathy, cervical region: Secondary | ICD-10-CM | POA: Diagnosis not present

## 2018-09-20 DIAGNOSIS — M4722 Other spondylosis with radiculopathy, cervical region: Secondary | ICD-10-CM | POA: Diagnosis not present

## 2018-09-20 DIAGNOSIS — M7918 Myalgia, other site: Secondary | ICD-10-CM | POA: Diagnosis not present

## 2018-09-20 DIAGNOSIS — A770 Spotted fever due to Rickettsia rickettsii: Secondary | ICD-10-CM

## 2018-09-20 DIAGNOSIS — M47817 Spondylosis without myelopathy or radiculopathy, lumbosacral region: Secondary | ICD-10-CM | POA: Diagnosis not present

## 2018-09-20 MED ORDER — CYCLOBENZAPRINE HCL 5 MG PO TABS: 5.0000 mg | ORAL_TABLET | Freq: Three times a day (TID) | ORAL | 1 refills | Status: DC | PRN
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
## 2018-09-26 DIAGNOSIS — G4733 Obstructive sleep apnea (adult) (pediatric): Secondary | ICD-10-CM | POA: Diagnosis not present

## 2018-10-25 ENCOUNTER — Encounter: Payer: Self-pay | Admitting: Family Medicine

## 2018-10-25 ENCOUNTER — Other Ambulatory Visit: Payer: Self-pay

## 2018-10-25 ENCOUNTER — Ambulatory Visit: Payer: BLUE CROSS/BLUE SHIELD | Admitting: Family Medicine

## 2018-10-25 VITALS — BP 138/76 | HR 93 | Temp 98.2°F | Ht 66.0 in | Wt 288.6 lb

## 2018-10-25 DIAGNOSIS — M4722 Other spondylosis with radiculopathy, cervical region: Secondary | ICD-10-CM | POA: Diagnosis not present

## 2018-10-25 DIAGNOSIS — M5431 Sciatica, right side: Secondary | ICD-10-CM

## 2018-10-25 DIAGNOSIS — M4712 Other spondylosis with myelopathy, cervical region: Secondary | ICD-10-CM | POA: Diagnosis not present

## 2018-10-25 DIAGNOSIS — Z9889 Other specified postprocedural states: Secondary | ICD-10-CM | POA: Diagnosis not present

## 2018-10-25 MED ORDER — PREDNISONE 10 MG PO TABS: 10.0000 mg | ORAL_TABLET | Freq: Every day | ORAL | 0 refills | Status: DC
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
## 2018-10-31 ENCOUNTER — Other Ambulatory Visit: Payer: Self-pay | Admitting: Urology

## 2018-11-01 DIAGNOSIS — M7541 Impingement syndrome of right shoulder: Secondary | ICD-10-CM | POA: Diagnosis not present

## 2018-11-01 DIAGNOSIS — M7061 Trochanteric bursitis, right hip: Secondary | ICD-10-CM | POA: Insufficient documentation

## 2018-11-05 ENCOUNTER — Encounter: Payer: Self-pay | Admitting: Physician Assistant

## 2018-11-05 ENCOUNTER — Ambulatory Visit: Payer: BLUE CROSS/BLUE SHIELD | Admitting: Physician Assistant

## 2018-11-05 VITALS — BP 142/82 | HR 73 | Temp 99.3°F | Resp 16 | Wt 285.0 lb

## 2018-11-05 DIAGNOSIS — J440 Chronic obstructive pulmonary disease with acute lower respiratory infection: Secondary | ICD-10-CM

## 2018-11-05 DIAGNOSIS — J209 Acute bronchitis, unspecified: Secondary | ICD-10-CM

## 2018-11-05 DIAGNOSIS — J0140 Acute pansinusitis, unspecified: Secondary | ICD-10-CM | POA: Diagnosis not present

## 2018-11-05 MED ORDER — FLUTICASONE PROPIONATE HFA 44 MCG/ACT IN AERO: 2.0000 | INHALATION_SPRAY | Freq: Two times a day (BID) | RESPIRATORY_TRACT | 12 refills | Status: DC

## 2018-11-05 MED ORDER — AMOXICILLIN-POT CLAVULANATE 875-125 MG PO TABS: 1.0000 | ORAL_TABLET | Freq: Two times a day (BID) | ORAL | 0 refills | Status: DC

## 2018-11-05 NOTE — Progress Notes (Signed)
Patient: Justin Soto Male    DOB: January 10, 1961   57 y.o.   MRN: [id number] Visit Date: 11/05/2018  Today's Provider: Mar Daring, PA-C   Chief Complaint  Patient presents with  . Sinusitis   Subjective:    Sinusitis  This is a new problem. The current episode started in the past 7 days. The problem has been gradually worsening since onset. There has been no fever. He is experiencing no pain. Associated symptoms include congestion, coughing, ear pain, headaches, neck pain and sinus pressure. Pertinent negatives include no chills, shortness of breath or sore throat. Past treatments include oral decongestants (Tylenol Sinus Patient was taking Predinose andfinished it on Friday). The treatment provided no relief.      Allergies  Allergen Reactions  . Methocarbamol Other (See Comments)    Numbness, and change of taste   . Vicodin [Hydrocodone-Acetaminophen] Itching    Has to use benedryl  . Tizanidine Nausea Only     Current Outpatient Medications:  .  amLODipine-benazepril (LOTREL) 10-40 MG capsule, Take 1 capsule by mouth daily., Disp: 90 capsule, Rfl: 3 .  aspirin EC 81 MG tablet, Take 81 mg by mouth daily., Disp: , Rfl:  .  cetirizine (ZYRTEC) 10 MG tablet, 1 to 2 tablets daily as needed (Patient taking differently: Take 10 mg by mouth daily. ), Disp: 60 tablet, Rfl: 12 .  cyclobenzaprine (FLEXERIL) 5 MG tablet, Take 1 tablet (5 mg total) by mouth 3 (three) times daily as needed for muscle spasms., Disp: 30 tablet, Rfl: 1 .  finasteride (PROSCAR) 5 MG tablet, TAKE ONE (1) TABLET BY MOUTH EVERY DAY, Disp: 90 tablet, Rfl: 0 .  fluticasone (FLONASE) 50 MCG/ACT nasal spray, USE TWO SPRAYS IN EACH NOSTRIL EVERY DAY, Disp: 48 g, Rfl: 4 .  hydrochlorothiazide (HYDRODIURIL) 25 MG tablet, TAKE ONE TABLET BY MOUTH EVERY DAY, Disp: 90 tablet, Rfl: 3 .  meloxicam (MOBIC) 15 MG tablet, Take 1 tablet (15 mg total) by mouth daily., Disp: 30 tablet, Rfl: 0 .  metoprolol tartrate  (LOPRESSOR) 25 MG tablet, Take 1 tablet (25 mg total) by mouth daily., Disp: 90 tablet, Rfl: 3 .  montelukast (SINGULAIR) 10 MG tablet, TAKE ONE TABLET BY MOUTH EVERY MORNING, Disp: 90 tablet, Rfl: 3 .  Multiple Vitamin (MULTIVITAMIN WITH MINERALS) TABS tablet, Take 1 tablet by mouth daily., Disp: , Rfl:  .  omeprazole (PRILOSEC) 10 MG capsule, Take 10 mg by mouth daily., Disp: , Rfl:  .  tamsulosin (FLOMAX) 0.4 MG CAPS capsule, TAKE TWO CAPSULES EACH DAY AS DIRECTED, Disp: 60 capsule, Rfl: 6 .  doxycycline (VIBRA-TABS) 100 MG tablet, Take 1 tablet (100 mg total) by mouth 2 (two) times daily. (Patient not taking: Reported on 11/05/2018), Disp: 20 tablet, Rfl: 0 .  predniSONE (DELTASONE) 10 MG tablet, Take 1 tablet (10 mg total) by mouth daily with breakfast. Taper down by one tablet every other day (6,6,5,5,4,4,3,3,2,2,1,1) (Patient not taking: Reported on 11/05/2018), Disp: 42 tablet, Rfl: 0  Review of Systems  Constitutional: Negative for chills and fever.  HENT: Positive for congestion, ear pain, postnasal drip, rhinorrhea ("bloody"), sinus pressure and sinus pain. Negative for sore throat and trouble swallowing.   Respiratory: Positive for cough and chest tightness. Negative for shortness of breath and wheezing.   Cardiovascular: Negative for chest pain, palpitations and leg swelling.  Musculoskeletal: Positive for neck pain.  Neurological: Positive for headaches.    Social History   Tobacco Use  .  Smoking status: Never Smoker  . Smokeless tobacco: Never Used  Substance Use Topics  . Alcohol use: No   Objective:   BP (!) 142/82 (BP Location: Left Arm, Patient Position: Sitting, Cuff Size: Large)   Pulse 73   Temp 99.3 F (37.4 C) (Oral)   Resp 16   Wt 285 lb (129.3 kg)   SpO2 96%   BMI 46.00 kg/m  Vitals:   11/05/18 1103  BP: (!) 142/82  Pulse: 73  Resp: 16  Temp: 99.3 F (37.4 C)  TempSrc: Oral  SpO2: 96%  Weight: 285 lb (129.3 kg)     Physical Exam    Constitutional: He appears well-developed and well-nourished. No distress.  HENT:  Head: Normocephalic and atraumatic.  Right Ear: Hearing, external ear and ear canal normal. Tympanic membrane is not erythematous and not bulging. A middle ear effusion is present.  Left Ear: Hearing, external ear and ear canal normal. Tympanic membrane is not erythematous and not bulging. A middle ear effusion is present.  Nose: Mucosal edema and rhinorrhea present. Right sinus exhibits maxillary sinus tenderness. Right sinus exhibits no frontal sinus tenderness. Left sinus exhibits maxillary sinus tenderness. Left sinus exhibits no frontal sinus tenderness.  Mouth/Throat: Uvula is midline, oropharynx is clear and moist and mucous membranes are normal. No oropharyngeal exudate, posterior oropharyngeal edema or posterior oropharyngeal erythema.  Eyes: Pupils are equal, round, and reactive to light. Conjunctivae and EOM are normal. Right eye exhibits no discharge. Left eye exhibits no discharge.  Neck: Normal range of motion. Neck supple. No tracheal deviation present. No Brudzinski's sign and no Kernig's sign noted. No thyromegaly present.  Cardiovascular: Normal rate, regular rhythm and normal heart sounds. Exam reveals no gallop and no friction rub.  No murmur heard. Pulmonary/Chest: Effort normal. No stridor. No respiratory distress. He has decreased breath sounds. He has no wheezes. He has no rales.  Lymphadenopathy:    He has no cervical adenopathy.  Skin: Skin is warm and dry. He is not diaphoretic.  Vitals reviewed.      Assessment & Plan:     1. Acute non-recurrent pansinusitis Worsening symptoms that have not responded to OTC medications. Will give augmentin as below. Continue allergy medications. Stay well hydrated and get plenty of rest. Call if no symptom improvement or if symptoms worsen. - amoxicillin-clavulanate (AUGMENTIN) 875-125 MG tablet; Take 1 tablet by mouth 2 (two) times daily.   Dispense: 20 tablet; Refill: 0  2. Acute bronchitis with COPD ([place]) Will give flovent inhaler as below since patient just completed a round of oral antibiotic. Advised to make sure to wash mouth after use to prevent thrush.  - fluticasone (FLOVENT HFA) 44 MCG/ACT inhaler; Inhale 2 puffs into the lungs 2 (two) times daily.  Dispense: 1 Inhaler; Refill: [place], PA-C  [facility]

## 2018-11-05 NOTE — Patient Instructions (Signed)
Pelvic floor exercises/Kegel exercises   Kegel Exercises Kegel exercises help strengthen the muscles that support the rectum, vagina, small intestine, bladder, and uterus. Doing Kegel exercises can help:  Improve bladder and bowel control.  Improve sexual response.  Reduce problems and discomfort during pregnancy.  Kegel exercises involve squeezing your pelvic floor muscles, which are the same muscles you squeeze when you try to stop the flow of urine. The exercises can be done while sitting, standing, or lying down, but it is best to vary your position. Phase 1 exercises 1. Squeeze your pelvic floor muscles tight. You should feel a tight lift in your rectal area. If you are a male, you should also feel a tightness in your vaginal area. Keep your stomach, buttocks, and legs relaxed. 2. Hold the muscles tight for up to 10 seconds. 3. Relax your muscles. Repeat this exercise 50 times a day or as many times as told by your health care provider. Continue to do this exercise for at least 4-6 weeks or for as long as told by your health care provider. This information is not intended to replace advice given to you by your health care provider. Make sure you discuss any questions you have with your health care provider. Document Released: 11/14/2012 Document Revised: 07/23/2016 Document Reviewed: 10/18/2015 Elsevier Interactive Patient Education  [employer].

## 2018-12-03 DIAGNOSIS — I208 Other forms of angina pectoris: Secondary | ICD-10-CM | POA: Diagnosis not present

## 2018-12-03 DIAGNOSIS — I10 Essential (primary) hypertension: Secondary | ICD-10-CM | POA: Diagnosis not present

## 2018-12-03 DIAGNOSIS — E782 Mixed hyperlipidemia: Secondary | ICD-10-CM | POA: Diagnosis not present

## 2018-12-06 ENCOUNTER — Other Ambulatory Visit: Payer: BLUE CROSS/BLUE SHIELD

## 2018-12-06 DIAGNOSIS — C61 Malignant neoplasm of prostate: Secondary | ICD-10-CM

## 2018-12-07 LAB — PSA: Prostate Specific Ag, Serum: 2.4 ng/mL (ref 0.0–4.0)

## 2018-12-08 IMAGING — CT CT RENAL STONE PROTOCOL
2 of 4 series · 16 of 46 positions shown, 18 images · non-contrast
Comparison: CT scan 03/09/2011

CLINICAL DATA: Acute right-sided flank pain. History of kidney
stones. Prior lithotripsy.

EXAM:
CT ABDOMEN AND PELVIS WITHOUT CONTRAST
TECHNIQUE: Multidetector CT imaging of the abdomen and pelvis was performed
following the standard protocol without IV contrast.

[Series 2: soft tissue · axial · 0.89mm/px · z∈[-851,-391]mm · 13 of 101 slices shown, 15 images]
[im 5/101  soft-tissue]
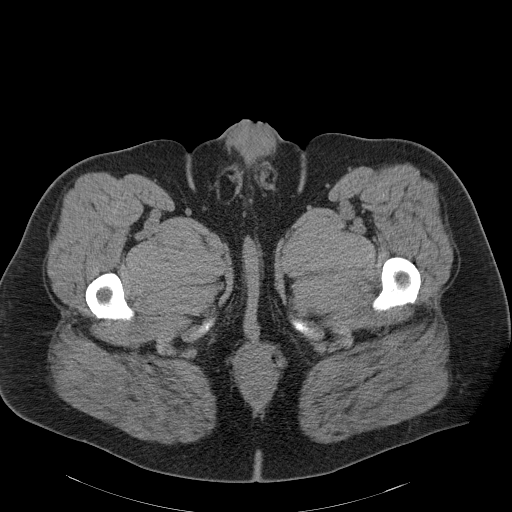
[im 5/101  bone]
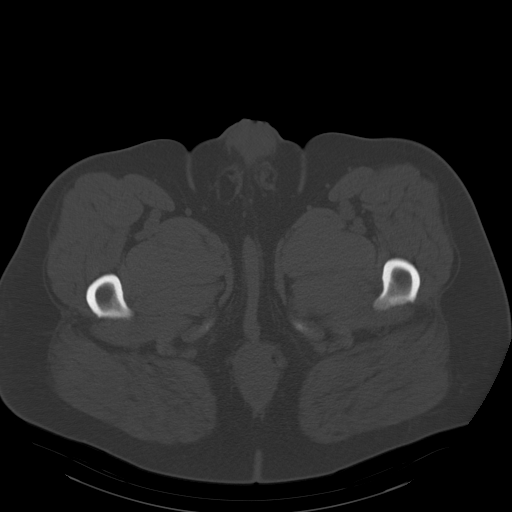
[im 13/101  soft-tissue]
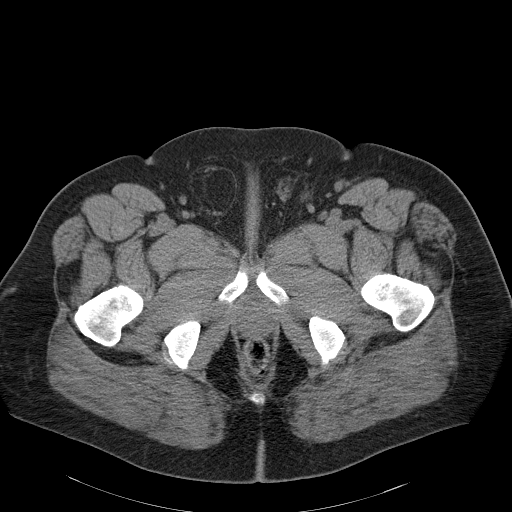
[im 21/101  soft-tissue]
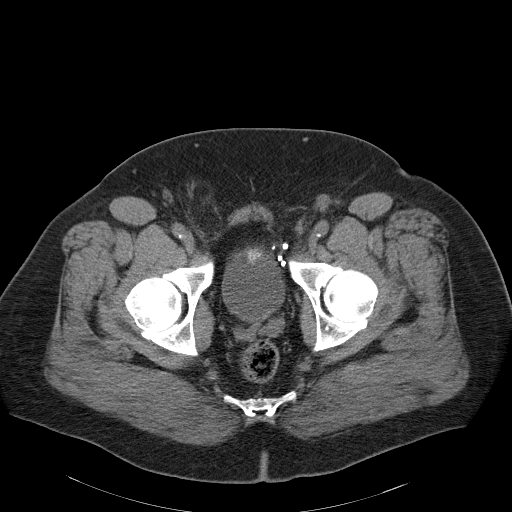
[im 29/101  soft-tissue]
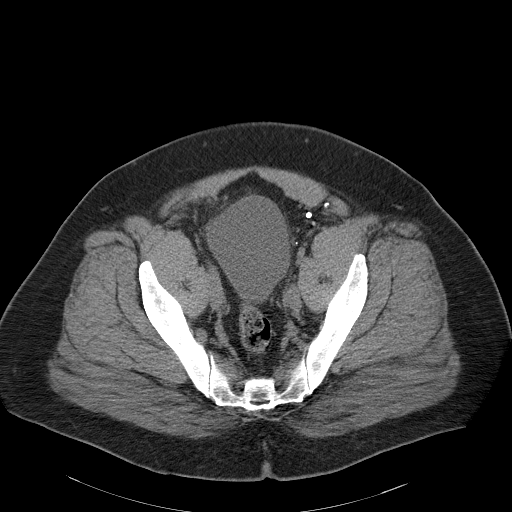
[im 37/101  soft-tissue]
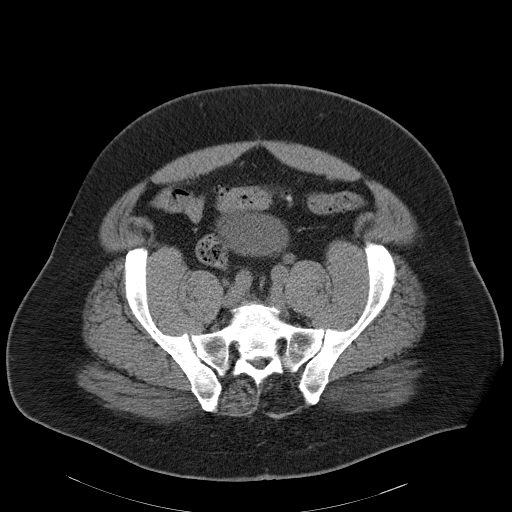
[im 45/101  soft-tissue]
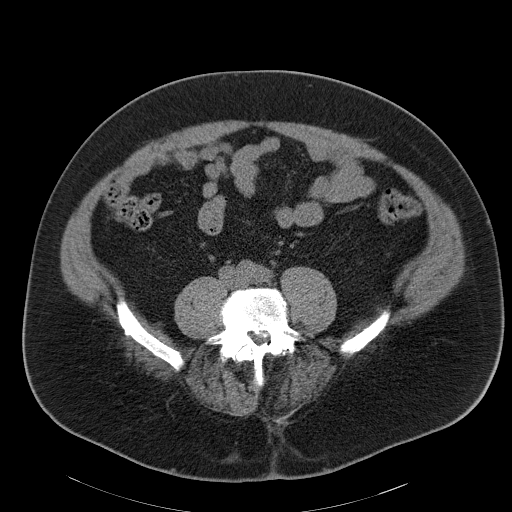
[im 53/101  soft-tissue]
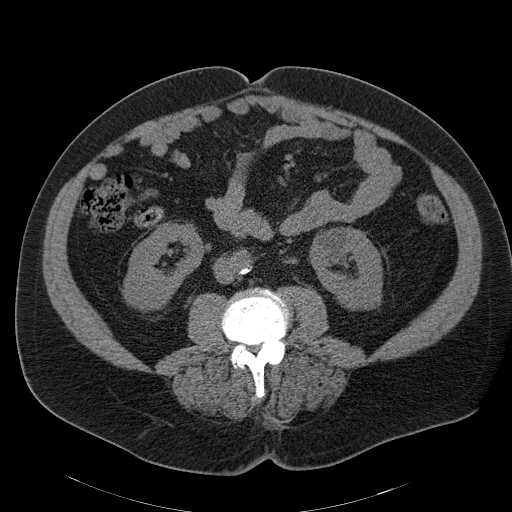
[im 57/101  soft-tissue]
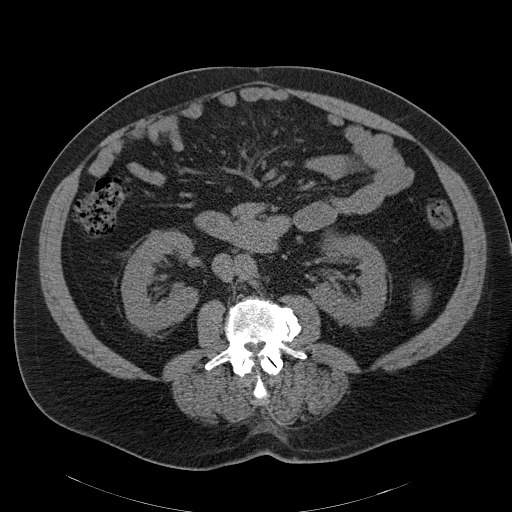
[im 65/101  soft-tissue]
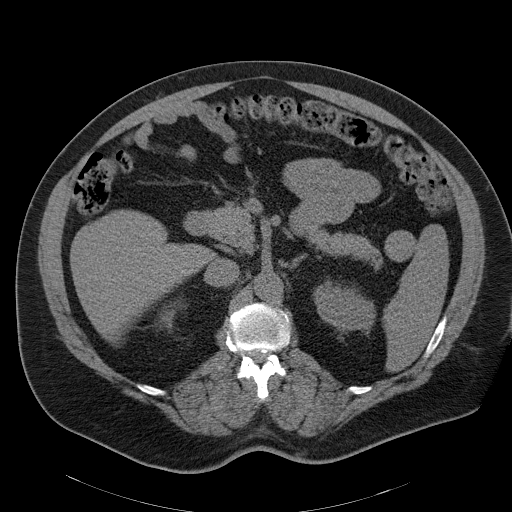
[im 65/101  bone]
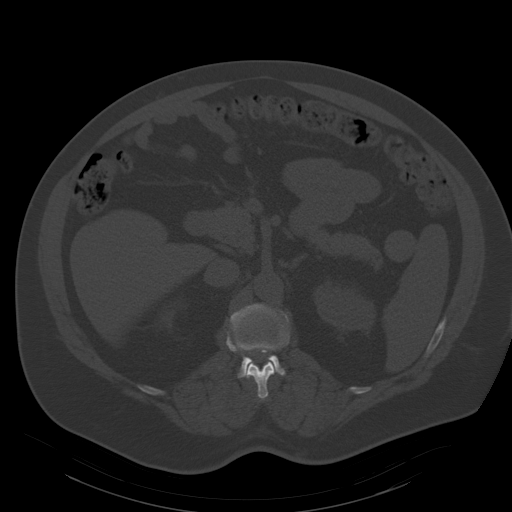
[im 73/101  soft-tissue]
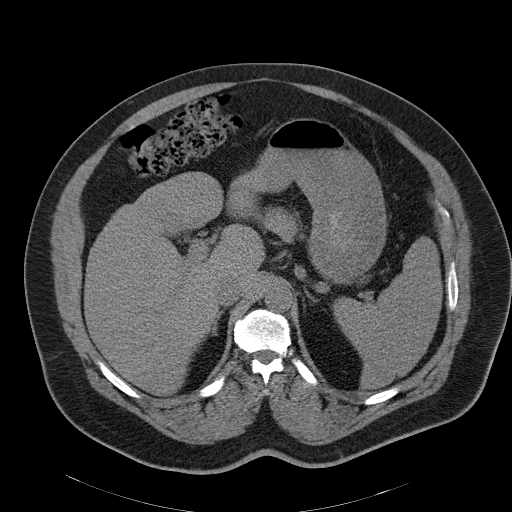
[im 81/101  soft-tissue]
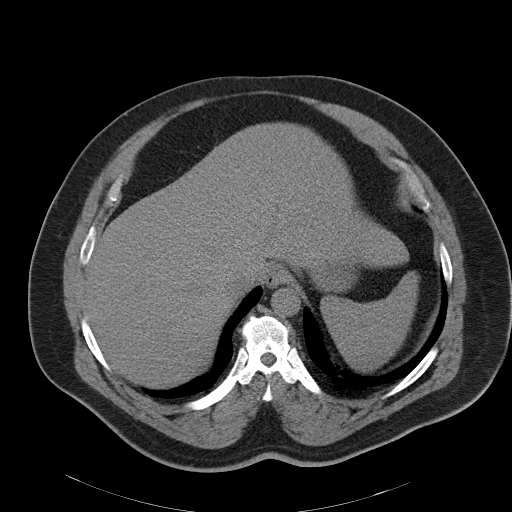
[im 89/101  soft-tissue]
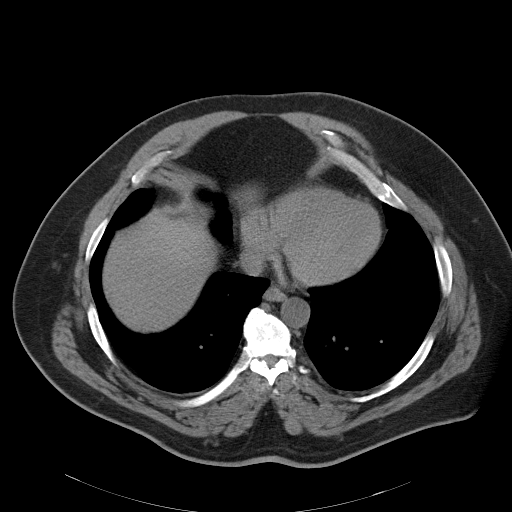
[im 97/101  soft-tissue]
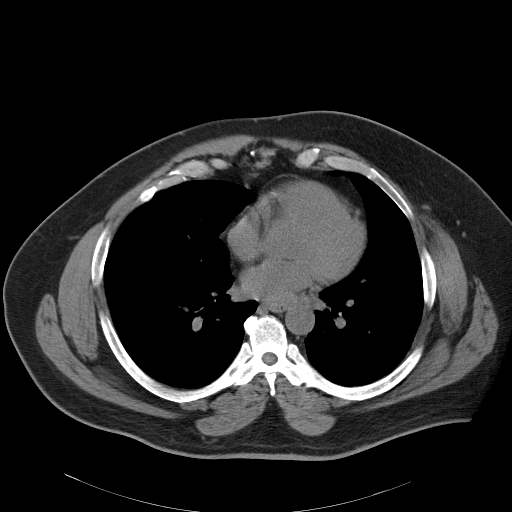

[Series 602: coronal · coronal · 0.99mm/px · 3 of 134 slices shown]
[im 45/134  soft-tissue]
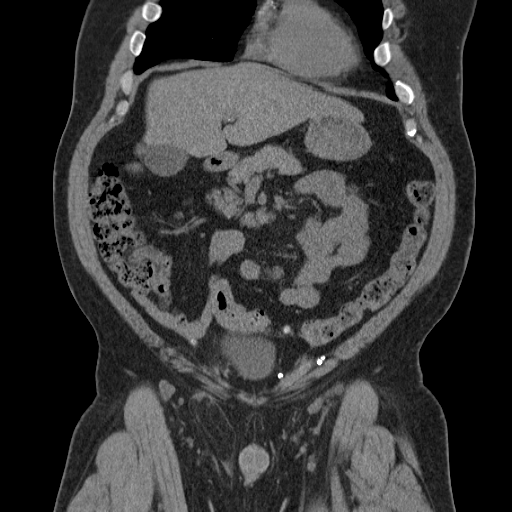
[im 60/134  soft-tissue]
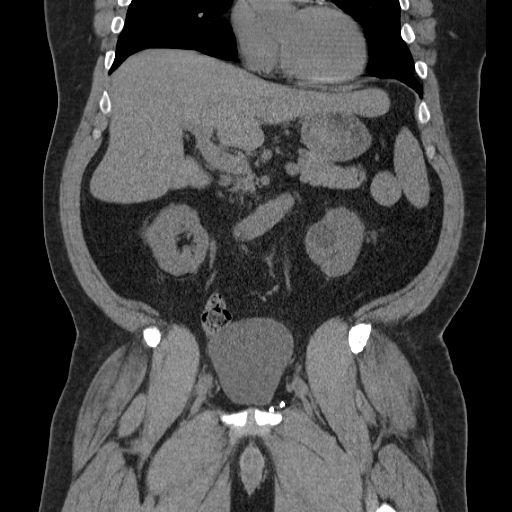
[im 74/134  soft-tissue]
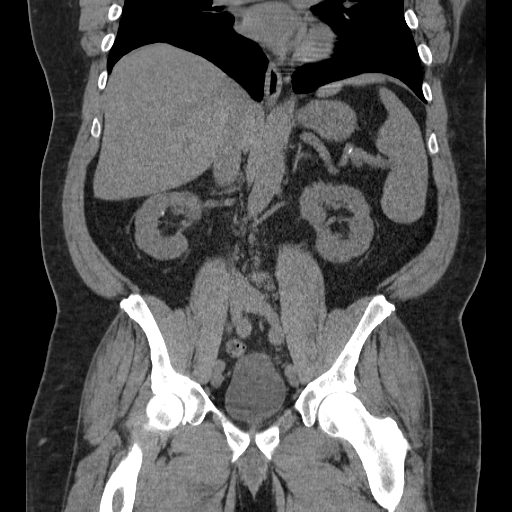

[16 of 46 positions shown; findings below may reference images not displayed]

FINDINGS: Lower chest: The lung bases are clear of acute process. No pleural
effusion or pulmonary lesions. The heart is normal in size. No
pericardial effusion. Age advanced three-vessel coronary artery
calcifications are noted. The distal esophagus and aorta are
unremarkable.

Hepatobiliary: No focal hepatic lesions or intrahepatic biliary
dilatation. The gallbladder is normal. No common bile duct
dilatation.

Pancreas: No mass, inflammation or ductal dilatation.

Spleen: Normal size.  No focal lesions.

Adrenals/Urinary Tract: The adrenal glands are normal.

Small right-sided renal calculi are noted. No obstructing ureteral
calculi or bladder calculi. The left kidney demonstrates a simple
appearing cyst. No renal calculi or hydronephrosis. No left-sided
ureteral calculi.

Stomach/Bowel: The stomach, duodenum, small bowel and colon are
grossly normal without oral contrast. No inflammatory changes, mass
lesions or obstructive findings. The terminal ileum and appendix are
normal.

Vascular/Lymphatic: Scattered atherosclerotic calcifications
involving the aorta but no aneurysm. No branch vessel
calcifications. No mesenteric or retroperitoneal mass or adenopathy.
Small scattered lymph nodes are noted.

Reproductive: The prostate gland and seminal vesicles are
unremarkable.

Other: Surgical changes from a left inguinal hernia repair. No
recurrent hernia. There is a right inguinal hernia containing fat.
This appears stable when compared to the prior CT scan. Small
bilateral inguinal lymph nodes but no mass or overt adenopathy.

Musculoskeletal: Advanced degenerative changes involving the lower
thoracic and lumbar spine with multilevel disc disease and facet
disease in the lumbar spine. No acute bony findings or destructive
bony changes.
IMPRESSION: 1. Right-sided renal calculi but no obstructing ureteral calculi or
bladder calculi.
2. No acute abdominal/pelvic findings, mass lesions or
lymphadenopathy.
3. Right inguinal hernia containing fat. Prior left inguinal hernia
repair without recurrent hernia.
4. Advanced degenerative disc disease and facet disease involving
the lumbar spine.

## 2018-12-15 ENCOUNTER — Other Ambulatory Visit: Payer: Self-pay | Admitting: Family Medicine

## 2018-12-15 DIAGNOSIS — I10 Essential (primary) hypertension: Secondary | ICD-10-CM

## 2018-12-16 ENCOUNTER — Other Ambulatory Visit: Payer: Self-pay | Admitting: Urology

## 2018-12-16 DIAGNOSIS — R351 Nocturia: Secondary | ICD-10-CM

## 2018-12-17 ENCOUNTER — Telehealth: Payer: Self-pay | Admitting: Urology

## 2018-12-17 NOTE — Telephone Encounter (Signed)
Justin Soto needs to have an office visit with a PSA prior in March 2020.  He is on active surveillance for prostate cancer.  This appointment needs to be made prior to refilling his tamsulosin.

## 2018-12-17 NOTE — Telephone Encounter (Signed)
Please schedule. Thanks! 

## 2018-12-25 DIAGNOSIS — I208 Other forms of angina pectoris: Secondary | ICD-10-CM | POA: Diagnosis not present

## 2018-12-25 NOTE — Telephone Encounter (Signed)
apps have been scheduled and patient is aware   Justin Soto

## 2018-12-26 DIAGNOSIS — G4733 Obstructive sleep apnea (adult) (pediatric): Secondary | ICD-10-CM | POA: Diagnosis not present

## 2019-01-04 DIAGNOSIS — E782 Mixed hyperlipidemia: Secondary | ICD-10-CM | POA: Diagnosis not present

## 2019-01-04 DIAGNOSIS — I10 Essential (primary) hypertension: Secondary | ICD-10-CM | POA: Diagnosis not present

## 2019-01-04 DIAGNOSIS — G4733 Obstructive sleep apnea (adult) (pediatric): Secondary | ICD-10-CM | POA: Diagnosis not present

## 2019-01-26 ENCOUNTER — Other Ambulatory Visit: Payer: Self-pay | Admitting: Urology

## 2019-01-30 ENCOUNTER — Other Ambulatory Visit: Payer: Self-pay | Admitting: Family Medicine

## 2019-01-30 DIAGNOSIS — I10 Essential (primary) hypertension: Secondary | ICD-10-CM

## 2019-02-09 ENCOUNTER — Other Ambulatory Visit: Payer: Self-pay | Admitting: Urology

## 2019-02-18 ENCOUNTER — Other Ambulatory Visit: Payer: Self-pay

## 2019-02-18 DIAGNOSIS — C61 Malignant neoplasm of prostate: Secondary | ICD-10-CM

## 2019-02-19 ENCOUNTER — Other Ambulatory Visit: Payer: BLUE CROSS/BLUE SHIELD

## 2019-02-21 ENCOUNTER — Other Ambulatory Visit: Payer: Self-pay | Admitting: Family Medicine

## 2019-02-22 ENCOUNTER — Ambulatory Visit: Payer: BLUE CROSS/BLUE SHIELD | Admitting: Urology

## 2019-02-25 ENCOUNTER — Other Ambulatory Visit: Payer: Self-pay

## 2019-02-25 ENCOUNTER — Other Ambulatory Visit: Payer: BLUE CROSS/BLUE SHIELD

## 2019-02-25 DIAGNOSIS — C61 Malignant neoplasm of prostate: Secondary | ICD-10-CM

## 2019-02-26 LAB — PSA: Prostate Specific Ag, Serum: 1.5 ng/mL (ref 0.0–4.0)

## 2019-03-06 ENCOUNTER — Ambulatory Visit: Payer: BLUE CROSS/BLUE SHIELD | Admitting: Urology

## 2019-03-22 ENCOUNTER — Other Ambulatory Visit: Payer: BLUE CROSS/BLUE SHIELD

## 2019-04-01 DIAGNOSIS — G4733 Obstructive sleep apnea (adult) (pediatric): Secondary | ICD-10-CM | POA: Diagnosis not present

## 2019-04-03 ENCOUNTER — Ambulatory Visit: Payer: BLUE CROSS/BLUE SHIELD | Admitting: Urology

## 2019-04-04 ENCOUNTER — Telehealth: Payer: Self-pay | Admitting: Family Medicine

## 2019-04-04 NOTE — Telephone Encounter (Signed)
Spoke to pt and advised to switch to zyrtec and use flonase and both had prescription already sent to pharmacy.  Pt advised that the zyrtec doesn't do great and I advised to check with his pharmacy on what he could use along with the zyrtec to help better.  dbs

## 2019-04-04 NOTE — Telephone Encounter (Signed)
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  He takes Claritin but says its not working.   [facility] [address].

## 2019-04-04 NOTE — Telephone Encounter (Signed)
Try flonase and change claritin to zyrtec.

## 2019-04-04 NOTE — Telephone Encounter (Signed)
Please review

## 2019-04-09 ENCOUNTER — Telehealth (INDEPENDENT_AMBULATORY_CARE_PROVIDER_SITE_OTHER): Payer: BLUE CROSS/BLUE SHIELD | Admitting: Urology

## 2019-04-09 ENCOUNTER — Telehealth: Payer: Self-pay | Admitting: Urology

## 2019-04-09 ENCOUNTER — Other Ambulatory Visit: Payer: Self-pay

## 2019-04-09 DIAGNOSIS — C61 Malignant neoplasm of prostate: Secondary | ICD-10-CM | POA: Diagnosis not present

## 2019-04-09 DIAGNOSIS — N475 Adhesions of prepuce and glans penis: Secondary | ICD-10-CM

## 2019-04-09 DIAGNOSIS — R350 Frequency of micturition: Secondary | ICD-10-CM

## 2019-04-09 MED ORDER — TAMSULOSIN HCL 0.4 MG PO CAPS: ORAL_CAPSULE | ORAL | 2 refills | Status: DC

## 2019-04-09 MED ORDER — FINASTERIDE 5 MG PO TABS: 5.0000 mg | ORAL_TABLET | Freq: Every day | ORAL | 3 refills | Status: DC

## 2019-04-09 NOTE — Telephone Encounter (Signed)
Justin Soto will need a MRI of his prostate in July and then go on to have a fusion biopsy in [place] as he is on AS for prostate cancer.  I put the orders in for the MRI.  He told me he is getting new insurance in May.

## 2019-04-10 NOTE — Progress Notes (Signed)
Virtual Visit via Telephone Note  I connected with Justin Soto on 04/09/2019 at 1047 by audio/visual and verified that I am speaking with the correct person using two identifiers.  They are located at home.  I am located at my home.    This visit type was conducted due to national recommendations for restrictions regarding the COVID-19 Pandemic (e.g. social distancing).  This format is felt to be most appropriate for this patient at this time.  All issues noted in this document were discussed and addressed.  No physical exam was performed.   I discussed the limitations, risks, security and privacy concerns of performing an evaluation and management service by telephone and the availability of in person appointments. I also discussed with the patient that there may be a patient responsible charge related to this service. The patient expressed understanding and agreed to proceed.   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  He was diagnosed with his cancer in 07/2018 after undergoing a biopsy for an abnormal prostate exam and a failure a the PSA to reduce by 50% while on finasteride.   His most recent PSA was 1.5 ng/mL on 02/25/2019.  He is still taking the finasteride.    With the penile adhesions, he states he feels the adhesions had returned.  He is managing this conservatively at this time.  He is not wanting to address this further at this time.    Observations/Objective: Justin Soto is dressed appropriately, well groomed and does not appear to be in distressed.  Assessment and Plan:  1. Prostate cancer - T1c low risk prostate cancer - will schedule MRI of prostate in July/August 2020 and then on to a fusion biopsy at [facility]  2. BPH with LU TS - refill given for finasteride  3. Penile adhesions - continue conservative  management   Follow Up Instructions:  Justin Soto will return for a MRI of prostate in July/August.     I discussed the assessment and treatment plan with the patient. The patient was provided an opportunity to ask questions and all were answered. The patient agreed with the plan and demonstrated an understanding of the instructions.   The patient was advised to call back or seek an in-person evaluation if the symptoms worsen or if the condition fails to improve as anticipated.  I provided 10 minutes of face-to-face time during this encounter.   Colden Samaras, PA-C

## 2019-04-12 ENCOUNTER — Ambulatory Visit: Payer: BLUE CROSS/BLUE SHIELD | Admitting: Urology

## 2019-04-15 ENCOUNTER — Ambulatory Visit: Payer: BLUE CROSS/BLUE SHIELD | Admitting: Urology

## 2019-04-16 NOTE — Telephone Encounter (Signed)
PA has been done and they will call him to schedule   Sharyn Lull

## 2019-05-18 ENCOUNTER — Other Ambulatory Visit: Payer: Self-pay | Admitting: Family Medicine

## 2019-05-27 ENCOUNTER — Telehealth: Payer: Self-pay

## 2019-05-27 NOTE — Telephone Encounter (Signed)
Patient is [facility]

## 2019-05-27 NOTE — Telephone Encounter (Signed)
Gabapentin is not on med list. Called patient to clarify. No answer. Will try again later.

## 2019-06-03 NOTE — Telephone Encounter (Signed)
Unable to contact the patient. Will save message to the chart.

## 2019-06-07 ENCOUNTER — Telehealth: Payer: Self-pay

## 2019-06-07 ENCOUNTER — Other Ambulatory Visit: Payer: BLUE CROSS/BLUE SHIELD

## 2019-06-07 DIAGNOSIS — R6889 Other general symptoms and signs: Secondary | ICD-10-CM | POA: Diagnosis not present

## 2019-06-07 DIAGNOSIS — Z20822 Contact with and (suspected) exposure to covid-19: Secondary | ICD-10-CM

## 2019-06-07 DIAGNOSIS — J988 Other specified respiratory disorders: Secondary | ICD-10-CM

## 2019-06-07 DIAGNOSIS — U071 Other specified respiratory disorders: Secondary | ICD-10-CM

## 2019-06-07 NOTE — Addendum Note (Signed)
Addended by: Denyce Robert on: 06/07/2019 10:09 AM   Modules accepted: Orders

## 2019-06-07 NOTE — Telephone Encounter (Signed)
Yes he needs covid testing first. I will send over.  p b

## 2019-06-07 NOTE — Telephone Encounter (Signed)
Scheduled patient for today at 10:45 am at [facility].  Testing protocol reviewed.

## 2019-06-07 NOTE — Telephone Encounter (Signed)
Patient called saying that he has had cough, low grade fever, sore throat, and feeling short of breath for the last 2 days. He initially felt this was allergy related, but it has gotten worse. He is a Arts administrator at [employer], and reports that his co-worker that he has had direct contact with has tested positive for covid19. He also mentioned that he checked his O2 at work and it was no higher than 93%.   Patient is wanting to come in to be seen, but I advised him that we may need to have testing done first due to exposure. Ok to refer to [facility]? Please advise. Thanks!

## 2019-06-13 LAB — NOVEL CORONAVIRUS, NAA: SARS-CoV-2, NAA: NOT DETECTED

## 2019-06-17 ENCOUNTER — Telehealth: Payer: Self-pay

## 2019-06-17 NOTE — Telephone Encounter (Signed)
Patient called saying that his cough is not better. He called the office about 2 weeks ago for the similar symptoms, and he was advised to get tested for covid. Patient's test results came back negative, but he was not treated for his symptoms. He has taken OTC allergy meds with no relief. He reports that he does have hx of allergies and frequent sinus infections. He denies fevers. His sore throat has improved, but is still there due to his cough. Patient requested to be examined for a possible CXR. Per provider, patient needed to be seen for evaluation. Appt scheduled with PA tomorrow at 1:20.

## 2019-06-18 ENCOUNTER — Ambulatory Visit: Payer: Self-pay | Admitting: Family Medicine

## 2019-06-20 ENCOUNTER — Ambulatory Visit: Payer: BC Managed Care – PPO | Admitting: Physician Assistant

## 2019-06-20 ENCOUNTER — Ambulatory Visit
Admission: RE | Admit: 2019-06-20 | Discharge: 2019-06-20 | Disposition: A | Discharge status: Home / Self Care | Payer: BC Managed Care – PPO | Source: Ambulatory Visit | Attending: Physician Assistant | Admitting: Physician Assistant

## 2019-06-20 ENCOUNTER — Encounter: Payer: Self-pay | Admitting: Physician Assistant

## 2019-06-20 ENCOUNTER — Other Ambulatory Visit: Payer: Self-pay

## 2019-06-20 VITALS — BP 131/81 | HR 90 | Temp 98.6°F | Resp 16 | Wt 299.0 lb

## 2019-06-20 DIAGNOSIS — R0602 Shortness of breath: Secondary | ICD-10-CM

## 2019-06-20 DIAGNOSIS — R072 Precordial pain: Secondary | ICD-10-CM | POA: Insufficient documentation

## 2019-06-20 DIAGNOSIS — M5412 Radiculopathy, cervical region: Secondary | ICD-10-CM

## 2019-06-20 DIAGNOSIS — R0609 Other forms of dyspnea: Secondary | ICD-10-CM | POA: Insufficient documentation

## 2019-06-20 DIAGNOSIS — R635 Abnormal weight gain: Secondary | ICD-10-CM

## 2019-06-20 DIAGNOSIS — R0600 Dyspnea, unspecified: Secondary | ICD-10-CM

## 2019-06-20 DIAGNOSIS — R0789 Other chest pain: Secondary | ICD-10-CM | POA: Diagnosis not present

## 2019-06-20 DIAGNOSIS — G4733 Obstructive sleep apnea (adult) (pediatric): Secondary | ICD-10-CM | POA: Diagnosis not present

## 2019-06-20 DIAGNOSIS — M542 Cervicalgia: Secondary | ICD-10-CM | POA: Diagnosis not present

## 2019-06-20 NOTE — Progress Notes (Addendum)
Patient: Justin Soto Male    DOB: 02/22/1961   58 y.o.   MRN: [id number] Visit Date: 06/20/2019  Today's Provider: Mar Daring, PA-C   Chief Complaint  Patient presents with   Cough   Subjective:     Cough Associated symptoms include headaches, myalgias and shortness of breath. Pertinent negatives include no chest pain, chills, ear congestion, ear pain, fever, heartburn, hemoptysis, nasal congestion, postnasal drip, rash, rhinorrhea, sore throat, sweats or wheezing. The symptoms are aggravated by exercise. Treatments tried: allergy medication. The treatment provided mild relief.     Cough: Patient called office 06/17/2019 saying that his cough is not better. He called the office about 2 weeks ago for the similar symptoms, and he was advised to get tested for covid. Patient's test results came back negative, but he was not treated for his symptoms. He has taken OTC allergy medications with no relief. He reports that he does have hx of allergies and frequent sinus infections. He denies fevers. His sore throat has improved, but is still there due to his cough. Patient requested to be examined for a possible CXR. Per provider, patient needed to be seen for evaluation.  Allergies  Allergen Reactions   Methocarbamol Other (See Comments)    Numbness, and change of taste    Vicodin [Hydrocodone-Acetaminophen] Itching    Has to use benedryl   Tizanidine Nausea Only     Current Outpatient Medications:    amLODipine-benazepril (LOTREL) 10-40 MG capsule, TAKE 1 CAPSULE BY MOUTH EVERY DAY, Disp: 90 capsule, Rfl: 3   aspirin EC 81 MG tablet, Take 81 mg by mouth daily., Disp: , Rfl:    cetirizine (ZYRTEC) 10 MG tablet, TAKE ONE TO TWO TABLETS BY MOUTH EVERY DAY AS NEEDED., Disp: 180 tablet, Rfl: 4   finasteride (PROSCAR) 5 MG tablet, Take 1 tablet (5 mg total) by mouth daily., Disp: 90 tablet, Rfl: 3   fluticasone (FLONASE) 50 MCG/ACT nasal spray, USE TWO SPRAYS IN EACH  NOSTRIL EVERY DAY, Disp: 48 g, Rfl: 4   fluticasone (FLOVENT HFA) 44 MCG/ACT inhaler, Inhale 2 puffs into the lungs 2 (two) times daily., Disp: 1 Inhaler, Rfl: 12   hydrochlorothiazide (HYDRODIURIL) 25 MG tablet, TAKE ONE TABLET BY MOUTH EVERY DAY, Disp: 90 tablet, Rfl: 3   meloxicam (MOBIC) 15 MG tablet, Take 1 tablet (15 mg total) by mouth daily., Disp: 30 tablet, Rfl: 0   metoprolol tartrate (LOPRESSOR) 25 MG tablet, TAKE 1 TABLET BY MOUTH EVERY DAY, Disp: 90 tablet, Rfl: 3   montelukast (SINGULAIR) 10 MG tablet, TAKE ONE TABLET BY MOUTH EVERY MORNING, Disp: 90 tablet, Rfl: 3   Multiple Vitamin (MULTIVITAMIN WITH MINERALS) TABS tablet, Take 1 tablet by mouth daily., Disp: , Rfl:    omeprazole (PRILOSEC) 10 MG capsule, Take 10 mg by mouth daily., Disp: , Rfl:    tamsulosin (FLOMAX) 0.4 MG CAPS capsule, TAKE TWO CAPSULES EACH DAY AS DIRECTED, Disp: 180 capsule, Rfl: 2   amoxicillin-clavulanate (AUGMENTIN) 875-125 MG tablet, Take 1 tablet by mouth 2 (two) times daily. (Patient not taking: Reported on 06/20/2019), Disp: 20 tablet, Rfl: 0   cyclobenzaprine (FLEXERIL) 5 MG tablet, Take 1 tablet (5 mg total) by mouth 3 (three) times daily as needed for muscle spasms. (Patient not taking: Reported on 06/20/2019), Disp: 30 tablet, Rfl: 1  Review of Systems  Constitutional: Negative for appetite change, chills and fever.  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 Negative for ear pain, postnasal drip, rhinorrhea, sore throat and trouble swallowing.   Respiratory: Positive for cough, chest tightness and shortness of breath. Negative for hemoptysis and wheezing.   Cardiovascular: Negative for chest pain and palpitations.  Gastrointestinal: Negative for abdominal pain, heartburn, nausea and vomiting.  Musculoskeletal: Positive for myalgias.  Skin: Negative for rash.  Neurological: Positive for  headaches. Negative for light-headedness.    Social History   Tobacco Use   Smoking status: Never Smoker   Smokeless tobacco: Never Used  Substance Use Topics   Alcohol use: No      Objective:   BP (!) 146/90 (BP Location: Right Arm, Patient Position: Sitting, Cuff Size: Large)    Pulse 89    Temp 98.6 F (37 C) (Oral)    Resp 16    Wt 299 lb (135.6 kg)    SpO2 96%    BMI 48.26 kg/m  Vitals:   06/20/19 0900  BP: (!) 146/90  Pulse: 89  Resp: 16  Temp: 98.6 F (37 C)  TempSrc: Oral  SpO2: 96%  Weight: 299 lb (135.6 kg)     Physical Exam Vitals signs reviewed.  Constitutional:      General: He is not in acute distress.    Appearance: Normal appearance. He is well-developed. He is obese. He is not ill-appearing or diaphoretic.  HENT:     Head: Normocephalic and atraumatic.     Right Ear: Hearing, tympanic membrane, ear canal and external ear normal. No middle ear effusion. Tympanic membrane is not erythematous or bulging.     Left Ear: Hearing, tympanic membrane, ear canal and external ear normal.  No middle ear effusion. Tympanic membrane is not erythematous or bulging.     Nose: No mucosal edema, congestion or rhinorrhea.     Right Sinus: No maxillary sinus tenderness or frontal sinus tenderness.     Left Sinus: No maxillary sinus tenderness or frontal sinus tenderness.     Mouth/Throat:     Lips: Pink.     Mouth: Mucous membranes are moist.     Pharynx: Uvula midline. No oropharyngeal exudate or posterior oropharyngeal erythema.  Eyes:     General:        Right eye: No discharge.        Left eye: No discharge.     Conjunctiva/sclera: Conjunctivae normal.     Pupils: Pupils are equal, round, and reactive to light.  Neck:     Musculoskeletal: Normal range of motion and neck supple.     Thyroid: No thyromegaly.     Trachea: No tracheal deviation.     Meningeal: Brudzinski's sign and Kernig's sign absent.  Cardiovascular:     Rate and Rhythm: Normal rate and  regular rhythm.     Heart sounds: Normal heart sounds. No murmur. No friction rub. No gallop.   Pulmonary:     Effort: Pulmonary effort is normal. No respiratory distress.     Breath sounds: Normal breath sounds. No stridor. No wheezing or rales.  Musculoskeletal:     Right lower leg: No edema.     Left lower leg: No edema.  Lymphadenopathy:     Cervical: No cervical adenopathy.  Skin:    General: Skin is warm and dry.  Neurological:     Mental Status: He is alert.      No results found for any visits on 06/20/19.     Assessment &  Plan    1. SOB (shortness of breath) No signs of URI/sinus infection on exam. Lungs sound clear. Will get CXR as below to r/o other sources. Will check labs as below. Concerning for cardiovascular source with weight gain and DOE. However, did have stress echo in January that was unremarkable. Patient luckily has a f/u with cardiology soon. He will advise of these symptoms and results. Call if worsening. If all normal, may consider pulmonology referral.  - DG Chest 2 View; Future - CBC w/Diff/Platelet - Basic Metabolic Panel (BMET) - B Nat Peptide  2. DOE (dyspnea on exertion) See above medical treatment plan. - DG Chest 2 View; Future - CBC w/Diff/Platelet - Basic Metabolic Panel (BMET) - B Nat Peptide  3. Weight gain Up 7 pounds since January per documented weights. See above medical treatment plan. - DG Chest 2 View; Future - CBC w/Diff/Platelet - Basic Metabolic Panel (BMET) - B Nat Peptide  4. Cervical radiculopathy New with neck pain. Occurred following an accident from backing his equipment into a ditch.  Radiating symptoms down right arm without weakness. Will get imaging as below. I will f/u pending results.  - DG Cervical Spine Complete; Future     Mar Daring, PA-C  [facility]

## 2019-06-21 ENCOUNTER — Telehealth: Payer: Self-pay | Admitting: Family Medicine

## 2019-06-21 ENCOUNTER — Telehealth: Payer: Self-pay | Admitting: *Deleted

## 2019-06-21 DIAGNOSIS — M5030 Other cervical disc degeneration, unspecified cervical region: Secondary | ICD-10-CM

## 2019-06-21 DIAGNOSIS — M5412 Radiculopathy, cervical region: Secondary | ICD-10-CM

## 2019-06-21 DIAGNOSIS — M4712 Other spondylosis with myelopathy, cervical region: Secondary | ICD-10-CM

## 2019-06-21 LAB — BASIC METABOLIC PANEL
BUN/Creatinine Ratio: 22 — ABNORMAL HIGH (ref 9–20)
BUN: 20 mg/dL (ref 6–24)
CO2: 22 mmol/L (ref 20–29)
Calcium: 9.7 mg/dL (ref 8.7–10.2)
Chloride: 99 mmol/L (ref 96–106)
Creatinine, Ser: 0.93 mg/dL (ref 0.76–1.27)
GFR calc Af Amer: 104 mL/min/{1.73_m2} (ref >59)
GFR calc non Af Amer: 90 mL/min/{1.73_m2} (ref >59)
Glucose: 126 mg/dL — ABNORMAL HIGH (ref 65–99)
Potassium: 4.3 mmol/L (ref 3.5–5.2)
Sodium: 135 mmol/L (ref 134–144)

## 2019-06-21 LAB — CBC WITH DIFFERENTIAL/PLATELET
Basophils Absolute: 0.1 10*3/uL (ref 0.0–0.2)
Basos: 1 %
EOS (ABSOLUTE): 0.2 10*3/uL (ref 0.0–0.4)
Eos: 5 %
Hematocrit: 34.5 % — ABNORMAL LOW (ref 37.5–51.0)
Hemoglobin: 10.9 g/dL — ABNORMAL LOW (ref 13.0–17.7)
Immature Grans (Abs): 0 10*3/uL (ref 0.0–0.1)
Immature Granulocytes: 0 %
Lymphocytes Absolute: 1.2 10*3/uL (ref 0.7–3.1)
Lymphs: 23 %
MCH: 19.9 pg — ABNORMAL LOW (ref 26.6–33.0)
MCHC: 31.6 g/dL (ref 31.5–35.7)
MCV: 63 fL — ABNORMAL LOW (ref 79–97)
Monocytes Absolute: 0.6 10*3/uL (ref 0.1–0.9)
Monocytes: 11 %
Neutrophils Absolute: 3.2 10*3/uL (ref 1.4–7.0)
Neutrophils: 60 %
Platelets: 222 10*3/uL (ref 150–450)
RBC: 5.48 x10E6/uL (ref 4.14–5.80)
RDW: 17.1 % — ABNORMAL HIGH (ref 11.6–15.4)
WBC: 5.3 10*3/uL (ref 3.4–10.8)

## 2019-06-21 LAB — BRAIN NATRIURETIC PEPTIDE: BNP: <2.5 pg/mL (ref 0.0–100.0)

## 2019-06-21 MED ORDER — GABAPENTIN 100 MG PO CAPS: 100.0000 mg | ORAL_CAPSULE | Freq: Three times a day (TID) | ORAL | 3 refills | Status: DC

## 2019-06-21 NOTE — Telephone Encounter (Signed)
Referral was changed.

## 2019-06-21 NOTE — Telephone Encounter (Signed)
Gabapentin sent in. Referral placed.

## 2019-06-21 NOTE — Telephone Encounter (Signed)
Patient was notified of results. Expressed understanding. Patient would like a referral back to surgeon Dr. Ronnald Ramp in [place] (he doesn't remember office name). Patient is also requesting an rx for gabapentin [facility] to help with pain.

## 2019-06-21 NOTE — Telephone Encounter (Signed)
Referral was put in for ortho but Dr that pt is requesting is a Publishing rights manager. Is this ok ?

## 2019-06-21 NOTE — Telephone Encounter (Signed)
-----   Message from Mar Daring, [place] sent at 06/20/2019  5:35 PM EDT ----- Cervical hardware appears normal however there is arthritic changes that appear to be compressing the neural foramen (where the spinal cord and nerve roots come through). This could be compressing a nerve root causing the symptoms of the right arm. Would you like referral back to your spinal surgeon?

## 2019-06-27 DIAGNOSIS — I2510 Atherosclerotic heart disease of native coronary artery without angina pectoris: Secondary | ICD-10-CM | POA: Diagnosis not present

## 2019-06-27 DIAGNOSIS — R918 Other nonspecific abnormal finding of lung field: Secondary | ICD-10-CM | POA: Diagnosis not present

## 2019-06-27 DIAGNOSIS — I517 Cardiomegaly: Secondary | ICD-10-CM | POA: Diagnosis not present

## 2019-06-27 DIAGNOSIS — R0789 Other chest pain: Secondary | ICD-10-CM | POA: Diagnosis not present

## 2019-07-02 DIAGNOSIS — G4733 Obstructive sleep apnea (adult) (pediatric): Secondary | ICD-10-CM | POA: Diagnosis not present

## 2019-07-06 ENCOUNTER — Ambulatory Visit
Admission: RE | Admit: 2019-07-06 | Discharge: 2019-07-06 | Disposition: A | Discharge status: Home / Self Care | Payer: BC Managed Care – PPO | Source: Ambulatory Visit | Attending: Urology | Admitting: Urology

## 2019-07-06 ENCOUNTER — Other Ambulatory Visit: Payer: Self-pay

## 2019-07-06 DIAGNOSIS — C61 Malignant neoplasm of prostate: Secondary | ICD-10-CM

## 2019-07-06 MED ORDER — GADOBUTROL 1 MMOL/ML IV SOLN
10.0000 mL | Freq: Once | INTRAVENOUS | Status: AC | PRN
  Administered 2019-07-06: 10 mL via INTRAVENOUS

## 2019-07-09 DIAGNOSIS — M5441 Lumbago with sciatica, right side: Secondary | ICD-10-CM | POA: Diagnosis not present

## 2019-07-09 DIAGNOSIS — G8929 Other chronic pain: Secondary | ICD-10-CM | POA: Diagnosis not present

## 2019-07-09 DIAGNOSIS — I10 Essential (primary) hypertension: Secondary | ICD-10-CM | POA: Diagnosis not present

## 2019-07-09 DIAGNOSIS — M5412 Radiculopathy, cervical region: Secondary | ICD-10-CM | POA: Diagnosis not present

## 2019-07-10 ENCOUNTER — Telehealth: Payer: Self-pay

## 2019-07-10 NOTE — Telephone Encounter (Signed)
Patient notified, clearance faxed to [facility], patient given verbal instructions and reminder sent through mychart  Please schedule for biopsy

## 2019-07-10 NOTE — Telephone Encounter (Signed)
-----   Message from Nori Riis, PA-C sent at 07/09/2019  8:02 AM EDT ----- I have spoken with Justin Soto regarding his prostate MRI results and we can proceed with the prostate biopsy at our office.  Please contact Dr.Kowalski's office for clearance to stop his aspirin prior to the biopsy.  He also take Mobic, so that will have to be discontinued prior to the biopsy as well.  He will need to have someone go over the instructions once it is scheduled.

## 2019-07-10 NOTE — Telephone Encounter (Signed)
apps made  Instructions discussed with patient and mailed to him   Sharyn Lull

## 2019-07-13 ENCOUNTER — Other Ambulatory Visit: Payer: Self-pay | Admitting: Physician Assistant

## 2019-07-13 DIAGNOSIS — M5412 Radiculopathy, cervical region: Secondary | ICD-10-CM

## 2019-07-18 ENCOUNTER — Other Ambulatory Visit: Payer: Self-pay | Admitting: Student

## 2019-07-18 DIAGNOSIS — R072 Precordial pain: Secondary | ICD-10-CM | POA: Diagnosis not present

## 2019-07-18 DIAGNOSIS — G8929 Other chronic pain: Secondary | ICD-10-CM

## 2019-07-18 DIAGNOSIS — G4733 Obstructive sleep apnea (adult) (pediatric): Secondary | ICD-10-CM | POA: Diagnosis not present

## 2019-07-18 DIAGNOSIS — E782 Mixed hyperlipidemia: Secondary | ICD-10-CM | POA: Diagnosis not present

## 2019-07-18 DIAGNOSIS — M5412 Radiculopathy, cervical region: Secondary | ICD-10-CM

## 2019-07-18 DIAGNOSIS — M5441 Lumbago with sciatica, right side: Secondary | ICD-10-CM

## 2019-07-22 NOTE — Telephone Encounter (Signed)
Received cardiac clearance from Dr. Osvaldo Angst informed-aware to stop ASA 7 days prior-verbalized understanding.

## 2019-08-06 ENCOUNTER — Ambulatory Visit
Admission: RE | Admit: 2019-08-06 | Discharge: 2019-08-06 | Disposition: A | Discharge status: Home / Self Care | Payer: BC Managed Care – PPO | Source: Ambulatory Visit | Attending: Student | Admitting: Student

## 2019-08-06 ENCOUNTER — Other Ambulatory Visit: Payer: Self-pay

## 2019-08-06 DIAGNOSIS — G4733 Obstructive sleep apnea (adult) (pediatric): Secondary | ICD-10-CM | POA: Diagnosis not present

## 2019-08-06 DIAGNOSIS — I10 Essential (primary) hypertension: Secondary | ICD-10-CM | POA: Diagnosis not present

## 2019-08-06 DIAGNOSIS — M5412 Radiculopathy, cervical region: Secondary | ICD-10-CM | POA: Diagnosis not present

## 2019-08-06 DIAGNOSIS — G8929 Other chronic pain: Secondary | ICD-10-CM

## 2019-08-06 DIAGNOSIS — M5116 Intervertebral disc disorders with radiculopathy, lumbar region: Secondary | ICD-10-CM | POA: Diagnosis not present

## 2019-08-06 DIAGNOSIS — M5441 Lumbago with sciatica, right side: Secondary | ICD-10-CM | POA: Diagnosis not present

## 2019-08-06 DIAGNOSIS — R072 Precordial pain: Secondary | ICD-10-CM | POA: Diagnosis not present

## 2019-08-06 DIAGNOSIS — M47812 Spondylosis without myelopathy or radiculopathy, cervical region: Secondary | ICD-10-CM | POA: Diagnosis not present

## 2019-08-06 DIAGNOSIS — M5021 Other cervical disc displacement,  high cervical region: Secondary | ICD-10-CM | POA: Diagnosis not present

## 2019-08-06 DIAGNOSIS — M4802 Spinal stenosis, cervical region: Secondary | ICD-10-CM | POA: Diagnosis not present

## 2019-08-06 DIAGNOSIS — M4807 Spinal stenosis, lumbosacral region: Secondary | ICD-10-CM | POA: Diagnosis not present

## 2019-08-06 DIAGNOSIS — E782 Mixed hyperlipidemia: Secondary | ICD-10-CM | POA: Diagnosis not present

## 2019-08-06 LAB — POCT I-STAT CREATININE: Creatinine, Ser: 1.1 mg/dL (ref 0.61–1.24)

## 2019-08-06 MED ORDER — GADOBUTROL 1 MMOL/ML IV SOLN
10.0000 mL | Freq: Once | INTRAVENOUS | Status: AC | PRN
  Administered 2019-08-06: 10 mL via INTRAVENOUS

## 2019-08-07 ENCOUNTER — Other Ambulatory Visit: Payer: BC Managed Care – PPO | Admitting: Urology

## 2019-08-08 DIAGNOSIS — M5441 Lumbago with sciatica, right side: Secondary | ICD-10-CM | POA: Diagnosis not present

## 2019-08-21 ENCOUNTER — Ambulatory Visit: Payer: BC Managed Care – PPO | Admitting: Urology

## 2019-09-04 ENCOUNTER — Other Ambulatory Visit: Payer: Self-pay | Admitting: Neurosurgery

## 2019-09-04 ENCOUNTER — Other Ambulatory Visit: Payer: BC Managed Care – PPO | Admitting: Urology

## 2019-09-11 ENCOUNTER — Other Ambulatory Visit: Payer: BC Managed Care – PPO | Admitting: Urology

## 2019-09-18 ENCOUNTER — Ambulatory Visit: Payer: BC Managed Care – PPO | Admitting: Urology

## 2019-09-25 ENCOUNTER — Ambulatory Visit: Payer: BC Managed Care – PPO | Admitting: Urology

## 2019-10-18 NOTE — Progress Notes (Signed)
[id number] [place], [place] - [address] [address] Phone: [phone] Fax: [phone]      Your procedure is scheduled on Wednesday 10/23/2019.  Report to [facility] Main Entrance "A" at [place].M., and check in at the Admitting office.  Call this number if you have problems the morning of surgery:  [phone]  Call [phone] if you have any questions prior to your surgery date Monday-Friday 8am-4pm    Remember:  Do not eat or drink after midnight the night before your surgery     Take these medicines the morning of surgery with A SIP OF WATER: Atorvastatin (Lipitor) Cetirizine (Zyrtec) Cyclobenzaprine (Flexeril) - if needed for muscle spasms Finasteride (Proscar) Fluticasone (Flonase) nasal spray Gabapentin (Neurontin) Metoprolol (Lopressor) Montelukast (singular) Omeprazole (Prilosec) Tamsulosin (Flomax)   7 days prior to surgery STOP taking any Meloxicam (Mobic), Aspirin (unless otherwise instructed by your surgeon), Aleve, Naproxen, Ibuprofen, Motrin, Advil, Goody's, BC's, all herbal medications, fish oil, and all vitamins.    The Morning of Surgery  Do not wear jewelry.  Do not wear lotions, powders, colognes, or deodorant  Men may shave face and neck.  Do not bring valuables to the hospital.  [facility] is not responsible for any belongings or valuables.  If you are a smoker, DO NOT Smoke 24 hours prior to surgery  If you wear a CPAP at night please bring your mask, tubing, and machine the morning of surgery   Remember that you must have someone to transport you home after your surgery, and remain with you for 24 hours if you are discharged the same day.   Contacts, glasses, hearing aids, dentures or bridgework may not be worn into surgery.    Leave your suitcase in the car.  After surgery it may be brought to your room.  For patients admitted to the hospital, discharge time will be determined by your treatment  team.  Patients discharged the day of surgery will not be allowed to drive home.    Special instructions:   [facility]- Preparing For Surgery  Before surgery, you can play an important role. Because skin is not sterile, your skin needs to be as free of germs as possible. You can reduce the number of germs on your skin by washing with CHG (chlorahexidine gluconate) Soap before surgery.  CHG is an antiseptic cleaner which kills germs and bonds with the skin to continue killing germs even after washing.    Oral Hygiene is also important to reduce your risk of infection.  Remember - BRUSH YOUR TEETH THE MORNING OF SURGERY WITH YOUR REGULAR TOOTHPASTE  Please do not use if you have an allergy to CHG or antibacterial soaps. If your skin becomes reddened/irritated stop using the CHG.  Do not shave (including legs and underarms) for at least 48 hours prior to first CHG shower. It is OK to shave your face.  Please follow these instructions carefully.   1. Shower the NIGHT BEFORE SURGERY and the MORNING OF SURGERY with CHG Soap.   2. If you chose to wash your hair, wash your hair first as usual with your normal shampoo.  3. After you shampoo, rinse your hair and body thoroughly to remove the shampoo.  4. Use CHG as you would any other liquid soap. You can apply CHG directly to the skin and wash gently with a scrungie or a clean washcloth.   5. Apply the CHG Soap to your body  ONLY FROM THE NECK DOWN.  Do not use on open wounds or open sores. Avoid contact with your eyes, ears, mouth and genitals (private parts). Wash Face and genitals (private parts)  with your normal soap.   6. Wash thoroughly, paying special attention to the area where your surgery will be performed.  7. Thoroughly rinse your body with warm water from the neck down.  8. DO NOT shower/wash with your normal soap after using and rinsing off the CHG Soap.  9. Pat yourself dry with a CLEAN TOWEL.  10. Wear CLEAN PAJAMAS to bed  the night before surgery, wear comfortable clothes the morning of surgery  11. Place CLEAN SHEETS on your bed the night of your first shower and DO NOT SLEEP WITH PETS.    Day of Surgery:  Please shower the morning of surgery with the CHG soap Do not apply any deodorants/lotions.  Please wear clean clothes to the hospital/surgery center.   Remember to brush your teeth WITH YOUR REGULAR TOOTHPASTE.   Please read over the following fact sheets that you were given.

## 2019-10-21 ENCOUNTER — Encounter (HOSPITAL_COMMUNITY)
Admission: RE | Admit: 2019-10-21 | Discharge: 2019-10-21 | Disposition: A | Discharge status: Home / Self Care | Payer: BC Managed Care – PPO | Source: Ambulatory Visit | Attending: Neurosurgery | Admitting: Neurosurgery

## 2019-10-21 ENCOUNTER — Encounter (HOSPITAL_COMMUNITY): Payer: Self-pay

## 2019-10-21 ENCOUNTER — Other Ambulatory Visit (HOSPITAL_COMMUNITY)
Admission: RE | Admit: 2019-10-21 | Discharge: 2019-10-21 | Disposition: A | Discharge status: Home / Self Care | Payer: BC Managed Care – PPO | Source: Ambulatory Visit | Attending: Neurosurgery | Admitting: Neurosurgery

## 2019-10-21 ENCOUNTER — Other Ambulatory Visit: Payer: Self-pay

## 2019-10-21 DIAGNOSIS — M5126 Other intervertebral disc displacement, lumbar region: Secondary | ICD-10-CM | POA: Insufficient documentation

## 2019-10-21 DIAGNOSIS — I2510 Atherosclerotic heart disease of native coronary artery without angina pectoris: Secondary | ICD-10-CM | POA: Diagnosis not present

## 2019-10-21 DIAGNOSIS — Z01812 Encounter for preprocedural laboratory examination: Secondary | ICD-10-CM | POA: Diagnosis not present

## 2019-10-21 HISTORY — DX: Unspecified osteoarthritis, unspecified site: M19.90

## 2019-10-21 HISTORY — DX: Dyspnea, unspecified: R06.00

## 2019-10-21 LAB — TYPE AND SCREEN
ABO/RH(D): A POS
Antibody Screen: NEGATIVE

## 2019-10-21 LAB — BASIC METABOLIC PANEL
Anion gap: 10 (ref 5–15)
BUN: 14 mg/dL (ref 6–20)
CO2: 26 mmol/L (ref 22–32)
Calcium: 9.9 mg/dL (ref 8.9–10.3)
Chloride: 100 mmol/L (ref 98–111)
Creatinine, Ser: 0.96 mg/dL (ref 0.61–1.24)
GFR calc Af Amer: >60 mL/min (ref >60)
GFR calc non Af Amer: >60 mL/min (ref >60)
Glucose, Bld: 172 mg/dL — ABNORMAL HIGH (ref 70–99)
Potassium: 4.1 mmol/L (ref 3.5–5.1)
Sodium: 136 mmol/L (ref 135–145)

## 2019-10-21 LAB — CBC
HCT: 35 % — ABNORMAL LOW (ref 39.0–52.0)
Hemoglobin: 10.7 g/dL — ABNORMAL LOW (ref 13.0–17.0)
MCH: 20.2 pg — ABNORMAL LOW (ref 26.0–34.0)
MCHC: 30.6 g/dL (ref 30.0–36.0)
MCV: 66.2 fL — ABNORMAL LOW (ref 80.0–100.0)
Platelets: 174 10*3/uL (ref 150–400)
RBC: 5.29 MIL/uL (ref 4.22–5.81)
RDW: 17.2 % — ABNORMAL HIGH (ref 11.5–15.5)
WBC: 5.2 10*3/uL (ref 4.0–10.5)
nRBC: 0 % (ref 0.0–0.2)

## 2019-10-21 LAB — SURGICAL PCR SCREEN
MRSA, PCR: POSITIVE — AB
Staphylococcus aureus: POSITIVE — AB

## 2019-10-21 LAB — SARS CORONAVIRUS 2 (TAT 6-24 HRS): SARS Coronavirus 2: NEGATIVE

## 2019-10-21 LAB — ABO/RH: ABO/RH(D): A POS

## 2019-10-21 NOTE — Progress Notes (Signed)
[facility] and left message on patients voice mail.  Pt positive for MRSA and Staff.

## 2019-10-21 NOTE — Progress Notes (Signed)
PCP: Dr. Miguel Aschoff, Brooke Bonito Cardiologist:  Dr. Serafina Royals  EKG:  06/20/19.  Requested tracing from Dr. Alveria Apley office CXR:  N/A ECHO:  12/26/18 Stress Test:  12/26/18 Cardiac Cath:  Denies  Sleep Apnea, uses CPAP.  Covid testing 10/21/19   Patient denies shortness of breath, fever, cough, and chest pain at PAT appointment.  Patient verbalized understanding of instructions provided today at the PAT appointment.  Patient asked to review instructions at home and day of surgery.

## 2019-10-22 MED ORDER — DEXTROSE 5 % IV SOLN
3.0000 g | INTRAVENOUS | Status: AC
  Administered 2019-10-23 (×2): 3 g via INTRAVENOUS
  Filled 2019-10-22: qty 3000
  Filled 2019-10-22: qty 3

## 2019-10-22 NOTE — Progress Notes (Signed)
Anesthesia Chart Review: Recent cardiac eval for chest discomfort. Pt last seen by Dr. Nehemiah Massed 07/18/19 and was at that time cleared for prostate surgery as low risk, "chest discomfort we have further evaluated his coronary disease is moderate atherosclerosis of coronary arteries by CT angiogram with no evidence of significant flow-limiting concerns with an FFR of 0.8. Therefore it is appears that the patient may benefit from more aggressive medication management and safe for further medication management at this time. Additionally with this medication management the patient will be at low risk for cardiovascular complication with prostate surgery.Marland KitchenMarland KitchenProceed to surgery and/or invasive procedure without restriction to pre or post operative and/or procedural care. The patient is at lowest risk possible for cardiovascular complications with surgical intervention and/or invasive procedure. Currently has no evidence active and/or significant angina and/or congestive heart failure. The patient may discontinue aspirin 4 days prior to procedure and restart at a safe period thereafter."   Proep labs reviewed, mild anemia with Hgb 10.7.  EKG 06/20/2019 (copy of patient's chart): Normal sinus rhythm.  Rate 88.  CT FFR 06/28/19 (care everywhere): Left Main Artery: Normal FFR of greater than 0.8 Left Anterior Descending System: Normal FFR throughout the vessel. FFR equals 0.84 at the distal most aspect. Left Circumflex System: Normal FFR throughout the vessel. FFR equals 0.9 at the distal aspect of the vessel Right Coronary Artery system: Normal FFR throughout the vessel. FFR equals 0.93 of the distal aspect.  Impression:  No CT FFR evidence for flow-limiting stenosis.   Stress echo 12/26/2018 (Care Everywhere): INTERPRETATION Normal Stress Echocardiogram. EF > 55%.  Echo 11/30/16 (care everywhere): INTERPRETATION NORMAL LEFT VENTRICULAR SYSTOLIC FUNCTION WITH MILD LVH NORMAL RIGHT VENTRICULAR SYSTOLIC  FUNCTION TRIVIAL MR AND TR NO VALVULAR STENOSIS EF 55-60%   [place]onia Musty [facility] Short Stay Center/Anesthesiology Phone [phone] 10/22/2019 9:16 AM

## 2019-10-22 NOTE — Anesthesia Preprocedure Evaluation (Addendum)
Anesthesia Evaluation  Patient identified by MRN, date of birth, ID band Patient awake    Reviewed: Allergy & Precautions, NPO status , Patient's Chart, lab work & pertinent test results, reviewed documented beta blocker date and time   Airway Mallampati: III  TM Distance: >3 FB Neck ROM: Limited    Dental no notable dental hx. (+) Teeth Intact, Dental Advisory Given   Pulmonary shortness of breath, sleep apnea and Continuous Positive Airway Pressure Ventilation ,    Pulmonary exam normal breath sounds clear to auscultation       Cardiovascular hypertension, Pt. on home beta blockers and Pt. on medications Normal cardiovascular exam Rhythm:Regular Rate:Normal  EKG 06/20/2019 Normal sinus rhythm.  Rate 88.  CT FFR 06/28/19 (care everywhere): Left Main Artery: Normal FFR of greater than 0.8 Left Anterior Descending System: Normal FFR throughout the vessel. FFR equals 0.84 at the distal most aspect. Left Circumflex System: Normal FFR throughout the vessel. FFR equals 0.9 at the distal aspect of the vessel Right Coronary Artery system: Normal FFR throughout the vessel. FFR equals 0.93 of the distal aspect. Impression:  No CT FFR evidence for flow-limiting stenosis.  Stress echo 12/26/2018 (Care Everywhere): INTERPRETATION Normal Stress Echocardiogram. EF > 55%.  Echo 11/30/16 (care everywhere): INTERPRETATION NORMAL LEFT VENTRICULAR SYSTOLIC FUNCTION WITH MILD LVH NORMAL RIGHT VENTRICULAR SYSTOLIC FUNCTION TRIVIAL MR AND TR NO VALVULAR STENOSIS EF 55-60%   Neuro/Psych PSYCHIATRIC DISORDERS Depression negative neurological ROS     GI/Hepatic Neg liver ROS, GERD  ,  Endo/Other  Morbid obesity  Renal/GU negative Renal ROS  negative genitourinary   Musculoskeletal  (+) Arthritis , S/p ACDF 2015   Abdominal   Peds  Hematology negative hematology ROS (+) anemia ,   Anesthesia Other Findings    Reproductive/Obstetrics                           Anesthesia Physical Anesthesia Plan  ASA: III  Anesthesia Plan: General   Post-op Pain Management:    Induction: Intravenous  PONV Risk Score and Plan: Midazolam, Dexamethasone and Ondansetron  Airway Management Planned: Oral ETT and Video Laryngoscope Planned  Additional Equipment:   Intra-op Plan:   Post-operative Plan: Extubation in OR  Informed Consent: I have reviewed the patients History and Physical, chart, labs and discussed the procedure including the risks, benefits and alternatives for the proposed anesthesia with the patient or authorized representative who has indicated his/her understanding and acceptance.     Dental advisory given  Plan Discussed with: CRNA  Anesthesia Plan Comments: ( )      Anesthesia Quick Evaluation

## 2019-10-23 ENCOUNTER — Inpatient Hospital Stay (HOSPITAL_COMMUNITY): Payer: BC Managed Care – PPO

## 2019-10-23 ENCOUNTER — Encounter (HOSPITAL_COMMUNITY): Payer: Self-pay

## 2019-10-23 ENCOUNTER — Inpatient Hospital Stay (HOSPITAL_COMMUNITY): Payer: BC Managed Care – PPO | Admitting: Anesthesiology

## 2019-10-23 ENCOUNTER — Inpatient Hospital Stay (HOSPITAL_COMMUNITY): Payer: BC Managed Care – PPO | Admitting: Physician Assistant

## 2019-10-23 ENCOUNTER — Encounter (HOSPITAL_COMMUNITY)
Admission: RE | Disposition: A | Discharge status: Home / Self Care | Payer: Self-pay | Source: Home / Self Care | Attending: Neurosurgery

## 2019-10-23 ENCOUNTER — Other Ambulatory Visit: Payer: Self-pay

## 2019-10-23 ENCOUNTER — Inpatient Hospital Stay (HOSPITAL_COMMUNITY)
Admission: RE | Admit: 2019-10-23 | Discharge: 2019-10-25 | DRG: 454 | Disposition: A | Discharge status: Home / Self Care | Payer: BC Managed Care – PPO | Attending: Neurosurgery | Admitting: Neurosurgery

## 2019-10-23 DIAGNOSIS — G4730 Sleep apnea, unspecified: Secondary | ICD-10-CM | POA: Diagnosis not present

## 2019-10-23 DIAGNOSIS — E1165 Type 2 diabetes mellitus with hyperglycemia: Secondary | ICD-10-CM | POA: Diagnosis present

## 2019-10-23 DIAGNOSIS — H9190 Unspecified hearing loss, unspecified ear: Secondary | ICD-10-CM | POA: Diagnosis not present

## 2019-10-23 DIAGNOSIS — Z20828 Contact with and (suspected) exposure to other viral communicable diseases: Secondary | ICD-10-CM | POA: Diagnosis not present

## 2019-10-23 DIAGNOSIS — I10 Essential (primary) hypertension: Secondary | ICD-10-CM | POA: Diagnosis not present

## 2019-10-23 DIAGNOSIS — Z888 Allergy status to other drugs, medicaments and biological substances status: Secondary | ICD-10-CM

## 2019-10-23 DIAGNOSIS — Z981 Arthrodesis status: Secondary | ICD-10-CM | POA: Diagnosis not present

## 2019-10-23 DIAGNOSIS — E871 Hypo-osmolality and hyponatremia: Secondary | ICD-10-CM | POA: Diagnosis not present

## 2019-10-23 DIAGNOSIS — Z7982 Long term (current) use of aspirin: Secondary | ICD-10-CM | POA: Diagnosis not present

## 2019-10-23 DIAGNOSIS — Z419 Encounter for procedure for purposes other than remedying health state, unspecified: Secondary | ICD-10-CM

## 2019-10-23 DIAGNOSIS — M4807 Spinal stenosis, lumbosacral region: Secondary | ICD-10-CM | POA: Diagnosis not present

## 2019-10-23 DIAGNOSIS — M4326 Fusion of spine, lumbar region: Secondary | ICD-10-CM | POA: Diagnosis not present

## 2019-10-23 DIAGNOSIS — M5117 Intervertebral disc disorders with radiculopathy, lumbosacral region: Secondary | ICD-10-CM | POA: Diagnosis not present

## 2019-10-23 DIAGNOSIS — M48062 Spinal stenosis, lumbar region with neurogenic claudication: Secondary | ICD-10-CM | POA: Diagnosis not present

## 2019-10-23 DIAGNOSIS — J302 Other seasonal allergic rhinitis: Secondary | ICD-10-CM | POA: Diagnosis not present

## 2019-10-23 DIAGNOSIS — Z974 Presence of external hearing-aid: Secondary | ICD-10-CM

## 2019-10-23 DIAGNOSIS — G4733 Obstructive sleep apnea (adult) (pediatric): Secondary | ICD-10-CM | POA: Diagnosis not present

## 2019-10-23 DIAGNOSIS — Z6841 Body Mass Index (BMI) 40.0 and over, adult: Secondary | ICD-10-CM

## 2019-10-23 DIAGNOSIS — Z8249 Family history of ischemic heart disease and other diseases of the circulatory system: Secondary | ICD-10-CM

## 2019-10-23 DIAGNOSIS — Z885 Allergy status to narcotic agent status: Secondary | ICD-10-CM

## 2019-10-23 DIAGNOSIS — Z79899 Other long term (current) drug therapy: Secondary | ICD-10-CM | POA: Diagnosis not present

## 2019-10-23 DIAGNOSIS — M5116 Intervertebral disc disorders with radiculopathy, lumbar region: Secondary | ICD-10-CM | POA: Diagnosis not present

## 2019-10-23 DIAGNOSIS — D62 Acute posthemorrhagic anemia: Secondary | ICD-10-CM | POA: Diagnosis not present

## 2019-10-23 DIAGNOSIS — M7918 Myalgia, other site: Secondary | ICD-10-CM

## 2019-10-23 DIAGNOSIS — K219 Gastro-esophageal reflux disease without esophagitis: Secondary | ICD-10-CM | POA: Diagnosis present

## 2019-10-23 DIAGNOSIS — Z7951 Long term (current) use of inhaled steroids: Secondary | ICD-10-CM | POA: Diagnosis not present

## 2019-10-23 DIAGNOSIS — M4712 Other spondylosis with myelopathy, cervical region: Secondary | ICD-10-CM

## 2019-10-23 SURGERY — POSTERIOR LUMBAR FUSION 2 LEVEL
Anesthesia: General | Site: Spine Lumbar

## 2019-10-23 MED ORDER — LACTATED RINGERS IV SOLN
INTRAVENOUS | Status: DC | PRN
  Administered 2019-10-23: 08:00:00 via INTRAVENOUS

## 2019-10-23 MED ORDER — FENTANYL CITRATE (PF) 250 MCG/5ML IJ SOLN
INTRAMUSCULAR | Status: AC
  Filled 2019-10-23: qty 5

## 2019-10-23 MED ORDER — PROPOFOL 10 MG/ML IV BOLUS
INTRAVENOUS | Status: DC | PRN
  Administered 2019-10-23: 200 mg via INTRAVENOUS

## 2019-10-23 MED ORDER — METOPROLOL TARTRATE 25 MG PO TABS
25.0000 mg | ORAL_TABLET | Freq: Every day | ORAL | Status: DC
  Administered 2019-10-24 – 2019-10-25 (×2): 25 mg via ORAL
  Filled 2019-10-23 (×2): qty 1

## 2019-10-23 MED ORDER — THROMBIN 5000 UNITS EX SOLR
CUTANEOUS | Status: AC
  Filled 2019-10-23: qty 5000

## 2019-10-23 MED ORDER — CHLORHEXIDINE GLUCONATE CLOTH 2 % EX PADS: 6.0000 | MEDICATED_PAD | Freq: Once | CUTANEOUS | Status: DC

## 2019-10-23 MED ORDER — TAMSULOSIN HCL 0.4 MG PO CAPS
0.8000 mg | ORAL_CAPSULE | Freq: Every day | ORAL | Status: DC
  Administered 2019-10-24 – 2019-10-25 (×2): 0.8 mg via ORAL
  Filled 2019-10-23 (×2): qty 2

## 2019-10-23 MED ORDER — HYDROMORPHONE HCL 1 MG/ML IJ SOLN
INTRAMUSCULAR | Status: AC
  Filled 2019-10-23: qty 1

## 2019-10-23 MED ORDER — LORATADINE 10 MG PO TABS
10.0000 mg | ORAL_TABLET | Freq: Every day | ORAL | Status: DC
  Administered 2019-10-24 – 2019-10-25 (×2): 10 mg via ORAL
  Filled 2019-10-23 (×2): qty 1

## 2019-10-23 MED ORDER — CEFAZOLIN SODIUM 1 G IJ SOLR
INTRAMUSCULAR | Status: AC
  Filled 2019-10-23: qty 30

## 2019-10-23 MED ORDER — LIDOCAINE 2% (20 MG/ML) 5 ML SYRINGE
INTRAMUSCULAR | Status: DC | PRN
  Administered 2019-10-23: 100 mg via INTRAVENOUS

## 2019-10-23 MED ORDER — GABAPENTIN 300 MG PO CAPS
600.0000 mg | ORAL_CAPSULE | Freq: Two times a day (BID) | ORAL | Status: DC
  Administered 2019-10-23 – 2019-10-25 (×4): 600 mg via ORAL
  Filled 2019-10-23 (×4): qty 2

## 2019-10-23 MED ORDER — THROMBIN 5000 UNITS EX SOLR
OROMUCOSAL | Status: DC | PRN
  Administered 2019-10-23 (×2): 5 mL via TOPICAL

## 2019-10-23 MED ORDER — DOCUSATE SODIUM 100 MG PO CAPS
100.0000 mg | ORAL_CAPSULE | Freq: Two times a day (BID) | ORAL | Status: DC
  Administered 2019-10-23 – 2019-10-25 (×4): 100 mg via ORAL
  Filled 2019-10-23 (×4): qty 1

## 2019-10-23 MED ORDER — OXYCODONE HCL 5 MG PO TABS
10.0000 mg | ORAL_TABLET | ORAL | Status: DC | PRN
  Administered 2019-10-23 – 2019-10-24 (×7): 10 mg via ORAL
  Filled 2019-10-23 (×7): qty 2

## 2019-10-23 MED ORDER — MENTHOL 3 MG MT LOZG: 1.0000 | LOZENGE | OROMUCOSAL | Status: DC | PRN

## 2019-10-23 MED ORDER — SODIUM CHLORIDE 0.9% FLUSH
3.0000 mL | Freq: Two times a day (BID) | INTRAVENOUS | Status: DC
  Administered 2019-10-23: 20:00:00 3 mL via INTRAVENOUS

## 2019-10-23 MED ORDER — ACETAMINOPHEN 650 MG RE SUPP: 650.0000 mg | RECTAL | Status: DC | PRN

## 2019-10-23 MED ORDER — MORPHINE SULFATE (PF) 4 MG/ML IV SOLN
4.0000 mg | INTRAVENOUS | Status: DC | PRN
  Administered 2019-10-23 – 2019-10-24 (×3): 4 mg via INTRAVENOUS
  Filled 2019-10-23 (×3): qty 1

## 2019-10-23 MED ORDER — SODIUM CHLORIDE 0.9 % IV SOLN
250.0000 mL | INTRAVENOUS | Status: DC
  Administered 2019-10-23: 18:00:00 250 mL via INTRAVENOUS

## 2019-10-23 MED ORDER — KETAMINE HCL 10 MG/ML IJ SOLN
INTRAMUSCULAR | Status: DC | PRN
  Administered 2019-10-23 (×4): 10 mg via INTRAVENOUS
  Administered 2019-10-23: 20 mg via INTRAVENOUS
  Administered 2019-10-23: 10 mg via INTRAVENOUS
  Administered 2019-10-23: 60 mg via INTRAVENOUS
  Administered 2019-10-23: 20 mg via INTRAVENOUS

## 2019-10-23 MED ORDER — BUPIVACAINE-EPINEPHRINE 0.5% -1:200000 IJ SOLN
INTRAMUSCULAR | Status: AC
  Filled 2019-10-23: qty 1

## 2019-10-23 MED ORDER — AMLODIPINE BESY-BENAZEPRIL HCL 10-40 MG PO CAPS: 1.0000 | ORAL_CAPSULE | Freq: Every day | ORAL | Status: DC

## 2019-10-23 MED ORDER — LACTATED RINGERS IV SOLN
INTRAVENOUS | Status: DC | PRN
  Administered 2019-10-23 (×2): via INTRAVENOUS

## 2019-10-23 MED ORDER — ONDANSETRON HCL 4 MG/2ML IJ SOLN: 4.0000 mg | Freq: Four times a day (QID) | INTRAMUSCULAR | Status: DC | PRN

## 2019-10-23 MED ORDER — SUGAMMADEX SODIUM 200 MG/2ML IV SOLN
INTRAVENOUS | Status: DC | PRN
  Administered 2019-10-23: 270 mg via INTRAVENOUS

## 2019-10-23 MED ORDER — SUCCINYLCHOLINE CHLORIDE 200 MG/10ML IV SOSY
PREFILLED_SYRINGE | INTRAVENOUS | Status: DC | PRN
  Administered 2019-10-23: 180 mg via INTRAVENOUS

## 2019-10-23 MED ORDER — PHENYLEPHRINE HCL (PRESSORS) 10 MG/ML IV SOLN
INTRAVENOUS | Status: AC
  Filled 2019-10-23: qty 1

## 2019-10-23 MED ORDER — VANCOMYCIN HCL 10 G IV SOLR
1500.0000 mg | Freq: Once | INTRAVENOUS | Status: AC
  Administered 2019-10-23: 1500 mg via INTRAVENOUS
  Filled 2019-10-23: qty 1500

## 2019-10-23 MED ORDER — ACETAMINOPHEN 500 MG PO TABS
1000.0000 mg | ORAL_TABLET | Freq: Once | ORAL | Status: AC
  Administered 2019-10-23: 07:00:00 1000 mg via ORAL
  Filled 2019-10-23: qty 2

## 2019-10-23 MED ORDER — CEFAZOLIN SODIUM-DEXTROSE 2-4 GM/100ML-% IV SOLN
2.0000 g | Freq: Three times a day (TID) | INTRAVENOUS | Status: AC
  Administered 2019-10-23 – 2019-10-24 (×2): 2 g via INTRAVENOUS
  Filled 2019-10-23 (×2): qty 100

## 2019-10-23 MED ORDER — PHENYLEPHRINE 40 MCG/ML (10ML) SYRINGE FOR IV PUSH (FOR BLOOD PRESSURE SUPPORT)
PREFILLED_SYRINGE | INTRAVENOUS | Status: DC | PRN
  Administered 2019-10-23: 80 ug via INTRAVENOUS
  Administered 2019-10-23: 120 ug via INTRAVENOUS
  Administered 2019-10-23: 200 ug via INTRAVENOUS
  Administered 2019-10-23: 120 ug via INTRAVENOUS
  Administered 2019-10-23: 200 ug via INTRAVENOUS

## 2019-10-23 MED ORDER — BACITRACIN ZINC 500 UNIT/GM EX OINT
TOPICAL_OINTMENT | CUTANEOUS | Status: AC
  Filled 2019-10-23: qty 28.35

## 2019-10-23 MED ORDER — MIDAZOLAM HCL 2 MG/2ML IJ SOLN
INTRAMUSCULAR | Status: DC | PRN
  Administered 2019-10-23: 2 mg via INTRAVENOUS

## 2019-10-23 MED ORDER — ESMOLOL HCL 100 MG/10ML IV SOLN
INTRAVENOUS | Status: AC
  Filled 2019-10-23: qty 10

## 2019-10-23 MED ORDER — PROPOFOL 10 MG/ML IV BOLUS
INTRAVENOUS | Status: AC
  Filled 2019-10-23: qty 20

## 2019-10-23 MED ORDER — KETAMINE HCL 50 MG/5ML IJ SOSY
PREFILLED_SYRINGE | INTRAMUSCULAR | Status: AC
  Filled 2019-10-23: qty 5

## 2019-10-23 MED ORDER — ROCURONIUM BROMIDE 10 MG/ML (PF) SYRINGE
PREFILLED_SYRINGE | INTRAVENOUS | Status: DC | PRN
  Administered 2019-10-23: 40 mg via INTRAVENOUS
  Administered 2019-10-23: 10 mg via INTRAVENOUS
  Administered 2019-10-23 (×2): 20 mg via INTRAVENOUS
  Administered 2019-10-23: 60 mg via INTRAVENOUS
  Administered 2019-10-23: 30 mg via INTRAVENOUS
  Administered 2019-10-23 (×2): 20 mg via INTRAVENOUS
  Administered 2019-10-23: 30 mg via INTRAVENOUS
  Administered 2019-10-23: 20 mg via INTRAVENOUS

## 2019-10-23 MED ORDER — BUPIVACAINE LIPOSOME 1.3 % IJ SUSP
INTRAMUSCULAR | Status: DC | PRN
  Administered 2019-10-23: 20 mL

## 2019-10-23 MED ORDER — MONTELUKAST SODIUM 10 MG PO TABS
10.0000 mg | ORAL_TABLET | Freq: Every day | ORAL | Status: DC
  Administered 2019-10-24 – 2019-10-25 (×2): 10 mg via ORAL
  Filled 2019-10-23 (×2): qty 1

## 2019-10-23 MED ORDER — FINASTERIDE 5 MG PO TABS
5.0000 mg | ORAL_TABLET | Freq: Every day | ORAL | Status: DC
  Administered 2019-10-24 – 2019-10-25 (×2): 5 mg via ORAL
  Filled 2019-10-23 (×2): qty 1

## 2019-10-23 MED ORDER — KETAMINE HCL 50 MG/5ML IJ SOSY
PREFILLED_SYRINGE | INTRAMUSCULAR | Status: AC
  Filled 2019-10-23: qty 10

## 2019-10-23 MED ORDER — FENTANYL CITRATE (PF) 100 MCG/2ML IJ SOLN
INTRAMUSCULAR | Status: AC
  Administered 2019-10-23: 50 ug via INTRAVENOUS
  Filled 2019-10-23: qty 2

## 2019-10-23 MED ORDER — MIDAZOLAM HCL 2 MG/2ML IJ SOLN
INTRAMUSCULAR | Status: AC
  Filled 2019-10-23: qty 2

## 2019-10-23 MED ORDER — BUPIVACAINE-EPINEPHRINE (PF) 0.5% -1:200000 IJ SOLN
INTRAMUSCULAR | Status: DC | PRN
  Administered 2019-10-23: 10 mL

## 2019-10-23 MED ORDER — FENTANYL CITRATE (PF) 100 MCG/2ML IJ SOLN
25.0000 ug | INTRAMUSCULAR | Status: DC | PRN
  Administered 2019-10-23 (×2): 50 ug via INTRAVENOUS

## 2019-10-23 MED ORDER — PANTOPRAZOLE SODIUM 40 MG PO TBEC
40.0000 mg | DELAYED_RELEASE_TABLET | Freq: Every day | ORAL | Status: DC
  Administered 2019-10-24 – 2019-10-25 (×2): 40 mg via ORAL
  Filled 2019-10-23 (×2): qty 1

## 2019-10-23 MED ORDER — BISACODYL 10 MG RE SUPP: 10.0000 mg | Freq: Every day | RECTAL | Status: DC | PRN

## 2019-10-23 MED ORDER — BENAZEPRIL HCL 40 MG PO TABS
40.0000 mg | ORAL_TABLET | Freq: Every day | ORAL | Status: DC
  Administered 2019-10-24 – 2019-10-25 (×2): 40 mg via ORAL
  Filled 2019-10-23 (×2): qty 1

## 2019-10-23 MED ORDER — CYCLOBENZAPRINE HCL 10 MG PO TABS
10.0000 mg | ORAL_TABLET | Freq: Three times a day (TID) | ORAL | Status: DC | PRN
  Administered 2019-10-23 – 2019-10-24 (×3): 10 mg via ORAL
  Filled 2019-10-23 (×2): qty 1

## 2019-10-23 MED ORDER — ALBUMIN HUMAN 5 % IV SOLN
INTRAVENOUS | Status: DC | PRN
  Administered 2019-10-23: 09:00:00 via INTRAVENOUS

## 2019-10-23 MED ORDER — BUPIVACAINE LIPOSOME 1.3 % IJ SUSP
20.0000 mL | Freq: Once | INTRAMUSCULAR | Status: DC
  Filled 2019-10-23: qty 20

## 2019-10-23 MED ORDER — ROCURONIUM BROMIDE 10 MG/ML (PF) SYRINGE
PREFILLED_SYRINGE | INTRAVENOUS | Status: AC
  Filled 2019-10-23: qty 20

## 2019-10-23 MED ORDER — FENTANYL CITRATE (PF) 250 MCG/5ML IJ SOLN
INTRAMUSCULAR | Status: DC | PRN
  Administered 2019-10-23: 150 ug via INTRAVENOUS
  Administered 2019-10-23: 100 ug via INTRAVENOUS
  Administered 2019-10-23: 50 ug via INTRAVENOUS
  Administered 2019-10-23: 100 ug via INTRAVENOUS
  Administered 2019-10-23 (×7): 50 ug via INTRAVENOUS

## 2019-10-23 MED ORDER — 0.9 % SODIUM CHLORIDE (POUR BTL) OPTIME
TOPICAL | Status: DC | PRN
  Administered 2019-10-23: 1000 mL

## 2019-10-23 MED ORDER — OXYCODONE HCL 5 MG PO TABS: 5.0000 mg | ORAL_TABLET | ORAL | Status: DC | PRN

## 2019-10-23 MED ORDER — ONDANSETRON HCL 4 MG/2ML IJ SOLN
INTRAMUSCULAR | Status: AC
  Filled 2019-10-23: qty 2

## 2019-10-23 MED ORDER — PROPOFOL 1000 MG/100ML IV EMUL
INTRAVENOUS | Status: AC
  Filled 2019-10-23: qty 100

## 2019-10-23 MED ORDER — AMLODIPINE BESYLATE 5 MG PO TABS
10.0000 mg | ORAL_TABLET | Freq: Every day | ORAL | Status: DC
  Administered 2019-10-24 – 2019-10-25 (×2): 10 mg via ORAL
  Filled 2019-10-23 (×2): qty 2

## 2019-10-23 MED ORDER — CYCLOBENZAPRINE HCL 10 MG PO TABS
ORAL_TABLET | ORAL | Status: AC
  Filled 2019-10-23: qty 1

## 2019-10-23 MED ORDER — ACETAMINOPHEN 500 MG PO TABS
1000.0000 mg | ORAL_TABLET | Freq: Four times a day (QID) | ORAL | Status: AC
  Administered 2019-10-23 – 2019-10-24 (×4): 1000 mg via ORAL
  Filled 2019-10-23 (×4): qty 2

## 2019-10-23 MED ORDER — PHENYLEPHRINE 40 MCG/ML (10ML) SYRINGE FOR IV PUSH (FOR BLOOD PRESSURE SUPPORT)
PREFILLED_SYRINGE | INTRAVENOUS | Status: AC
  Filled 2019-10-23: qty 10

## 2019-10-23 MED ORDER — SODIUM CHLORIDE 0.9 % IV SOLN
INTRAVENOUS | Status: DC | PRN
  Administered 2019-10-23: 14:00:00 via INTRAVENOUS

## 2019-10-23 MED ORDER — SUCCINYLCHOLINE CHLORIDE 200 MG/10ML IV SOSY
PREFILLED_SYRINGE | INTRAVENOUS | Status: AC
  Filled 2019-10-23: qty 10

## 2019-10-23 MED ORDER — BACITRACIN ZINC 500 UNIT/GM EX OINT
TOPICAL_OINTMENT | CUTANEOUS | Status: DC | PRN
  Administered 2019-10-23: 1 via TOPICAL

## 2019-10-23 MED ORDER — PHENOL 1.4 % MT LIQD: 1.0000 | OROMUCOSAL | Status: DC | PRN

## 2019-10-23 MED ORDER — HYDROCHLOROTHIAZIDE 25 MG PO TABS
25.0000 mg | ORAL_TABLET | Freq: Every day | ORAL | Status: DC
  Administered 2019-10-24 – 2019-10-25 (×2): 25 mg via ORAL
  Filled 2019-10-23 (×2): qty 1

## 2019-10-23 MED ORDER — FLUTICASONE PROPIONATE 50 MCG/ACT NA SUSP
2.0000 | Freq: Every day | NASAL | Status: DC
  Administered 2019-10-23 – 2019-10-25 (×3): 2 via NASAL
  Filled 2019-10-23 (×2): qty 16

## 2019-10-23 MED ORDER — SODIUM CHLORIDE 0.9 % IV SOLN
INTRAVENOUS | Status: DC | PRN
  Administered 2019-10-23: 500 mL

## 2019-10-23 MED ORDER — ATORVASTATIN CALCIUM 40 MG PO TABS
40.0000 mg | ORAL_TABLET | Freq: Every day | ORAL | Status: DC
  Administered 2019-10-24 – 2019-10-25 (×2): 40 mg via ORAL
  Filled 2019-10-23 (×2): qty 1

## 2019-10-23 MED ORDER — ACETAMINOPHEN 325 MG PO TABS: 650.0000 mg | ORAL_TABLET | ORAL | Status: DC | PRN

## 2019-10-23 MED ORDER — SODIUM CHLORIDE 0.9% FLUSH: 3.0000 mL | INTRAVENOUS | Status: DC | PRN

## 2019-10-23 MED ORDER — HYDROMORPHONE HCL 1 MG/ML IJ SOLN
0.2500 mg | INTRAMUSCULAR | Status: DC | PRN
  Administered 2019-10-23 (×2): 0.5 mg via INTRAVENOUS

## 2019-10-23 MED ORDER — PHENYLEPHRINE HCL-NACL 10-0.9 MG/250ML-% IV SOLN
INTRAVENOUS | Status: DC | PRN
  Administered 2019-10-23: 14:00:00 via INTRAVENOUS
  Administered 2019-10-23: 80 ug/min via INTRAVENOUS
  Administered 2019-10-23: 50 ug/min via INTRAVENOUS

## 2019-10-23 MED ORDER — ONDANSETRON HCL 4 MG PO TABS: 4.0000 mg | ORAL_TABLET | Freq: Four times a day (QID) | ORAL | Status: DC | PRN

## 2019-10-23 SURGICAL SUPPLY — 73 items
APL SKNCLS STERI-STRIP NONHPOA (GAUZE/BANDAGES/DRESSINGS) ×1
BAG DECANTER FOR FLEXI CONT (MISCELLANEOUS) ×2 IMPLANT
BASKET BONE COLLECTION (BASKET) ×1 IMPLANT
BENZOIN TINCTURE PRP APPL 2/3 (GAUZE/BANDAGES/DRESSINGS) ×2 IMPLANT
BLADE CLIPPER SURG (BLADE) ×1 IMPLANT
BUR MATCHSTICK NEURO 3.0 LAGG (BURR) ×2 IMPLANT
BUR PRECISION FLUTE 6.0 (BURR) ×3 IMPLANT
CANISTER SUCT 3000ML PPV (MISCELLANEOUS) ×2 IMPLANT
CAP LOCK DLX THRD (Cap) ×6 IMPLANT
CARTRIDGE OIL MAESTRO DRILL (MISCELLANEOUS) ×1 IMPLANT
CONN CROSS 6.35X38-50 (Connector) ×2 IMPLANT
CONNECTOR CROSS 6.35X38-50 (Connector) IMPLANT
CONT SPEC 4OZ CLIKSEAL STRL BL (MISCELLANEOUS) ×2 IMPLANT
COVER BACK TABLE 60X90IN (DRAPES) ×2 IMPLANT
COVER WAND RF STERILE (DRAPES) ×1 IMPLANT
DECANTER SPIKE VIAL GLASS SM (MISCELLANEOUS) ×2 IMPLANT
DEVICE RISE INTERBODY CREO (Neuro Prosthesis/Implant) IMPLANT
DIFFUSER DRILL AIR PNEUMATIC (MISCELLANEOUS) ×2 IMPLANT
DRAPE C-ARM 42X72 X-RAY (DRAPES) ×4 IMPLANT
DRAPE HALF SHEET 40X57 (DRAPES) ×2 IMPLANT
DRAPE LAPAROTOMY 100X72X124 (DRAPES) ×2 IMPLANT
DRAPE SURG 17X23 STRL (DRAPES) ×8 IMPLANT
DRSG OPSITE POSTOP 4X8 (GAUZE/BANDAGES/DRESSINGS) ×1 IMPLANT
ELECT BLADE 4.0 EZ CLEAN MEGAD (MISCELLANEOUS) ×2
ELECT REM PT RETURN 9FT ADLT (ELECTROSURGICAL) ×2
ELECTRODE BLDE 4.0 EZ CLN MEGD (MISCELLANEOUS) ×1 IMPLANT
ELECTRODE REM PT RTRN 9FT ADLT (ELECTROSURGICAL) ×1 IMPLANT
GAUZE 4X4 16PLY RFD (DISPOSABLE) ×1 IMPLANT
GAUZE SPONGE 4X4 12PLY STRL (GAUZE/BANDAGES/DRESSINGS) ×1 IMPLANT
GLOVE BIO SURGEON STRL SZ 6.5 (GLOVE) ×5 IMPLANT
GLOVE BIO SURGEON STRL SZ8 (GLOVE) ×4 IMPLANT
GLOVE BIO SURGEON STRL SZ8.5 (GLOVE) ×4 IMPLANT
GLOVE BIOGEL PI IND STRL 6.5 (GLOVE) IMPLANT
GLOVE BIOGEL PI IND STRL 7.5 (GLOVE) IMPLANT
GLOVE BIOGEL PI INDICATOR 6.5 (GLOVE) ×4
GLOVE BIOGEL PI INDICATOR 7.5 (GLOVE) ×1
GLOVE EXAM NITRILE XL STR (GLOVE) IMPLANT
GLOVE SURG SS PI 7.0 STRL IVOR (GLOVE) ×1 IMPLANT
GOWN STRL REUS W/ TWL LRG LVL3 (GOWN DISPOSABLE) IMPLANT
GOWN STRL REUS W/ TWL XL LVL3 (GOWN DISPOSABLE) ×2 IMPLANT
GOWN STRL REUS W/TWL 2XL LVL3 (GOWN DISPOSABLE) IMPLANT
GOWN STRL REUS W/TWL LRG LVL3 (GOWN DISPOSABLE) ×4
GOWN STRL REUS W/TWL XL LVL3 (GOWN DISPOSABLE) ×6
HEMOSTAT POWDER KIT SURGIFOAM (HEMOSTASIS) ×3 IMPLANT
KIT BASIN OR (CUSTOM PROCEDURE TRAY) ×2 IMPLANT
KIT TURNOVER KIT B (KITS) ×2 IMPLANT
MILL MEDIUM DISP (BLADE) ×1 IMPLANT
NDL HYPO 21X1.5 SAFETY (NEEDLE) IMPLANT
NEEDLE HYPO 21X1.5 SAFETY (NEEDLE) ×2 IMPLANT
NEEDLE HYPO 22GX1.5 SAFETY (NEEDLE) ×2 IMPLANT
NS IRRIG 1000ML POUR BTL (IV SOLUTION) ×2 IMPLANT
OIL CARTRIDGE MAESTRO DRILL (MISCELLANEOUS) ×2
PACK LAMINECTOMY NEURO (CUSTOM PROCEDURE TRAY) ×2 IMPLANT
PAD ARMBOARD 7.5X6 YLW CONV (MISCELLANEOUS) ×11 IMPLANT
PATTIES SURGICAL .5 X1 (DISPOSABLE) IMPLANT
PATTIES SURGICAL 1X1 (DISPOSABLE) ×2 IMPLANT
PUTTY DBM 10CC CALC GRAN (Putty) ×1 IMPLANT
RISE INTERBODY CREO (Neuro Prosthesis/Implant) ×8 IMPLANT
ROD CURVED TI 6.35X75 (Rod) ×2 IMPLANT
SCREW PA DLX CREO 7.5X50 (Screw) ×6 IMPLANT
SPACER ALTERA 10X31 8-12MM-8 (Spacer) ×1 IMPLANT
SPONGE LAP 4X18 RFD (DISPOSABLE) IMPLANT
SPONGE NEURO XRAY DETECT 1X3 (DISPOSABLE) ×1 IMPLANT
SPONGE SURGIFOAM ABS GEL 100 (HEMOSTASIS) IMPLANT
STRIP CLOSURE SKIN 1/2X4 (GAUZE/BANDAGES/DRESSINGS) ×2 IMPLANT
SUT VIC AB 1 CT1 18XBRD ANBCTR (SUTURE) ×2 IMPLANT
SUT VIC AB 1 CT1 8-18 (SUTURE) ×6
SUT VIC AB 2-0 CP2 18 (SUTURE) ×5 IMPLANT
SYR 20ML LL LF (SYRINGE) ×1 IMPLANT
TOWEL GREEN STERILE (TOWEL DISPOSABLE) ×2 IMPLANT
TOWEL GREEN STERILE FF (TOWEL DISPOSABLE) ×2 IMPLANT
TRAY FOLEY MTR SLVR 16FR STAT (SET/KITS/TRAYS/PACK) ×2 IMPLANT
WATER STERILE IRR 1000ML POUR (IV SOLUTION) ×2 IMPLANT

## 2019-10-23 NOTE — Op Note (Signed)
Brief history: The patient is a 58 year old obese white male who has had prior lumbar surgery by other physicians.  He has complained of back and bilateral leg pain consistent with neurogenic claudication.  He has failed medical management and was worked up with a lumbar MRI which demonstrated recurrent herniated disc and stenosis at L3-4, L4-5 and L5-S1.  I discussed the various treatment options with him.  He has decided proceed with surgery.  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 Etc. requiring a wide decompression of the nerve roots.);  Bilateral L5-S1 laminotomy/foraminotomy to decompress the bilateral S1 nerve roots; L3-4 and L4-5 posterior lumbar interbody fusion with local morselized autograft bone and Zimmer DBM; insertion of interbody prosthesis at L3-4 and L4-5 (globus peek expandable interbody prosthesis); posterior segmental instrumentation from L3 to L5 with globus titanium pedicle screws and rods; posterior lateral arthrodesis at L3-4 and L4-5 with local morselized autograft bone and Zimmer DBM.  Surgeon: Dr. Earle Gell  Asst.: Arnetha Massy nurse practitioner  Anesthesia: Gen. endotracheal  Estimated blood loss: 600 cc  Drains: None  Complications: None  Description of procedure: The patient was brought to the operating room by the anesthesia team. General endotracheal anesthesia was induced. The patient was turned to the prone position on the Wilson  frame. The patient's lumbosacral region was then prepared with Betadine scrub and Betadine solution. Sterile drapes were applied.  I then injected the area to be incised with Marcaine with epinephrine solution. I then used the scalpel to make a linear midline incision over the L3-4, L4-5 and L5-S1 interspace, incising through the old surgical scar. I then used electrocautery to perform a bilateral subperiosteal dissection exposing the spinous process and lamina of L3, L4, L5 and the upper sacrum. We then obtained intraoperative radiograph to confirm our location. We then inserted the Verstrac retractor to provide exposure.  I began the decompression by using the high speed drill to perform laminotomies at L5-S1 bilaterally, and redo bilateral L3-4 and L4-5 laminotomies. We then used the Kerrison punches to widen the laminotomy and removed the ligamentum flavum and scar tissue at L3-4, L4-5 and L5-S1 bilaterally. We used the Kerrison punches to remove the medial facets at L3-4, L4-5 and L5-S1 bilaterally. We performed wide foraminotomies about the bilateral L3, L4, L5 and S1 nerve roots completing the decompression.  We now turned our attention to the posterior lumbar interbody fusion. I used a scalpel to incise the intervertebral disc at L3-4 and L4-5 bilaterally. I then performed a partial intervertebral discectomy at L3-4 and L4-5 bilaterally using the pituitary forceps. We prepared the vertebral endplates at [id number] and [id number] bilaterally for the fusion by removing the soft tissues with the curettes. We then used the trial spacers to pick the appropriate sized interbody prosthesis. We prefilled his prosthesis with a combination of local morselized autograft bone that we obtained during the decompression as well as Zimmer DBM. We inserted the prefilled prosthesis into the interspace at L3-4 and L4-5 bilaterally, we then  expanded the prosthesis. There was a good snug fit of the  prosthesis in the interspace. We  then filled and the remainder of the intervertebral disc space with local morselized autograft bone and Zimmer DBM. This completed the posterior lumbar interbody arthrodesis.  During the decompression and insertion of the prosthesis the assistant protected the thecal sac and nerve roots with the D'Errico retractor.  We now turned attention to the instrumentation. Under fluoroscopic guidance we cannulated the bilateral L3, L4 and L5 pedicles with the bone probe. We then removed the bone probe. We then tapped the pedicle with a 6.5 millimeter tap. We then removed the tap. We probed inside the tapped pedicle with a ball probe to rule out cortical breaches. We then inserted a 7.5 x 50 millimeter pedicle screw into the L3, L4 and L5 pedicles bilaterally under fluoroscopic guidance. We then palpated along the medial aspect of the pedicles to rule out cortical breaches. There were none. The nerve roots were not injured. We then connected the unilateral pedicle screws with a lordotic rod. We compressed the construct and secured the rod in place with the caps.  We placed a cross connector between the rods.  We then tightened the caps appropriately. This completed the instrumentation from L3-L5 bilaterally.  We now turned our attention to the posterior lateral arthrodesis at L3-4 and L4-5. We used the high-speed drill to decorticate the remainder of the facets, pars, transverse process at L3-4 and L4-5. We then applied a combination of local morselized autograft bone and Zimmer DBM over these decorticated posterior lateral structures. This completed the posterior lateral arthrodesis.  We then obtained hemostasis using bipolar electrocautery. We irrigated the wound out with bacitracin solution. We inspected the thecal sac and nerve roots and noted they were well decompressed. We then removed the retractor.  We injected Exparel . We reapproximated patient's thoracolumbar fascia with interrupted #1 Vicryl suture. We  reapproximated patient's subcutaneous tissue with interrupted 2-0 Vicryl suture. The reapproximated patient's skin with Steri-Strips and benzoin. The wound was then coated with bacitracin ointment. A sterile dressing was applied. The drapes were removed. The patient was subsequently returned to the supine position where they were extubated by the anesthesia team. He was then transported to the post anesthesia care unit in stable condition. All sponge instrument and needle counts were reportedly correct at the end of this case.

## 2019-10-23 NOTE — H&P (Signed)
Subjective: Patient is a 58 year old obese white male who has complained of back and leg pain consistent with neurogenic claudication.  He has failed medical management.  He was worked up with a lumbar MRI which demonstrated suspicious severe stenosis at L3-4-5.  I discussed the various treatment options with neurosurgery.  He has decided to proceed with an L3-4 interbody fusion, instrumentation and fusion.  Past Medical History:  Diagnosis Date  . Anemia   . Arthritis   . Dyspnea   . Enlarged prostate    hx of  . Environmental and seasonal allergies   . Frequency of urination   . GERD (gastroesophageal reflux disease)   . History of kidney stones   . HOH (hard of hearing)    wears bilateral hearing aids  . Hypertension   . Irregular heart beat    Pt feels this every once in a while; PCP aware but stated it was OK  . Kidney stone on right side 01/15/2017  . Leg pain, right    Take Lyrica and Naproxen  . Neck pain of over 3 months duration   . Sleep apnea   . Thalassemia   . Tick fever     Past Surgical History:  Procedure Laterality Date  . ANTERIOR CERVICAL DECOMP/DISCECTOMY FUSION N/A 11/13/2014   Procedure: Cervical three-four, Cervical six-seven anterior cervical decompression with fusion interbody prosthesis plating and bonegraft;  Surgeon: Newman Pies, MD;  Location: [facility];  Service: Neurosurgery;  Laterality: N/A;  Cervical three-four, Cervical six-seven anterior cervical decompression with fusion interbody prosthesis plating and bonegraft  . BACK SURGERY     Lumbar X 2  . CALCANEAL OSTEOTOMY Right 01/11/2017   Procedure: CALCANEAL OSTEOTOMY;  Surgeon: Earnestine Leys, MD;  Location: [facility];  Service: Orthopedics;  Laterality: Right;  . CIRCUMCISION N/A 12/13/2017   Procedure: PENILE BLOCK, LYSIS OF PENILE ADHESIONS;  Surgeon: Hollice Espy, MD;  Location: [facility];  Service: Urology;  Laterality: N/A;  . COLONOSCOPY    . CYSTOSCOPY N/A 12/13/2017   Procedure: CYSTOSCOPY;   Surgeon: Hollice Espy, MD;  Location: [facility];  Service: Urology;  Laterality: N/A;  . HERNIA REPAIR Left    groin  . LEG SURGERY Right    Had surgery on right calf X 5  . LITHOTRIPSY    . QUADRICEPS TENDON REPAIR Left 06/22/2015   Procedure: REPAIR QUADRICEP TENDON;  Surgeon: Earnestine Leys, MD;  Location: [facility];  Service: Orthopedics;  Laterality: Left;  Marland Kitchen QUADRICEPS TENDON REPAIR Left 08/12/2015   Procedure: REPAIR QUADRICEP TENDON;  Surgeon: Earnestine Leys, MD;  Location: [facility];  Service: Orthopedics;  Laterality: Left;  . ROTATOR CUFF REPAIR Left   . TONSILLECTOMY    . VASECTOMY     with hydrocele repaired    Allergies  Allergen Reactions  . Methocarbamol Other (See Comments)    Numbness, and change of taste   . Vicodin [Hydrocodone-Acetaminophen] Itching    Has to use benadryl  . Tizanidine Nausea Only    Social History   Tobacco Use  . Smoking status: Never Smoker  . Smokeless tobacco: Never Used  Substance Use Topics  . Alcohol use: No    Family History  Problem Relation Age of Onset  . Prostate cancer Father   . Heart disease Father   . Diabetes Father   . Alzheimer's disease Father   . Anemia Mother   . Osteoporosis Mother   . Diabetes Brother   . Hypertension Brother   . Anemia Daughter   .  Kidney disease Neg Hx   . Bladder Cancer Neg Hx    Prior to Admission medications   Medication Sig Start Date End Date Taking? Authorizing Provider  amLODipine-benazepril (LOTREL) 10-40 MG capsule TAKE 1 CAPSULE BY MOUTH EVERY DAY Patient taking differently: Take 1 capsule by mouth daily.  01/30/19  Yes Jerrol Banana., MD  aspirin EC 81 MG tablet Take 81 mg by mouth daily.   Yes [provider]  atorvastatin (LIPITOR) 40 MG tablet Take 40 mg by mouth daily. 08/06/19  Yes [provider]  cetirizine (ZYRTEC) 10 MG tablet TAKE ONE TO TWO TABLETS BY MOUTH EVERY DAY AS NEEDED. Patient taking differently: Take 10 mg by mouth daily.  02/22/19   Yes Jerrol Banana., MD  cyclobenzaprine (FLEXERIL) 10 MG tablet Take 10 mg by mouth 2 (two) times daily as needed for muscle spasms. 10/03/19  Yes [provider]  finasteride (PROSCAR) 5 MG tablet Take 1 tablet (5 mg total) by mouth daily. 04/09/19  Yes McGowan, [name] A, PA-C  fluticasone (FLONASE) 50 MCG/ACT nasal spray USE TWO SPRAYS IN EACH NOSTRIL EVERY DAY Patient taking differently: Place 2 sprays into both nostrils daily.  09/20/18  Yes Jerrol Banana., MD  gabapentin (NEURONTIN) 300 MG capsule Take 600 mg by mouth 2 (two) times daily.   Yes [provider]  hydrochlorothiazide (HYDRODIURIL) 25 MG tablet TAKE ONE TABLET BY MOUTH EVERY DAY Patient taking differently: Take 25 mg by mouth daily.  12/17/18  Yes Jerrol Banana., MD  meloxicam (MOBIC) 15 MG tablet Take 1 tablet (15 mg total) by mouth daily. 07/24/17  Yes MacDiarmid, Nicki Reaper, MD  metoprolol tartrate (LOPRESSOR) 25 MG tablet TAKE 1 TABLET BY MOUTH EVERY DAY Patient taking differently: Take 25 mg by mouth daily.  05/18/19  Yes Jerrol Banana., MD  montelukast (SINGULAIR) 10 MG tablet TAKE ONE TABLET BY MOUTH EVERY MORNING Patient taking differently: Take 10 mg by mouth daily.  12/17/18  Yes Jerrol Banana., MD  Multiple Vitamin (MULTIVITAMIN WITH MINERALS) TABS tablet Take 1 tablet by mouth daily.   Yes [provider]  omeprazole (PRILOSEC) 10 MG capsule Take 10 mg by mouth daily.   Yes [provider]  tamsulosin (FLOMAX) 0.4 MG CAPS capsule TAKE TWO CAPSULES EACH DAY AS DIRECTED Patient taking differently: Take 0.8 mg by mouth daily.  04/09/19  Yes McGowan, [name] A, PA-C  cyclobenzaprine (FLEXERIL) 5 MG tablet Take 1 tablet (5 mg total) by mouth 3 (three) times daily as needed for muscle spasms. Patient not taking: Reported on 06/20/2019 09/20/18   Chrismon, Vickki Muff, PA  fluticasone (FLOVENT HFA) 44 MCG/ACT inhaler Inhale 2 puffs into the lungs 2 (two) times  daily. Patient not taking: Reported on 10/15/2019 11/05/18   Mar Daring, PA-C  gabapentin (NEURONTIN) 100 MG capsule TAKE 1 CAPSULE BY MOUTH THREE TIMES A DAY Patient not taking: Reported on 10/15/2019 07/14/19   Mar Daring, PA-C  gabapentin (NEURONTIN) 100 MG capsule Take 1 capsule (100 mg total) by mouth 3 (three) times daily. 06/21/19   Mar Daring, PA-C     Review of Systems  Positive ROS: As above  All other systems have been reviewed and were otherwise negative with the exception of those mentioned in the HPI and as above.  Objective: Vital signs in last 24 hours: Temp:  [98.5 F (36.9 C)] 98.5 F (36.9 C) (11/11 0657) Pulse Rate:  [102] 102 (11/11  [id number]) Resp:  [20] 20 (11/11 0657) BP: (144)/(76) 144/76 (11/11 0657) SpO2:  [96 %] 96 % (11/11 0657) Weight:  [134.9 kg] 134.9 kg (11/11 0700) Estimated body mass index is 48 kg/m as calculated from the following:   Height as of this encounter: 5\' 6"  (1.676 m).   Weight as of this encounter: 134.9 kg.   General Appearance: Alert obese Head: Normocephalic, without obvious abnormality, atraumatic Eyes: PERRL, conjunctiva/corneas clear, EOM's intact,    Ears: Normal  Throat: Normal  Neck: Supple, Back: unremarkable Lungs: Clear to auscultation bilaterally, respirations unlabored Heart: Regular rate and rhythm, no murmur, rub or gallop Abdomen: Soft, non-tender Extremities: Extremities normal, atraumatic, no cyanosis or edema Skin: unremarkable  NEUROLOGIC:   Mental status: alert and oriented,Motor Exam - grossly normal Sensory Exam - grossly normal Reflexes:  Coordination - grossly normal Gait - grossly normal Balance - grossly normal Cranial Nerves: I: smell Not tested  II: visual acuity  OS: Normal  OD: Normal   II: visual fields Full to confrontation  II: pupils Equal, round, reactive to light  III,VII: ptosis None  III,IV,VI: extraocular muscles  Full ROM  V: mastication Normal  V:  facial light touch sensation  Normal  V,VII: corneal reflex  Present  VII: facial muscle function - upper  Normal  VII: facial muscle function - lower Normal  VIII: hearing Not tested  IX: soft palate elevation  Normal  IX,X: gag reflex Present  XI: trapezius strength  5/5  XI: sternocleidomastoid strength 5/5  XI: neck flexion strength  5/5  XII: tongue strength  Normal    Data Review Lab Results  Component Value Date   WBC 5.2 10/21/2019   HGB 10.7 (L) 10/21/2019   HCT 35.0 (L) 10/21/2019   MCV 66.2 (L) 10/21/2019   PLT 174 10/21/2019   Lab Results  Component Value Date   NA 136 10/21/2019   K 4.1 10/21/2019   CL 100 10/21/2019   CO2 26 10/21/2019   BUN 14 10/21/2019   CREATININE 0.96 10/21/2019   GLUCOSE 172 (H) 10/21/2019   Lab Results  Component Value Date   INR 1.0 08/11/2014    Assessment/Plan: L3-4 and L4-5 herniated disc, spinal stenosis, lumbago, lumbar radiculopathy, neurogenic claudication: I have discussed the situation with the patient.  I reviewed his MRI with him and pointed out the discussed the various treatment options including surgery.  I discussed surgical treatment option of an L3-4 and L4-5 decompression, instrumentation and fusion.  I have shown him surgical models.  I have given him a surgical pamphlet.  We have discussed the risks, benefits, alternatives, expected postoperative course, and likelihood of achieving our goals with surgery.  I have answered all his questions.  He has decided to proceed with surgery.   Ophelia Charter 10/23/2019 8:25 AM

## 2019-10-23 NOTE — Transfer of Care (Signed)
Immediate Anesthesia Transfer of Care Note  Patient: Justin Soto  Procedure(s) Performed: POSTERIOR LUMBAR INTERBODY FUSION, INTERBODY PROSTHESIS, POSTERIOR INSTRUMENTATION LUMBAR THREE- LUMBAR FOUR, LUMBAR FOUR- LUMBAR FIVE (N/A Spine Lumbar)  Patient Location: PACU  Anesthesia Type:General  Level of Consciousness: sedated and patient cooperative  Airway & Oxygen Therapy: Patient Spontanous Breathing and Patient connected to face mask oxygen  Post-op Assessment: Report given to RN and Post -op Vital signs reviewed and stable  Post vital signs: Reviewed and stable  Last Vitals:  Vitals Value Taken Time  BP 112/65 10/23/19 1547  Temp    Pulse 106 10/23/19 1551  Resp 22 10/23/19 1551  SpO2 96 % 10/23/19 1551  Vitals shown include unvalidated device data.  Last Pain:  Vitals:   10/23/19 0657  TempSrc: Oral  PainSc: 8       Patients Stated Pain Goal: 2 ([id number])  Complications: No apparent anesthesia complications

## 2019-10-23 NOTE — Anesthesia Procedure Notes (Signed)
Procedure Name: Intubation Date/Time: 10/23/2019 8:39 AM Performed by: Janace Litten, CRNA Pre-anesthesia Checklist: Patient identified, Emergency Drugs available, Suction available and Patient being monitored Patient Re-evaluated:Patient Re-evaluated prior to induction Oxygen Delivery Method: Circle System Utilized Preoxygenation: Pre-oxygenation with 100% oxygen Induction Type: IV induction and Rapid sequence Laryngoscope Size: Glidescope and 4 Grade View: Grade I Tube type: Oral Tube size: 7.5 mm Number of attempts: 1 Airway Equipment and Method: Stylet Placement Confirmation: ETT inserted through vocal cords under direct vision,  positive ETCO2 and breath sounds checked- equal and bilateral Secured at: 22 cm Tube secured with: Tape Dental Injury: Teeth and Oropharynx as per pre-operative assessment  Comments: Elective glidescope due to LROM from previous cervical fusion and intermittent cervical neuropathies with extension

## 2019-10-24 LAB — HEMOGLOBIN A1C
Hgb A1c MFr Bld: 7 % — ABNORMAL HIGH (ref 4.8–5.6)
Mean Plasma Glucose: 154.2 mg/dL

## 2019-10-24 LAB — BASIC METABOLIC PANEL
Anion gap: 11 (ref 5–15)
BUN: 13 mg/dL (ref 6–20)
CO2: 24 mmol/L (ref 22–32)
Calcium: 8.2 mg/dL — ABNORMAL LOW (ref 8.9–10.3)
Chloride: 98 mmol/L (ref 98–111)
Creatinine, Ser: 1.16 mg/dL (ref 0.61–1.24)
GFR calc Af Amer: >60 mL/min (ref >60)
GFR calc non Af Amer: >60 mL/min (ref >60)
Glucose, Bld: 176 mg/dL — ABNORMAL HIGH (ref 70–99)
Potassium: 3.8 mmol/L (ref 3.5–5.1)
Sodium: 133 mmol/L — ABNORMAL LOW (ref 135–145)

## 2019-10-24 LAB — CBC
HCT: 27.2 % — ABNORMAL LOW (ref 39.0–52.0)
Hemoglobin: 8.4 g/dL — ABNORMAL LOW (ref 13.0–17.0)
MCH: 20.2 pg — ABNORMAL LOW (ref 26.0–34.0)
MCHC: 30.9 g/dL (ref 30.0–36.0)
MCV: 65.4 fL — ABNORMAL LOW (ref 80.0–100.0)
Platelets: 132 10*3/uL — ABNORMAL LOW (ref 150–400)
RBC: 4.16 MIL/uL — ABNORMAL LOW (ref 4.22–5.81)
RDW: 16.4 % — ABNORMAL HIGH (ref 11.5–15.5)
WBC: 6.3 10*3/uL (ref 4.0–10.5)
nRBC: 0 % (ref 0.0–0.2)

## 2019-10-24 MED ORDER — OXYCODONE-ACETAMINOPHEN 5-325 MG PO TABS
1.0000 | ORAL_TABLET | ORAL | Status: DC | PRN
  Administered 2019-10-24 – 2019-10-25 (×6): 2 via ORAL
  Filled 2019-10-24 (×6): qty 2

## 2019-10-24 MED ORDER — DEXAMETHASONE 4 MG PO TABS
4.0000 mg | ORAL_TABLET | Freq: Four times a day (QID) | ORAL | Status: AC
  Administered 2019-10-24 (×2): 4 mg via ORAL
  Filled 2019-10-24 (×2): qty 1

## 2019-10-24 MED ORDER — BISACODYL 5 MG PO TBEC
10.0000 mg | DELAYED_RELEASE_TABLET | Freq: Every day | ORAL | Status: DC | PRN
  Administered 2019-10-24: 10 mg via ORAL
  Filled 2019-10-24: qty 2

## 2019-10-24 MED ORDER — FERROUS SULFATE 325 (65 FE) MG PO TABS
325.0000 mg | ORAL_TABLET | Freq: Two times a day (BID) | ORAL | Status: DC
  Administered 2019-10-24 – 2019-10-25 (×2): 325 mg via ORAL
  Filled 2019-10-24 (×2): qty 1

## 2019-10-24 MED ORDER — HYDROXYZINE HCL 25 MG PO TABS
50.0000 mg | ORAL_TABLET | ORAL | Status: DC | PRN
  Administered 2019-10-24 – 2019-10-25 (×3): 50 mg via ORAL
  Filled 2019-10-24 (×3): qty 2

## 2019-10-24 MED ORDER — ALUM & MAG HYDROXIDE-SIMETH 200-200-20 MG/5ML PO SUSP
30.0000 mL | Freq: Four times a day (QID) | ORAL | Status: DC | PRN
  Administered 2019-10-24: 09:00:00 30 mL via ORAL
  Filled 2019-10-24: qty 30

## 2019-10-24 MED ORDER — DEXAMETHASONE SODIUM PHOSPHATE 4 MG/ML IJ SOLN: 4.0000 mg | Freq: Four times a day (QID) | INTRAMUSCULAR | Status: DC

## 2019-10-24 NOTE — Evaluation (Signed)
Physical Therapy Evaluation Patient Details Name: Justin Soto MRN: [id number] DOB: 1960-12-24 Today's Date: 10/24/2019   History of Present Illness  Pt is a 58 y/o male who presents s/p L3-L5 PLIF on 10/23/2019. 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  Clinical Impression  Pt admitted with above diagnosis. At the time of PT eval, pt was able to demonstrate transfers and ambulation with gross min guard assist to supervision for safety. 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 Would still like to practice stairs with pt as able next session. Pt was educated on precautions, brace application/wearing schedule, appropriate activity progression, and car transfer. Pt currently with functional limitations due to the deficits listed below (see PT Problem List). Pt will benefit from skilled PT to increase their independence and safety with mobility to allow discharge to the venue listed below.     Follow Up Recommendations No PT follow up;Supervision for mobility/OOB    Equipment Recommendations  None recommended by PT    Recommendations for Other Services       Precautions / Restrictions Precautions Precautions: Fall;Back Precaution Booklet Issued: Yes (comment) Precaution Comments: Reviewed handout with pt and he was cued for maintenance of precautions during functional mobility.  Required Braces or Orthoses: Spinal Brace Spinal Brace: Lumbar corset;Applied in sitting position Restrictions Weight Bearing Restrictions: No      Mobility  Bed Mobility Overal bed mobility: Needs Assistance Bed Mobility: Rolling;Sidelying to Sit Rolling: Modified independent (Device/Increase time) Sidelying to sit: Supervision       General bed mobility comments: VC's for proper log roll technique. Heavy  use of rails for support but overall no assistance required.   Transfers Overall transfer level: Needs assistance Equipment used: Rolling walker (2 wheeled) Transfers: Sit to/from Stand Sit to Stand: Min guard         General transfer comment: Close guard for safety as pt powered up to full stand. No assist required but increased time taken due to pain.   Ambulation/Gait Ambulation/Gait assistance: Min guard Gait Distance (Feet): 200 Feet Assistive device: Rolling walker (2 wheeled) Gait Pattern/deviations: Step-through pattern;Decreased stride length;Trunk flexed Gait velocity: Decreased Gait velocity interpretation: <1.31 ft/sec, indicative of household ambulator General Gait Details: VC's for improved posture, forward gaze, and closer walker proximity.   Stairs            Wheelchair Mobility    Modified Rankin (Stroke Patients Only)       Balance Overall balance assessment: Needs assistance Sitting-balance support: No upper extremity supported Sitting balance-Leahy Scale: Fair     Standing balance support: Single extremity supported;During functional activity Standing balance-Leahy Scale: Poor Standing balance comment: Reliant on at least 1 UE support while using urinal over toilet.                              Pertinent Vitals/Pain Pain Assessment: 0-10 Pain Score: 7  Pain Location: back Pain Descriptors / Indicators: Operative site guarding;Aching;Grimacing Pain Intervention(s): Limited activity within patient's tolerance;Monitored during session;Repositioned    Home Living Family/patient expects to be discharged to:: Private residence Living Arrangements: Spouse/significant other Available Help at Discharge: Family;Available 24 hours/day Type of Home: House Home Access: Stairs to enter Entrance Stairs-Rails: None Entrance [employer]  of Steps: 3-5 Home Layout: Two level;Able to live on main level with bedroom/bathroom Home Equipment:  Gilford Rile - 2 wheels;Bedside commode;Shower seat      Prior Function Level of Independence: Independent               Hand Dominance        Extremity/Trunk Assessment   Upper Extremity Assessment Upper Extremity Assessment: Defer to OT evaluation    Lower Extremity Assessment Lower Extremity Assessment: RLE deficits/detail;LLE deficits/detail RLE Deficits / Details: Foot drop from prior injury; generalized weakness LLE Deficits / Details: Decreased strength and muscular endurance consistent with pre-op diagnosis       Communication      Cognition Arousal/Alertness: Awake/alert Behavior During Therapy: WFL for tasks assessed/performed Overall Cognitive Status: Within Functional Limits for tasks assessed                                        General Comments      Exercises     Assessment/Plan    PT Assessment Patient needs continued PT services  PT Problem List Decreased strength;Decreased activity tolerance;Decreased balance;Decreased mobility;Decreased knowledge of use of DME;Decreased safety awareness;Decreased knowledge of precautions;Pain       PT Treatment Interventions DME instruction;Gait training;Stair training;Functional mobility training;Therapeutic activities;Therapeutic exercise;Neuromuscular re-education;Patient/family education    PT Goals (Current goals can be found in the Care Plan section)  Acute Rehab PT Goals Patient Stated Goal: Home tomorrow; get pain under control PT Goal Formulation: With patient Time For Goal Achievement: 10/31/19 Potential to Achieve Goals: Good    Frequency Min 5X/week   Barriers to discharge        Co-evaluation               AM-PAC PT "6 Clicks" Mobility  Outcome Measure Help needed turning from your back to your side while in a flat bed without using bedrails?: None Help needed moving from lying on your back to sitting on the side of a flat bed without using bedrails?: None Help  needed moving to and from a bed to a chair (including a wheelchair)?: A Little Help needed standing up from a chair using your arms (e.g., wheelchair or bedside chair)?: A Little Help needed to walk in hospital room?: A Little Help needed climbing 3-5 steps with a railing? : A Little 6 Click Score: 20    End of Session Equipment Utilized During Treatment: Gait belt;Back brace Activity Tolerance: Patient tolerated treatment well(Considering pain) Patient left: with call bell/phone within reach;Other (comment)(Sitting EOB attempting to urinate again) Nurse Communication: Mobility status;Other (comment)(Feels nauseated, unable to empty bladder) PT Visit Diagnosis: Unsteadiness on feet (R26.81);Other symptoms and signs involving the nervous system (R29.898);Pain Pain - part of body: (back)    Time: [id number] PT Time Calculation (min) (ACUTE ONLY): 36 min   Charges:   PT Evaluation $PT Eval Moderate Complexity: 1 Mod PT Treatments $Gait Training: 8-22 mins        Rolinda Roan, PT, DPT Acute Rehabilitation Services Pager: [phone] Office: [phone]   Thelma Comp 10/24/2019, 10:18 AM

## 2019-10-24 NOTE — Anesthesia Postprocedure Evaluation (Signed)
Anesthesia Post Note  Patient: Justin Soto  Procedure(s) Performed: POSTERIOR LUMBAR INTERBODY FUSION, INTERBODY PROSTHESIS, POSTERIOR INSTRUMENTATION LUMBAR THREE- LUMBAR FOUR, LUMBAR FOUR- LUMBAR FIVE (N/A Spine Lumbar)     Patient location during evaluation: PACU Anesthesia Type: General Level of consciousness: awake and alert Pain management: pain level controlled Vital Signs Assessment: post-procedure vital signs reviewed and stable Respiratory status: spontaneous breathing, nonlabored ventilation, respiratory function stable and patient connected to nasal cannula oxygen Cardiovascular status: blood pressure returned to baseline and stable Postop Assessment: no apparent nausea or vomiting Anesthetic complications: no    Last Vitals:  Vitals:   10/24/19 0547 10/24/19 0724  BP:  (!) 164/82  Pulse: (!) 112 (!) 113  Resp:  18  Temp: (!) 38.1 C 37.4 C  SpO2: 93% 93%    Last Pain:  Vitals:   10/24/19 0746  TempSrc:   PainSc: 5                  [name] Alexius Ellington

## 2019-10-24 NOTE — Evaluation (Signed)
Occupational Therapy Evaluation Patient Details Name: Justin Soto MRN: [id number] DOB: [birth year] Today's Date: 10/24/2019    History of Present Illness Pt is a 58 y/o male who presents s/p L3-L5 PLIF on 10/23/2019. 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   Clinical Impression   Pt was independent prior to admission. Presents with back pain, nausea, LE weakness and impaired standing balance. Pt educate at length in back precautions related to ADL, use of AE and compensatory strategies for ADL. Pt verbalized understanding of all information. No further OT needs.    Follow Up Recommendations  No OT follow up    Equipment Recommendations  None recommended by OT    Recommendations for Other Services       Precautions / Restrictions Precautions Precautions: Fall;Back Precaution Booklet Issued: Yes (comment) Precaution Comments: Reviewed handout with pt and he was cued for maintenance of precautions during functional mobility and ADL.  Required Braces or Orthoses: Spinal Brace Spinal Brace: Lumbar corset;Applied in sitting position Restrictions Weight Bearing Restrictions: No      Mobility Bed Mobility Overal bed mobility: Needs Assistance Bed Mobility: Sit to Sidelying;Rolling Rolling: Modified independent (Device/Increase time)      Sit to sidelying: Min guard General bed mobility comments: cues for sit to side lying  Transfers Overall transfer level: Needs assistance Equipment used: Rolling walker (2 wheeled) Transfers: Sit to/from Stand Sit to Stand: Min guard         General transfer comment: increased time and effort, min guard for safety    Balance Overall balance assessment: Needs assistance Sitting-balance support: No upper extremity supported Sitting balance-Leahy Scale: Fair     Standing balance support: Single extremity supported;During functional  activity Standing balance-Leahy Scale: Poor Standing balance comment: leans on sink in standing                           ADL either performed or assessed with clinical judgement   ADL Overall ADL's : Needs assistance/impaired Eating/Feeding: Independent;Sitting   Grooming: Supervision/safety;Standing Grooming Details (indicate cue type and reason): educated in two cup method for toothbrushing, use of wash cloth for washing face at sink Upper Body Bathing: Minimal assistance;Sitting Upper Body Bathing Details (indicate cue type and reason): recommended long handled bath sponge for back Lower Body Bathing: Maximal assistance;Sitting/lateral leans Lower Body Bathing Details (indicate cue type and reason): recommended use of long handled bath sponge and reacher to access feet Upper Body Dressing : Set up;Sitting   Lower Body Dressing: Maximal assistance;Sit to/from stand Lower Body Dressing Details (indicate cue type and reason): Educated in use of AE for LB dressing and safe footwear Toilet Transfer: Supervision/safety;RW;Ambulation   Toileting- Water quality scientist and Hygiene: Min guard;Sit to/from stand Toileting - Clothing Manipulation Details (indicate cue type and reason): Instructed in use of toilet tongs for pericare     Functional mobility during ADLs: Min guard;Rolling walker       Vision Baseline Vision/History: No visual deficits       Perception     Praxis      Pertinent Vitals/Pain Pain Assessment: 0-10 Pain Score: 7  Pain Location: back Pain Descriptors / Indicators: Operative site guarding;Aching;Grimacing Pain Intervention(s): Monitored during session;Repositioned;Premedicated before session     Hand Dominance Right   Extremity/Trunk Assessment Upper Extremity Assessment Upper Extremity Assessment: Overall WFL for tasks assessed   Lower Extremity  Assessment Lower Extremity Assessment: Defer to PT evaluation RLE Deficits / Details: Foot  drop from prior injury; generalized weakness LLE Deficits / Details: Decreased strength and muscular endurance consistent with pre-op diagnosis   Cervical / Trunk Assessment Cervical / Trunk Assessment: Other exceptions Cervical / Trunk Exceptions: s/p back sx , obesity   Communication Communication Communication: No difficulties   Cognition Arousal/Alertness: Awake/alert Behavior During Therapy: WFL for tasks assessed/performed Overall Cognitive Status: Within Functional Limits for tasks assessed                                     General Comments       Exercises     Shoulder Instructions      Home Living Family/patient expects to be discharged to:: Private residence Living Arrangements: Spouse/significant other Available Help at Discharge: Family;Available 24 hours/day Type of Home: House Home Access: Stairs to enter [employer] of Steps: 3-5 Entrance Stairs-Rails: None Home Layout: Two level;Able to live on main level with bedroom/bathroom Alternate Level Stairs-Number of Steps: Flight   Bathroom Shower/Tub: Occupational psychologist: Standard     Home Equipment: Environmental consultant - 2 wheels;Bedside commode;Shower seat;Adaptive equipment Adaptive Equipment: Reacher        Prior Functioning/Environment Level of Independence: Independent                 OT Problem List:        OT Treatment/Interventions:      OT Goals(Current goals can be found in the care plan section) Acute Rehab OT Goals Patient Stated Goal: Home tomorrow; get pain under control  OT Frequency:     Barriers to D/C:            Co-evaluation              AM-PAC OT "6 Clicks" Daily Activity     Outcome Measure Help from another person eating meals?: None Help from another person taking care of personal grooming?: A Little Help from another person toileting, which includes using toliet, bedpan, or urinal?: A Lot Help from another person bathing  (including washing, rinsing, drying)?: A Lot Help from another person to put on and taking off regular upper body clothing?: None Help from another person to put on and taking off regular lower body clothing?: A Lot 6 Click Score: 17   End of Session Equipment Utilized During Treatment: Gait belt;Rolling walker;Back brace  Activity Tolerance: Patient tolerated treatment well Patient left: in bed;with call bell/phone within reach  OT Visit Diagnosis: Pain;Other abnormalities of gait and mobility (R26.89);Unsteadiness on feet (R26.81)                Time: [id number] OT Time Calculation (min): 23 min Charges:  OT General Charges $OT Visit: 1 Visit OT Evaluation $OT Eval Moderate Complexity: 1 Mod OT Treatments $Self Care/Home Management : 8-22 mins  Nestor Lewandowsky, OTR/L Acute Rehabilitation Services Pager: [phone] Office: [phone]  Justin Soto 10/24/2019, 11:37 AM

## 2019-10-24 NOTE — Progress Notes (Addendum)
Subjective: The patient is alert and pleasant.  He was not getting adequate relief with oxycodone 5.  He has had some dysuria but this is improving.  His right leg pain is gone since surgery.  His wife is at the bedside.  Objective: Vital signs in last 24 hours: Temp:  [98 F (36.7 C)-100.8 F (38.2 C)] 100.4 F (38 C) (11/12 1148) Pulse Rate:  [104-124] 119 (11/12 1148) Resp:  [16-21] 18 (11/12 1148) BP: (107-164)/(56-82) 148/77 (11/12 1148) SpO2:  [91 %-99 %] 91 % (11/12 1148) Estimated body mass index is 48 kg/m as calculated from the following:   Height as of this encounter: 5\' 6"  (1.676 m).   Weight as of this encounter: 134.9 kg.   Intake/Output from previous day: 11/11 0701 - 11/12 0700 In: 5898.7 [P.O.:720; I.V.:4278.7; Blood:200; IV Piggyback:700] Out: 3590 [Urine:2990; Blood:600] Intake/Output this shift: Total I/O In: 360 [P.O.:360] Out: 900 [Urine:900]  Physical exam the patient is alert and pleasant.  He is moving his lower extremities well.  Lab Results: Recent Labs    10/24/19 0419  WBC 6.3  HGB 8.4*  HCT 27.2*  PLT 132*   BMET Recent Labs    10/24/19 0419  NA 133*  K 3.8  CL 98  CO2 24  GLUCOSE 176*  BUN 13  CREATININE 1.16  CALCIUM 8.2*    Studies/Results: Dg Lumbar Spine 2-3 Views  Result Date: 10/23/2019 CLINICAL DATA:  L3-4 and L4-5 PLIF. EXAM: LUMBAR SPINE - 2-3 VIEW; DG C-ARM 1-60 MIN COMPARISON:  10/23/2019 and 09/20/2018 FINDINGS: Examination demonstrates 2 intraoperative views of the lumbar spine with placement of posterior fusion hardware with bilateral pedicle screws from L3-L5. Interbody fusion at the L3-4 and L4-5 levels. Vertebral body alignment and heights are normal. There is mild to moderate spondylosis throughout the lumbar spine. Recommend correlation with findings at the time of the procedure. IMPRESSION: Postoperative changes compatible with patient's posterior fusion from L3-L5. Electronically Signed   By: Marin Olp  M.D.   On: 10/23/2019 14:46   Dg Lumbar Spine 1 View  Result Date: 10/23/2019 CLINICAL DATA:  Elective lumbar surgery. EXAM: LUMBAR SPINE - 1 VIEW COMPARISON:  MRI of August 06, 2019. FINDINGS: Single intraoperative cross-table lateral projection of the lumbar spine was obtained. This demonstrates surgical probe directed at approximately the L3-4 level. IMPRESSION: Surgical localization as described above. Electronically Signed   By: Marijo Conception M.D.   On: 10/23/2019 13:53   Dg C-arm 1-60 Min  Result Date: 10/23/2019 CLINICAL DATA:  L3-4 and L4-5 PLIF. EXAM: LUMBAR SPINE - 2-3 VIEW; DG C-ARM 1-60 MIN COMPARISON:  10/23/2019 and 09/20/2018 FINDINGS: Examination demonstrates 2 intraoperative views of the lumbar spine with placement of posterior fusion hardware with bilateral pedicle screws from L3-L5. Interbody fusion at the L3-4 and L4-5 levels. Vertebral body alignment and heights are normal. There is mild to moderate spondylosis throughout the lumbar spine. Recommend correlation with findings at the time of the procedure. IMPRESSION: Postoperative changes compatible with patient's posterior fusion from L3-L5. Electronically Signed   By: Marin Olp M.D.   On: 10/23/2019 14:46    Assessment/Plan: Postop day #1: The patient is mobilizing well.  We have changed him over to Percocet as needed for pain.  Hyponatremia: Mild and asymptomatic  Acute blood loss anemia: I will start the patient on iron.  He is asymptomatic.  I have answered all the patient's questions.  He would likely go home tomorrow.  Hyperglycemia: He is on sliding  scale insulin.  I advised him to follow-up with his medical doctor, Dr. Rosanna Randy, regarding long-term management of his diabetes.  LOS: 1 day     Ophelia Charter 10/24/2019, 1:26 PM

## 2019-10-25 MED ORDER — DOCUSATE SODIUM 100 MG PO CAPS: 100.0000 mg | ORAL_CAPSULE | Freq: Two times a day (BID) | ORAL | 0 refills | Status: DC

## 2019-10-25 MED ORDER — CYCLOBENZAPRINE HCL 10 MG PO TABS: 10.0000 mg | ORAL_TABLET | Freq: Three times a day (TID) | ORAL | 1 refills | Status: DC | PRN

## 2019-10-25 MED ORDER — FERROUS SULFATE 325 (65 FE) MG PO TABS: 325.0000 mg | ORAL_TABLET | Freq: Two times a day (BID) | ORAL | 0 refills | Status: DC

## 2019-10-25 NOTE — Progress Notes (Signed)
Physical Therapy Treatment Patient Details Name: Justin Soto MRN: [id number] DOB: [birth year] Today's Date: 10/25/2019    History of Present Illness Pt is a 58 y/o male who presents s/p L3-L5 PLIF on 10/23/2019. 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    PT Comments    Pt progressing towards physical therapy goals. Was able to perform transfers and ambulation with gross min guard assist to supervision for safety and RW for support. Wife present and educated as well on brace application/wearing schedule, activity progression, and safe assist/guard for stair negotiation. All precautions reviewed with pt as well. Will continue to follow and progress as able per POC.     Follow Up Recommendations  No PT follow up;Supervision for mobility/OOB     Equipment Recommendations  None recommended by PT    Recommendations for Other Services       Precautions / Restrictions Precautions Precautions: Fall;Back Precaution Booklet Issued: Yes (comment) Precaution Comments: Cues for maintenance of precautions during functional mobility.  Required Braces or Orthoses: Spinal Brace Spinal Brace: Lumbar corset;Applied in standing position Restrictions Weight Bearing Restrictions: No    Mobility  Bed Mobility               General bed mobility comments: Pt sitting up on EOB when PT arrived.   Transfers Overall transfer level: Needs assistance Equipment used: Rolling walker (2 wheeled) Transfers: Sit to/from Stand Sit to Stand: Supervision         General transfer comment: Supervision for safety as pt powered up to full stand. Increased time due to pain/weakness.   Ambulation/Gait Ambulation/Gait assistance: Min guard;Supervision Gait Distance (Feet): 300 Feet Assistive device: Rolling walker (2 wheeled) Gait Pattern/deviations: Step-through pattern;Decreased stride length;Trunk  flexed Gait velocity: Decreased Gait velocity interpretation: <1.8 ft/sec, indicate of risk for recurrent falls General Gait Details: VC's for improved posture, forward gaze, and closer walker proximity.    Stairs Stairs: Yes Stairs assistance: Mod assist;+2 physical assistance;+2 safety/equipment Stair Management: No rails;Step to pattern;Forwards Number of Stairs: 4 General stair comments: Pt was able to negotiate 4 stairs with PT and wife on either side to simulate home environment. Pt was cued for sequencing and general safety, and pt's wife was cued on safe guarding/assist on stairs. Issued gait belt for home to help facilitate safe entry to home.    Wheelchair Mobility    Modified Rankin (Stroke Patients Only)       Balance Overall balance assessment: Needs assistance Sitting-balance support: No upper extremity supported Sitting balance-Leahy Scale: Fair     Standing balance support: Single extremity supported;During functional activity Standing balance-Leahy Scale: Fair                              Cognition Arousal/Alertness: Awake/alert Behavior During Therapy: WFL for tasks assessed/performed Overall Cognitive Status: Within Functional Limits for tasks assessed                                        Exercises      General Comments        Pertinent Vitals/Pain Pain Assessment: Faces Faces Pain Scale: Hurts little more Pain Location: back Pain Descriptors / Indicators: Operative site guarding;Aching;Grimacing Pain Intervention(s): Limited activity within patient's tolerance;Monitored during session;Repositioned  Home Living                      Prior Function            PT Goals (current goals can now be found in the care plan section) Acute Rehab PT Goals Patient Stated Goal: Home today PT Goal Formulation: With patient Time For Goal Achievement: 10/31/19 Potential to Achieve Goals: Good Progress towards  PT goals: Progressing toward goals    Frequency    Min 5X/week      PT Plan Current plan remains appropriate    Co-evaluation              AM-PAC PT "6 Clicks" Mobility   Outcome Measure  Help needed turning from your back to your side while in a flat bed without using bedrails?: None Help needed moving from lying on your back to sitting on the side of a flat bed without using bedrails?: None Help needed moving to and from a bed to a chair (including a wheelchair)?: A Little Help needed standing up from a chair using your arms (e.g., wheelchair or bedside chair)?: A Little Help needed to walk in hospital room?: A Little Help needed climbing 3-5 steps with a railing? : A Little 6 Click Score: 20    End of Session Equipment Utilized During Treatment: Gait belt;Back brace Activity Tolerance: Patient tolerated treatment well Patient left: with call bell/phone within reach;with family/visitor present(Sitting EOB) Nurse Communication: Mobility status PT Visit Diagnosis: Unsteadiness on feet (R26.81);Other symptoms and signs involving the nervous system (R29.898);Pain Pain - part of body: (back)     Time: [id number] PT Time Calculation (min) (ACUTE ONLY): 19 min  Charges:  $Gait Training: 8-22 mins                     Rolinda Roan, PT, DPT Acute Rehabilitation Services Pager: [phone] Office: [phone]    Thelma Comp 10/25/2019, 9:32 AM

## 2019-10-25 NOTE — Discharge Summary (Signed)
Physician Discharge Summary  Patient ID: Justin Soto MRN: [id number] DOB/AGE: 1961/03/06 58 y.o.  Admit date: 10/23/2019 Discharge date: 10/25/2019  Admission Diagnoses: Lumbar spinal stenosis, lumbar radiculopathy, lumbago, lumbar degenerative disease, neurogenic claudication  Discharge Diagnoses: The same with postoperative blood loss anemia Active Problems:   Lumbar stenosis with neurogenic claudication   Discharged Condition: good  Hospital Course: I performed an L3-4 and L4-5 decompression instrumentation and fusion and bilateral L5-S1 laminotomy/foraminotomies on the patient on 10/23/2019.  The surgery went well.  The patient's postoperative course was remarkable for postoperative anemia and a hemoglobin of 8.4.  He was asymptomatic.  I started him on iron.  On postoperative day #2 the patient requested discharge to home.  He was given written and oral discharge instructions.  All his questions were answered.  I sent in his Percocet via my office EMR.  Consults: Physical therapy, Occupational Therapy, care management Significant Diagnostic Studies: None Treatments: L3-4 and L4-5 decompression, instrumentation and fusion, bilateral L5-S1 laminotomy/foraminotomies Discharge Exam: Blood pressure (!) 164/88, pulse (!) 108, temperature 98.7 F (37.1 C), temperature source Oral, resp. rate 19, height 5\' 6"  (1.676 m), weight 134.9 kg, SpO2 94 %. The patient is alert and pleasant.  He looks well.  His strength is normal.  Disposition: Home  Discharge Instructions    Call MD for:  difficulty breathing, headache or visual disturbances   Complete by: As directed    Call MD for:  extreme fatigue   Complete by: As directed    Call MD for:  hives   Complete by: As directed    Call MD for:  persistant dizziness or light-headedness   Complete by: As directed    Call MD for:  persistant nausea and vomiting   Complete by: As directed    Call MD for:  redness, tenderness, or signs of  infection (pain, swelling, redness, odor or green/yellow discharge around incision site)   Complete by: As directed    Call MD for:  severe uncontrolled pain   Complete by: As directed    Call MD for:  temperature >100.4   Complete by: As directed    Diet - low sodium heart healthy   Complete by: As directed    Discharge instructions   Complete by: As directed    Call [phone] for a followup appointment. Take a stool softener while you are using pain medications.   Driving Restrictions   Complete by: As directed    Do not drive for 2 weeks.   Increase activity slowly   Complete by: As directed    Lifting restrictions   Complete by: As directed    Do not lift more than 5 pounds. No excessive bending or twisting.   May shower / Bathe   Complete by: As directed    Remove the dressing for 3 days after surgery.  You may shower, but leave the incision alone.   Remove dressing in 24 hours   Complete by: As directed      Allergies as of 10/25/2019      Reactions   Methocarbamol Other (See Comments)   Numbness, and change of taste    Vicodin [hydrocodone-acetaminophen] Itching   Has to use benadryl   Tizanidine Nausea Only      Medication List    STOP taking these medications   meloxicam 15 MG tablet Commonly known as: MOBIC     TAKE these medications   amLODipine-benazepril 10-40 MG capsule Commonly known as: LOTREL TAKE 1  CAPSULE BY MOUTH EVERY DAY What changed: how much to take   aspirin EC 81 MG tablet Take 81 mg by mouth daily.   atorvastatin 40 MG tablet Commonly known as: LIPITOR Take 40 mg by mouth daily.   cetirizine 10 MG tablet Commonly known as: ZYRTEC TAKE ONE TO TWO TABLETS BY MOUTH EVERY DAY AS NEEDED. What changed:   how much to take  how to take this  when to take this  additional instructions   cyclobenzaprine 10 MG tablet Commonly known as: FLEXERIL Take 1 tablet (10 mg total) by mouth 3 (three) times daily as needed for muscle  spasms. What changed:   medication strength  how much to take  Another medication with the same name was removed. Continue taking this medication, and follow the directions you see here.   docusate sodium 100 MG capsule Commonly known as: COLACE Take 1 capsule (100 mg total) by mouth 2 (two) times daily.   ferrous sulfate 325 (65 FE) MG tablet Take 1 tablet (325 mg total) by mouth 2 (two) times daily with a meal.   finasteride 5 MG tablet Commonly known as: PROSCAR Take 1 tablet (5 mg total) by mouth daily.   fluticasone 44 MCG/ACT inhaler Commonly known as: FLOVENT HFA Inhale 2 puffs into the lungs 2 (two) times daily.   fluticasone 50 MCG/ACT nasal spray Commonly known as: FLONASE USE TWO SPRAYS IN EACH NOSTRIL EVERY DAY   gabapentin 300 MG capsule Commonly known as: NEURONTIN Take 600 mg by mouth 2 (two) times daily. What changed: Another medication with the same name was removed. Continue taking this medication, and follow the directions you see here.   hydrochlorothiazide 25 MG tablet Commonly known as: HYDRODIURIL TAKE ONE TABLET BY MOUTH EVERY DAY   metoprolol tartrate 25 MG tablet Commonly known as: LOPRESSOR TAKE 1 TABLET BY MOUTH EVERY DAY   montelukast 10 MG tablet Commonly known as: SINGULAIR TAKE ONE TABLET BY MOUTH EVERY MORNING What changed: when to take this   multivitamin with minerals Tabs tablet Take 1 tablet by mouth daily.   omeprazole 10 MG capsule Commonly known as: PRILOSEC Take 10 mg by mouth daily.   tamsulosin 0.4 MG Caps capsule Commonly known as: FLOMAX TAKE TWO CAPSULES EACH DAY AS DIRECTED What changed:   how much to take  how to take this  when to take this  additional instructions            Durable Medical Equipment  (From admission, onward)         Start     Ordered   10/25/19 0902  For home use only DME Walker  Once    Question:  Patient needs a walker to treat with the following condition  Answer:  S/P  lumbar spinal fusion   10/25/19 0901           Signed: Ophelia Charter 10/25/2019, 10:54 AM

## 2019-10-25 NOTE — Plan of Care (Signed)
Patient alert and oriented, mae's well, voiding adequate amount of urine, swallowing without difficulty, c/o soreness at time of discharge and medication given prior to discharged. Patient discharged home with family. Script and discharged instructions given to patient. Patient and family stated understanding of instructions given. Patient has an appointment with Dr. Arnoldo Morale

## 2019-10-29 MED FILL — Heparin Sodium (Porcine) Inj 1000 Unit/ML: INTRAMUSCULAR | Qty: 30 | Status: AC

## 2019-10-29 MED FILL — Sodium Chloride Irrigation Soln 0.9%: Qty: 3000 | Status: AC

## 2019-10-29 MED FILL — Sodium Chloride IV Soln 0.9%: INTRAVENOUS | Qty: 1000 | Status: AC

## 2019-10-31 ENCOUNTER — Encounter: Payer: Self-pay | Admitting: Family Medicine

## 2019-10-31 ENCOUNTER — Other Ambulatory Visit: Payer: Self-pay

## 2019-10-31 ENCOUNTER — Ambulatory Visit
Admission: RE | Admit: 2019-10-31 | Discharge: 2019-10-31 | Disposition: A | Discharge status: Home / Self Care | Payer: BC Managed Care – PPO | Source: Ambulatory Visit | Attending: Family Medicine | Admitting: Family Medicine

## 2019-10-31 ENCOUNTER — Ambulatory Visit: Payer: Self-pay | Admitting: *Deleted

## 2019-10-31 ENCOUNTER — Telehealth: Payer: Self-pay

## 2019-10-31 ENCOUNTER — Telehealth (INDEPENDENT_AMBULATORY_CARE_PROVIDER_SITE_OTHER): Payer: BC Managed Care – PPO | Admitting: Family Medicine

## 2019-10-31 VITALS — Temp 103.0°F

## 2019-10-31 DIAGNOSIS — R5082 Postprocedural fever: Secondary | ICD-10-CM | POA: Diagnosis not present

## 2019-10-31 DIAGNOSIS — R300 Dysuria: Secondary | ICD-10-CM

## 2019-10-31 DIAGNOSIS — M79662 Pain in left lower leg: Secondary | ICD-10-CM

## 2019-10-31 LAB — POCT URINALYSIS DIPSTICK
Bilirubin, UA: NEGATIVE
Blood, UA: NEGATIVE
Glucose, UA: NEGATIVE
Ketones, UA: NEGATIVE
Leukocytes, UA: NEGATIVE
Nitrite, UA: NEGATIVE
Protein, UA: NEGATIVE
Spec Grav, UA: 1.015 (ref 1.010–1.025)
Urobilinogen, UA: 0.2 E.U./dL
pH, UA: 6.5 (ref 5.0–8.0)

## 2019-10-31 NOTE — Telephone Encounter (Signed)
From [facility] 

## 2019-10-31 NOTE — Telephone Encounter (Signed)
Patient is post surgical from back surgery- 11/11. Patient has been having temperature that spikes twice daily- Tylenol does bring it down. Patient is hydrating and incision is clean and dry.  Wife is concerned that patient may have developed UTI from catheter. Patient can collect sterile urine if needed. Call to office for appointment   Reason for Disposition . [1] Fever > 100.0 F (37.8 C) AND [2] bedridden (e.g., nursing home patient, CVA, chronic illness, recovering from surgery)  Answer Assessment - Initial Assessment Questions 1. TEMPERATURE: "What is the most recent temperature?"  "How was it measured?"      12:00 102- 98.7 now 2. ONSET: "When did the fever start?"      Since home from hospital 3. SYMPTOMS: "Do you have any other symptoms besides the fever?"  (e.g., colds, headache, sore throat, earache, cough, rash, diarrhea, vomiting, abdominal pain)     Urinary discomfort, (sore throat- post surgical) 4. CAUSE: If there are no symptoms, ask: "What do you think is causing the fever?"      Patient is post surgical- back surgery, did have catheter in place 5. CONTACTS: "Does anyone else in the family have an infection?"     no 6. TREATMENT: "What have you done so far to treat this fever?" (e.g., medications)     Tylenol- brings down 7. IMMUNOCOMPROMISE: "Do you have of the following: diabetes, HIV positive, splenectomy, cancer chemotherapy, chronic steroid treatment, transplant patient, etc."     Recent surgery 8. PREGNANCY: "Is there any chance you are pregnant?" "When was your last menstrual period?"     n/a 9. TRAVEL: "Have you traveled out of the country in the last month?" (e.g., travel history, exposures)     no  Protocols used: FEVER-A-AH

## 2019-10-31 NOTE — Telephone Encounter (Signed)
Patient advised as below. Patient reports he has not received a call back from surgeon.

## 2019-10-31 NOTE — Progress Notes (Deleted)
{Method of visit:23308}  Patient: Justin Soto Male    DOB: May 03, 1961   58 y.o.   MRN: [id number] Visit Date: 10/31/2019  Today's Provider: Lavon Paganini, MD   Chief Complaint  Patient presents with  . Fever   Subjective:    Virtual Visit via Video Note  I connected with Justin Soto on 10/31/19 at  1:40 PM EST by a video enabled telemedicine application and verified that I am speaking with the correct person using two identifiers.  Location: Patient: *** Provider: ***   I discussed the limitations of evaluation and management by telemedicine and the availability of in person appointments. The patient expressed understanding and agreed to proceed.  HPI   Allergies  Allergen Reactions  . Methocarbamol Other (See Comments)    Numbness, and change of taste   . Vicodin [Hydrocodone-Acetaminophen] Itching    Has to use benadryl  . Tizanidine Nausea Only     Current Outpatient Medications:  .  amLODipine-benazepril (LOTREL) 10-40 MG capsule, TAKE 1 CAPSULE BY MOUTH EVERY DAY (Patient taking differently: Take 1 capsule by mouth daily. ), Disp: 90 capsule, Rfl: 3 .  aspirin EC 81 MG tablet, Take 81 mg by mouth daily., Disp: , Rfl:  .  atorvastatin (LIPITOR) 40 MG tablet, Take 40 mg by mouth daily., Disp: , Rfl:  .  cetirizine (ZYRTEC) 10 MG tablet, TAKE ONE TO TWO TABLETS BY MOUTH EVERY DAY AS NEEDED. (Patient taking differently: Take 10 mg by mouth daily. ), Disp: 180 tablet, Rfl: 4 .  cyclobenzaprine (FLEXERIL) 10 MG tablet, Take 1 tablet (10 mg total) by mouth 3 (three) times daily as needed for muscle spasms., Disp: 50 tablet, Rfl: 1 .  docusate sodium (COLACE) 100 MG capsule, Take 1 capsule (100 mg total) by mouth 2 (two) times daily., Disp: 60 capsule, Rfl: 0 .  ferrous sulfate 325 (65 FE) MG tablet, Take 1 tablet (325 mg total) by mouth 2 (two) times daily with a meal., Disp: 60 tablet, Rfl: 0 .  finasteride (PROSCAR) 5 MG tablet, Take 1 tablet (5 mg total) by  mouth daily., Disp: 90 tablet, Rfl: 3 .  fluticasone (FLONASE) 50 MCG/ACT nasal spray, USE TWO SPRAYS IN EACH NOSTRIL EVERY DAY (Patient taking differently: Place 2 sprays into both nostrils daily. ), Disp: 48 g, Rfl: 4 .  gabapentin (NEURONTIN) 300 MG capsule, Take 600 mg by mouth 2 (two) times daily., Disp: , Rfl:  .  hydrochlorothiazide (HYDRODIURIL) 25 MG tablet, TAKE ONE TABLET BY MOUTH EVERY DAY (Patient taking differently: Take 25 mg by mouth daily. ), Disp: 90 tablet, Rfl: 3 .  metoprolol tartrate (LOPRESSOR) 25 MG tablet, TAKE 1 TABLET BY MOUTH EVERY DAY (Patient taking differently: Take 25 mg by mouth daily. ), Disp: 90 tablet, Rfl: 3 .  montelukast (SINGULAIR) 10 MG tablet, TAKE ONE TABLET BY MOUTH EVERY MORNING (Patient taking differently: Take 10 mg by mouth daily. ), Disp: 90 tablet, Rfl: 3 .  Multiple Vitamin (MULTIVITAMIN WITH MINERALS) TABS tablet, Take 1 tablet by mouth daily., Disp: , Rfl:  .  omeprazole (PRILOSEC) 10 MG capsule, Take 10 mg by mouth daily., Disp: , Rfl:  .  tamsulosin (FLOMAX) 0.4 MG CAPS capsule, TAKE TWO CAPSULES EACH DAY AS DIRECTED (Patient taking differently: Take 0.8 mg by mouth daily. ), Disp: 180 capsule, Rfl: 2  Review of Systems  Social History   Tobacco Use  . Smoking status: Never Smoker  . Smokeless tobacco: Never Used  Substance Use Topics  . Alcohol use: No      Objective:   There were no vitals taken for this visit. There were no vitals filed for this visit.There is no height or weight on file to calculate BMI.   Physical Exam   No results found for any visits on 10/31/19.     Assessment & Plan    I discussed the assessment and treatment plan with the patient. The patient was provided an opportunity to ask questions and all were answered. The patient agreed with the plan and demonstrated an understanding of the instructions.   The patient was advised to call back or seek an in-person evaluation if the symptoms worsen or if the  condition fails to improve as anticipated.  I provided *** minutes of non-face-to-face time during this encounter.      Lavon Paganini, MD  [facility]

## 2019-10-31 NOTE — Addendum Note (Signed)
Addended by: Shawna Orleans on: 10/31/2019 04:22 PM   Modules accepted: Orders

## 2019-10-31 NOTE — Progress Notes (Signed)
Patient: Justin Soto Male    DOB: 01/25/1961   58 y.o.   MRN: [id number] Visit Date: 10/31/2019  Today's Provider: Lavon Paganini, MD   Chief Complaint  Patient presents with  . Fever   Subjective:    Virtual Visit via Telephone Note  I connected with Justin Soto on 10/31/19 at  1:40 PM EST by telephone and verified that I am speaking with the correct person using two identifiers.   Patient location: home Provider location: [place] involved in the visit: patient, provider   I discussed the limitations, risks, security and privacy concerns of performing an evaluation and management service by telephone and the availability of in person appointments. I also discussed with the patient that there may be a patient responsible charge related to this service. The patient expressed understanding and agreed to proceed.   Fever  This is a new problem. Episode onset: 8 days ago after surgery. The problem occurs intermittently. Associated symptoms include congestion (chest congestion), a sore throat and urinary pain. Pertinent negatives include no abdominal pain, chest pain, coughing, ear pain, headaches, nausea, vomiting or wheezing. He has tried acetaminophen for the symptoms. The treatment provided moderate relief.    Fever started after surgery while still hospitalized.  Tmax 103. Occurs every day.  Has chronic cough which is at baseline. No SOB. No sick contacts.  +L calf pain, no swelling.  Was Rx'd prednisone for it be NSG, but it did not resolve.  Feels different than R radicular pain pre-op.  Wife reports that wound is normal with no drainage or erythema.  He had catheter for a few days post-op and now has dysuria.  No retention or incontinence.   Allergies  Allergen Reactions  . Methocarbamol Other (See Comments)    Numbness, and change of taste   . Vicodin [Hydrocodone-Acetaminophen] Itching    Has to use benadryl  . Tizanidine Nausea Only      Current Outpatient Medications:  .  amLODipine-benazepril (LOTREL) 10-40 MG capsule, TAKE 1 CAPSULE BY MOUTH EVERY DAY (Patient taking differently: Take 1 capsule by mouth daily. ), Disp: 90 capsule, Rfl: 3 .  aspirin EC 81 MG tablet, Take 81 mg by mouth daily., Disp: , Rfl:  .  atorvastatin (LIPITOR) 40 MG tablet, Take 40 mg by mouth daily., Disp: , Rfl:  .  cetirizine (ZYRTEC) 10 MG tablet, TAKE ONE TO TWO TABLETS BY MOUTH EVERY DAY AS NEEDED. (Patient taking differently: Take 10 mg by mouth daily. ), Disp: 180 tablet, Rfl: 4 .  cyclobenzaprine (FLEXERIL) 10 MG tablet, Take 1 tablet (10 mg total) by mouth 3 (three) times daily as needed for muscle spasms., Disp: 50 tablet, Rfl: 1 .  docusate sodium (COLACE) 100 MG capsule, Take 1 capsule (100 mg total) by mouth 2 (two) times daily., Disp: 60 capsule, Rfl: 0 .  ferrous sulfate 325 (65 FE) MG tablet, Take 1 tablet (325 mg total) by mouth 2 (two) times daily with a meal., Disp: 60 tablet, Rfl: 0 .  finasteride (PROSCAR) 5 MG tablet, Take 1 tablet (5 mg total) by mouth daily., Disp: 90 tablet, Rfl: 3 .  fluticasone (FLONASE) 50 MCG/ACT nasal spray, USE TWO SPRAYS IN EACH NOSTRIL EVERY DAY (Patient taking differently: Place 2 sprays into both nostrils daily. ), Disp: 48 g, Rfl: 4 .  gabapentin (NEURONTIN) 300 MG capsule, Take 600 mg by mouth 2 (two) times daily., Disp: , Rfl:  .  hydrochlorothiazide (HYDRODIURIL) 25 MG tablet, TAKE ONE TABLET BY MOUTH EVERY DAY (Patient taking differently: Take 25 mg by mouth daily. ), Disp: 90 tablet, Rfl: 3 .  metoprolol tartrate (LOPRESSOR) 25 MG tablet, TAKE 1 TABLET BY MOUTH EVERY DAY (Patient taking differently: Take 25 mg by mouth daily. ), Disp: 90 tablet, Rfl: 3 .  montelukast (SINGULAIR) 10 MG tablet, TAKE ONE TABLET BY MOUTH EVERY MORNING (Patient taking differently: Take 10 mg by mouth daily. ), Disp: 90 tablet, Rfl: 3 .  Multiple Vitamin (MULTIVITAMIN WITH MINERALS) TABS tablet, Take 1 tablet by mouth  daily., Disp: , Rfl:  .  omeprazole (PRILOSEC) 10 MG capsule, Take 10 mg by mouth daily., Disp: , Rfl:  .  tamsulosin (FLOMAX) 0.4 MG CAPS capsule, TAKE TWO CAPSULES EACH DAY AS DIRECTED (Patient taking differently: Take 0.8 mg by mouth daily. ), Disp: 180 capsule, Rfl: 2  Review of Systems  Constitutional: Positive for fever. Negative for appetite change, chills, diaphoresis and fatigue.  HENT: Positive for congestion (chest congestion) and sore throat. Negative for ear pain.   Respiratory: Negative for cough, chest tightness, shortness of breath and wheezing.   Cardiovascular: Negative for chest pain and palpitations.  Gastrointestinal: Negative for abdominal pain, nausea and vomiting.  Genitourinary: Positive for dysuria.  Neurological: Negative for headaches.    Social History   Tobacco Use  . Smoking status: Never Smoker  . Smokeless tobacco: Never Used  Substance Use Topics  . Alcohol use: No      Objective:   Temp (!) 103 F (39.4 C)  Vitals:   10/31/19 1342  Temp: (!) 103 F (39.4 C)  There is no height or weight on file to calculate BMI.   Physical Exam Speaking in full sentences in NAD Reports L calf TTP and pain in L calf with foot dorsiflexion  No results found for any visits on 10/31/19.     Assessment & Plan   I discussed the assessment and treatment plan with the patient. The patient was provided an opportunity to ask questions and all were answered. The patient agreed with the plan and demonstrated an understanding of the instructions.   The patient was advised to call back or seek an in-person evaluation if the symptoms worsen or if the condition fails to improve as anticipated.   1. Postoperative fever 2. Pain of left calf 3. Dysuria - new problem since surgery on 11/11 - Describes wound as healing well with no signs of infection - denies any respiratory symptoms, but consider CXR in the future - given unilateral calf pain, obtain DVT study STAT  - UA to r/o UTI given recent catheter use in hospital - treatment pending results - consider lab work, but patient not currently in office due to fever in setting of pandemic - discussed strict return/need for in person eval precautions - US Venous Img Lower Unilateral Left; Future   The entirety of the information documented in the History of Present Illness, Review of Systems and Physical Exam were personally obtained by me. Portions of this information were initially documented by Meyer Cory , CMA and reviewed by me for thoroughness and accuracy.    Coreena Rubalcava, Dionne Bucy, MD MPH [facility]

## 2019-10-31 NOTE — Telephone Encounter (Signed)
-----   Message from Virginia Crews, MD sent at 10/31/2019  4:11 PM EST ----- Normal ultrasound of the lower extremity with no evidence of DVT.  UA was also normal without any signs of UTI.  No cause for his postop fevers found on this evaluation.  Recommend Covid testing tomorrow at the outpatient drive up site.  If he develops any shortness of breath or worsening of his chronic cough, we will obtain a chest x-ray.  Please let us know what the medication that the surgeon sent in for the fever was called.  If fever persists despite this medication he was advised to take, we will do more blood work to see if we can find another cause for the fever

## 2019-10-31 NOTE — Telephone Encounter (Signed)
Please advise 

## 2019-11-01 ENCOUNTER — Other Ambulatory Visit: Payer: Self-pay

## 2019-11-01 DIAGNOSIS — Z20822 Contact with and (suspected) exposure to covid-19: Secondary | ICD-10-CM

## 2019-11-02 LAB — URINE CULTURE

## 2019-11-03 LAB — NOVEL CORONAVIRUS, NAA: SARS-CoV-2, NAA: DETECTED — AB

## 2019-11-04 ENCOUNTER — Telehealth: Payer: Self-pay

## 2019-11-04 NOTE — Telephone Encounter (Signed)
Pt advised.  He was also tested for Covid which came back positive.  He states is is still running fevers.  Thanks,   -Justin Soto

## 2019-11-04 NOTE — Telephone Encounter (Signed)
Patient advised. He states his O2 is staying between 82-89% and experiencing SOB. Patient advised to go to the ER.

## 2019-11-04 NOTE — Telephone Encounter (Signed)
-----   Message from Virginia Crews, MD sent at 11/04/2019 12:28 PM EST ----- Negative urine culture. Still having fevers?  Did he end up being treated by his surgeon?

## 2019-11-04 NOTE — Telephone Encounter (Signed)
Likely that fevers are secondary to COVID19 infection.  Recommend self isolation until at least 10 days from start of symptoms and until fever free for at least 2 days.  He can take Tylenol as needed for fevers.  If he develops any shortness of breath or other concerning symptoms, he should seek medical assistance

## 2019-11-22 DIAGNOSIS — M5136 Other intervertebral disc degeneration, lumbar region: Secondary | ICD-10-CM | POA: Diagnosis not present

## 2019-11-28 ENCOUNTER — Ambulatory Visit (HOSPITAL_COMMUNITY): Payer: BC Managed Care – PPO | Admitting: Anesthesiology

## 2019-11-28 ENCOUNTER — Inpatient Hospital Stay (HOSPITAL_COMMUNITY)
Admission: RE | Admit: 2019-11-28 | Discharge: 2019-12-02 | DRG: 857 | Disposition: A | Discharge status: Home / Self Care | Payer: BC Managed Care – PPO | Source: Ambulatory Visit | Attending: Neurosurgery | Admitting: Neurosurgery

## 2019-11-28 ENCOUNTER — Encounter (HOSPITAL_COMMUNITY): Payer: Self-pay | Admitting: Neurosurgery

## 2019-11-28 ENCOUNTER — Other Ambulatory Visit: Payer: Self-pay | Admitting: Neurosurgery

## 2019-11-28 ENCOUNTER — Encounter (HOSPITAL_COMMUNITY)
Admission: RE | Disposition: A | Discharge status: Home / Self Care | Payer: Self-pay | Source: Ambulatory Visit | Attending: Neurosurgery

## 2019-11-28 ENCOUNTER — Other Ambulatory Visit: Payer: Self-pay

## 2019-11-28 DIAGNOSIS — Z8614 Personal history of Methicillin resistant Staphylococcus aureus infection: Secondary | ICD-10-CM | POA: Diagnosis not present

## 2019-11-28 DIAGNOSIS — Z8042 Family history of malignant neoplasm of prostate: Secondary | ICD-10-CM

## 2019-11-28 DIAGNOSIS — Z79899 Other long term (current) drug therapy: Secondary | ICD-10-CM

## 2019-11-28 DIAGNOSIS — R400 Somnolence: Secondary | ICD-10-CM | POA: Diagnosis present

## 2019-11-28 DIAGNOSIS — T8149XA Infection following a procedure, other surgical site, initial encounter: Secondary | ICD-10-CM | POA: Diagnosis not present

## 2019-11-28 DIAGNOSIS — Z8619 Personal history of other infectious and parasitic diseases: Secondary | ICD-10-CM

## 2019-11-28 DIAGNOSIS — M1990 Unspecified osteoarthritis, unspecified site: Secondary | ICD-10-CM | POA: Diagnosis present

## 2019-11-28 DIAGNOSIS — K8020 Calculus of gallbladder without cholecystitis without obstruction: Secondary | ICD-10-CM | POA: Diagnosis not present

## 2019-11-28 DIAGNOSIS — H9190 Unspecified hearing loss, unspecified ear: Secondary | ICD-10-CM | POA: Diagnosis present

## 2019-11-28 DIAGNOSIS — Z981 Arthrodesis status: Secondary | ICD-10-CM | POA: Diagnosis not present

## 2019-11-28 DIAGNOSIS — Z7982 Long term (current) use of aspirin: Secondary | ICD-10-CM

## 2019-11-28 DIAGNOSIS — B9562 Methicillin resistant Staphylococcus aureus infection as the cause of diseases classified elsewhere: Secondary | ICD-10-CM | POA: Diagnosis present

## 2019-11-28 DIAGNOSIS — Z820 Family history of epilepsy and other diseases of the nervous system: Secondary | ICD-10-CM | POA: Diagnosis not present

## 2019-11-28 DIAGNOSIS — I10 Essential (primary) hypertension: Secondary | ICD-10-CM | POA: Diagnosis not present

## 2019-11-28 DIAGNOSIS — Z833 Family history of diabetes mellitus: Secondary | ICD-10-CM | POA: Diagnosis not present

## 2019-11-28 DIAGNOSIS — M79604 Pain in right leg: Secondary | ICD-10-CM | POA: Diagnosis present

## 2019-11-28 DIAGNOSIS — D649 Anemia, unspecified: Secondary | ICD-10-CM | POA: Diagnosis not present

## 2019-11-28 DIAGNOSIS — Z6841 Body Mass Index (BMI) 40.0 and over, adult: Secondary | ICD-10-CM | POA: Diagnosis not present

## 2019-11-28 DIAGNOSIS — E669 Obesity, unspecified: Secondary | ICD-10-CM | POA: Diagnosis present

## 2019-11-28 DIAGNOSIS — M542 Cervicalgia: Secondary | ICD-10-CM | POA: Diagnosis present

## 2019-11-28 DIAGNOSIS — T8141XA Infection following a procedure, superficial incisional surgical site, initial encounter: Principal | ICD-10-CM | POA: Diagnosis present

## 2019-11-28 DIAGNOSIS — Z888 Allergy status to other drugs, medicaments and biological substances status: Secondary | ICD-10-CM

## 2019-11-28 DIAGNOSIS — Z885 Allergy status to narcotic agent status: Secondary | ICD-10-CM | POA: Diagnosis not present

## 2019-11-28 DIAGNOSIS — K219 Gastro-esophageal reflux disease without esophagitis: Secondary | ICD-10-CM | POA: Diagnosis not present

## 2019-11-28 DIAGNOSIS — R700 Elevated erythrocyte sedimentation rate: Secondary | ICD-10-CM | POA: Diagnosis not present

## 2019-11-28 DIAGNOSIS — Z8262 Family history of osteoporosis: Secondary | ICD-10-CM | POA: Diagnosis not present

## 2019-11-28 DIAGNOSIS — F329 Major depressive disorder, single episode, unspecified: Secondary | ICD-10-CM | POA: Diagnosis present

## 2019-11-28 DIAGNOSIS — Z8249 Family history of ischemic heart disease and other diseases of the circulatory system: Secondary | ICD-10-CM | POA: Diagnosis not present

## 2019-11-28 DIAGNOSIS — G4730 Sleep apnea, unspecified: Secondary | ICD-10-CM | POA: Diagnosis not present

## 2019-11-28 DIAGNOSIS — Y838 Other surgical procedures as the cause of abnormal reaction of the patient, or of later complication, without mention of misadventure at the time of the procedure: Secondary | ICD-10-CM | POA: Diagnosis present

## 2019-11-28 DIAGNOSIS — T8142XA Infection following a procedure, deep incisional surgical site, initial encounter: Secondary | ICD-10-CM | POA: Diagnosis not present

## 2019-11-28 DIAGNOSIS — T8463XA Infection and inflammatory reaction due to internal fixation device of spine, initial encounter: Secondary | ICD-10-CM | POA: Diagnosis not present

## 2019-11-28 HISTORY — PX: LUMBAR WOUND DEBRIDEMENT: SHX1988

## 2019-11-28 LAB — CBC
HCT: 26.7 % — ABNORMAL LOW (ref 39.0–52.0)
Hemoglobin: 8.1 g/dL — ABNORMAL LOW (ref 13.0–17.0)
MCH: 19.3 pg — ABNORMAL LOW (ref 26.0–34.0)
MCHC: 30.3 g/dL (ref 30.0–36.0)
MCV: 63.7 fL — ABNORMAL LOW (ref 80.0–100.0)
Platelets: 336 10*3/uL (ref 150–400)
RBC: 4.19 MIL/uL — ABNORMAL LOW (ref 4.22–5.81)
RDW: 17.2 % — ABNORMAL HIGH (ref 11.5–15.5)
WBC: 6.6 10*3/uL (ref 4.0–10.5)
nRBC: 0.3 % — ABNORMAL HIGH (ref 0.0–0.2)

## 2019-11-28 LAB — BASIC METABOLIC PANEL
Anion gap: 12 (ref 5–15)
BUN: 17 mg/dL (ref 6–20)
CO2: 24 mmol/L (ref 22–32)
Calcium: 9.1 mg/dL (ref 8.9–10.3)
Chloride: 103 mmol/L (ref 98–111)
Creatinine, Ser: 1.18 mg/dL (ref 0.61–1.24)
GFR calc Af Amer: >60 mL/min (ref >60)
GFR calc non Af Amer: >60 mL/min (ref >60)
Glucose, Bld: 108 mg/dL — ABNORMAL HIGH (ref 70–99)
Potassium: 3.3 mmol/L — ABNORMAL LOW (ref 3.5–5.1)
Sodium: 139 mmol/L (ref 135–145)

## 2019-11-28 SURGERY — LUMBAR WOUND DEBRIDEMENT
Anesthesia: General

## 2019-11-28 MED ORDER — SUCCINYLCHOLINE CHLORIDE 200 MG/10ML IV SOSY
PREFILLED_SYRINGE | INTRAVENOUS | Status: AC
  Filled 2019-11-28: qty 10

## 2019-11-28 MED ORDER — FINASTERIDE 5 MG PO TABS
5.0000 mg | ORAL_TABLET | Freq: Every day | ORAL | Status: DC
  Administered 2019-11-29 – 2019-12-02 (×4): 5 mg via ORAL
  Filled 2019-11-28 (×4): qty 1

## 2019-11-28 MED ORDER — MENTHOL 3 MG MT LOZG: 1.0000 | LOZENGE | OROMUCOSAL | Status: DC | PRN

## 2019-11-28 MED ORDER — FENTANYL CITRATE (PF) 100 MCG/2ML IJ SOLN
INTRAMUSCULAR | Status: AC
  Filled 2019-11-28: qty 2

## 2019-11-28 MED ORDER — MIDAZOLAM HCL 2 MG/2ML IJ SOLN
INTRAMUSCULAR | Status: DC | PRN
  Administered 2019-11-28: 2 mg via INTRAVENOUS

## 2019-11-28 MED ORDER — SODIUM CHLORIDE 0.9 % IV SOLN: INTRAVENOUS | Status: DC | PRN

## 2019-11-28 MED ORDER — ROCURONIUM BROMIDE 10 MG/ML (PF) SYRINGE
PREFILLED_SYRINGE | INTRAVENOUS | Status: DC | PRN
  Administered 2019-11-28: 50 mg via INTRAVENOUS

## 2019-11-28 MED ORDER — CYCLOBENZAPRINE HCL 10 MG PO TABS
10.0000 mg | ORAL_TABLET | Freq: Three times a day (TID) | ORAL | Status: DC | PRN
  Administered 2019-11-28: 10 mg via ORAL

## 2019-11-28 MED ORDER — VANCOMYCIN HCL 1500 MG/300ML IV SOLN
1500.0000 mg | Freq: Once | INTRAVENOUS | Status: AC
  Administered 2019-11-28: 1500 mg via INTRAVENOUS
  Filled 2019-11-28: qty 300

## 2019-11-28 MED ORDER — METOPROLOL TARTRATE 25 MG PO TABS
25.0000 mg | ORAL_TABLET | Freq: Every day | ORAL | Status: DC
  Administered 2019-11-29 – 2019-12-02 (×4): 25 mg via ORAL
  Filled 2019-11-28 (×4): qty 1

## 2019-11-28 MED ORDER — AMLODIPINE BESY-BENAZEPRIL HCL 10-40 MG PO CAPS: 1.0000 | ORAL_CAPSULE | Freq: Every day | ORAL | Status: DC

## 2019-11-28 MED ORDER — VANCOMYCIN HCL 1000 MG IV SOLR
INTRAVENOUS | Status: DC | PRN
  Administered 2019-11-28: 1000 mg via INTRAVENOUS

## 2019-11-28 MED ORDER — FENTANYL CITRATE (PF) 100 MCG/2ML IJ SOLN
25.0000 ug | INTRAMUSCULAR | Status: DC | PRN
  Administered 2019-11-28 (×2): 50 ug via INTRAVENOUS

## 2019-11-28 MED ORDER — MORPHINE SULFATE (PF) 4 MG/ML IV SOLN
4.0000 mg | INTRAVENOUS | Status: DC | PRN
  Administered 2019-11-28 – 2019-11-29 (×2): 4 mg via INTRAVENOUS
  Filled 2019-11-28 (×2): qty 1

## 2019-11-28 MED ORDER — CEFAZOLIN SODIUM-DEXTROSE 2-4 GM/100ML-% IV SOLN
2.0000 g | INTRAVENOUS | Status: AC
  Administered 2019-11-28: 2 g via INTRAVENOUS
  Filled 2019-11-28: qty 100

## 2019-11-28 MED ORDER — OXYCODONE HCL 5 MG PO TABS
ORAL_TABLET | ORAL | Status: AC
  Filled 2019-11-28: qty 3

## 2019-11-28 MED ORDER — ATORVASTATIN CALCIUM 40 MG PO TABS
40.0000 mg | ORAL_TABLET | Freq: Every day | ORAL | Status: DC
  Administered 2019-11-29 – 2019-12-02 (×4): 40 mg via ORAL
  Filled 2019-11-28 (×4): qty 1

## 2019-11-28 MED ORDER — BACITRACIN ZINC 500 UNIT/GM EX OINT
TOPICAL_OINTMENT | CUTANEOUS | Status: DC | PRN
  Administered 2019-11-28: 1 via TOPICAL

## 2019-11-28 MED ORDER — VANCOMYCIN HCL 1000 MG IV SOLR
INTRAVENOUS | Status: DC | PRN
  Administered 2019-11-28: 1000 mg via TOPICAL

## 2019-11-28 MED ORDER — FENTANYL CITRATE (PF) 250 MCG/5ML IJ SOLN
INTRAMUSCULAR | Status: AC
  Filled 2019-11-28: qty 5

## 2019-11-28 MED ORDER — FERROUS SULFATE 325 (65 FE) MG PO TABS
325.0000 mg | ORAL_TABLET | Freq: Two times a day (BID) | ORAL | Status: DC
  Administered 2019-11-29 – 2019-12-02 (×7): 325 mg via ORAL
  Filled 2019-11-28 (×7): qty 1

## 2019-11-28 MED ORDER — BUPIVACAINE-EPINEPHRINE 0.5% -1:200000 IJ SOLN
INTRAMUSCULAR | Status: AC
  Filled 2019-11-28: qty 1

## 2019-11-28 MED ORDER — CHLORHEXIDINE GLUCONATE CLOTH 2 % EX PADS: 6.0000 | MEDICATED_PAD | Freq: Once | CUTANEOUS | Status: DC

## 2019-11-28 MED ORDER — VANCOMYCIN HCL 1750 MG/350ML IV SOLN
1750.0000 mg | INTRAVENOUS | Status: DC
  Administered 2019-11-29 – 2019-12-01 (×3): 1750 mg via INTRAVENOUS
  Filled 2019-11-28 (×3): qty 350

## 2019-11-28 MED ORDER — ACETAMINOPHEN 650 MG RE SUPP: 650.0000 mg | RECTAL | Status: DC | PRN

## 2019-11-28 MED ORDER — MIDAZOLAM HCL 2 MG/2ML IJ SOLN
INTRAMUSCULAR | Status: AC
  Filled 2019-11-28: qty 2

## 2019-11-28 MED ORDER — DEXAMETHASONE SODIUM PHOSPHATE 10 MG/ML IJ SOLN
INTRAMUSCULAR | Status: AC
  Filled 2019-11-28: qty 1

## 2019-11-28 MED ORDER — LACTATED RINGERS IV SOLN: INTRAVENOUS | Status: DC

## 2019-11-28 MED ORDER — BACITRACIN ZINC 500 UNIT/GM EX OINT
TOPICAL_OINTMENT | CUTANEOUS | Status: AC
  Filled 2019-11-28: qty 28.35

## 2019-11-28 MED ORDER — SODIUM CHLORIDE 0.9% FLUSH: 3.0000 mL | INTRAVENOUS | Status: DC | PRN

## 2019-11-28 MED ORDER — FENTANYL CITRATE (PF) 100 MCG/2ML IJ SOLN
INTRAMUSCULAR | Status: DC | PRN
  Administered 2019-11-28: 100 ug via INTRAVENOUS
  Administered 2019-11-28: 50 ug via INTRAVENOUS

## 2019-11-28 MED ORDER — SODIUM CHLORIDE 0.9 % IV SOLN
250.0000 mL | INTRAVENOUS | Status: DC
  Administered 2019-11-28: 250 mL via INTRAVENOUS

## 2019-11-28 MED ORDER — THROMBIN 5000 UNITS EX SOLR
CUTANEOUS | Status: AC
  Filled 2019-11-28: qty 5000

## 2019-11-28 MED ORDER — OXYCODONE HCL 5 MG PO TABS
5.0000 mg | ORAL_TABLET | ORAL | Status: DC | PRN
  Administered 2019-11-28 – 2019-12-02 (×3): 5 mg via ORAL
  Filled 2019-11-28 (×2): qty 1

## 2019-11-28 MED ORDER — PHENYLEPHRINE HCL-NACL 10-0.9 MG/250ML-% IV SOLN
INTRAVENOUS | Status: DC | PRN
  Administered 2019-11-28: 50 ug/min via INTRAVENOUS

## 2019-11-28 MED ORDER — PANTOPRAZOLE SODIUM 40 MG PO TBEC
40.0000 mg | DELAYED_RELEASE_TABLET | Freq: Every day | ORAL | Status: DC
  Administered 2019-11-29 – 2019-12-02 (×4): 40 mg via ORAL
  Filled 2019-11-28 (×4): qty 1

## 2019-11-28 MED ORDER — ONDANSETRON HCL 4 MG PO TABS: 4.0000 mg | ORAL_TABLET | Freq: Four times a day (QID) | ORAL | Status: DC | PRN

## 2019-11-28 MED ORDER — CEFAZOLIN SODIUM-DEXTROSE 1-4 GM/50ML-% IV SOLN: 1.0000 g | Freq: Three times a day (TID) | INTRAVENOUS | Status: DC

## 2019-11-28 MED ORDER — SODIUM CHLORIDE 0.9% FLUSH
3.0000 mL | Freq: Two times a day (BID) | INTRAVENOUS | Status: DC
  Administered 2019-11-28 – 2019-12-01 (×3): 3 mL via INTRAVENOUS

## 2019-11-28 MED ORDER — PHENOL 1.4 % MT LIQD: 1.0000 | OROMUCOSAL | Status: DC | PRN

## 2019-11-28 MED ORDER — CYCLOBENZAPRINE HCL 10 MG PO TABS
ORAL_TABLET | ORAL | Status: AC
  Filled 2019-11-28: qty 1

## 2019-11-28 MED ORDER — HYDROCHLOROTHIAZIDE 25 MG PO TABS
25.0000 mg | ORAL_TABLET | Freq: Every day | ORAL | Status: DC
  Administered 2019-11-29 – 2019-12-02 (×4): 25 mg via ORAL
  Filled 2019-11-28 (×4): qty 1

## 2019-11-28 MED ORDER — SUGAMMADEX SODIUM 200 MG/2ML IV SOLN
INTRAVENOUS | Status: DC | PRN
  Administered 2019-11-28: 240 mg via INTRAVENOUS

## 2019-11-28 MED ORDER — ONDANSETRON HCL 4 MG/2ML IJ SOLN
INTRAMUSCULAR | Status: AC
  Filled 2019-11-28: qty 2

## 2019-11-28 MED ORDER — PROPOFOL 10 MG/ML IV BOLUS
INTRAVENOUS | Status: DC | PRN
  Administered 2019-11-28: 170 mg via INTRAVENOUS

## 2019-11-28 MED ORDER — ROCURONIUM BROMIDE 10 MG/ML (PF) SYRINGE
PREFILLED_SYRINGE | INTRAVENOUS | Status: AC
  Filled 2019-11-28: qty 10

## 2019-11-28 MED ORDER — LORATADINE 10 MG PO TABS
10.0000 mg | ORAL_TABLET | Freq: Every day | ORAL | Status: DC
  Administered 2019-11-29 – 2019-12-02 (×4): 10 mg via ORAL
  Filled 2019-11-28 (×4): qty 1

## 2019-11-28 MED ORDER — LIDOCAINE 2% (20 MG/ML) 5 ML SYRINGE
INTRAMUSCULAR | Status: AC
  Filled 2019-11-28: qty 5

## 2019-11-28 MED ORDER — FLUTICASONE PROPIONATE 50 MCG/ACT NA SUSP
2.0000 | Freq: Every day | NASAL | Status: DC
  Filled 2019-11-28: qty 16

## 2019-11-28 MED ORDER — BISACODYL 10 MG RE SUPP: 10.0000 mg | Freq: Every day | RECTAL | Status: DC | PRN

## 2019-11-28 MED ORDER — PHENYLEPHRINE 40 MCG/ML (10ML) SYRINGE FOR IV PUSH (FOR BLOOD PRESSURE SUPPORT)
PREFILLED_SYRINGE | INTRAVENOUS | Status: AC
  Filled 2019-11-28: qty 10

## 2019-11-28 MED ORDER — LIDOCAINE 2% (20 MG/ML) 5 ML SYRINGE
INTRAMUSCULAR | Status: DC | PRN
  Administered 2019-11-28: 100 mg via INTRAVENOUS

## 2019-11-28 MED ORDER — ONDANSETRON HCL 4 MG/2ML IJ SOLN: 4.0000 mg | Freq: Four times a day (QID) | INTRAMUSCULAR | Status: DC | PRN

## 2019-11-28 MED ORDER — TAMSULOSIN HCL 0.4 MG PO CAPS
0.8000 mg | ORAL_CAPSULE | Freq: Every day | ORAL | Status: DC
  Administered 2019-11-29 – 2019-12-02 (×4): 0.8 mg via ORAL
  Filled 2019-11-28 (×4): qty 2

## 2019-11-28 MED ORDER — ACETAMINOPHEN 500 MG PO TABS
1000.0000 mg | ORAL_TABLET | Freq: Four times a day (QID) | ORAL | Status: AC
  Administered 2019-11-28 – 2019-11-29 (×4): 1000 mg via ORAL
  Filled 2019-11-28 (×4): qty 2

## 2019-11-28 MED ORDER — PHENYLEPHRINE 40 MCG/ML (10ML) SYRINGE FOR IV PUSH (FOR BLOOD PRESSURE SUPPORT)
PREFILLED_SYRINGE | INTRAVENOUS | Status: DC | PRN
  Administered 2019-11-28 (×2): 80 ug via INTRAVENOUS
  Administered 2019-11-28: 120 ug via INTRAVENOUS

## 2019-11-28 MED ORDER — DEXAMETHASONE SODIUM PHOSPHATE 10 MG/ML IJ SOLN
INTRAMUSCULAR | Status: DC | PRN
  Administered 2019-11-28: 5 mg via INTRAVENOUS

## 2019-11-28 MED ORDER — MONTELUKAST SODIUM 10 MG PO TABS
10.0000 mg | ORAL_TABLET | Freq: Every day | ORAL | Status: DC
  Administered 2019-11-29 – 2019-12-02 (×4): 10 mg via ORAL
  Filled 2019-11-28 (×4): qty 1

## 2019-11-28 MED ORDER — ACETAMINOPHEN 325 MG PO TABS: 650.0000 mg | ORAL_TABLET | ORAL | Status: DC | PRN

## 2019-11-28 MED ORDER — GABAPENTIN 300 MG PO CAPS
600.0000 mg | ORAL_CAPSULE | Freq: Two times a day (BID) | ORAL | Status: DC
  Administered 2019-11-28 – 2019-12-02 (×8): 600 mg via ORAL
  Filled 2019-11-28 (×8): qty 2

## 2019-11-28 MED ORDER — VANCOMYCIN HCL IN DEXTROSE 1-5 GM/200ML-% IV SOLN
INTRAVENOUS | Status: AC
  Filled 2019-11-28: qty 200

## 2019-11-28 MED ORDER — BENAZEPRIL HCL 20 MG PO TABS
40.0000 mg | ORAL_TABLET | Freq: Every day | ORAL | Status: DC
  Administered 2019-11-29 – 2019-12-02 (×4): 40 mg via ORAL
  Filled 2019-11-28 (×4): qty 2

## 2019-11-28 MED ORDER — PROPOFOL 10 MG/ML IV BOLUS
INTRAVENOUS | Status: AC
  Filled 2019-11-28: qty 20

## 2019-11-28 MED ORDER — OXYCODONE HCL 5 MG PO TABS
10.0000 mg | ORAL_TABLET | ORAL | Status: DC | PRN
  Administered 2019-11-28 – 2019-12-01 (×9): 10 mg via ORAL
  Filled 2019-11-28 (×8): qty 2

## 2019-11-28 MED ORDER — DOCUSATE SODIUM 100 MG PO CAPS
100.0000 mg | ORAL_CAPSULE | Freq: Two times a day (BID) | ORAL | Status: DC
  Administered 2019-11-28 – 2019-12-02 (×8): 100 mg via ORAL
  Filled 2019-11-28 (×8): qty 1

## 2019-11-28 MED ORDER — SUCCINYLCHOLINE CHLORIDE 20 MG/ML IJ SOLN
INTRAMUSCULAR | Status: DC | PRN
  Administered 2019-11-28: 120 mg via INTRAVENOUS

## 2019-11-28 MED ORDER — 0.9 % SODIUM CHLORIDE (POUR BTL) OPTIME
TOPICAL | Status: DC | PRN
  Administered 2019-11-28: 1000 mL

## 2019-11-28 MED ORDER — ONDANSETRON HCL 4 MG/2ML IJ SOLN
INTRAMUSCULAR | Status: DC | PRN
  Administered 2019-11-28: 4 mg via INTRAVENOUS

## 2019-11-28 MED ORDER — SUGAMMADEX SODIUM 500 MG/5ML IV SOLN
INTRAVENOUS | Status: AC
  Filled 2019-11-28: qty 5

## 2019-11-28 MED ORDER — ONDANSETRON HCL 4 MG/2ML IJ SOLN: 4.0000 mg | Freq: Once | INTRAMUSCULAR | Status: DC | PRN

## 2019-11-28 MED ORDER — VANCOMYCIN HCL 1000 MG IV SOLR
INTRAVENOUS | Status: AC
  Filled 2019-11-28: qty 1000

## 2019-11-28 MED ORDER — AMLODIPINE BESYLATE 10 MG PO TABS
10.0000 mg | ORAL_TABLET | Freq: Every day | ORAL | Status: DC
  Administered 2019-11-28 – 2019-12-02 (×5): 10 mg via ORAL
  Filled 2019-11-28 (×5): qty 1

## 2019-11-28 SURGICAL SUPPLY — 40 items
APL SKNCLS STERI-STRIP NONHPOA (GAUZE/BANDAGES/DRESSINGS) ×1
BAG DECANTER FOR FLEXI CONT (MISCELLANEOUS) ×3 IMPLANT
BENZOIN TINCTURE PRP APPL 2/3 (GAUZE/BANDAGES/DRESSINGS) ×3 IMPLANT
BLADE CLIPPER SURG (BLADE) IMPLANT
CANISTER SUCT 3000ML PPV (MISCELLANEOUS) ×3 IMPLANT
CARTRIDGE OIL MAESTRO DRILL (MISCELLANEOUS) ×1 IMPLANT
CLOSURE WOUND 1/2 X4 (GAUZE/BANDAGES/DRESSINGS) ×1
DRAPE LAPAROTOMY 100X72X124 (DRAPES) ×3 IMPLANT
DRAPE POUCH INSTRU U-SHP 10X18 (DRAPES) ×1 IMPLANT
DRAPE SURG 17X23 STRL (DRAPES) ×12 IMPLANT
DRSG OPSITE 4X5.5 SM (GAUZE/BANDAGES/DRESSINGS) ×2 IMPLANT
DRSG OPSITE POSTOP 4X10 (GAUZE/BANDAGES/DRESSINGS) ×2 IMPLANT
ELECT REM PT RETURN 9FT ADLT (ELECTROSURGICAL) ×3
ELECTRODE REM PT RTRN 9FT ADLT (ELECTROSURGICAL) ×1 IMPLANT
GAUZE 4X4 16PLY RFD (DISPOSABLE) IMPLANT
GAUZE SPONGE 4X4 12PLY STRL (GAUZE/BANDAGES/DRESSINGS) ×3 IMPLANT
GLOVE BIO SURGEON STRL SZ8 (GLOVE) ×5 IMPLANT
GLOVE BIO SURGEON STRL SZ8.5 (GLOVE) ×5 IMPLANT
GLOVE EXAM NITRILE XL STR (GLOVE) IMPLANT
GOWN STRL REUS W/ TWL LRG LVL3 (GOWN DISPOSABLE) IMPLANT
GOWN STRL REUS W/ TWL XL LVL3 (GOWN DISPOSABLE) IMPLANT
GOWN STRL REUS W/TWL LRG LVL3 (GOWN DISPOSABLE) ×6
GOWN STRL REUS W/TWL XL LVL3 (GOWN DISPOSABLE) ×6
KIT BASIN OR (CUSTOM PROCEDURE TRAY) ×3 IMPLANT
KIT TURNOVER KIT B (KITS) ×3 IMPLANT
NEEDLE HYPO 22GX1.5 SAFETY (NEEDLE) IMPLANT
NS IRRIG 1000ML POUR BTL (IV SOLUTION) ×3 IMPLANT
OIL CARTRIDGE MAESTRO DRILL (MISCELLANEOUS)
PACK LAMINECTOMY NEURO (CUSTOM PROCEDURE TRAY) ×3 IMPLANT
PAD ARMBOARD 7.5X6 YLW CONV (MISCELLANEOUS) ×9 IMPLANT
STRIP CLOSURE SKIN 1/2X4 (GAUZE/BANDAGES/DRESSINGS) ×2 IMPLANT
SUT ETHILON 2 0 PSLX (SUTURE) ×2 IMPLANT
SUT VIC AB 1 CT1 18XBRD ANBCTR (SUTURE) ×1 IMPLANT
SUT VIC AB 1 CT1 8-18 (SUTURE) ×3
SUT VIC AB 2-0 CP2 18 (SUTURE) ×3 IMPLANT
SWAB COLLECTION DEVICE MRSA (MISCELLANEOUS) IMPLANT
SWAB CULTURE ESWAB REG 1ML (MISCELLANEOUS) IMPLANT
TOWEL GREEN STERILE (TOWEL DISPOSABLE) ×3 IMPLANT
TOWEL GREEN STERILE FF (TOWEL DISPOSABLE) ×3 IMPLANT
WATER STERILE IRR 1000ML POUR (IV SOLUTION) ×3 IMPLANT

## 2019-11-28 NOTE — Anesthesia Procedure Notes (Signed)
Procedure Name: Intubation Date/Time: 11/28/2019 4:25 PM Performed by: Barrington Ellison, CRNA Pre-anesthesia Checklist: Patient identified, Emergency Drugs available, Suction available and Patient being monitored Patient Re-evaluated:Patient Re-evaluated prior to induction Oxygen Delivery Method: Circle System Utilized Preoxygenation: Pre-oxygenation with 100% oxygen Induction Type: IV induction and Rapid sequence Laryngoscope Size: Glidescope and 4 Grade View: Grade I Tube type: Oral Tube size: 7.5 mm Number of attempts: 1 Airway Equipment and Method: Stylet,  Video-laryngoscopy and Oral airway Placement Confirmation: ETT inserted through vocal cords under direct vision,  positive ETCO2 and breath sounds checked- equal and bilateral Secured at: 22 cm Tube secured with: Tape Dental Injury: Teeth and Oropharynx as per pre-operative assessment

## 2019-11-28 NOTE — Anesthesia Postprocedure Evaluation (Signed)
Anesthesia Post Note  Patient: Justin Soto  Procedure(s) Performed: LUMBAR WOUND DEBRIDEMENT (N/A )     Patient location during evaluation: PACU Anesthesia Type: General Level of consciousness: awake and alert Pain management: pain level controlled Vital Signs Assessment: post-procedure vital signs reviewed and stable Respiratory status: spontaneous breathing, nonlabored ventilation, respiratory function stable and patient connected to nasal cannula oxygen Cardiovascular status: blood pressure returned to baseline and stable Postop Assessment: no apparent nausea or vomiting Anesthetic complications: no    Last Vitals:  Vitals:   11/28/19 1923 11/28/19 1938  BP: 131/71 130/74  Pulse: 90 90  Resp: (!) 24 20  Temp:    SpO2: 94% 95%    Last Pain:  Vitals:   11/28/19 1938  TempSrc:   PainSc: Asleep                 Catalina Gravel

## 2019-11-28 NOTE — Progress Notes (Signed)
Subjective: The patient is somnolent but easily arousable.  He is in no apparent distress.  He looks well.  Objective: Vital signs in last 24 hours: Temp:  [99.2 F (37.3 C)] 99.2 F (37.3 C) (12/17 1317) Pulse Rate:  [92] 92 (12/17 1317) Resp:  [18] 18 (12/17 1317) BP: (119)/(58) 119/58 (12/17 1317) SpO2:  [96 %] 96 % (12/17 1317) Weight:  [120 kg] 120 kg (12/17 1317) Estimated body mass index is 42.7 kg/m as calculated from the following:   Height as of this encounter: 5\' 6"  (1.676 m).   Weight as of this encounter: 120 kg.   Intake/Output from previous day: No intake/output data recorded. Intake/Output this shift: Total I/O In: 1050 [I.V.:800; IV Piggyback:250] Out: 20 [Blood:20]  Physical exam the patient is somnolent but arousable.  He is moving his lower extremities well.  Lab Results: Recent Labs    11/28/19 1338  WBC 6.6  HGB 8.1*  HCT 26.7*  PLT 336   BMET Recent Labs    11/28/19 1338  NA 139  K 3.3*  CL 103  CO2 24  GLUCOSE 108*  BUN 17  CREATININE 1.18  CALCIUM 9.1    Studies/Results: No results found.  Assessment/Plan: The patient is doing well.  I spoke with his wife.  I will ask ID to see the patient tomorrow.  We will await his blood cultures before placing a PICC line.  LOS: 0 days     Ophelia Charter 11/28/2019, 6:08 PM

## 2019-11-28 NOTE — Transfer of Care (Signed)
Immediate Anesthesia Transfer of Care Note  Patient: Justin Soto  Procedure(s) Performed: LUMBAR WOUND DEBRIDEMENT (N/A )  Patient Location: PACU  Anesthesia Type:General  Level of Consciousness: drowsy and patient cooperative  Airway & Oxygen Therapy: Patient Spontanous Breathing and Patient connected to face mask oxygen  Post-op Assessment: Report given to RN  Post vital signs: Reviewed and stable  Last Vitals:  Vitals Value Taken Time  BP 71/31 11/28/19 1759  Temp    Pulse 95 11/28/19 1801  Resp 22 11/28/19 1801  SpO2 94 % 11/28/19 1801  Vitals shown include unvalidated device data.  Last Pain:  Vitals:   11/28/19 1329  TempSrc:   PainSc: 7       Patients Stated Pain Goal: 3 ([id number])  Complications: No apparent anesthesia complications

## 2019-11-28 NOTE — Progress Notes (Signed)
Patient was positive for COVID 3 weeks ago, he will not need to be retested for COVID before his procedure today

## 2019-11-28 NOTE — Op Note (Signed)
Brief history: The patient is a 58 year old white male on whom I performed a lumbar decompression and fusion on 10/23/2019.  The patient initially did very well.  About 3 weeks after surgery he began having some drainage from his wound.  He was treated empirically with Keflex and doxycycline.  The drainage worsened.  I recommended surgery.  The patient has decided to proceed with an incision and drainage after weighing the risks, benefits and alternatives.  Pre-op diagnosis: Lumbar wound infection  Postop diagnosis: The same  Procedure: Incision and drainage of lumbar wound  Surgeon: Dr. Earle Gell  Assistant: Arnetha Massy nurse practitioner  Anesthesia: General tracheal  Estimated blood loss: 75 cc  Specimens: Wound cultures  Drains: 1 subcutaneous Hemovac drain  Complications: None  Description of procedure: The patient was brought to the operating room by the anesthesia team.  General endotracheal anesthesia was induced.  He was turned to the prone position on the [place] frame.  His lumbosacral region was then prepared with Betadine scrub and Betadine solution.  Sterile drapes were applied.  I then incised the patient's fresh surgical scar.  We encountered purulent material in the subcutaneous space.  We obtained cultures.  We remove the purulent thick material with suction and irrigation.  The infection did not appear to go beneath the fascia.  We irrigated the wound out with bacitracin solution.  We obtained hemostasis with bipolar electrocautery.  We placed a Hemovac drain in the subcutaneous space and tunneled it out through a separate stab wound.  We then reapproximated the patient's fatty layer with interrupted #1 Vicryl suture.  Reapproximated the cutaneous layer with interrupted 2-0 Vicryl suture.  We are reapproximated the skin with a running 2-0 nylon suture.  The wound was then coated with bacitracin ointment.  A sterile dressing was applied.  The drapes were removed.  The  patient was returned to the supine position.  By report all sponge, instrument, and needle counts were correct at the end of this case.

## 2019-11-28 NOTE — Progress Notes (Signed)
Pharmacy Antibiotic Note  Justin Soto is a 58 y.o. male admitted on 11/28/2019 with lumbar wound.  Pharmacy has been consulted for Vancomycin dosing.  S/p I + D, received 1 gram vancomycin at 1730 pm  Plan: Vancomycin 1500 mg iv x 1 now to complete 2500 mg iv load for today Vancomycin 1750 mg iv Q 24 hours (AUC of 525 using Scr 1.18)  Height: 5\' 6"  (167.6 cm) Weight: 264 lb 8.8 oz (120 kg) IBW/kg (Calculated) : 63.8  Temp (24hrs), Avg:98.5 F (36.9 C), Min:97.7 F (36.5 C), Max:99.2 F (37.3 C)  Recent Labs  Lab 11/28/19 1338  WBC 6.6  CREATININE 1.18    Estimated Creatinine Clearance: 83.3 mL/min (by C-G formula based on SCr of 1.18 mg/dL).    Allergies  Allergen Reactions  . Methocarbamol Other (See Comments)    Numbness, and change of taste   . Vicodin [Hydrocodone-Acetaminophen] Itching    Has to use benadryl  . Tizanidine Nausea Only    Thank you  Anette Guarneri, PharmD [phone]  11/28/2019 8:51 PM

## 2019-11-28 NOTE — Anesthesia Preprocedure Evaluation (Addendum)
Anesthesia Evaluation  Patient identified by MRN, date of birth, ID band Patient awake    Reviewed: Allergy & Precautions, NPO status , Patient's Chart, lab work & pertinent test results, reviewed documented beta blocker date and time   Airway Mallampati: II  TM Distance: >3 FB Neck ROM: Full    Dental  (+) Teeth Intact, Dental Advisory Given   Pulmonary sleep apnea and Continuous Positive Airway Pressure Ventilation ,    Pulmonary exam normal breath sounds clear to auscultation       Cardiovascular hypertension, Pt. on medications and Pt. on home beta blockers Normal cardiovascular exam Rhythm:Regular Rate:Normal     Neuro/Psych PSYCHIATRIC DISORDERS Depression WOUND INFECTION, LUMBAR    GI/Hepatic Neg liver ROS, GERD  Medicated,  Endo/Other  Morbid obesity  Renal/GU negative Renal ROS     Musculoskeletal  (+) Arthritis ,   Abdominal   Peds  Hematology  (+) Blood dyscrasia, anemia ,   Anesthesia Other Findings Day of surgery medications reviewed with the patient.  Reproductive/Obstetrics                           Anesthesia Physical Anesthesia Plan  ASA: III  Anesthesia Plan: General   Post-op Pain Management:    Induction: Intravenous  PONV Risk Score and Plan: 2  Airway Management Planned: Oral ETT and Video Laryngoscope Planned  Additional Equipment:   Intra-op Plan:   Post-operative Plan: Extubation in OR  Informed Consent: I have reviewed the patients History and Physical, chart, labs and discussed the procedure including the risks, benefits and alternatives for the proposed anesthesia with the patient or authorized representative who has indicated his/her understanding and acceptance.     Dental advisory given  Plan Discussed with: CRNA  Anesthesia Plan Comments:        Anesthesia Quick Evaluation

## 2019-11-28 NOTE — H&P (Signed)
Subjective: The patient is a 58 year old obese white male on whom I performed a lumbar fusion proximally a month ago.  He has had excellent resolution of his radicular symptoms, but he developed some drainage of his wound.  He has a history of prior MRSA infections and positive nasal culture.  I treated him with Keflex and doxycycline but the drainage has persisted and worsened.  He is admitted for an incision and drainage of his lumbar wound.  Past Medical History:  Diagnosis Date  . Anemia   . Arthritis   . Dyspnea   . Enlarged prostate    hx of  . Environmental and seasonal allergies   . Frequency of urination   . GERD (gastroesophageal reflux disease)   . History of kidney stones   . HOH (hard of hearing)    wears bilateral hearing aids  . Hypertension   . Irregular heart beat    Pt feels this every once in a while; PCP aware but stated it was OK  . Kidney stone on right side 01/15/2017  . Leg pain, right    Take Lyrica and Naproxen  . Neck pain of over 3 months duration   . Sleep apnea   . Thalassemia   . Tick fever     Past Surgical History:  Procedure Laterality Date  . ANTERIOR CERVICAL DECOMP/DISCECTOMY FUSION N/A 11/13/2014   Procedure: Cervical three-four, Cervical six-seven anterior cervical decompression with fusion interbody prosthesis plating and bonegraft;  Surgeon: Newman Pies, MD;  Location: [facility];  Service: Neurosurgery;  Laterality: N/A;  Cervical three-four, Cervical six-seven anterior cervical decompression with fusion interbody prosthesis plating and bonegraft  . BACK SURGERY     Lumbar X 2  . CALCANEAL OSTEOTOMY Right 01/11/2017   Procedure: CALCANEAL OSTEOTOMY;  Surgeon: Earnestine Leys, MD;  Location: [facility];  Service: Orthopedics;  Laterality: Right;  . CIRCUMCISION N/A 12/13/2017   Procedure: PENILE BLOCK, LYSIS OF PENILE ADHESIONS;  Surgeon: [place]ice Espy, MD;  Location: [facility];  Service: Urology;  Laterality: N/A;  . COLONOSCOPY    . CYSTOSCOPY  N/A 12/13/2017   Procedure: CYSTOSCOPY;  Surgeon: Hollice Espy, MD;  Location: [facility];  Service: Urology;  Laterality: N/A;  . HERNIA REPAIR Left    groin  . LEG SURGERY Right    Had surgery on right calf X 5  . LITHOTRIPSY    . QUADRICEPS TENDON REPAIR Left 06/22/2015   Procedure: REPAIR QUADRICEP TENDON;  Surgeon: Earnestine Leys, MD;  Location: [facility];  Service: Orthopedics;  Laterality: Left;  Marland Kitchen QUADRICEPS TENDON REPAIR Left 08/12/2015   Procedure: REPAIR QUADRICEP TENDON;  Surgeon: Earnestine Leys, MD;  Location: [facility];  Service: Orthopedics;  Laterality: Left;  . ROTATOR CUFF REPAIR Left   . TONSILLECTOMY    . VASECTOMY     with hydrocele repaired    Allergies  Allergen Reactions  . Methocarbamol Other (See Comments)    Numbness, and change of taste   . Vicodin [Hydrocodone-Acetaminophen] Itching    Has to use benadryl  . Tizanidine Nausea Only    Social History   Tobacco Use  . Smoking status: Never Smoker  . Smokeless tobacco: Never Used  Substance Use Topics  . Alcohol use: No    Family History  Problem Relation Age of Onset  . Prostate cancer Father   . Heart disease Father   . Diabetes Father   . Alzheimer's disease Father   . Anemia Mother   . Osteoporosis Mother   .  Diabetes Brother   . Hypertension Brother   . Anemia Daughter   . Kidney disease Neg Hx   . Bladder Cancer Neg Hx    Prior to Admission medications   Medication Sig Start Date End Date Taking? Authorizing Provider  amLODipine-benazepril (LOTREL) 10-40 MG capsule TAKE 1 CAPSULE BY MOUTH EVERY DAY Patient taking differently: Take 1 capsule by mouth daily.  01/30/19  Yes Jerrol Banana., MD  aspirin EC 81 MG tablet Take 81 mg by mouth daily.   Yes [provider]  atorvastatin (LIPITOR) 40 MG tablet Take 40 mg by mouth daily. 08/06/19  Yes [provider]  cetirizine (ZYRTEC) 10 MG tablet TAKE ONE TO TWO TABLETS BY MOUTH EVERY DAY AS NEEDED. Patient taking  differently: Take 10 mg by mouth daily.  02/22/19  Yes Jerrol Banana., MD  cyclobenzaprine (FLEXERIL) 10 MG tablet Take 1 tablet (10 mg total) by mouth 3 (three) times daily as needed for muscle spasms. 10/25/19  Yes Newman Pies, MD  docusate sodium (COLACE) 100 MG capsule Take 1 capsule (100 mg total) by mouth 2 (two) times daily. 10/25/19  Yes Newman Pies, MD  ferrous sulfate 325 (65 FE) MG tablet Take 1 tablet (325 mg total) by mouth 2 (two) times daily with a meal. 10/25/19  Yes Newman Pies, MD  finasteride (PROSCAR) 5 MG tablet Take 1 tablet (5 mg total) by mouth daily. 04/09/19  Yes McGowan, [name] A, PA-C  fluticasone (FLONASE) 50 MCG/ACT nasal spray USE TWO SPRAYS IN EACH NOSTRIL EVERY DAY Patient taking differently: Place 2 sprays into both nostrils daily.  09/20/18  Yes Jerrol Banana., MD  gabapentin (NEURONTIN) 300 MG capsule Take 600 mg by mouth 2 (two) times daily.   Yes [provider]  hydrochlorothiazide (HYDRODIURIL) 25 MG tablet TAKE ONE TABLET BY MOUTH EVERY DAY Patient taking differently: Take 25 mg by mouth daily.  12/17/18  Yes Jerrol Banana., MD  metoprolol tartrate (LOPRESSOR) 25 MG tablet TAKE 1 TABLET BY MOUTH EVERY DAY Patient taking differently: Take 25 mg by mouth daily.  05/18/19  Yes Jerrol Banana., MD  montelukast (SINGULAIR) 10 MG tablet TAKE ONE TABLET BY MOUTH EVERY MORNING Patient taking differently: Take 10 mg by mouth daily.  12/17/18  Yes Jerrol Banana., MD  Multiple Vitamin (MULTIVITAMIN WITH MINERALS) TABS tablet Take 1 tablet by mouth daily.   Yes [provider]  omeprazole (PRILOSEC) 10 MG capsule Take 10 mg by mouth daily.   Yes [provider]  tamsulosin (FLOMAX) 0.4 MG CAPS capsule TAKE TWO CAPSULES EACH DAY AS DIRECTED Patient taking differently: Take 0.8 mg by mouth daily.  04/09/19  Yes McGowan, [name] A, PA-C  gabapentin (NEURONTIN) 100 MG capsule Take 1 capsule (100 mg  total) by mouth 3 (three) times daily. 06/21/19   Mar Daring, PA-C     Review of Systems  Positive ROS: As above  All other systems have been reviewed and were otherwise negative with the exception of those mentioned in the HPI and as above.  Objective: Vital signs in last 24 hours: Temp:  [99.2 F (37.3 C)] 99.2 F (37.3 C) (12/17 1317) Pulse Rate:  [92] 92 (12/17 1317) Resp:  [18] 18 (12/17 1317) BP: (119)/(58) 119/58 (12/17 1317) SpO2:  [96 %] 96 % (12/17 1317) Weight:  [120 kg] 120 kg (12/17 1317) Estimated body mass index is 42.7 kg/m as calculated from the following:   Height  as of this encounter: 5\' 6"  (1.676 m).   Weight as of this encounter: 120 kg.   Physical exam:  General: An obese and pleasant 58 year old white male  HEENT: Normocephalic, atraumatic  Neck: His anterior cervical incision is well-healed.  Thorax: Symmetric  Abdomen: Obese  Extremities: Unremarkable  Neurologic exam the patient is alert and oriented x3.  His lower extremity strength is grossly normal.  Lumbar wound: He has purulent drainage.   Data Review Lab Results  Component Value Date   WBC 6.6 11/28/2019   HGB 8.1 (L) 11/28/2019   HCT 26.7 (L) 11/28/2019   MCV 63.7 (L) 11/28/2019   PLT 336 11/28/2019   Lab Results  Component Value Date   NA 139 11/28/2019   K 3.3 (L) 11/28/2019   CL 103 11/28/2019   CO2 24 11/28/2019   BUN 17 11/28/2019   CREATININE 1.18 11/28/2019   GLUCOSE 108 (H) 11/28/2019   Lab Results  Component Value Date   INR 1.0 08/11/2014    Assessment/Plan: Lumbar wound infection: I have discussed the situation with the patient.  I recommended an incision and drainage of his wound to obtain cultures.  He will likely need a PICC line and IV antibiotics.   Ophelia Charter 11/28/2019 2:32 PM

## 2019-11-29 ENCOUNTER — Other Ambulatory Visit: Payer: Self-pay | Admitting: Family Medicine

## 2019-11-29 DIAGNOSIS — Z981 Arthrodesis status: Secondary | ICD-10-CM

## 2019-11-29 DIAGNOSIS — I10 Essential (primary) hypertension: Secondary | ICD-10-CM

## 2019-11-29 DIAGNOSIS — K8020 Calculus of gallbladder without cholecystitis without obstruction: Secondary | ICD-10-CM

## 2019-11-29 DIAGNOSIS — Z885 Allergy status to narcotic agent status: Secondary | ICD-10-CM

## 2019-11-29 DIAGNOSIS — Z8614 Personal history of Methicillin resistant Staphylococcus aureus infection: Secondary | ICD-10-CM

## 2019-11-29 DIAGNOSIS — T8149XA Infection following a procedure, other surgical site, initial encounter: Secondary | ICD-10-CM

## 2019-11-29 DIAGNOSIS — H9190 Unspecified hearing loss, unspecified ear: Secondary | ICD-10-CM

## 2019-11-29 DIAGNOSIS — Z888 Allergy status to other drugs, medicaments and biological substances status: Secondary | ICD-10-CM

## 2019-11-29 LAB — C-REACTIVE PROTEIN: CRP: 2.1 mg/dL — ABNORMAL HIGH (ref <1.0)

## 2019-11-29 LAB — SEDIMENTATION RATE: Sed Rate: 72 mm/hr — ABNORMAL HIGH (ref 0–16)

## 2019-11-29 MED ORDER — CEFAZOLIN SODIUM-DEXTROSE 2-4 GM/100ML-% IV SOLN
2.0000 g | Freq: Three times a day (TID) | INTRAVENOUS | Status: DC
  Administered 2019-11-29 – 2019-12-01 (×6): 2 g via INTRAVENOUS
  Filled 2019-11-29 (×6): qty 100

## 2019-11-29 NOTE — Progress Notes (Signed)
Subjective: The patient is alert and pleasant.  He looks well.  He is in no apparent distress.  Objective: Vital signs in last 24 hours: Temp:  [97.7 F (36.5 C)-99.4 F (37.4 C)] 98.5 F (36.9 C) (12/18 0359) Pulse Rate:  [84-95] 84 (12/18 0359) Resp:  [17-24] 19 (12/18 0359) BP: (108-136)/(58-83) 131/78 (12/18 0359) SpO2:  [92 %-97 %] 96 % (12/18 0359) Weight:  [120 kg] 120 kg (12/17 1317) Estimated body mass index is 42.7 kg/m as calculated from the following:   Height as of this encounter: 5\' 6"  (1.676 m).   Weight as of this encounter: 120 kg.   Intake/Output from previous day: 12/17 0701 - 12/18 0700 In: 2306.6 [P.O.:500; I.V.:1256.6; IV Piggyback:550] Out: 1020 [Urine:1000; Blood:20] Intake/Output this shift: No intake/output data recorded.  Physical exam the patient is alert and pleasant.  His lower extremity strength is normal.  Lab Results: Recent Labs    11/28/19 1338  WBC 6.6  HGB 8.1*  HCT 26.7*  PLT 336   BMET Recent Labs    11/28/19 1338  NA 139  K 3.3*  CL 103  CO2 24  GLUCOSE 108*  BUN 17  CREATININE 1.18  CALCIUM 9.1    Studies/Results: No results found.  Assessment/Plan: Postop day #1: The patient is doing well.  We are awaiting his intraoperative cultures.  I will ask ID to see the patient.  He will likely need a PICC line when his blood cultures are negative and IV antibiotics.  I discussed this with him and answered all his questions.  LOS: 1 day     Ophelia Charter 11/29/2019, 8:01 AM

## 2019-11-29 NOTE — Evaluation (Signed)
Occupational Therapy Evaluation Patient Details Name: Justin Soto MRN: [id number] DOB: 03/12/1961 Today's Date: 11/29/2019    History of Present Illness Justin Soto is a 58 y.o. male with previous medical history significant for hypertension, hard of hearing, cholelithiasis, sleep apnea, and recent posterior lumbar interbody fusion and prosthesis with instrumentation implantation on 10/23/2019 admitted with worsening drainage of his surgical wound. Mr. Entrikin was taken to the OR on 12/17 for incision and drainage of the lumbar wound.   Clinical Impression   Pt is at min A  level with LB ADLs, mig guard A - sup with ADL mobility using RW. Pt able to log roll to sit EOB Mod I. Pt had previous back surgery (PLIF) 11/01/19, is familiar with back precautions and has all necessary DME and A/E at home. Pt lives at home with his wife and will have assist as needed from her in the evenings when she is off work. All education completed and no further acute OT is indicated at this time, OT will sign off    Follow Up Recommendations  No OT follow up;Supervision - Intermittent    Equipment Recommendations  None recommended by OT    Recommendations for Other Services       Precautions / Restrictions Precautions Precautions: Back Precaution Comments: reviewed back preacutions with pt Restrictions Weight Bearing Restrictions: No      Mobility Bed Mobility Overal bed mobility: Needs Assistance Bed Mobility: Sidelying to Sit;Sit to Sidelying   Sidelying to sit: Modified independent (Device/Increase time)     Sit to sidelying: Modified independent (Device/Increase time)    Transfers Overall transfer level: Needs assistance Equipment used: Rolling walker (2 wheeled) Transfers: Sit to/from [facility] Sit to Stand: Min guard Stand pivot transfers: Supervision            Balance Overall balance assessment: Mild deficits observed, not formally tested                                          ADL either performed or assessed with clinical judgement   ADL Overall ADL's : Needs assistance/impaired Eating/Feeding: Independent;Sitting   Grooming: Wash/dry hands;Wash/dry face;Supervision/safety;Standing   Upper Body Bathing: Set up;Sitting   Lower Body Bathing: Minimal assistance   Upper Body Dressing : Set up;Sitting   Lower Body Dressing: Minimal assistance   Toilet Transfer: Supervision/safety;Ambulation;RW   Toileting- Water quality scientist and Hygiene: Min guard;Sit to/from stand   Tub/ Shower Transfer: Supervision/safety;Ambulation;Rolling walker;3 in 1   Functional mobility during ADLs: Min guard;Supervision/safety;Rolling walker       Vision Patient Visual Report: No change from baseline       Perception     Praxis      Pertinent Vitals/Pain Pain Assessment: No/denies pain     Hand Dominance Right   Extremity/Trunk Assessment Upper Extremity Assessment Upper Extremity Assessment: Overall WFL for tasks assessed   Lower Extremity Assessment Lower Extremity Assessment: Defer to PT evaluation   Cervical / Trunk Assessment Cervical / Trunk Assessment: Other exceptions Cervical / Trunk Exceptions: previous lumbar surgery 10/23/2019 (PLIF and prosthesis)   Communication Communication Communication: No difficulties   Cognition Arousal/Alertness: Awake/alert Behavior During Therapy: WFL for tasks assessed/performed Overall Cognitive Status: Within Functional Limits for tasks assessed  General Comments       Exercises     Shoulder Instructions      Home Living Family/patient expects to be discharged to:: Private residence Living Arrangements: Spouse/significant other Available Help at Discharge: Family;Available 24 hours/day Type of Home: House Home Access: Stairs to enter [employer] of Steps: 5 Entrance Stairs-Rails: None Home  Layout: Two level;Able to live on main level with bedroom/bathroom Alternate Level Stairs-Number of Steps: Flight   Bathroom Shower/Tub: Walk-in shower;Tub/shower unit   Bathroom Toilet: Handicapped height     Home Equipment: Environmental consultant - 2 wheels;Bedside commode;Shower seat;Adaptive equipment;Other (comment)(rollater) Adaptive Equipment: Reacher        Prior Functioning/Environment Level of Independence: Independent with assistive device(s)  Gait / Transfers Assistance Needed: used rollater ADL's / Homemaking Assistance Needed: independent with ADLs/selfcare            OT Problem List: Impaired balance (sitting and/or standing);Decreased activity tolerance      OT Treatment/Interventions:      OT Goals(Current goals can be found in the care plan section) Acute Rehab OT Goals Patient Stated Goal: go home OT Goal Formulation: With patient  OT Frequency:     Barriers to D/C:    no barriers       Co-evaluation              AM-PAC OT "6 Clicks" Daily Activity     Outcome Measure Help from another person eating meals?: None Help from another person taking care of personal grooming?: None Help from another person toileting, which includes using toliet, bedpan, or urinal?: A Little Help from another person bathing (including washing, rinsing, drying)?: A Little Help from another person to put on and taking off regular upper body clothing?: None Help from another person to put on and taking off regular lower body clothing?: A Little 6 Click Score: 21   End of Session Equipment Utilized During Treatment: Rolling walker;Back brace;Other (comment)(3 in 1)  Activity Tolerance: Patient tolerated treatment well Patient left: in bed;with call bell/phone within reach  OT Visit Diagnosis: Unsteadiness on feet (R26.81)                Time: 1414-1440 OT Time Calculation (min): 26 min Charges:  OT General Charges $OT Visit: 1 Visit OT Evaluation $OT Eval Low Complexity: 1  Low OT Treatments $Self Care/Home Management : 8-22 mins    Britt Bottom 11/29/2019, 2:50 PM

## 2019-11-29 NOTE — Consult Note (Signed)
[facility] for Infectious Disease    Date of Admission:  11/28/2019     Total days of antibiotics 1               Reason for Consult: Postsurgical wound infection Referring Provider: Arnoldo Morale Primary Care Provider: Jerrol Banana., MD   ASSESSMENT:  Justin Soto is a 58 y/o male with post-surgical wound infection refractory to oral antibiotics and s/p irrigation and debridement with hemovac placement. Spoke with neurosurgery who has no concern for hardware infection as there was no tunneling. Surgical specimen gram stain with no organisms seen and cultures pending. Unsure if we will be able to procure an organism as he was on Keflex and doxycycline prior to admission. 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 Will continue Vancomycin and add Cefazolin pending culture results. May consider discharge on oral antibiotics as this was a superficial infection. From healthcare maintenance perspective there is no evidence of Hepatitis C testing and will obtain Hepatitis C antibody.   PLAN:  1. Continue vancomycin. 2. Add Cefazolin 3. Therapeutic drug monitoring for renal function while on vancomycin.  4. Wound care per neurosurgery. 5. Hepatitis C antibody for routine screening.    Active Problems:   Postoperative wound infection   . amLODipine  10 mg Oral Daily   And  . benazepril  40 mg Oral Daily  . atorvastatin  40 mg Oral Daily  . docusate sodium  100 mg Oral BID  . ferrous sulfate  325 mg Oral BID WC  . finasteride  5 mg Oral Daily  . fluticasone  2 spray Each Nare Daily  . gabapentin  600 mg Oral BID  . hydrochlorothiazide  25 mg Oral Daily  . loratadine  10 mg Oral Daily  . metoprolol tartrate  25 mg Oral Daily  . montelukast  10 mg Oral Daily  . pantoprazole  40 mg Oral Daily  . sodium chloride flush  3 mL Intravenous Q12H  . tamsulosin  0.8 mg Oral Daily     HPI: Justin Soto is a 58 y.o. male with previous medical history significant for hypertension,  hard of hearing, cholelithiasis, sleep apnea, and recent posterior lumbar interbody fusion and prosthesis with instrumentation implantation on 10/23/2019 admitted with worsening drainage of his surgical wound over the past week.  Symptoms were refractory to oral Keflex and doxycycline from his recent neurosurgery office visit.  Justin Soto was taken to the OR on 12/17 for incision and drainage of the lumbar wound. Purulent fluid was encountered in the subcutaneous space and did not appear to go beneath the fascia. Surgical specimens obtained with gram stain showing no organisms and cultures remain pending.  Justin Soto has been afebrile since admission with no leukocytosis. Current antimicrobial therapy is vancomycin. ID has been consulted for treatment recommendations.    Review of Systems: Review of Systems  Constitutional: Negative for chills, fever and weight loss.  Respiratory: Negative for cough, shortness of breath and wheezing.   Cardiovascular: Negative for chest pain and leg swelling.  Gastrointestinal: Negative for abdominal pain, constipation, diarrhea, nausea and vomiting.  Musculoskeletal: Positive for back pain.  Skin: Negative for rash.     Past Medical History:  Diagnosis Date  . Anemia   . Arthritis   . Dyspnea   . Enlarged prostate    hx of  . Environmental and seasonal allergies   . Frequency of urination   . GERD (gastroesophageal reflux disease)   . History  of kidney stones   . HOH (hard of hearing)    wears bilateral hearing aids  . Hypertension   . Irregular heart beat    Pt feels this every once in a while; PCP aware but stated it was OK  . Kidney stone on right side 01/15/2017  . Leg pain, right    Take Lyrica and Naproxen  . Neck pain of over 3 months duration   . Sleep apnea   . Thalassemia   . Tick fever     Social History   Tobacco Use  . Smoking status: Never Smoker  . Smokeless tobacco: Never Used  Substance Use Topics  . Alcohol use: No  .  Drug use: No    Family History  Problem Relation Age of Onset  . Prostate cancer Father   . Heart disease Father   . Diabetes Father   . Alzheimer's disease Father   . Anemia Mother   . Osteoporosis Mother   . Diabetes Brother   . Hypertension Brother   . Anemia Daughter   . Kidney disease Neg Hx   . Bladder Cancer Neg Hx     Allergies  Allergen Reactions  . Methocarbamol Other (See Comments)    Numbness, and change of taste   . Vicodin [Hydrocodone-Acetaminophen] Itching    Has to use benadryl  . Tizanidine Nausea Only    OBJECTIVE: Blood pressure 119/64, pulse 80, temperature 97.9 F (36.6 C), temperature source Oral, resp. rate 17, height 5\' 6"  (1.676 m), weight 120 kg, SpO2 95 %.  Physical Exam Constitutional:      General: He is not in acute distress.    Appearance: He is well-developed.  Cardiovascular:     Rate and Rhythm: Normal rate and regular rhythm.     Heart sounds: Normal heart sounds.  Pulmonary:     Effort: Pulmonary effort is normal.     Breath sounds: Normal breath sounds.  Skin:    General: Skin is warm and dry.  Neurological:     Mental Status: He is alert and oriented to person, place, and time.  Psychiatric:        Behavior: Behavior normal.        Thought Content: Thought content normal.        Judgment: Judgment normal.     Lab Results Lab Results  Component Value Date   WBC 6.6 11/28/2019   HGB 8.1 (L) 11/28/2019   HCT 26.7 (L) 11/28/2019   MCV 63.7 (L) 11/28/2019   PLT 336 11/28/2019    Lab Results  Component Value Date   CREATININE 1.18 11/28/2019   BUN 17 11/28/2019   NA 139 11/28/2019   K 3.3 (L) 11/28/2019   CL 103 11/28/2019   CO2 24 11/28/2019    Lab Results  Component Value Date   ALT 28 08/10/2017   AST 30 08/10/2017   ALKPHOS 50 08/10/2017   BILITOT 0.6 08/10/2017     Microbiology: Recent Results (from the past 240 hour(s))  Aerobic/Anaerobic Culture (surgical/deep wound)     Status: None (Preliminary  result)   Collection Time: 11/28/19  5:01 PM   Specimen: Wound  Result Value Ref Range Status   Specimen Description WOUND LUMBAR  Final   Special Requests NONE  Final   Gram Stain   Final    ABUNDANT WBC PRESENT, PREDOMINANTLY PMN NO ORGANISMS SEEN Performed at [facility], [address] [place], [place] [address]    Culture PENDING  Incomplete   Report Status PENDING  Incomplete     Terri Piedra, NP [facility] [place] [phone] Pager  11/29/2019  12:03 PM

## 2019-11-29 NOTE — Progress Notes (Signed)
RN spoke with [place] with infection prevention about pt's COVID positive COVID test 4 weeks ago. She said that pt does not need to be on any airborne precautions due to test being past 21 days and pt is asymptomatic. Repeat test does not need to be done and pt is okay to ambulate in hallway as long as he is wearing his mask.   RN also spoke with Dr. Arnoldo Morale and pt is okay not to wear his back brace. Can wear for comfort.

## 2019-11-29 NOTE — Plan of Care (Addendum)
  Problem: Skin Integrity: Goal: Risk for impaired skin integrity will decrease Outcome: Progressing   Problem: Safety: Goal: Ability to remain free from injury will improve Outcome: Progressing   Problem: Pain Managment: Goal: General experience of comfort will improve Outcome: Progressing Updated CN with concern of note the Pt had positive COVID test 3 weeks ago per RN note on 11/28/2019 at 11:21 AM.   Patient placed on Special  Airborne/Contact Precautions per [facility] instructions.  Staff made aware and updated per Special Contact Concerns. Pt displaying no s/s of resp distress, cough, or fever since arrival from PACU.   Ortho tech to fit ASPEN BRACE this morning between at 5 AM. Pt will only be allowed to ambulate in room until status clarification regarding retesting or not for COVID.   Charge nurse given copy of note mentioned above and relayed this information to the [facility].

## 2019-11-29 NOTE — Evaluation (Signed)
Physical Therapy Evaluation Patient Details Name: Justin Soto MRN: [id number] DOB: 03/17/1961 Today's Date: 11/29/2019   History of Present Illness  Pt is a 58 y/o male s/p lumbar wound I&D. Pt with prior lumbar fusion surgery on 10/23/19. Additional PMH including but not limited to HTN and ACDF in 2015.    Clinical Impression  Pt presented supine in bed with HOB elevated, awake and willing to participate in therapy session. Prior to admission, pt reported that he had been ambulating with a rollator and was independent with ADLs. Pt lives with his spouse in a two level home with several steps to enter. He will have assistance from his spouse intermittently upon d/c. At the time of evaluation, pt overall moving very well. He was at a supervision level for all functional mobility including hallway ambulation with RW. PT reviewed 3/3 back precautions and log roll technique with pt to use for comfort. PT will continue to follow pt acutely to progress mobility as tolerated.     Follow Up Recommendations No PT follow up    Equipment Recommendations  None recommended by PT    Recommendations for Other Services       Precautions / Restrictions Precautions Precautions: Back Precaution Comments: reviewed 3/3 back precautions with pt Required Braces or Orthoses: Spinal Brace Spinal Brace: Applied in sitting position;Lumbar corset Restrictions Weight Bearing Restrictions: No      Mobility  Bed Mobility Overal bed mobility: Needs Assistance Bed Mobility: Rolling;Sidelying to Sit;Sit to Sidelying Rolling: Supervision Sidelying to sit: Supervision     Sit to sidelying: Supervision General bed mobility comments: for safety; cueing for log roll technique  Transfers Overall transfer level: Needs assistance Equipment used: Rolling walker (2 wheeled) Transfers: Sit to/from Stand Sit to Stand: Min guard Stand pivot transfers: Supervision       General transfer comment: steady with  transition  Ambulation/Gait Ambulation/Gait assistance: Supervision Gait Distance (Feet): 500 Feet Assistive device: Rolling walker (2 wheeled) Gait Pattern/deviations: Step-through pattern;Decreased stride length Gait velocity: decreased   General Gait Details: pt overall steady with RW, no LOB or need for physical assistance, min guard for safety  Stairs            Wheelchair Mobility    Modified Rankin (Stroke Patients Only)       Balance Overall balance assessment: Mild deficits observed, not formally tested                                           Pertinent Vitals/Pain Pain Assessment: Faces Faces Pain Scale: Hurts a little bit Pain Location: back Pain Descriptors / Indicators: Sore Pain Intervention(s): Monitored during session;Repositioned    Home Living Family/patient expects to be discharged to:: Private residence Living Arrangements: Spouse/significant other Available Help at Discharge: Family Type of Home: House Home Access: Stairs to enter Entrance Stairs-Rails: None Entrance Stairs-Number of Steps: 5 Home Layout: Two level;Able to live on main level with bedroom/bathroom Home Equipment: Gilford Rile - 2 wheels;Walker - 4 wheels;Bedside commode;Shower seat      Prior Function Level of Independence: Independent with assistive device(s)   Gait / Transfers Assistance Needed: ambulates with a rollator  ADL's / Homemaking Assistance Needed: independent with ADLs/selfcare        Hand Dominance   Dominant Hand: Right    Extremity/Trunk Assessment   Upper Extremity Assessment Upper Extremity Assessment: Overall WFL for tasks assessed  Lower Extremity Assessment Lower Extremity Assessment: Overall WFL for tasks assessed    Cervical / Trunk Assessment Cervical / Trunk Assessment: Other exceptions Cervical / Trunk Exceptions: previous lumbar surgery 10/23/2019 (PLIF and prosthesis)  Communication   Communication: No  difficulties  Cognition Arousal/Alertness: Awake/alert Behavior During Therapy: WFL for tasks assessed/performed Overall Cognitive Status: Within Functional Limits for tasks assessed                                        General Comments      Exercises     Assessment/Plan    PT Assessment Patient needs continued PT services  PT Problem List Decreased balance;Decreased mobility;Decreased coordination;Decreased knowledge of use of DME;Decreased safety awareness;Decreased knowledge of precautions;Pain       PT Treatment Interventions DME instruction;Gait training;Stair training;Functional mobility training;Therapeutic activities;Therapeutic exercise;Balance training;Neuromuscular re-education;Patient/family education    PT Goals (Current goals can be found in the Care Plan section)  Acute Rehab PT Goals Patient Stated Goal: to return home PT Goal Formulation: With patient Time For Goal Achievement: 12/13/19 Potential to Achieve Goals: Good    Frequency Min 3X/week   Barriers to discharge        Co-evaluation               AM-PAC PT "6 Clicks" Mobility  Outcome Measure Help needed turning from your back to your side while in a flat bed without using bedrails?: None Help needed moving from lying on your back to sitting on the side of a flat bed without using bedrails?: None Help needed moving to and from a bed to a chair (including a wheelchair)?: None Help needed standing up from a chair using your arms (e.g., wheelchair or bedside chair)?: None Help needed to walk in hospital room?: None Help needed climbing 3-5 steps with a railing? : A Little 6 Click Score: 23    End of Session Equipment Utilized During Treatment: Back brace Activity Tolerance: Patient tolerated treatment well Patient left: in bed;with call bell/phone within reach Nurse Communication: Mobility status PT Visit Diagnosis: Other abnormalities of gait and mobility  (R26.89);Pain Pain - part of body: (back)    Time: [id number] PT Time Calculation (min) (ACUTE ONLY): 28 min   Charges:   PT Evaluation $PT Eval Low Complexity: 1 Low PT Treatments $Gait Training: 8-22 mins        Anastasio Champion, DPT  Acute Rehabilitation Services Pager [phone] Office [place] 11/29/2019, 4:38 PM

## 2019-11-29 NOTE — Plan of Care (Signed)
  Problem: Health Behavior/Discharge Planning: Goal: Ability to manage health-related needs will improve Outcome: Progressing   Problem: Activity: Goal: Risk for activity intolerance will decrease Outcome: Progressing   Problem: Pain Managment: Goal: General experience of comfort will improve Outcome: Progressing   Problem: Safety: Goal: Ability to remain free from injury will improve Outcome: Progressing   

## 2019-11-29 NOTE — Progress Notes (Addendum)
Per Ortho tech [place], order for [employer] needs clarification due dual orders. Pt up and ambulated for 8 mins in the room. Tolerated well. Noted mid back honeycomb dsg with a small amount of bloody drainage. Marked area on dsg , Updated day shift nurse.

## 2019-11-30 ENCOUNTER — Inpatient Hospital Stay: Payer: Self-pay

## 2019-11-30 LAB — BASIC METABOLIC PANEL
Anion gap: 10 (ref 5–15)
BUN: 15 mg/dL (ref 6–20)
CO2: 26 mmol/L (ref 22–32)
Calcium: 8.2 mg/dL — ABNORMAL LOW (ref 8.9–10.3)
Chloride: 103 mmol/L (ref 98–111)
Creatinine, Ser: 1.1 mg/dL (ref 0.61–1.24)
GFR calc Af Amer: >60 mL/min (ref >60)
GFR calc non Af Amer: >60 mL/min (ref >60)
Glucose, Bld: 135 mg/dL — ABNORMAL HIGH (ref 70–99)
Potassium: 3.2 mmol/L — ABNORMAL LOW (ref 3.5–5.1)
Sodium: 139 mmol/L (ref 135–145)

## 2019-11-30 LAB — HEPATITIS C ANTIBODY: HCV Ab: NONREACTIVE

## 2019-11-30 NOTE — Progress Notes (Signed)
Postop day 2.  Patient resting comfortably without problem.  He is awake and alert.  He is oriented and appropriate.  Motor and sensory function are stable.  He is afebrile.  Wound is clean.  Drain output minimal.  Cultures pulmonary only show Staphylococcus aureus.  Sensitivities are not done yet.  Patient will continue on Keflex.

## 2019-11-30 NOTE — Plan of Care (Signed)

## 2019-11-30 NOTE — Progress Notes (Signed)
Spoke with Rodena Piety, RN c/o PICC. Patient told nurse that the PICC will be placed Monday. ID on board and awaiting sensitives to adjust ordered antibiotics. Patient able to sign consent. Once cleared by ID, placement will move forward.

## 2019-11-30 NOTE — Progress Notes (Addendum)
Patient ID: Justin Soto, male   DOB: [birth year], 58 y.o.   MRN: [id number]          [facility] for Infectious Disease    Date of Admission:  11/28/2019   Day 3 vancomycin        Day 3 cefazolin  He was found to have a superficial lumbar wound infection that did not appear to track from the fascia.  Operative cultures grew MRSA.  Continue vancomycin and discontinuecefazolin now.  I recommend restarting oral doxycycline when he is ready for discharge.         [name], MD [facility] for [facility] [phone] pager   [phone] cell 11/30/2019, 3:33 PM

## 2019-12-01 NOTE — Progress Notes (Addendum)
Pharmacy Antibiotic Note  Justin Soto is a 58 y.o. male admitted on 11/28/2019 with MRSA lumbar surgical wound infection.  Pharmacy has been consulted for Vanco dosing.  ID: MRSA lumbar surgical wound infection, s/p I&D 12/17. Appears to be superficial. Afebrile. WBC WNL. H/o MRSA noted. - failed Keflex and Doxy PTA  Vancomycin 12/17> Ancef 12/17>>12/20  12/17 Lumbar Wound: MRSA 12/17 Blood:   Vancomycin 1750 mg IV Q 24 hrs. Goal AUC 400-550. Expected AUC: 492.5 SCr used: 1.1  Plan: Vancomycin 1750 mg iv Q 24 hours. Levels when decide on final abx. D/c Ancef    Height: 5\' 6"  (167.6 cm) Weight: 264 lb 8.8 oz (120 kg) IBW/kg (Calculated) : 63.8  Temp (24hrs), Avg:98.4 F (36.9 C), Min:98.1 F (36.7 C), Max:98.7 F (37.1 C)  Recent Labs  Lab 11/28/19 1338 11/30/19 0239  WBC 6.6  --   CREATININE 1.18 1.10    Estimated Creatinine Clearance: 89.4 mL/min (by C-G formula based on SCr of 1.1 mg/dL).    Allergies  Allergen Reactions  . Methocarbamol Other (See Comments)    Numbness, and change of taste   . Tizanidine Nausea Only  . Vicodin [Hydrocodone-Acetaminophen] Itching    Can take with benadryl    Dietrich Ke S. Alford Highland, PharmD, BCPS Clinical Staff Pharmacist [email or web address]  [place] 12/01/2019 10:48 AM

## 2019-12-01 NOTE — Progress Notes (Addendum)
Patient ID: BUN RINNE, male   DOB: 1961-10-20, 58 y.o.   MRN: [id number]          [facility] for Infectious Disease    Date of Admission:  11/28/2019   Day 4 vancomycin  He was found to have a superficial lumbar wound infection that did not appear to track from the fascia.  Operative cultures grew MRSA.  Continue vancomycin for now.  If Dr. Arnoldo Morale is comfortable that infection was only superficial I would recommend converting back to oral doxycycline upon discharge.         [name], MD [facility] for Infectious [facility] [phone] pager   [phone] cell 12/01/2019, 11:14 AM

## 2019-12-01 NOTE — Progress Notes (Signed)
  NEUROSURGERY PROGRESS NOTE   No issues overnight.  No concerns this am  EXAM:  BP 124/82 (BP Location: Left Arm)   Pulse 83   Temp 98.4 F (36.9 C) (Oral)   Resp 14   Ht 5\' 6"  (1.676 m)   Wt 120 kg   SpO2 92%   BMI 42.70 kg/m   Awake, alert, oriented  Speech fluent, appropriate  CN grossly intact  5/5 BUE/BLE  Incision: c/d/i  PLAN Stable neurologically Continue abx per ID. PICC line tomorrow Continue supportive care

## 2019-12-01 NOTE — Progress Notes (Signed)
Spoke with Rodena Piety RN to confirm PICC to be placed Monday per ID.

## 2019-12-02 ENCOUNTER — Other Ambulatory Visit: Payer: Self-pay | Admitting: Infectious Disease

## 2019-12-02 DIAGNOSIS — B9562 Methicillin resistant Staphylococcus aureus infection as the cause of diseases classified elsewhere: Secondary | ICD-10-CM

## 2019-12-02 DIAGNOSIS — T8463XA Infection and inflammatory reaction due to internal fixation device of spine, initial encounter: Secondary | ICD-10-CM

## 2019-12-02 MED ORDER — DOXYCYCLINE HYCLATE 100 MG PO TABS: 100.0000 mg | ORAL_TABLET | Freq: Two times a day (BID) | ORAL | 0 refills | Status: DC

## 2019-12-02 MED ORDER — DOXYCYCLINE HYCLATE 100 MG PO TABS: 100.0000 mg | ORAL_TABLET | Freq: Two times a day (BID) | ORAL | Status: DC

## 2019-12-02 MED ORDER — DOXYCYCLINE HYCLATE 100 MG PO TABS: 100.0000 mg | ORAL_TABLET | Freq: Two times a day (BID) | ORAL | 1 refills | Status: AC

## 2019-12-02 MED ORDER — CHLORHEXIDINE GLUCONATE CLOTH 2 % EX PADS
6.0000 | MEDICATED_PAD | Freq: Every day | CUTANEOUS | Status: DC
  Administered 2019-12-02: 6 via TOPICAL

## 2019-12-02 MED ORDER — VANCOMYCIN HCL 1750 MG/350ML IV SOLN
1750.0000 mg | INTRAVENOUS | Status: DC
  Filled 2019-12-02: qty 350

## 2019-12-02 MED ORDER — MUPIROCIN 2 % EX OINT
1.0000 "application " | TOPICAL_OINTMENT | Freq: Two times a day (BID) | CUTANEOUS | Status: DC
  Administered 2019-12-02: 1 via NASAL
  Filled 2019-12-02: qty 22

## 2019-12-02 NOTE — Progress Notes (Signed)
Physical Therapy Treatment Patient Details Name: Justin Soto MRN: [id number] DOB: 1961/04/17 Today's Date: 12/02/2019    History of Present Illness Pt is a 58 y/o male s/p lumbar wound I&D. Pt with prior lumbar fusion surgery on 10/23/19. Additional PMH including but not limited to HTN and ACDF in 2015.    PT Comments    Continuing work on functional mobility and activity tolerance;  Reports he walks the hallways multiple times daily with RW; Stair training initiated today; overall doing well -- I anticipate he will meed acute PT goals next session.   Follow Up Recommendations  No PT follow up     Equipment Recommendations  None recommended by PT    Recommendations for Other Services       Precautions / Restrictions Precautions Precautions: Back Precaution Comments: reviewed 3/3 back precautions with pt Required Braces or Orthoses: Spinal Brace Spinal Brace: Applied in sitting position;Lumbar corset    Mobility  Bed Mobility               General bed mobility comments: in recliner upon arrival  Transfers Overall transfer level: Needs assistance Equipment used: Rolling walker (2 wheeled) Transfers: Sit to/from Stand Sit to Stand: Supervision         General transfer comment: Good rise with use of UEs  Ambulation/Gait Ambulation/Gait assistance: Supervision Gait Distance (Feet): 550 Feet Assistive device: None;Rolling walker (2 wheeled) Gait Pattern/deviations: Step-through pattern;Decreased stride length Gait velocity: decreased   General Gait Details: Walking in the room without assistive device and no overt loss of balance; for longer distance, used RW for suport and steadiness   Stairs Stairs: Yes Stairs assistance: Min guard;Min assist Stair Management: No rails;Step to pattern;Forwards Number of Stairs: 5 General stair comments: He described to me the technqiue he uses to go up the stairs to access his bedroom and bathroom; he does not have a  rail at home (tells me it's an old house); uses step-by-step technqiue; going slowly, and at one point, staedied himself with a hand on this therapist's shoulder descending for support   Wheelchair Mobility    Modified Rankin (Stroke Patients Only)       Balance                                            Cognition Arousal/Alertness: Awake/alert Behavior During Therapy: WFL for tasks assessed/performed Overall Cognitive Status: Within Functional Limits for tasks assessed                                        Exercises      General Comments        Pertinent Vitals/Pain Pain Assessment: Faces Faces Pain Scale: No hurt Pain Location: back Pain Intervention(s): Premedicated before session    Home Living                      Prior Function            PT Goals (current goals can now be found in the care plan section) Acute Rehab PT Goals Patient Stated Goal: to return home PT Goal Formulation: With patient Time For Goal Achievement: 12/13/19 Potential to Achieve Goals: Good Progress towards PT goals: Progressing toward goals(Anticipate he'll meet goals next session)  Frequency    Min 3X/week      PT Plan Current plan remains appropriate    Co-evaluation              AM-PAC PT "6 Clicks" Mobility   Outcome Measure  Help needed turning from your back to your side while in a flat bed without using bedrails?: None Help needed moving from lying on your back to sitting on the side of a flat bed without using bedrails?: None Help needed moving to and from a bed to a chair (including a wheelchair)?: None Help needed standing up from a chair using your arms (e.g., wheelchair or bedside chair)?: None Help needed to walk in hospital room?: None Help needed climbing 3-5 steps with a railing? : A Little 6 Click Score: 23    End of Session Equipment Utilized During Treatment: Back brace Activity Tolerance: Patient  tolerated treatment well Patient left: Other (comment)(Managing independently in the room) Nurse Communication: Mobility status PT Visit Diagnosis: Other abnormalities of gait and mobility (R26.89);Pain Pain - part of body: (back)     Time: [id number] PT Time Calculation (min) (ACUTE ONLY): 17 min  Charges:  $Gait Training: 8-22 mins                     Roney [place], [place]  Acute Rehabilitation Services Pager [phone] Office [place] 12/02/2019, 2:59 PM

## 2019-12-02 NOTE — Progress Notes (Signed)
Pt given dc instructions at this time. Reviewed s/s of infection with patient. Patient verbalizes understanding. Dressing to back intact and dry. No c/o pain.

## 2019-12-02 NOTE — Discharge Summary (Signed)
Physician Discharge Summary  Patient ID: Justin Soto MRN: [id number] DOB/AGE: June 16, 1961 58 y.o.  Admit date: 11/28/2019 Discharge date: 12/02/2019  Admission Diagnoses: Postoperative wound infection  Discharge Diagnoses:  Active Problems:   Postoperative wound infection   Discharged Condition: good  Hospital Course: Patient underwent an uncomplicated lumbar decompression and fusion on 10/23/2019. About three weeks after surgery, he began having some drainage from his wound. This worsened in spite of oral antibiotics and the patient was admitted on 11/28/2019 for an incision and drainage of his surgical wound.  Consults: ID and rehabilitation medicine  Significant Diagnostic Studies: labs:  Results for orders placed or performed during the hospital encounter of 11/28/19 (from the past 72 hour(s))  Basic metabolic panel     Status: Abnormal   Collection Time: 11/30/19  2:39 AM  Result Value Ref Range   Sodium 139 135 - 145 mmol/L   Potassium 3.2 (L) 3.5 - 5.1 mmol/L   Chloride 103 98 - 111 mmol/L   CO2 26 22 - 32 mmol/L   Glucose, Bld 135 (H) 70 - 99 mg/dL   BUN 15 6 - 20 mg/dL   Creatinine, Ser 1.10 0.61 - 1.24 mg/dL   Calcium 8.2 (L) 8.9 - 10.3 mg/dL   GFR calc non Af Amer >60 >60 mL/min   GFR calc Af Amer >60 >60 mL/min   Anion gap 10 5 - 15    Comment: Performed at [facility], [place] [address] [place], [place] [address]  Hepatitis C antibody     Status: None   Collection Time: 11/30/19  2:39 AM  Result Value Ref Range   HCV Ab NON REACTIVE NON REACTIVE    Comment: (NOTE) Nonreactive HCV antibody screen is consistent with no HCV infections,  unless recent infection is suspected or other evidence exists to indicate HCV infection. Performed at [facility], [place] [address], [place], [place] [address]      Treatments: surgery: Incision and drainage of lumbar wound  Discharge Exam: Blood pressure 138/80, pulse 85, temperature 98.8 F (37.1 C),  temperature source Oral, resp. rate 16, height 5\' 6"  (1.676 m), weight 120 kg, SpO2 96 %.   Alert and oriented x 4 PERRLA CN II-XII grossly intact MAE, Strength and sensation intact Incision is covered with Honeycomb dressing and Steri Strips; Dressing is clean, dry, and intact   Disposition:    Allergies as of 12/02/2019      Reactions   Methocarbamol Other (See Comments)   Numbness, and change of taste    Tizanidine Nausea Only   Vicodin [hydrocodone-acetaminophen] Itching   Can take with benadryl       Medication List    STOP taking these medications   cephALEXin 500 MG capsule Commonly known as: KEFLEX     TAKE these medications   amLODipine-benazepril 10-40 MG capsule Commonly known as: LOTREL TAKE 1 CAPSULE BY MOUTH EVERY DAY What changed: how much to take   aspirin EC 81 MG tablet Take 81 mg by mouth daily.   atorvastatin 40 MG tablet Commonly known as: LIPITOR Take 40 mg by mouth daily.   cetirizine 10 MG tablet Commonly known as: ZYRTEC TAKE ONE TO TWO TABLETS BY MOUTH EVERY DAY AS NEEDED. What changed:   how much to take  how to take this  when to take this  additional instructions   cyclobenzaprine 10 MG tablet Commonly known as: FLEXERIL Take 1 tablet (10 mg total) by mouth 3 (three) times  daily as needed for muscle spasms.   diazepam 5 MG tablet Commonly known as: VALIUM Take 10 mg by mouth at bedtime.   docusate sodium 100 MG capsule Commonly known as: COLACE Take 1 capsule (100 mg total) by mouth 2 (two) times daily. What changed:   when to take this  reasons to take this   doxycycline 100 MG tablet Commonly known as: VIBRA-TABS Take 1 tablet (100 mg total) by mouth 2 (two) times daily for 28 days.   ferrous sulfate 325 (65 FE) MG tablet Take 1 tablet (325 mg total) by mouth 2 (two) times daily with a meal.   finasteride 5 MG tablet Commonly known as: PROSCAR Take 1 tablet (5 mg total) by mouth daily.   fluticasone 50  MCG/ACT nasal spray Commonly known as: FLONASE USE TWO SPRAYS IN EACH NOSTRIL EVERY DAY   gabapentin 300 MG capsule Commonly known as: NEURONTIN Take 600 mg by mouth 2 (two) times daily.   hydrochlorothiazide 25 MG tablet Commonly known as: HYDRODIURIL TAKE ONE TABLET BY MOUTH EVERY DAY   meloxicam 15 MG tablet Commonly known as: MOBIC Take 15 mg by mouth daily.   metoprolol tartrate 25 MG tablet Commonly known as: LOPRESSOR TAKE 1 TABLET BY MOUTH EVERY DAY   montelukast 10 MG tablet Commonly known as: SINGULAIR TAKE ONE TABLET BY MOUTH EVERY MORNING What changed: when to take this   multivitamin with minerals Tabs tablet Take 1 tablet by mouth daily.   Omeprazole 20 MG Tbec Take 20 mg by mouth daily.   oxyCODONE-acetaminophen 10-325 MG tablet Commonly known as: PERCOCET Take 0.5-1 tablets by mouth every 4 (four) hours as needed for pain.   tamsulosin 0.4 MG Caps capsule Commonly known as: FLOMAX TAKE TWO CAPSULES EACH DAY AS DIRECTED What changed:   how much to take  how to take this  when to take this  additional instructions      Follow-up Information    Newman Pies, MD. Schedule an appointment as soon as possible for a visit in 2 week(s).   Specialty: Neurosurgery Contact information: [address] [place] [address] [phone]           Signed: Patricia Nettle 12/02/2019, 3:11 PM

## 2019-12-02 NOTE — Plan of Care (Signed)
  Problem: Education: Goal: Knowledge of General Education information will improve Description: Including pain rating scale, medication(s)/side effects and non-pharmacologic comfort measures Outcome: Progressing   Problem: Health Behavior/Discharge Planning: Goal: Ability to manage health-related needs will improve Outcome: Progressing   Problem: Activity: Goal: Risk for activity intolerance will decrease Outcome: Progressing   Problem: Elimination: Goal: Will not experience complications related to bowel motility Outcome: Progressing Goal: Will not experience complications related to urinary retention Outcome: Progressing   Problem: Pain Managment: Goal: General experience of comfort will improve Outcome: Progressing   Problem: Safety: Goal: Ability to remain free from injury will improve Outcome: Progressing   Problem: Skin Integrity: Goal: Risk for impaired skin integrity will decrease Outcome: Progressing

## 2019-12-02 NOTE — Progress Notes (Signed)
Subjective: No new complaints   Antibiotics:  Anti-infectives (From admission, onward)   Start     Dose/Rate Route Frequency Ordered Stop   12/02/19 2100  vancomycin (VANCOREADY) IVPB 1750 mg/350 mL     1,750 mg 175 mL/hr over 120 Minutes Intravenous Every 24 hours 12/02/19 1212     12/02/19 1100  doxycycline (VIBRA-TABS) tablet 100 mg  Status:  Discontinued     100 mg Oral Every 12 hours 12/02/19 1058 12/02/19 1212   12/02/19 0000  doxycycline (VIBRA-TABS) 100 MG tablet     100 mg Oral 2 times daily 12/02/19 1526 12/30/19 2359   11/29/19 2100  vancomycin (VANCOREADY) IVPB 1750 mg/350 mL  Status:  Discontinued     1,750 mg 175 mL/hr over 120 Minutes Intravenous Every 24 hours 11/28/19 2055 12/02/19 1101   11/29/19 1400  ceFAZolin (ANCEF) IVPB 2g/100 mL premix  Status:  Discontinued     2 g 200 mL/hr over 30 Minutes Intravenous Every 8 hours 11/29/19 1334 12/01/19 1044   11/28/19 2200  ceFAZolin (ANCEF) IVPB 1 g/50 mL premix  Status:  Discontinued     1 g 100 mL/hr over 30 Minutes Intravenous Every 8 hours 11/28/19 2040 11/28/19 2041   11/28/19 2100  vancomycin (VANCOREADY) IVPB 1500 mg/300 mL     1,500 mg 150 mL/hr over 120 Minutes Intravenous  Once 11/28/19 2055 11/29/19 0014   11/28/19 1712  bacitracin 50,000 Units in sodium chloride 0.9 % 500 mL irrigation  Status:  Discontinued       As needed 11/28/19 1712 11/28/19 1752   11/28/19 1712  vancomycin (VANCOCIN) powder  Status:  Discontinued       As needed 11/28/19 1713 11/28/19 1752   11/28/19 1600  ceFAZolin (ANCEF) IVPB 2g/100 mL premix     2 g 200 mL/hr over 30 Minutes Intravenous On call to O.R. 11/28/19 1540 11/28/19 1700      Medications: Scheduled Meds: . amLODipine  10 mg Oral Daily   And  . benazepril  40 mg Oral Daily  . atorvastatin  40 mg Oral Daily  . Chlorhexidine Gluconate Cloth  6 each Topical Daily  . docusate sodium  100 mg Oral BID  . ferrous sulfate  325 mg Oral BID WC  . finasteride   5 mg Oral Daily  . fluticasone  2 spray Each Nare Daily  . gabapentin  600 mg Oral BID  . hydrochlorothiazide  25 mg Oral Daily  . loratadine  10 mg Oral Daily  . metoprolol tartrate  25 mg Oral Daily  . montelukast  10 mg Oral Daily  . mupirocin ointment  1 application Nasal BID  . pantoprazole  40 mg Oral Daily  . sodium chloride flush  3 mL Intravenous Q12H  . tamsulosin  0.8 mg Oral Daily   Continuous Infusions: . sodium chloride Stopped (11/29/19 0609)  . vancomycin     PRN Meds:.acetaminophen **OR** acetaminophen, bisacodyl, cyclobenzaprine, menthol-cetylpyridinium **OR** phenol, morphine injection, ondansetron **OR** ondansetron (ZOFRAN) IV, oxyCODONE, oxyCODONE, sodium chloride flush    Objective: Weight change:   Intake/Output Summary (Last 24 hours) at 12/02/2019 1609 Last data filed at 12/02/2019 0415 Gross per 24 hour  Intake --  Output 700 ml  Net -700 ml   Blood pressure 138/80, pulse 85, temperature 98.8 F (37.1 C), temperature source Oral, resp. rate 16, height 5\' 6"  (1.676 m), weight 120 kg, SpO2 96 %. Temp:  [98.4 F (36.9 C)-99.4 F (37.4  C)] 98.8 F (37.1 C) (12/21 0829) Pulse Rate:  [75-85] 85 (12/21 0829) Resp:  [14-16] 16 (12/21 0829) BP: (138-139)/(71-80) 138/80 (12/21 0829) SpO2:  [94 %-96 %] 96 % (12/21 0829)  Physical Exam: General: Alert and awake, oriented x3, not in any acute distress. HEENT: anicteric sclera, EOMI Is seated comfortably in his chair Skin: no rashes Neuro: nonfocal  CBC:    BMET Recent Labs    11/30/19 0239  NA 139  K 3.2*  CL 103  CO2 26  GLUCOSE 135*  BUN 15  CREATININE 1.10  CALCIUM 8.2*     Liver Panel  No results for input(s): PROT, ALBUMIN, AST, ALT, ALKPHOS, BILITOT, BILIDIR, IBILI in the last 72 hours.     Sedimentation Rate No results for input(s): ESRSEDRATE in the last 72 hours. C-Reactive Protein No results for input(s): CRP in the last 72 hours.  Micro Results: Recent Results  (from the past 720 hour(s))  Aerobic/Anaerobic Culture (surgical/deep wound)     Status: None (Preliminary result)   Collection Time: 11/28/19  5:01 PM   Specimen: Wound  Result Value Ref Range Status   Specimen Description WOUND LUMBAR  Final   Special Requests NONE  Final   Gram Stain   Final    ABUNDANT WBC PRESENT, PREDOMINANTLY PMN NO ORGANISMS SEEN Performed at [facility], [address] [place], [place] [address]    Culture   Final    RARE METHICILLIN RESISTANT STAPHYLOCOCCUS AUREUS NO ANAEROBES ISOLATED; CULTURE IN PROGRESS FOR 5 DAYS    Report Status PENDING  Incomplete   Organism ID, Bacteria METHICILLIN RESISTANT STAPHYLOCOCCUS AUREUS  Final      Susceptibility   Methicillin resistant staphylococcus aureus - MIC*    CIPROFLOXACIN <=0.5 SENSITIVE Sensitive     ERYTHROMYCIN >=8 RESISTANT Resistant     GENTAMICIN <=0.5 SENSITIVE Sensitive     OXACILLIN >=4 RESISTANT Resistant     TETRACYCLINE 2 SENSITIVE Sensitive     VANCOMYCIN 1 SENSITIVE Sensitive     TRIMETH/SULFA <=10 SENSITIVE Sensitive     CLINDAMYCIN <=0.25 SENSITIVE Sensitive     RIFAMPIN <=0.5 SENSITIVE Sensitive     Inducible Clindamycin NEGATIVE Sensitive     * RARE METHICILLIN RESISTANT STAPHYLOCOCCUS AUREUS  Culture, blood (Routine X 2) w Reflex to ID Panel     Status: None (Preliminary result)   Collection Time: 11/28/19  9:00 PM   Specimen: BLOOD  Result Value Ref Range Status   Specimen Description BLOOD RIGHT ARM  Final   Special Requests   Final    BOTTLES DRAWN AEROBIC AND ANAEROBIC Blood Culture adequate volume   Culture   Final    NO GROWTH 4 DAYS Performed at [facility], [address] [place], [place] [address]    Report Status PENDING  Incomplete  Culture, blood (Routine X 2) w Reflex to ID Panel     Status: None (Preliminary result)   Collection Time: 11/28/19  9:05 PM   Specimen: BLOOD  Result Value Ref Range Status   Specimen Description BLOOD RIGHT WRIST  Final    Special Requests   Final    BOTTLES DRAWN AEROBIC ONLY Blood Culture adequate volume   Culture   Final    NO GROWTH 4 DAYS Performed at [facility], [place] [address], [place],  [address]    Report Status PENDING  Incomplete    Studies/Results: No results found.    Assessment/Plan:  INTERVAL HISTORY: Operative cultures  yielded methicillin resistant sensitive staph coccus aureus sensitive to doxycycline.  Sed rate was elevated   Active Problems:   Postoperative wound infection    Justin Soto is a 58 y.o. male with recent lumbar fusion who developed a superficial wound infection that was I indeed and grew methicillin resistant staph coccus aureus.  He did have significant back pain when he arrived in the hospital and still has some at this point in time.  His inflammatory markers were elevated as well.  I had extensive discussion about with the patient regarding anxieties about deeper infection that were not seen with Dr. Arnoldo Morale operated on him.  I offered him a number of options including getting an MRI today and if this was abnormal considering IV antibiotics versus course of oral doxycycline that I would give in a more protracted manner and then to reevaluate his pain and his inflammatory markers in the outpatient world.  He initially seemed to be wanting to do the former but later apparently was eager to go home and is being discharged on doxycycline.  I have arranged for hospital follow-up with him with me.   Justin Soto has an appointment on December 25, 2019 with Dr. Drucilla Schmidt at 2 PM  [facility] is located in the [facility] at  [address] in [place].  [address], which is located to the left of the elevators.  Phone: [phone]  Fax: [phone]  [email or web address]  He should arrive 15 minutes early for his appointment.   LOS: 4 days   Alcide Evener 12/02/2019,  4:09 PM

## 2019-12-02 NOTE — Plan of Care (Signed)

## 2019-12-03 ENCOUNTER — Other Ambulatory Visit: Payer: Self-pay | Admitting: Family Medicine

## 2019-12-03 LAB — CULTURE, BLOOD (ROUTINE X 2)
Culture: NO GROWTH
Culture: NO GROWTH
Special Requests: ADEQUATE
Special Requests: ADEQUATE

## 2019-12-04 LAB — AEROBIC/ANAEROBIC CULTURE W GRAM STAIN (SURGICAL/DEEP WOUND)

## 2019-12-09 DIAGNOSIS — I10 Essential (primary) hypertension: Secondary | ICD-10-CM | POA: Diagnosis not present

## 2019-12-09 DIAGNOSIS — G4733 Obstructive sleep apnea (adult) (pediatric): Secondary | ICD-10-CM | POA: Diagnosis not present

## 2019-12-09 DIAGNOSIS — E782 Mixed hyperlipidemia: Secondary | ICD-10-CM | POA: Diagnosis not present

## 2019-12-14 ENCOUNTER — Other Ambulatory Visit: Payer: Self-pay | Admitting: Family Medicine

## 2019-12-14 DIAGNOSIS — I10 Essential (primary) hypertension: Secondary | ICD-10-CM

## 2019-12-25 ENCOUNTER — Ambulatory Visit: Payer: BC Managed Care – PPO | Admitting: Infectious Disease

## 2019-12-30 DIAGNOSIS — G4733 Obstructive sleep apnea (adult) (pediatric): Secondary | ICD-10-CM | POA: Diagnosis not present

## 2020-01-14 DIAGNOSIS — Z981 Arthrodesis status: Secondary | ICD-10-CM | POA: Diagnosis not present

## 2020-01-19 ENCOUNTER — Other Ambulatory Visit: Payer: Self-pay | Admitting: Family Medicine

## 2020-01-19 DIAGNOSIS — I10 Essential (primary) hypertension: Secondary | ICD-10-CM

## 2020-01-19 NOTE — Telephone Encounter (Signed)
Requested Prescriptions  Pending Prescriptions Disp Refills  . amLODipine-benazepril (LOTREL) 10-40 MG capsule [Pharmacy Med Name: AMLODIPINE-BENAZEPRIL 10-40 MG] 90 capsule 3    Sig: TAKE 1 CAPSULE BY MOUTH EVERY DAY     Cardiovascular: CCB + ACEI Combos Failed - 01/19/2020 12:56 AM      Failed - K in normal range and within 180 days    Potassium  Date Value Ref Range Status  11/30/2019 3.2 (L) 3.5 - 5.1 mmol/L Final  08/11/2014 4.1 3.5 - 5.1 mmol/L Final         Passed - Cr in normal range and within 180 days    Creatinine  Date Value Ref Range Status  08/11/2014 1.01 0.60 - 1.30 mg/dL Final   Creatinine, Ser  Date Value Ref Range Status  11/30/2019 1.10 0.61 - 1.24 mg/dL Final         Passed - Patient is not pregnant      Passed - Last BP in normal range    BP Readings from Last 1 Encounters:  12/02/19 138/80         Passed - Valid encounter within last 6 months    Recent Outpatient Visits          2 months ago Postoperative fever   [facility] [place], Dionne Bucy, [place]   7 months ago SOB (shortness of breath)   [facility], Clearnce Sorrel, [place]   1 year ago Acute non-recurrent pansinusitis   [place], [place]   1 year ago Right sided sciatica   [place], Vickki Muff, [place]   1 year ago [facility] spotted fever)   [place], [place], [place]

## 2020-02-23 ENCOUNTER — Telehealth: Payer: Self-pay | Admitting: Urology

## 2020-02-23 NOTE — Telephone Encounter (Signed)
Please call Justin Soto and have him schedule an appointment for a PSA and get him set up for his missed prostate biopsy.

## 2020-02-24 ENCOUNTER — Other Ambulatory Visit: Payer: Self-pay | Admitting: *Deleted

## 2020-02-24 DIAGNOSIS — C61 Malignant neoplasm of prostate: Secondary | ICD-10-CM

## 2020-02-24 NOTE — Telephone Encounter (Signed)
Spoke to patient scheduled the Lab. He wants to discuss the Biopsy before scheduling. Appointment made to see [facility].

## 2020-02-25 ENCOUNTER — Other Ambulatory Visit: Payer: BC Managed Care – PPO

## 2020-02-25 ENCOUNTER — Other Ambulatory Visit: Payer: Self-pay

## 2020-02-25 DIAGNOSIS — C61 Malignant neoplasm of prostate: Secondary | ICD-10-CM | POA: Diagnosis not present

## 2020-02-26 LAB — PSA: Prostate Specific Ag, Serum: 1.4 ng/mL (ref 0.0–4.0)

## 2020-03-05 ENCOUNTER — Ambulatory Visit (INDEPENDENT_AMBULATORY_CARE_PROVIDER_SITE_OTHER): Payer: BC Managed Care – PPO | Admitting: Urology

## 2020-03-05 ENCOUNTER — Other Ambulatory Visit: Payer: Self-pay

## 2020-03-05 ENCOUNTER — Encounter: Payer: Self-pay | Admitting: Urology

## 2020-03-05 VITALS — BP 150/101 | HR 98 | Ht 66.0 in | Wt 269.0 lb

## 2020-03-05 DIAGNOSIS — C61 Malignant neoplasm of prostate: Secondary | ICD-10-CM

## 2020-03-05 DIAGNOSIS — N138 Other obstructive and reflux uropathy: Secondary | ICD-10-CM

## 2020-03-05 DIAGNOSIS — N401 Enlarged prostate with lower urinary tract symptoms: Secondary | ICD-10-CM

## 2020-03-05 DIAGNOSIS — N475 Adhesions of prepuce and glans penis: Secondary | ICD-10-CM

## 2020-03-05 DIAGNOSIS — N529 Male erectile dysfunction, unspecified: Secondary | ICD-10-CM | POA: Diagnosis not present

## 2020-03-05 NOTE — Progress Notes (Signed)
03/05/2020 9:23 AM   Justin Soto July 14, 1961 [id number]  Referring provider: Jerrol Banana., MD [address] [place],  [place] [address]  Chief Complaint  Patient presents with   Follow-up    Discuss Biopsy     HPI: Patient is a 59 year old male with a history of buried penis and penile adhesions and BPH with LU TS who presents today for follow up.    Prostate cancer Found to have a slowly rising PSA on finasteride with abnormal DRE..  PSA 2.0 (4.0).  Biopsy on 07/25/2018 with Dr. Bernardo Heater.  Prostate volume was 48 g.  Standard 12 core biopsies were performed.  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  The left lateral apex showed atypical cells suspicious for cancer.  Percent of tissue involved range from 1-25%.  On active surveillance.  He returned on 07/06/2019 for prostrate MRI.  No radiographic evidence of high-grade prostate carcinoma. PI-RADS 1: Very Low (clinically significant cancer is highly unlikely to be present).  Prostate volume 37 cc.  PSAD 0.076.  He was scheduled for confirmatory biopsy in 08/2019, but he was having issues with his back.  He underwent back surgery and had a post-operative complication of wound infection.   He was also diagnosed with COVID.  Started finasteride in 10/2016  Component     Latest Ref Rng & Units 09/09/2015 11/23/2017 06/05/2018 12/06/2018  Prostate Specific Ag, Serum     0.0 - 4.0 ng/mL 3.2 2.1 2.0 2.4   Component     Latest Ref Rng & Units 02/25/2019 02/25/2020  Prostate Specific Ag, Serum     0.0 - 4.0 ng/mL 1.5 1.4    Buried penis/penile adhesions Taken to the operating room on 12/13/17 for cystoscopy, lysis of penile adhesions.  Intraoperatively, the lesions were noted to be fairly significant involving greater than 75% of the coronal margin.  These were taken down bluntly and sharply intraoperatively.   There was no significant redundant foreskin for resection.  Initially following the surgery, he kept the area covered with Vaseline and was diligent about pulling back his skin to avoid re-adhesion.  He now has an area at the right lateral coronal margin which has become sore, red, with recurrent adhesive disease.  He is unable to break these adhesions.  He has had significant balanitis in the past and responded well to oral fluconazole as well as topical cream.  BPH WITH LUTS  (prostate and/or bladder) IPSS score: 20/3   Previous score: 16/2  Previous PVR: 0 mL  Major complaint(s): Feelings of incomplete bladder emptying, urgency, intermittency, frequency, weak urinary stream, straining to urinate and nocturia. He is on CPAP.  Denies any dysuria, hematuria or suprapubic pain.   Currently taking: tamsulosin 0.4 mg daily and finasteride 5 mg daily.  Denies any recent fevers, chills, nausea or vomiting.  His father had prostate cancer treated with RRP, died of Alzheimer's   IPSS    Row Name 03/05/20 1000         International Prostate Symptom Score   How often have you had the sensation of not emptying your bladder?  Less than half the time     How often have you had to urinate less than every two hours?  More than half the time     How often have you found you stopped and started again several times when you  urinated?  Less than half the time     How often have you found it difficult to postpone urination?  More than half the time     How often have you had a weak urinary stream?  More than half the time     How often have you had to strain to start urination?  Less than half the time     How many times did you typically get up at night to urinate?  2 Times     Total IPSS Score  20       Quality of Life due to urinary symptoms   If you were to spend the rest of your life with your urinary condition just the way it is now how would you feel about that?  Mixed        Score:  1-7  Mild 8-19 Moderate 20-35 Severe  Erectile dysfunction SHIM score: 15     Main complaint: Low confidence in getting erection, erections not firm enough for penetration and not been able to maintain erections  Risk factors:  age, BPH, prostate cancer, DM, HTN, HLD and sleep apnea No painful erections or curvatures with his erections.    No longer having spontaneous erections  SHIM    Row Name 03/05/20 1008         SHIM: Over the last 6 months:   How do you rate your confidence that you could get and keep an erection?  Low     When you had erections with sexual stimulation, how often were your erections hard enough for penetration (entering your partner)?  Sometimes (about half the time)     During sexual intercourse, how often were you able to maintain your erection after you had penetrated (entered) your partner?  Sometimes (about half the time)     During sexual intercourse, how difficult was it to maintain your erection to completion of intercourse?  Difficult     When you attempted sexual intercourse, how often was it satisfactory for you?  Most Times (much more than half the time)       SHIM Total Score   SHIM  15        Score: 1-7 Severe ED 8-11 Moderate ED 12-16 Mild-Moderate ED 17-21 Mild ED 22-25 No ED   PMH: Past Medical History:  Diagnosis Date   Anemia    Arthritis    Dyspnea    Enlarged prostate    hx of   Environmental and seasonal allergies    Frequency of urination    GERD (gastroesophageal reflux disease)    History of kidney stones    HOH (hard of hearing)    wears bilateral hearing aids   Hypertension    Irregular heart beat    Pt feels this every once in a while; PCP aware but stated it was OK   Kidney stone on right side 01/15/2017   Leg pain, right    Take Lyrica and Naproxen   Neck pain of over 3 months duration    Sleep apnea    Thalassemia    Tick fever     Surgical History: Past Surgical History:  Procedure  Laterality Date   ANTERIOR CERVICAL DECOMP/DISCECTOMY FUSION N/A 11/13/2014   Procedure: Cervical three-four, Cervical six-seven anterior cervical decompression with fusion interbody prosthesis plating and bonegraft;  Surgeon: Newman Pies, MD;  Location: [place] [facility];  Service: Neurosurgery;  Laterality: N/A;  Cervical three-four, Cervical six-seven anterior cervical decompression with fusion interbody  prosthesis plating and bonegraft   BACK SURGERY     Lumbar X 2   CALCANEAL OSTEOTOMY Right 01/11/2017   Procedure: CALCANEAL OSTEOTOMY;  Surgeon: Earnestine Leys, MD;  Location: [facility];  Service: Orthopedics;  Laterality: Right;   CIRCUMCISION N/A 12/13/2017   Procedure: PENILE BLOCK, LYSIS OF PENILE ADHESIONS;  Surgeon: Hollice Espy, MD;  Location: [facility];  Service: Urology;  Laterality: N/A;   COLONOSCOPY     CYSTOSCOPY N/A 12/13/2017   Procedure: CYSTOSCOPY;  Surgeon: Hollice Espy, MD;  Location: [facility];  Service: Urology;  Laterality: N/A;   HERNIA REPAIR Left    groin   LEG SURGERY Right    Had surgery on right calf X 5   LITHOTRIPSY     LUMBAR WOUND DEBRIDEMENT N/A 11/28/2019   Procedure: LUMBAR WOUND DEBRIDEMENT;  Surgeon: Newman Pies, MD;  Location: [place];  Service: Neurosurgery;  Laterality: N/A;  LUMBAR WOUND DEBRIDEMENT   QUADRICEPS TENDON REPAIR Left 06/22/2015   Procedure: REPAIR QUADRICEP TENDON;  Surgeon: Earnestine Leys, MD;  Location: [facility];  Service: Orthopedics;  Laterality: Left;   QUADRICEPS TENDON REPAIR Left 08/12/2015   Procedure: REPAIR QUADRICEP TENDON;  Surgeon: Earnestine Leys, MD;  Location: [facility];  Service: Orthopedics;  Laterality: Left;   ROTATOR CUFF REPAIR Left    TONSILLECTOMY     VASECTOMY     with hydrocele repaired    Home Medications:  Allergies as of 03/05/2020      Reactions   Methocarbamol Other (See Comments)   Numbness, and change of taste    Tizanidine Nausea Only   Vicodin [hydrocodone-acetaminophen] Itching   Can  take with benadryl       Medication List       Accurate as of March 05, 2020 11:59 PM. If you have any questions, ask your nurse or doctor.        amLODipine-benazepril 10-40 MG capsule Commonly known as: LOTREL TAKE 1 CAPSULE BY MOUTH EVERY DAY   aspirin EC 81 MG tablet Take 81 mg by mouth daily.   atorvastatin 40 MG tablet Commonly known as: LIPITOR Take 40 mg by mouth daily.   cetirizine 10 MG tablet Commonly known as: ZYRTEC TAKE ONE TO TWO TABLETS BY MOUTH EVERY DAY AS NEEDED. What changed:   how much to take  how to take this  when to take this  additional instructions   cyclobenzaprine 10 MG tablet Commonly known as: FLEXERIL Take 1 tablet (10 mg total) by mouth 3 (three) times daily as needed for muscle spasms.   diazepam 5 MG tablet Commonly known as: VALIUM Take 10 mg by mouth at bedtime.   docusate sodium 100 MG capsule Commonly known as: COLACE Take 1 capsule (100 mg total) by mouth 2 (two) times daily. What changed:   when to take this  reasons to take this   doxycycline 100 MG tablet Commonly known as: VIBRA-TABS Take 100 mg by mouth 2 (two) times daily.   ferrous sulfate 325 (65 FE) MG tablet Take 1 tablet (325 mg total) by mouth 2 (two) times daily with a meal.   finasteride 5 MG tablet Commonly known as: PROSCAR Take 1 tablet (5 mg total) by mouth daily.   fluticasone 50 MCG/ACT nasal spray Commonly known as: FLONASE USE TWO SPRAYS IN EACH NOSTRIL EVERY DAY What changed:   how much to take  when to take this  reasons to take this   gabapentin 300 MG capsule Commonly known as: NEURONTIN Take  600 mg by mouth 2 (two) times daily.   hydrochlorothiazide 25 MG tablet Commonly known as: HYDRODIURIL TAKE 1 TABLET BY MOUTH EVERY DAY   isosorbide mononitrate 30 MG 24 hr tablet Commonly known as: IMDUR Take 30 mg by mouth daily.   meloxicam 15 MG tablet Commonly known as: MOBIC Take 15 mg by mouth daily.   metoprolol  tartrate 25 MG tablet Commonly known as: LOPRESSOR TAKE 1 TABLET BY MOUTH EVERY DAY   montelukast 10 MG tablet Commonly known as: SINGULAIR TAKE 1 TABLET BY MOUTH EVERY DAY IN THE MORNING   multivitamin with minerals Tabs tablet Take 1 tablet by mouth daily.   Omeprazole 20 MG Tbec Take 20 mg by mouth daily.   oxyCODONE-acetaminophen 10-325 MG tablet Commonly known as: PERCOCET Take 0.5-1 tablets by mouth every 4 (four) hours as needed for pain.   tamsulosin 0.4 MG Caps capsule Commonly known as: FLOMAX TAKE TWO CAPSULES EACH DAY AS DIRECTED What changed:   how much to take  how to take this  when to take this  additional instructions       Allergies:  Allergies  Allergen Reactions   Methocarbamol Other (See Comments)    Numbness, and change of taste    Tizanidine Nausea Only   Vicodin [Hydrocodone-Acetaminophen] Itching    Can take with benadryl     Family History: Family History  Problem Relation Age of Onset   Prostate cancer Father    Heart disease Father    Diabetes Father    Alzheimer's disease Father    Anemia Mother    Osteoporosis Mother    Diabetes Brother    Hypertension Brother    Anemia Daughter    Kidney disease Neg Hx    Bladder Cancer Neg Hx     Social History:  reports that he has never smoked. He has never used smokeless tobacco. He reports that he does not drink alcohol or use drugs.  ROS: For pertinent review of systems please refer to history of present illness  Physical Exam: BP (!) 150/101 (BP Location: Left Arm, Patient Position: Sitting, Cuff Size: Large)    Pulse 98    Ht 5\' 6"  (1.676 m)    Wt 269 lb (122 kg)    BMI 43.42 kg/m   Constitutional:  Well nourished. Alert and oriented, No acute distress. HEENT:  AT, mask in place.  Trachea midline, no masses. Cardiovascular: No clubbing, cyanosis, or edema. Respiratory: Normal respiratory effort, no increased work of breathing. GI: Abdomen is soft, non  tender, non distended, no abdominal masses.  GU: No CVA tenderness.  No bladder fullness or masses.  Patient with circumcised phallus.   Urethral meatus is patent.  No penile discharge. No penile lesions or rashes.  Penile adhesions noted on the right coronal margin at the left coronal margin.  Scrotum without lesions, cysts, rashes and/or edema.  Testicles are located scrotally bilaterally. No masses are appreciated in the testicles. Left and right epididymis are normal. Rectal: Patient with  normal sphincter tone. Anus and perineum without scarring or rashes. No rectal masses are appreciated. Prostate is approximately 45 grams, very firm, no nodules are appreciated. Seminal vesicles are normal. Skin: No rashes, bruises or suspicious lesions. Lymph: No inguinal adenopathy. Neurologic: Grossly intact, no focal deficits, moving all 4 extremities. Psychiatric: Normal mood and affect.  Laboratory Data: PSA Trend  3.2 in 10/2012  3.2 in 08/2015  3.31 in 10/2016 - started on finasteride  2.1 (4.2)in 11/2017  2.0 (4.0) 0in 05/2018 Lab Results  Component Value Date   WBC 6.6 11/28/2019   HGB 8.1 (L) 11/28/2019   HCT 26.7 (L) 11/28/2019   MCV 63.7 (L) 11/28/2019   PLT 336 11/28/2019    Lab Results  Component Value Date   CREATININE 1.10 11/30/2019     Lab Results  Component Value Date   HGBA1C 7.0 (H) 10/24/2019    Urinalysis Lab Results  Component Value Date   SPECGRAV 1.015 10/31/2019   PHUR 6.5 10/31/2019   COLORU yellow 10/31/2019   APPEARANCEUR Clear 11/23/2017   LEUKOCYTESUR Negative 10/31/2019   PROTEINUR Negative 10/31/2019   GLUCOSEU Negative 11/23/2017   KETONESU Negative 10/31/2019   RBCU Negative 11/23/2017   BILIRUBINUR Negative 10/31/2019   UUROB 0.2 11/23/2017   NITRITE Negative 10/31/2019    Lab Results  Component Value Date   LABMICR See below: 11/23/2017   WBCUA 0-5 11/23/2017   RBCUA None seen 11/23/2017   LABEPIT None seen 11/23/2017   MUCUS  Present (A) 11/23/2017   BACTERIA Few (A) 11/23/2017    Pertinent Imaging: N/A  Assessment & Plan:    1. Prostate cancer T1c low risk prostate cancer on active surveillance Patient would like to pursue a repeat biopsy, but he would like to have 2 more months so that he can fully recover from his Covid infection and postoperative wound infection from his back surgery His PSA and exam are unchanged and prostate MRI was negative for high-grade lesion, so it reasonable to have the biopsy in 2 months when he is mentally ready He will call us when he is ready to schedule  2. BPH with LUTS IPSS score is 20/3, it is worsening Continue conservative management, avoiding bladder irritants and timed voiding's He would like to wait until the prostate biopsy behind him before addressing urinary issues Continue tamsulosin 0.4 mg daily and finasteride 5 mg daily RTC for biopsy of prostate   3. Erectile dysfunction SHIM score is 15 He would like to wait on the prostate biopsies behind him before addressing this issue  4. Penile adhesions Significant penile adhesions status post lysis of adhesions in the operating room  Recurrence along right coronal margin and left margin He would like to wait on the prostate biopsies behind him before addressing this issue  Return in about 2 months (around 05/05/2020) for patient to call for appointment .  Zara Council, PA-C  [facility] [address] [place], [place] [address] [phone]

## 2020-03-26 ENCOUNTER — Other Ambulatory Visit: Payer: Self-pay | Admitting: Urology

## 2020-03-26 DIAGNOSIS — R350 Frequency of micturition: Secondary | ICD-10-CM

## 2020-03-30 DIAGNOSIS — G4733 Obstructive sleep apnea (adult) (pediatric): Secondary | ICD-10-CM | POA: Diagnosis not present

## 2020-04-06 ENCOUNTER — Other Ambulatory Visit: Payer: Self-pay

## 2020-04-06 ENCOUNTER — Ambulatory Visit: Payer: BC Managed Care – PPO | Admitting: Physician Assistant

## 2020-04-06 ENCOUNTER — Encounter: Payer: Self-pay | Admitting: Physician Assistant

## 2020-04-06 VITALS — BP 145/80 | HR 89 | Temp 97.1°F | Resp 16 | Wt 287.0 lb

## 2020-04-06 DIAGNOSIS — R1901 Right upper quadrant abdominal swelling, mass and lump: Secondary | ICD-10-CM

## 2020-04-06 DIAGNOSIS — T161XXA Foreign body in right ear, initial encounter: Secondary | ICD-10-CM

## 2020-04-06 DIAGNOSIS — H903 Sensorineural hearing loss, bilateral: Secondary | ICD-10-CM | POA: Diagnosis not present

## 2020-04-06 NOTE — Progress Notes (Signed)
Established patient visit   Patient: Justin Soto   DOB: [birth year]   59 y.o. Male  MRN: [id number] Visit Date: 04/06/2020  Today's healthcare provider: Mar Daring, PA-C   Chief Complaint  Patient presents with  . Otalgia   Subjective    Otalgia  There is pain in both (right ear worse) ears. This is a new problem. The current episode started in the past 7 days (since Thursday last week). The problem occurs constantly. The problem has been gradually worsening. There has been no fever. The pain is at a severity of 5/10 (Throbbing). The pain is moderate. Pertinent negatives include no abdominal pain, coughing, diarrhea, ear discharge, headaches, rhinorrhea, sore throat or vomiting. He has tried ear drops (for clogged ears and water but made worst) for the symptoms. The treatment provided no relief. His past medical history is significant for hearing loss.   Reports that he was having ear pain on Friday and it became severe enough he wanted to remove his hearing aids. He reports it was very painful to remove the right hearing aid. Since he has had progressive pain in the right ear. No drainage noted. No more than baseline hearing loss noted. His chronic tinnitus is increased.   He also has a new "bulge" in the RUQ of his abdomen. Noticed 2 weeks ago. Has been having to use abdominal muscles more and turn ways he normally wouldn't when using the bathroom since his back surgery that was done late last year. Currently not having pain. No change in BM.   Patient Active Problem List   Diagnosis Date Noted  . Postoperative wound infection 11/28/2019  . Lumbar stenosis with neurogenic claudication 10/23/2019  . Precordial pain 06/20/2019  . Trochanteric bursitis of right hip 11/01/2018  . Prostate cancer ([place]) 08/09/2018  . Umbilical hernia without obstruction and without gangrene 03/06/2018  . Kidney stone on right side 01/15/2017  . OSA (obstructive sleep apnea) 11/08/2016  .  Hyperglycemia 07/26/2016  . ADD (attention deficit disorder) 09/08/2015  . Absolute anemia 09/08/2015  . Achilles bursitis 09/08/2015  . Back pain, thoracic 09/08/2015  . Benign prostatic hypertrophy with lower urinary tract symptoms (LUTS) 09/08/2015  . Cellulitis 09/08/2015  . Clinical depression 09/08/2015  . Deep vein thrombosis ([place]) 09/08/2015  . Acid reflux 09/08/2015  . Bulge of cervical disc without myelopathy 09/08/2015  . 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 BP (high blood pressure) 09/08/2015  . Cannot sleep 09/08/2015  . Anemia, iron deficiency 09/08/2015  . Bad memory 09/08/2015  . Obesity, Class III, BMI 40-49.9 (morbid obesity) ([place]) 09/08/2015  . Allergic rhinitis, seasonal 09/08/2015  . 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 Fast heart beat 09/08/2015  . Cervical spondylosis with myelopathy and radiculopathy 11/13/2014  . Hernia, inguinal, unilateral 04/06/2009   Past Medical History:  Diagnosis Date  . Anemia   . Arthritis   . Dyspnea   . Enlarged prostate    hx of  . Environmental and seasonal allergies   . Frequency of urination   . GERD (gastroesophageal reflux disease)   . History of kidney stones   . HOH (hard of hearing)    wears bilateral hearing aids  . Hypertension   . Irregular heart beat    Pt feels this every once in a while; PCP aware but stated it was OK  . Kidney stone on right side 01/15/2017  . Leg pain, right  Take Lyrica and Naproxen  . Neck pain of over 3 months duration   . Sleep apnea   . Thalassemia   . Tick fever        Medications: Outpatient Medications Prior to Visit  Medication Sig  . amLODipine-benazepril (LOTREL) 10-40 MG capsule TAKE 1 CAPSULE BY MOUTH EVERY DAY  . aspirin EC 81 MG tablet Take 81 mg by mouth daily.  Marland Kitchen atorvastatin (LIPITOR) 40 MG tablet Take 40 mg by mouth daily.  . cetirizine (ZYRTEC) 10 MG tablet TAKE ONE TO TWO TABLETS BY MOUTH EVERY DAY AS  NEEDED. (Patient taking differently: Take 10 mg by mouth daily. )  . cyclobenzaprine (FLEXERIL) 10 MG tablet Take 1 tablet (10 mg total) by mouth 3 (three) times daily as needed for muscle spasms.  . diazepam (VALIUM) 5 MG tablet Take 10 mg by mouth at bedtime.  . docusate sodium (COLACE) 100 MG capsule Take 1 capsule (100 mg total) by mouth 2 (two) times daily. (Patient taking differently: Take 100 mg by mouth at bedtime as needed (if percocet was taken that day or previous day). )  . ferrous sulfate 325 (65 FE) MG tablet Take 1 tablet (325 mg total) by mouth 2 (two) times daily with a meal.  . finasteride (PROSCAR) 5 MG tablet Take 1 tablet (5 mg total) by mouth daily.  . fluticasone (FLONASE) 50 MCG/ACT nasal spray USE TWO SPRAYS IN EACH NOSTRIL EVERY DAY (Patient taking differently: Place 1 spray into both nostrils daily as needed for allergies or rhinitis (seasonal allergies). )  . gabapentin (NEURONTIN) 300 MG capsule Take 600 mg by mouth 2 (two) times daily.  . hydrochlorothiazide (HYDRODIURIL) 25 MG tablet TAKE 1 TABLET BY MOUTH EVERY DAY  . isosorbide mononitrate (IMDUR) 30 MG 24 hr tablet Take 30 mg by mouth daily.  . meloxicam (MOBIC) 15 MG tablet Take 15 mg by mouth daily.  . metoprolol tartrate (LOPRESSOR) 25 MG tablet TAKE 1 TABLET BY MOUTH EVERY DAY (Patient taking differently: Take 25 mg by mouth daily. )  . montelukast (SINGULAIR) 10 MG tablet TAKE 1 TABLET BY MOUTH EVERY DAY IN THE MORNING  . Multiple Vitamin (MULTIVITAMIN WITH MINERALS) TABS tablet Take 1 tablet by mouth daily.  . Omeprazole 20 MG TBEC Take 20 mg by mouth daily.   Marland Kitchen oxyCODONE-acetaminophen (PERCOCET) 10-325 MG tablet Take 0.5-1 tablets by mouth every 4 (four) hours as needed for pain.   . tamsulosin (FLOMAX) 0.4 MG CAPS capsule TAKE 2 CAPSULES BY MOUTH EVERY DAY AS DIRECTED  . doxycycline (VIBRA-TABS) 100 MG tablet Take 100 mg by mouth 2 (two) times daily.   No facility-administered medications prior to visit.      Review of Systems  Constitutional: Negative for fever.  HENT: Positive for congestion, ear pain, postnasal drip (" a little"), sneezing and tinnitus ("louder than usual" Wears hearing aids). Negative for ear discharge, rhinorrhea, sinus pressure, sinus pain and sore throat.   Respiratory: Negative for cough, chest tightness, shortness of breath and wheezing.   Cardiovascular: Negative for chest pain, palpitations and leg swelling.  Gastrointestinal: Negative for abdominal pain, constipation, diarrhea, nausea and vomiting.       Mass in RUQ  Neurological: Negative for headaches.    Last CBC Lab Results  Component Value Date   WBC 6.6 11/28/2019   HGB 8.1 (L) 11/28/2019   HCT 26.7 (L) 11/28/2019   MCV 63.7 (L) 11/28/2019   MCH 19.3 (L) 11/28/2019   RDW 17.2 (H) 11/28/2019  PLT 336 [id number]   Last metabolic panel Lab Results  Component Value Date   GLUCOSE 135 (H) 11/30/2019   NA 139 11/30/2019   K 3.2 (L) 11/30/2019   CL 103 11/30/2019   CO2 26 11/30/2019   BUN 15 11/30/2019   CREATININE 1.10 11/30/2019   GFRNONAA >60 11/30/2019   GFRAA >60 11/30/2019   CALCIUM 8.2 (L) 11/30/2019   PROT 7.3 08/10/2017   ALBUMIN 4.7 08/10/2017   LABGLOB 2.6 08/10/2017   AGRATIO 1.8 08/10/2017   BILITOT 0.6 08/10/2017   ALKPHOS 50 08/10/2017   AST 30 08/10/2017   ALT 28 08/10/2017   ANIONGAP 10 11/30/2019       Objective    BP (!) 145/80 (BP Location: Left Arm, Patient Position: Sitting, Cuff Size: Large)   Pulse 89   Temp (!) 97.1 F (36.2 C) (Temporal)   Resp 16   Wt 287 lb (130.2 kg)   BMI 46.32 kg/m  BP Readings from Last 3 Encounters:  04/06/20 (!) 145/80  03/05/20 (!) 150/101  12/02/19 138/80   Wt Readings from Last 3 Encounters:  04/06/20 287 lb (130.2 kg)  03/05/20 269 lb (122 kg)  11/28/19 264 lb 8.8 oz (120 kg)      Physical Exam Vitals reviewed.  Constitutional:      General: He is not in acute distress.    Appearance: Normal appearance. He is  well-developed. He is obese. He is not ill-appearing or diaphoretic.  HENT:     Head: Normocephalic and atraumatic.     Right Ear: Hearing and external ear normal. A foreign body (TM not well visualized due to foreign body; it is circular and black with a central opening. Through the central opening I can see fluid air bubbles) is present.     Left Ear: Hearing, ear canal and external ear normal.  No middle ear effusion. Tympanic membrane is scarred. Tympanic membrane is not erythematous or bulging.     Nose: Mucosal edema and rhinorrhea present.     Right Sinus: No maxillary sinus tenderness or frontal sinus tenderness.     Left Sinus: No maxillary sinus tenderness or frontal sinus tenderness.     Mouth/Throat:     Pharynx: Uvula midline. No oropharyngeal exudate or posterior oropharyngeal erythema.  Eyes:     General:        Right eye: No discharge.        Left eye: No discharge.     Conjunctiva/sclera: Conjunctivae normal.     Pupils: Pupils are equal, round, and reactive to light.  Neck:     Thyroid: No thyromegaly.     Trachea: No tracheal deviation.     Meningeal: Brudzinski's sign and Kernig's sign absent.  Cardiovascular:     Rate and Rhythm: Normal rate and regular rhythm.     Heart sounds: Normal heart sounds. No murmur. No friction rub. No gallop.   Pulmonary:     Effort: Pulmonary effort is normal. No respiratory distress.     Breath sounds: Normal breath sounds. No stridor. No wheezing or rales.  Abdominal:     General: Abdomen is protuberant. There is no distension or abdominal bruit.     Palpations: Abdomen is soft. There is mass.     Tenderness: There is no abdominal tenderness.     Hernia: A hernia is present. Hernia is present in the ventral area (suspected).    Musculoskeletal:     Cervical back: Normal range of motion and neck supple.  Lymphadenopathy:     Cervical: No cervical adenopathy.  Skin:    General: Skin is warm and dry.  Neurological:     Mental  Status: He is alert.       No results found for any visits on 04/06/20.  Assessment & Plan    1. Foreign body of right ear, initial encounter Referral placed and patient aware of appt today at 330.  - Ambulatory referral to ENT  2. Right upper quadrant abdominal mass New mass in RUQ following back surgery end of last year. Suspect ventral hernia. Will get [place] as below to better evaluate.  - US Abdomen Limited RUQ; Future   No follow-ups on file.      Reynolds Bowl, PA-C, have reviewed all documentation for this visit. The documentation on 04/06/20 for the exam, diagnosis, procedures, and orders are all accurate and complete.   Rubye Beach  [facility] [phone] (phone) [phone] (fax)  [place]

## 2020-04-10 ENCOUNTER — Telehealth: Payer: Self-pay

## 2020-04-10 NOTE — Telephone Encounter (Signed)
Called and no answer.

## 2020-04-10 NOTE — Telephone Encounter (Signed)
Copied from [place] [id number]. Topic: General - Other >> Apr 10, 2020 11:29 AM Jodie Echevaria wrote: Reason for CRM: Caryl Pina with [facility] called to speak to Fenton Malling about orders that was sent over for patient. Needing to get something taken care of before appointment on Monday 04/13/20 Please call Caryl Pina at Ph# [phone]

## 2020-04-13 ENCOUNTER — Other Ambulatory Visit: Payer: Self-pay

## 2020-04-13 ENCOUNTER — Telehealth: Payer: Self-pay

## 2020-04-13 ENCOUNTER — Ambulatory Visit
Admission: RE | Admit: 2020-04-13 | Discharge: 2020-04-13 | Disposition: A | Discharge status: Home / Self Care | Payer: BC Managed Care – PPO | Source: Ambulatory Visit | Attending: Physician Assistant | Admitting: Physician Assistant

## 2020-04-13 DIAGNOSIS — R1901 Right upper quadrant abdominal swelling, mass and lump: Secondary | ICD-10-CM | POA: Diagnosis not present

## 2020-04-13 NOTE — Telephone Encounter (Signed)
-----   Message from Mar Daring, [place] sent at 04/13/2020  5:09 PM EDT ----- No hernia noted. Most likely lipoma.

## 2020-04-13 NOTE — Telephone Encounter (Signed)
Patient advised as directed below. 

## 2020-04-21 DIAGNOSIS — M5412 Radiculopathy, cervical region: Secondary | ICD-10-CM | POA: Diagnosis not present

## 2020-04-21 DIAGNOSIS — Z9889 Other specified postprocedural states: Secondary | ICD-10-CM | POA: Diagnosis not present

## 2020-04-21 DIAGNOSIS — M48062 Spinal stenosis, lumbar region with neurogenic claudication: Secondary | ICD-10-CM | POA: Diagnosis not present

## 2020-04-21 DIAGNOSIS — Z981 Arthrodesis status: Secondary | ICD-10-CM | POA: Diagnosis not present

## 2020-04-21 DIAGNOSIS — M5416 Radiculopathy, lumbar region: Secondary | ICD-10-CM | POA: Diagnosis not present

## 2020-05-07 DIAGNOSIS — Z6825 Body mass index (BMI) 25.0-25.9, adult: Secondary | ICD-10-CM | POA: Diagnosis not present

## 2020-05-07 DIAGNOSIS — R030 Elevated blood-pressure reading, without diagnosis of hypertension: Secondary | ICD-10-CM | POA: Diagnosis not present

## 2020-05-07 DIAGNOSIS — R200 Anesthesia of skin: Secondary | ICD-10-CM | POA: Diagnosis not present

## 2020-05-07 DIAGNOSIS — M5412 Radiculopathy, cervical region: Secondary | ICD-10-CM | POA: Diagnosis not present

## 2020-05-14 ENCOUNTER — Other Ambulatory Visit: Payer: Self-pay | Admitting: Family Medicine

## 2020-05-14 ENCOUNTER — Other Ambulatory Visit: Payer: Self-pay | Admitting: Urology

## 2020-05-14 ENCOUNTER — Telehealth: Payer: Self-pay | Admitting: Urology

## 2020-05-14 NOTE — Telephone Encounter (Signed)
Would you please call Mr. Southards and get him scheduled for his prostate biopsy with Dr. Bernardo Heater?

## 2020-05-15 NOTE — Telephone Encounter (Signed)
LMOM for patient to return call and schedule Biopsy appointment with stoioff.

## 2020-06-09 ENCOUNTER — Other Ambulatory Visit: Payer: Self-pay | Admitting: Physician Assistant

## 2020-06-09 DIAGNOSIS — M5412 Radiculopathy, cervical region: Secondary | ICD-10-CM

## 2020-06-29 DIAGNOSIS — G4733 Obstructive sleep apnea (adult) (pediatric): Secondary | ICD-10-CM | POA: Diagnosis not present

## 2020-07-07 DIAGNOSIS — K219 Gastro-esophageal reflux disease without esophagitis: Secondary | ICD-10-CM | POA: Diagnosis not present

## 2020-07-07 DIAGNOSIS — J309 Allergic rhinitis, unspecified: Secondary | ICD-10-CM | POA: Diagnosis not present

## 2020-07-07 DIAGNOSIS — R070 Pain in throat: Secondary | ICD-10-CM | POA: Diagnosis not present

## 2020-07-10 DIAGNOSIS — M48062 Spinal stenosis, lumbar region with neurogenic claudication: Secondary | ICD-10-CM | POA: Diagnosis not present

## 2020-07-10 DIAGNOSIS — I10 Essential (primary) hypertension: Secondary | ICD-10-CM | POA: Diagnosis not present

## 2020-07-10 DIAGNOSIS — M5416 Radiculopathy, lumbar region: Secondary | ICD-10-CM | POA: Diagnosis not present

## 2020-07-10 DIAGNOSIS — Z6841 Body Mass Index (BMI) 40.0 and over, adult: Secondary | ICD-10-CM | POA: Diagnosis not present

## 2020-07-13 ENCOUNTER — Telehealth: Payer: Self-pay | Admitting: Physician Assistant

## 2020-07-13 NOTE — Telephone Encounter (Signed)
Justin Soto had a nerve block on 07/10/20  for his right leg.   He said he started feeling bad and checked his Blood sugar and it was up in the 300s.   It has come down to the 170s but he still feels bad. You dont have an appt till Friday. Is there anything he can do to help get the blood sugar down?  He said usually his blood sugars are between 100-125.

## 2020-07-13 NOTE — Telephone Encounter (Signed)
Patient advised as directed below. 

## 2020-07-13 NOTE — Telephone Encounter (Signed)
The nerve blocks normally have some steroid in them so I am sure that is why it is elevated. His blood sugars should continue to trend downward over the next few days. Definitely just continuing to limit carbohydrates and sugars the best he can until the sugars start to normalize.

## 2020-07-16 NOTE — Progress Notes (Signed)
Established patient visit   Patient: Justin Soto   DOB: 11/20/1961   59 y.o. Male  MRN: [id number] Visit Date: 07/17/2020  Today's healthcare provider: Mar Daring, PA-C   Chief Complaint  Patient presents with  . Hyperglycemia   Subjective    HPI  Patient here with c/o elevated blood sugar at home.He reports that last night it was 250 and this morning fasting it was 174. He has been having increase urination, thirst and feeling fatigue. He has been drinking lots of water. He has not had a pepsi or coke since November.  Has not had A1c checked by our office since 2018. In November prior to his back surgery he had an A1c checked by Dr. Arnoldo Morale that was elevated at 7.0. He never followed up on this at the time as he developed covid-19 post-op and also had infection of the hardware.  Since he has had steroid injections in his neck and had a nerve block on the right lower extremity 2 weeks ago. He has been checking his blood sugar over the last 2 weeks due to feeling poorly and they have been elevated, highest was over 300 right after the nerve block.   Patient Active Problem List   Diagnosis Date Noted  . Postoperative wound infection 11/28/2019  . Lumbar stenosis with neurogenic claudication 10/23/2019  . Precordial pain 06/20/2019  . Trochanteric bursitis of right hip 11/01/2018  . Prostate cancer ([place]) 08/09/2018  . Umbilical hernia without obstruction and without gangrene 03/06/2018  . Kidney stone on right side 01/15/2017  . OSA (obstructive sleep apnea) 11/08/2016  . Hyperglycemia 07/26/2016  . ADD (attention deficit disorder) 09/08/2015  . Absolute anemia 09/08/2015  . Achilles bursitis 09/08/2015  . Back pain, thoracic 09/08/2015  . Benign prostatic hypertrophy with lower urinary tract symptoms (LUTS) 09/08/2015  . Cellulitis 09/08/2015  . Clinical depression 09/08/2015  . Deep vein thrombosis ([place]) 09/08/2015  . Acid reflux 09/08/2015  . Bulge of  cervical disc without myelopathy 09/08/2015  . 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 BP (high blood pressure) 09/08/2015  . Cannot sleep 09/08/2015  . Anemia, iron deficiency 09/08/2015  . Bad memory 09/08/2015  . Obesity, Class III, BMI 40-49.9 (morbid obesity) ([place]) 09/08/2015  . Allergic rhinitis, seasonal 09/08/2015  . 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 Fast heart beat 09/08/2015  . Cervical spondylosis with myelopathy and radiculopathy 11/13/2014  . Hernia, inguinal, unilateral 04/06/2009   Past Medical History:  Diagnosis Date  . Anemia   . Arthritis   . Dyspnea   . Enlarged prostate    hx of  . Environmental and seasonal allergies   . Frequency of urination   . GERD (gastroesophageal reflux disease)   . History of kidney stones   . HOH (hard of hearing)    wears bilateral hearing aids  . Hypertension   . Irregular heart beat    Pt feels this every once in a while; PCP aware but stated it was OK  . Kidney stone on right side 01/15/2017  . Leg pain, right    Take Lyrica and Naproxen  . Neck pain of over 3 months duration   . Sleep apnea   . Thalassemia   . Tick fever        Medications: Outpatient Medications Prior to Visit  Medication Sig  . amLODipine-benazepril (LOTREL) 10-40 MG capsule TAKE 1 CAPSULE BY MOUTH EVERY DAY  .  aspirin EC 81 MG tablet Take 81 mg by mouth daily.  Marland Kitchen atorvastatin (LIPITOR) 40 MG tablet Take 40 mg by mouth daily.  . cetirizine (ZYRTEC) 10 MG tablet TAKE ONE TO TWO TABLETS BY MOUTH EVERY DAY AS NEEDED. (Patient taking differently: Take 10 mg by mouth daily. )  . cyclobenzaprine (FLEXERIL) 10 MG tablet Take 1 tablet (10 mg total) by mouth 3 (three) times daily as needed for muscle spasms.  . diazepam (VALIUM) 5 MG tablet Take 10 mg by mouth at bedtime.  . docusate sodium (COLACE) 100 MG capsule Take 1 capsule (100 mg total) by mouth 2 (two) times daily. (Patient taking differently:  Take 100 mg by mouth at bedtime as needed (if percocet was taken that day or previous day). )  . ferrous sulfate 325 (65 FE) MG tablet Take 1 tablet (325 mg total) by mouth 2 (two) times daily with a meal.  . finasteride (PROSCAR) 5 MG tablet TAKE 1 TABLET BY MOUTH EVERY DAY  . fluticasone (FLONASE) 50 MCG/ACT nasal spray USE TWO SPRAYS IN EACH NOSTRIL EVERY DAY (Patient taking differently: Place 1 spray into both nostrils daily as needed for allergies or rhinitis (seasonal allergies). )  . gabapentin (NEURONTIN) 300 MG capsule Take 600 mg by mouth 2 (two) times daily.  . hydrochlorothiazide (HYDRODIURIL) 25 MG tablet TAKE 1 TABLET BY MOUTH EVERY DAY  . isosorbide mononitrate (IMDUR) 30 MG 24 hr tablet Take 30 mg by mouth daily.  . meloxicam (MOBIC) 15 MG tablet Take 15 mg by mouth daily.  . metoprolol tartrate (LOPRESSOR) 25 MG tablet TAKE 1 TABLET BY MOUTH EVERY DAY  . montelukast (SINGULAIR) 10 MG tablet TAKE 1 TABLET BY MOUTH EVERY DAY IN THE MORNING  . Multiple Vitamin (MULTIVITAMIN WITH MINERALS) TABS tablet Take 1 tablet by mouth daily.  . Omeprazole 20 MG TBEC Take 20 mg by mouth daily.   Marland Kitchen oxyCODONE-acetaminophen (PERCOCET) 10-325 MG tablet Take 0.5-1 tablets by mouth every 4 (four) hours as needed for pain.   . tamsulosin (FLOMAX) 0.4 MG CAPS capsule TAKE 2 CAPSULES BY MOUTH EVERY DAY AS DIRECTED   No facility-administered medications prior to visit.    Review of Systems  Constitutional: Positive for fatigue.  Respiratory: Negative.   Cardiovascular: Negative.   Endocrine: Positive for polydipsia and polyuria.  Musculoskeletal: Positive for back pain.    Last CBC Lab Results  Component Value Date   WBC 6.6 11/28/2019   HGB 8.1 (L) 11/28/2019   HCT 26.7 (L) 11/28/2019   MCV 63.7 (L) 11/28/2019   MCH 19.3 (L) 11/28/2019   RDW 17.2 (H) 11/28/2019   PLT 336 99/37/1696   Last metabolic panel Lab Results  Component Value Date   GLUCOSE 135 (H) 11/30/2019   NA 139  11/30/2019   K 3.2 (L) 11/30/2019   CL 103 11/30/2019   CO2 26 11/30/2019   BUN 15 11/30/2019   CREATININE 1.10 11/30/2019   GFRNONAA >60 11/30/2019   GFRAA >60 11/30/2019   CALCIUM 8.2 (L) 11/30/2019   PROT 7.3 08/10/2017   ALBUMIN 4.7 08/10/2017   LABGLOB 2.6 08/10/2017   AGRATIO 1.8 08/10/2017   BILITOT 0.6 08/10/2017   ALKPHOS 50 08/10/2017   AST 30 08/10/2017   ALT 28 08/10/2017   ANIONGAP 10 11/30/2019      Objective    BP (!) 153/96 (BP Location: Left Arm, Patient Position: Sitting, Cuff Size: Large)   Pulse 90   Temp 99.1 F (37.3 C) (Oral)  Resp 16   Wt 292 lb 9.6 oz (132.7 kg)   BMI 47.23 kg/m  BP Readings from Last 3 Encounters:  07/17/20 (!) 153/96  04/06/20 (!) 145/80  03/05/20 (!) 150/101   Wt Readings from Last 3 Encounters:  07/17/20 292 lb 9.6 oz (132.7 kg)  04/06/20 287 lb (130.2 kg)  03/05/20 269 lb (122 kg)      Physical Exam Vitals reviewed.  Constitutional:      General: He is not in acute distress.    Appearance: Normal appearance. He is well-developed. He is obese. He is not ill-appearing or diaphoretic.  HENT:     Head: Normocephalic and atraumatic.  Eyes:     General: No scleral icterus. Cardiovascular:     Rate and Rhythm: Normal rate and regular rhythm.     Heart sounds: Normal heart sounds. No murmur heard.  No friction rub. No gallop.   Pulmonary:     Effort: Pulmonary effort is normal. No respiratory distress.     Breath sounds: Normal breath sounds. No wheezing or rales.  Musculoskeletal:     Cervical back: Normal range of motion and neck supple.  Neurological:     Mental Status: He is alert.       Results for orders placed or performed in visit on 07/17/20  POCT glycosylated hemoglobin (Hb A1C)  Result Value Ref Range   Hemoglobin A1C 7.4 (A) 4.0 - 5.6 %   Est. average glucose Bld gHb Est-mCnc 174     Assessment & Plan     1. Hyperglycemia A1c rechecked today and has increased from 7.0 8 months ago to now  7.4.   2. New onset type 2 diabetes mellitus ([place]) Today is second reading over 6.5. New T2DM. Start metformin as below. Pneumovax given without issue. Has glucometer at home already. Offered diabetic education class, but patient would like to hold off for now. F/U in 3 months.  - metFORMIN (GLUCOPHAGE) 500 MG tablet; Take 1 tablet (500 mg total) by mouth 2 (two) times daily with a meal.  Dispense: 180 tablet; Refill: 3 - Pneumococcal polysaccharide vaccine 23-valent greater than or equal to 2yo subcutaneous/IM  3. Need for pneumococcal vaccination Pneumococcal 23 Vaccine given to patient without complications. Patient sat for 15 minutes after administration and was tolerated well without adverse effects. - Pneumococcal polysaccharide vaccine 23-valent greater than or equal to 2yo subcutaneous/IM  No follow-ups on file.      Reynolds Bowl, PA-C, have reviewed all documentation for this visit. The documentation on 07/17/20 for the exam, diagnosis, procedures, and orders are all accurate and complete.   Rubye Beach  [facility] [phone] (phone) [phone] (fax)  [place]

## 2020-07-17 ENCOUNTER — Ambulatory Visit: Payer: BC Managed Care – PPO | Admitting: Physician Assistant

## 2020-07-17 ENCOUNTER — Encounter: Payer: Self-pay | Admitting: Physician Assistant

## 2020-07-17 ENCOUNTER — Other Ambulatory Visit: Payer: Self-pay

## 2020-07-17 VITALS — BP 153/96 | HR 90 | Temp 99.1°F | Resp 16 | Wt 292.6 lb

## 2020-07-17 DIAGNOSIS — Z23 Encounter for immunization: Secondary | ICD-10-CM

## 2020-07-17 DIAGNOSIS — E119 Type 2 diabetes mellitus without complications: Secondary | ICD-10-CM

## 2020-07-17 DIAGNOSIS — R739 Hyperglycemia, unspecified: Secondary | ICD-10-CM

## 2020-07-17 LAB — POCT GLYCOSYLATED HEMOGLOBIN (HGB A1C)
Est. average glucose Bld gHb Est-mCnc: 174
Hemoglobin A1C: 7.4 % — AB (ref 4.0–5.6)

## 2020-07-17 MED ORDER — METFORMIN HCL 500 MG PO TABS: 500.0000 mg | ORAL_TABLET | Freq: Two times a day (BID) | ORAL | 3 refills | Status: DC

## 2020-07-17 NOTE — Patient Instructions (Signed)
Type 2 Diabetes Mellitus, Diagnosis, Adult Type 2 diabetes (type 2 diabetes mellitus) is a long-term (chronic) disease. In type 2 diabetes, one or both of these problems may be present:  The pancreas does not make enough of a hormone called insulin.  Cells in the body do not respond properly to insulin that the body makes (insulin resistance). Normally, insulin allows blood sugar (glucose) to enter cells in the body. The cells use glucose for energy. Insulin resistance or lack of insulin causes excess glucose to build up in the blood instead of going into cells. As a result, high blood glucose (hyperglycemia) develops. What increases the risk? The following factors may make you more likely to develop type 2 diabetes:  Having a family member with type 2 diabetes.  Being overweight or obese.  Having an inactive (sedentary) lifestyle.  Having been diagnosed with insulin resistance.  Having a history of prediabetes, gestational diabetes, or polycystic ovary syndrome (PCOS).  Being of American-Indian, African-American, Hispanic/Latino, or Asian/Pacific Islander descent. What are the signs or symptoms? In the early stage of this condition, you may not have symptoms. Symptoms develop slowly and may include:  Increased thirst (polydipsia).  Increased hunger(polyphagia).  Increased urination (polyuria).  Increased urination during the night (nocturia).  Unexplained weight loss.  Frequent infections that keep coming back (recurring).  Fatigue.  Weakness.  Vision changes, such as blurry vision.  Cuts or bruises that are slow to heal.  Tingling or numbness in the hands or feet.  Dark patches on the skin (acanthosis nigricans). How is this diagnosed? This condition is diagnosed based on your symptoms, your medical history, a physical exam, and your blood glucose level. Your blood glucose may be checked with one or more of the following blood tests:  A fasting blood glucose (FBG)  test. You will not be allowed to eat (you will fast) for 8 hours or longer before a blood sample is taken.  A random blood glucose test. This test checks blood glucose at any time of day regardless of when you ate.  An A1c (hemoglobin A1c) blood test. This test provides information about blood glucose control over the previous 2-3 months.  An oral glucose tolerance test (OGTT). This test measures your blood glucose at two times: ? After fasting. This is your baseline blood glucose level. ? Two hours after drinking a beverage that contains glucose. You may be diagnosed with type 2 diabetes if:  Your FBG level is 126 mg/dL (7.0 mmol/L) or higher.  Your random blood glucose level is 200 mg/dL (11.1 mmol/L) or higher.  Your A1c level is 6.5% or higher.  Your OGTT result is higher than 200 mg/dL (11.1 mmol/L). These blood tests may be repeated to confirm your diagnosis. How is this treated? Your treatment may be managed by a specialist called an endocrinologist. Type 2 diabetes may be treated by following instructions from your health care provider about:  Making diet and lifestyle changes. This may include: ? Following an individualized nutrition plan that is developed by a diet and nutrition specialist (registered dietitian). ? Exercising regularly. ? Finding ways to manage stress.  Checking your blood glucose level as often as told.  Taking diabetes medicines or insulin daily. This helps to keep your blood glucose levels in the healthy range. ? If you use insulin, you may need to adjust the dosage depending on how physically active you are and what foods you eat. Your health care provider will tell you how to adjust your dosage.    Taking medicines to help prevent complications from diabetes, such as: ? Aspirin. ? Medicine to lower cholesterol. ? Medicine to control blood pressure. Your health care provider will set individualized treatment goals for you. Your goals will be based on  your age, other medical conditions you have, and how you respond to diabetes treatment. Generally, the goal of treatment is to maintain the following blood glucose levels:  Before meals (preprandial): 80-130 mg/dL (4.4-7.2 mmol/L).  After meals (postprandial): below 180 mg/dL (10 mmol/L).  A1c level: less than 7%. Follow these instructions at home: Questions to ask your health care provider  Consider asking the following questions: ? Do I need to meet with a diabetes educator? ? Where can I find a support group for people with diabetes? ? What equipment will I need to manage my diabetes at home? ? What diabetes medicines do I need, and when should I take them? ? How often do I need to check my blood glucose? ? What number can I call if I have questions? ? When is my next appointment? General instructions  Take over-the-counter and prescription medicines only as told by your health care provider.  Keep all follow-up visits as told by your health care provider. This is important.  For more information about diabetes, visit: ? American Diabetes Association (ADA): www.diabetes.org ? American Association of Diabetes Educators (AADE): www.diabeteseducator.org Contact a health care provider if:  Your blood glucose is at or above 240 mg/dL (13.3 mmol/L) for 2 days in a row.  You have been sick or have had a fever for 2 days or longer, and you are not getting better.  You have any of the following problems for more than 6 hours: ? You cannot eat or drink. ? You have nausea and vomiting. ? You have diarrhea. Get help right away if:  Your blood glucose is lower than 54 mg/dL (3.0 mmol/L).  You become confused or you have trouble thinking clearly.  You have difficulty breathing.  You have moderate or large ketone levels in your urine. Summary  Type 2 diabetes (type 2 diabetes mellitus) is a long-term (chronic) disease. In type 2 diabetes, the pancreas does not make enough of a  hormone called insulin, or cells in the body do not respond properly to insulin that the body makes (insulin resistance).  This condition is treated by making diet and lifestyle changes and taking diabetes medicines or insulin.  Your health care provider will set individualized treatment goals for you. Your goals will be based on your age, other medical conditions you have, and how you respond to diabetes treatment.  Keep all follow-up visits as told by your health care provider. This is important. This information is not intended to replace advice given to you by your health care provider. Make sure you discuss any questions you have with your health care provider. Document Revised: 01/26/2018 Document Reviewed: 01/01/2016 Elsevier Patient Education  2020 Elsevier Inc.  

## 2020-07-19 ENCOUNTER — Telehealth: Payer: Self-pay | Admitting: Urology

## 2020-07-19 NOTE — Telephone Encounter (Signed)
It has been one year since Justin Soto has had his prostate MRI.  I recommend that we repeat his PSA and prostate MRI prior to scheduling another prostate biopsy.

## 2020-07-20 ENCOUNTER — Other Ambulatory Visit: Payer: Self-pay

## 2020-07-20 DIAGNOSIS — C61 Malignant neoplasm of prostate: Secondary | ICD-10-CM

## 2020-07-20 NOTE — Telephone Encounter (Signed)
Patient notified and PSA has been scheduled. He is willing to do the MRI.

## 2020-07-20 NOTE — Telephone Encounter (Signed)
Okay.  I will wait till I receive the PSA results and then I will order the prostate MRI.

## 2020-07-21 ENCOUNTER — Other Ambulatory Visit: Payer: Self-pay

## 2020-07-22 ENCOUNTER — Other Ambulatory Visit: Payer: Self-pay

## 2020-07-22 ENCOUNTER — Other Ambulatory Visit: Payer: BC Managed Care – PPO

## 2020-07-22 DIAGNOSIS — C61 Malignant neoplasm of prostate: Secondary | ICD-10-CM | POA: Diagnosis not present

## 2020-07-23 ENCOUNTER — Telehealth: Payer: Self-pay | Admitting: Family Medicine

## 2020-07-23 ENCOUNTER — Other Ambulatory Visit: Payer: Self-pay | Admitting: Urology

## 2020-07-23 DIAGNOSIS — C61 Malignant neoplasm of prostate: Secondary | ICD-10-CM

## 2020-07-23 LAB — PSA: Prostate Specific Ag, Serum: 1.4 ng/mL (ref 0.0–4.0)

## 2020-07-23 NOTE — Telephone Encounter (Signed)
Patient notified and voiced understanding.

## 2020-07-23 NOTE — Telephone Encounter (Signed)
-----   Message from Nori Riis, PA-C sent at 07/23/2020  8:42 AM EDT ----- Please let Mr. Petrosky know that his PSA is stable, so we will go ahead with the prostate MRI.  I have placed the orders.

## 2020-07-27 ENCOUNTER — Encounter: Payer: Self-pay | Admitting: Physician Assistant

## 2020-07-30 MED ORDER — DAPAGLIFLOZIN PROPANEDIOL 10 MG PO TABS: 10.0000 mg | ORAL_TABLET | Freq: Every day | ORAL | 2 refills | Status: DC

## 2020-08-05 DIAGNOSIS — R070 Pain in throat: Secondary | ICD-10-CM | POA: Diagnosis not present

## 2020-08-05 DIAGNOSIS — K219 Gastro-esophageal reflux disease without esophagitis: Secondary | ICD-10-CM | POA: Diagnosis not present

## 2020-08-11 ENCOUNTER — Other Ambulatory Visit: Payer: Self-pay | Admitting: Family Medicine

## 2020-08-11 NOTE — Telephone Encounter (Signed)
Requested Prescriptions  Pending Prescriptions Disp Refills  . metoprolol tartrate (LOPRESSOR) 25 MG tablet [Pharmacy Med Name: METOPROLOL TARTRATE 25 MG TAB] 90 tablet 0    Sig: TAKE 1 TABLET BY MOUTH EVERY DAY     Cardiovascular:  Beta Blockers Failed - 08/11/2020  1:54 AM      Failed - Last BP in normal range    BP Readings from Last 1 Encounters:  07/17/20 (!) 153/96         Passed - Last Heart Rate in normal range    Pulse Readings from Last 1 Encounters:  07/17/20 90         Passed - Valid encounter within last 6 months    Recent Outpatient Visits          3 weeks ago Hyperglycemia   [place], [place]   4 months ago Foreign body of right ear, initial encounter   [place], [place]   9 months ago Postoperative fever   [facility], Dionne Bucy, MD   1 year ago SOB (shortness of breath)   [facility], Clearnce Sorrel, [place]   1 year ago Acute non-recurrent pansinusitis   [place], [place]      Future Appointments            In 2 months Marlyn Corporal, Clearnce Sorrel, PA-C [facility], [facility]           Patient was seen by provider on 07/17/2020.

## 2020-08-25 DIAGNOSIS — M5416 Radiculopathy, lumbar region: Secondary | ICD-10-CM | POA: Diagnosis not present

## 2020-09-07 ENCOUNTER — Other Ambulatory Visit: Payer: Self-pay

## 2020-09-07 ENCOUNTER — Telehealth: Payer: Self-pay | Admitting: Family Medicine

## 2020-09-07 ENCOUNTER — Ambulatory Visit
Admission: RE | Admit: 2020-09-07 | Discharge: 2020-09-07 | Disposition: A | Discharge status: Home / Self Care | Payer: BC Managed Care – PPO | Source: Ambulatory Visit | Attending: Urology | Admitting: Urology

## 2020-09-07 DIAGNOSIS — C61 Malignant neoplasm of prostate: Secondary | ICD-10-CM | POA: Diagnosis not present

## 2020-09-07 MED ORDER — GADOBUTROL 1 MMOL/ML IV SOLN
10.0000 mL | Freq: Once | INTRAVENOUS | Status: AC | PRN
  Administered 2020-09-07: 10 mL via INTRAVENOUS

## 2020-09-07 NOTE — Telephone Encounter (Signed)
-----   Message from Nori Riis, PA-C sent at 09/07/2020 10:28 AM EDT ----- Please let Justin Soto know that his prostate MRI did not show any signs of prostate cancer, but as prostate mri can miss prostate cancer in about 25% of men, I would like to get Dr. Dene Gentry opinion on whether or not to undergo a biopsy at this time for confirmation.  He will be out this week, so I will speak with him next week and get back with Justin Soto.

## 2020-09-07 NOTE — Telephone Encounter (Signed)
Patient notified and voiced understanding.

## 2020-09-23 DIAGNOSIS — M5416 Radiculopathy, lumbar region: Secondary | ICD-10-CM | POA: Diagnosis not present

## 2020-09-23 DIAGNOSIS — M48062 Spinal stenosis, lumbar region with neurogenic claudication: Secondary | ICD-10-CM | POA: Diagnosis not present

## 2020-09-28 DIAGNOSIS — G4733 Obstructive sleep apnea (adult) (pediatric): Secondary | ICD-10-CM | POA: Diagnosis not present

## 2020-10-02 DIAGNOSIS — E782 Mixed hyperlipidemia: Secondary | ICD-10-CM | POA: Diagnosis not present

## 2020-10-12 NOTE — Telephone Encounter (Signed)
I sent Mr. Quevedo a MyChart messaged stating that Dr. Bernardo Heater would like for him to have the prostate biopsy.  If Mr. Capshaw finds that it is too uncomfortable to do in the office, Dr. Bernardo Heater will schedule the biopsy in the OR.  Has he made a decision as to whether he will have the biopsy in the office or in the OR?

## 2020-10-12 NOTE — Telephone Encounter (Signed)
Spoke to patient and an appointment for the Biopsy is scheduled. Instructions and prep and appointment reminder has been mailed.

## 2020-10-16 NOTE — Progress Notes (Signed)
Established patient visit   Patient: Justin Soto   DOB: 1961-08-04   59 y.o. Male  MRN: [id number] Visit Date: 10/19/2020  Today's healthcare provider: Mar Daring, PA-C   Chief Complaint  Patient presents with  . Follow-up   Subjective    HPI Patient declined influenza vaccine today. He took the [place] booster last Thursday.   Diabetes Mellitus Type II, Follow-up  Lab Results  Component Value Date   HGBA1C 6.3 (A) 10/19/2020   HGBA1C 7.4 (A) 07/17/2020   HGBA1C 7.0 (H) 10/24/2019   Wt Readings from Last 3 Encounters:  10/19/20 272 lb (123.4 kg)  07/17/20 292 lb 9.6 oz (132.7 kg)  04/06/20 287 lb (130.2 kg)   Last seen for diabetes 3 months ago.  Management since then includes start Metformin.Reports that he didn't do good on the Metformin and Wilder Glade he is also has stopped the medicine. He is currently with [place] program to help diabetics. He reports excellent compliance with treatment. He is not having side effects.  Symptoms: No fatigue No foot ulcerations  No appetite changes No nausea  No paresthesia of the feet  No polydipsia  No polyuria No visual disturbances   No vomiting     Home blood sugar records: fasting range: 100's-120's  Episodes of hypoglycemia? No    Current insulin regiment: none Most Recent Eye Exam: not UTD Current exercise: walking Current diet habits: diabetic, low carb  Pertinent Labs: Lab Results  Component Value Date   CHOL 117 08/10/2017   HDL 26 (L) 08/10/2017   LDLCALC 44 08/10/2017   TRIG 233 (H) 08/10/2017   Lab Results  Component Value Date   NA 139 11/30/2019   K 3.2 (L) 11/30/2019   CREATININE 1.10 11/30/2019   GFRNONAA >60 11/30/2019   GFRAA >60 11/30/2019   GLUCOSE 135 (H) 11/30/2019     ---------------------------------------------------------------------------------------------------  Patient Active Problem List   Diagnosis Date Noted  . Type 2 diabetes mellitus without complication, without  long-term current use of insulin ([place]) 10/19/2020  . Postoperative wound infection 11/28/2019  . Lumbar stenosis with neurogenic claudication 10/23/2019  . Precordial pain 06/20/2019  . Trochanteric bursitis of right hip 11/01/2018  . Prostate cancer ([place]) 08/09/2018  . Umbilical hernia without obstruction and without gangrene 03/06/2018  . Kidney stone on right side 01/15/2017  . OSA (obstructive sleep apnea) 11/08/2016  . Hyperglycemia 07/26/2016  . ADD (attention deficit disorder) 09/08/2015  . Absolute anemia 09/08/2015  . Achilles bursitis 09/08/2015  . Back pain, thoracic 09/08/2015  . Benign prostatic hypertrophy with lower urinary tract symptoms (LUTS) 09/08/2015  . Cellulitis 09/08/2015  . Clinical depression 09/08/2015  . Acid reflux 09/08/2015  . Bulge of cervical disc without myelopathy 09/08/2015  . 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 BP (high blood pressure) 09/08/2015  . Cannot sleep 09/08/2015  . Anemia, iron deficiency 09/08/2015  . Bad memory 09/08/2015  . Obesity, Class III, BMI 40-49.9 (morbid obesity) ([place]) 09/08/2015  . Allergic rhinitis, seasonal 09/08/2015  . 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 Fast heart beat 09/08/2015  . Cervical spondylosis with myelopathy and radiculopathy 11/13/2014  . Hernia, inguinal, unilateral 04/06/2009   Past Medical History:  Diagnosis Date  . Anemia   . Arthritis   . Dyspnea   . Enlarged prostate    hx of  . Environmental and seasonal allergies   . Frequency of urination   . GERD (gastroesophageal  reflux disease)   . History of kidney stones   . HOH (hard of hearing)    wears bilateral hearing aids  . Hypertension   . Irregular heart beat    Pt feels this every once in a while; PCP aware but stated it was OK  . Kidney stone on right side 01/15/2017  . Leg pain, right    Take Lyrica and Naproxen  . Neck pain of over 3 months duration   . Sleep apnea   .  Thalassemia   . Tick fever        Medications: Outpatient Medications Prior to Visit  Medication Sig  . amLODipine-benazepril (LOTREL) 10-40 MG capsule TAKE 1 CAPSULE BY MOUTH EVERY DAY  . aspirin EC 81 MG tablet Take 81 mg by mouth daily.  Marland Kitchen atorvastatin (LIPITOR) 40 MG tablet Take 40 mg by mouth daily.  . cetirizine (ZYRTEC) 10 MG tablet TAKE ONE TO TWO TABLETS BY MOUTH EVERY DAY AS NEEDED. (Patient taking differently: Take 10 mg by mouth daily. )  . cyclobenzaprine (FLEXERIL) 10 MG tablet Take 1 tablet (10 mg total) by mouth 3 (three) times daily as needed for muscle spasms.  Marland Kitchen docusate sodium (COLACE) 100 MG capsule Take 1 capsule (100 mg total) by mouth 2 (two) times daily. (Patient taking differently: Take 100 mg by mouth at bedtime as needed (if percocet was taken that day or previous day). )  . ferrous sulfate 325 (65 FE) MG tablet Take 1 tablet (325 mg total) by mouth 2 (two) times daily with a meal.  . finasteride (PROSCAR) 5 MG tablet TAKE 1 TABLET BY MOUTH EVERY DAY  . fluticasone (FLONASE) 50 MCG/ACT nasal spray USE TWO SPRAYS IN EACH NOSTRIL EVERY DAY (Patient taking differently: Place 1 spray into both nostrils daily as needed for allergies or rhinitis (seasonal allergies). )  . gabapentin (NEURONTIN) 300 MG capsule Take 600 mg by mouth 2 (two) times daily.  . hydrochlorothiazide (HYDRODIURIL) 25 MG tablet TAKE 1 TABLET BY MOUTH EVERY DAY  . isosorbide mononitrate (IMDUR) 30 MG 24 hr tablet Take 30 mg by mouth daily.  . meloxicam (MOBIC) 15 MG tablet Take 15 mg by mouth daily.  . metoprolol tartrate (LOPRESSOR) 25 MG tablet TAKE 1 TABLET BY MOUTH EVERY DAY  . montelukast (SINGULAIR) 10 MG tablet TAKE 1 TABLET BY MOUTH EVERY DAY IN THE MORNING  . Multiple Vitamin (MULTIVITAMIN WITH MINERALS) TABS tablet Take 1 tablet by mouth daily.  . Omeprazole 20 MG TBEC Take 20 mg by mouth daily.   Marland Kitchen oxyCODONE-acetaminophen (PERCOCET) 10-325 MG tablet Take 0.5-1 tablets by mouth every 4  (four) hours as needed for pain.   . tamsulosin (FLOMAX) 0.4 MG CAPS capsule TAKE 2 CAPSULES BY MOUTH EVERY DAY AS DIRECTED  . [DISCONTINUED] FARXIGA 10 MG TABS tablet TAKE 1 TABLET (10 MG TOTAL) BY MOUTH DAILY BEFORE BREAKFAST.  . [DISCONTINUED] diazepam (VALIUM) 5 MG tablet Take 10 mg by mouth at bedtime.   No facility-administered medications prior to visit.    Review of Systems  Constitutional: Negative.   Respiratory: Negative.   Cardiovascular: Negative.   Endocrine: Negative.   Neurological: Negative.     Last CBC Lab Results  Component Value Date   WBC 6.6 11/28/2019   HGB 8.1 (L) 11/28/2019   HCT 26.7 (L) 11/28/2019   MCV 63.7 (L) 11/28/2019   MCH 19.3 (L) 11/28/2019   RDW 17.2 (H) 11/28/2019   PLT 336 15/40/0867   Last metabolic panel Lab  Results  Component Value Date   GLUCOSE 135 (H) 11/30/2019   NA 139 11/30/2019   K 3.2 (L) 11/30/2019   CL 103 11/30/2019   CO2 26 11/30/2019   BUN 15 11/30/2019   CREATININE 1.10 11/30/2019   GFRNONAA >60 11/30/2019   GFRAA >60 11/30/2019   CALCIUM 8.2 (L) 11/30/2019   PROT 7.3 08/10/2017   ALBUMIN 4.7 08/10/2017   LABGLOB 2.6 08/10/2017   AGRATIO 1.8 08/10/2017   BILITOT 0.6 08/10/2017   ALKPHOS 50 08/10/2017   AST 30 08/10/2017   ALT 28 08/10/2017   ANIONGAP 10 11/30/2019      Objective    BP (!) 152/82 (BP Location: Left Arm, Patient Position: Sitting, Cuff Size: Large)   Pulse 99   Resp 16   Ht 5\' 6"  (1.676 m)   Wt 272 lb (123.4 kg)   BMI 43.90 kg/m  BP Readings from Last 3 Encounters:  10/19/20 (!) 152/82  07/17/20 (!) 153/96  04/06/20 (!) 145/80   Wt Readings from Last 3 Encounters:  10/19/20 272 lb (123.4 kg)  07/17/20 292 lb 9.6 oz (132.7 kg)  04/06/20 287 lb (130.2 kg)      Physical Exam Vitals reviewed.  Constitutional:      General: He is not in acute distress.    Appearance: Normal appearance. He is well-developed and well-groomed. He is obese. He is not diaphoretic.  HENT:      Head: Normocephalic and atraumatic.  Cardiovascular:     Rate and Rhythm: Normal rate and regular rhythm.     Heart sounds: Normal heart sounds. No murmur heard.  No friction rub. No gallop.   Pulmonary:     Effort: Pulmonary effort is normal. No respiratory distress.     Breath sounds: Normal breath sounds. No wheezing or rales.  Musculoskeletal:     Cervical back: Normal range of motion and neck supple.  Neurological:     Mental Status: He is alert.  Psychiatric:        Behavior: Behavior is cooperative.       Results for orders placed or performed in visit on 10/19/20  POCT glycosylated hemoglobin (Hb A1C)  Result Value Ref Range   Hemoglobin A1C 6.3 (A) 4.0 - 5.6 %   Est. average glucose Bld gHb Est-mCnc 134     Assessment & Plan     1. Type 2 diabetes mellitus without complication, without long-term current use of insulin ([place]) Patient is doing very well. Off all medication for diabetes at this time and A1c continues to lower. Down to 6.3 now. Doing lifestyle program called Michaell Cowing through his insurance.   2. Class 3 severe obesity due to excess calories with serious comorbidity and body mass index (BMI) of 40.0 to 44.9 in adult [facility]) Doing well. Has lost from 300 pounds last August to now 272 pounds. Encouraged to keep up the good work.   Return in about 3 months (around 01/19/2021) for CPE.      Reynolds Bowl, PA-C, have reviewed all documentation for this visit. The documentation on 10/19/20 for the exam, diagnosis, procedures, and orders are all accurate and complete.   [place]  [facility] [phone] (phone) [phone] (fax)  [place]

## 2020-10-18 ENCOUNTER — Other Ambulatory Visit: Payer: Self-pay | Admitting: Physician Assistant

## 2020-10-18 NOTE — Telephone Encounter (Signed)
Requested Prescriptions  Pending Prescriptions Disp Refills  . FARXIGA 10 MG TABS tablet [Pharmacy Med Name: FARXIGA 10 MG TABLET] 90 tablet 0    Sig: TAKE 1 TABLET (10 MG TOTAL) BY MOUTH DAILY BEFORE BREAKFAST.     Endocrinology:  Diabetes - SGLT2 Inhibitors Failed - 10/18/2020 12:49 AM      Failed - LDL in normal range and within 360 days    LDL Calculated  Date Value Ref Range Status  08/10/2017 44 0 - 99 mg/dL Final         Passed - Cr in normal range and within 360 days    Creatinine  Date Value Ref Range Status  08/11/2014 1.01 0.60 - 1.30 mg/dL Final   Creatinine, Ser  Date Value Ref Range Status  11/30/2019 1.10 0.61 - 1.24 mg/dL Final         Passed - HBA1C is between 0 and 7.9 and within 180 days    Hemoglobin A1C  Date Value Ref Range Status  07/17/2020 7.4 (A) 4.0 - 5.6 % Final   Hgb A1c MFr Bld  Date Value Ref Range Status  10/24/2019 7.0 (H) 4.8 - 5.6 % Final    Comment:    (NOTE) Pre diabetes:          5.7%-6.4% Diabetes:              >6.4% Glycemic control for   <7.0% adults with diabetes          Passed - eGFR in normal range and within 360 days    EGFR (African American)  Date Value Ref Range Status  08/11/2014 >60  Final   GFR calc Af Amer  Date Value Ref Range Status  11/30/2019 >60 >60 mL/min Final   EGFR (Non-African Amer.)  Date Value Ref Range Status  08/11/2014 >60  Final    Comment:    eGFR values <50m/min/1.73 m2 may be an indication of chronic kidney disease (CKD). Calculated eGFR is useful in patients with stable renal function. The eGFR calculation will not be reliable in acutely ill patients when serum creatinine is changing rapidly. It is not useful in  patients on dialysis. The eGFR calculation may not be applicable to patients at the low and high extremes of body sizes, pregnant women, and vegetarians.    GFR calc non Af Amer  Date Value Ref Range Status  11/30/2019 >60 >60 mL/min Final         Passed - Valid  encounter within last 6 months    Recent Outpatient Visits          [place]  6 months ago Foreign body of right ear, initial encounter   [facility] [place]  11 months ago [facility]   1 year ago [place] (shortness of breath)   [place]  1 year ago Acute non-recurrent pansinusitis   [facility] [place] [place]     Future Appointments            Tomorrow BMarlyn Corporal [facility]          Patient had normal LDL value in Care Everywhere.  LDL performed on 10/02/2020.

## 2020-10-19 ENCOUNTER — Encounter: Payer: Self-pay | Admitting: Physician Assistant

## 2020-10-19 ENCOUNTER — Ambulatory Visit: Payer: BC Managed Care – PPO | Admitting: Physician Assistant

## 2020-10-19 ENCOUNTER — Other Ambulatory Visit: Payer: Self-pay

## 2020-10-19 VITALS — BP 152/82 | HR 99 | Resp 16 | Ht 66.0 in | Wt 272.0 lb

## 2020-10-19 DIAGNOSIS — Z6841 Body Mass Index (BMI) 40.0 and over, adult: Secondary | ICD-10-CM | POA: Diagnosis not present

## 2020-10-19 DIAGNOSIS — E782 Mixed hyperlipidemia: Secondary | ICD-10-CM | POA: Diagnosis not present

## 2020-10-19 DIAGNOSIS — E118 Type 2 diabetes mellitus with unspecified complications: Secondary | ICD-10-CM | POA: Insufficient documentation

## 2020-10-19 DIAGNOSIS — E1165 Type 2 diabetes mellitus with hyperglycemia: Secondary | ICD-10-CM | POA: Insufficient documentation

## 2020-10-19 DIAGNOSIS — M5431 Sciatica, right side: Secondary | ICD-10-CM | POA: Diagnosis not present

## 2020-10-19 DIAGNOSIS — E119 Type 2 diabetes mellitus without complications: Secondary | ICD-10-CM

## 2020-10-19 DIAGNOSIS — I10 Essential (primary) hypertension: Secondary | ICD-10-CM | POA: Diagnosis not present

## 2020-10-19 LAB — POCT GLYCOSYLATED HEMOGLOBIN (HGB A1C)
Est. average glucose Bld gHb Est-mCnc: 134
Hemoglobin A1C: 6.3 % — AB (ref 4.0–5.6)

## 2020-10-22 ENCOUNTER — Other Ambulatory Visit: Payer: Self-pay | Admitting: Neurosurgery

## 2020-10-22 DIAGNOSIS — M5431 Sciatica, right side: Secondary | ICD-10-CM

## 2020-10-22 DIAGNOSIS — E118 Type 2 diabetes mellitus with unspecified complications: Secondary | ICD-10-CM | POA: Diagnosis not present

## 2020-10-29 DIAGNOSIS — K219 Gastro-esophageal reflux disease without esophagitis: Secondary | ICD-10-CM | POA: Diagnosis not present

## 2020-10-29 DIAGNOSIS — D380 Neoplasm of uncertain behavior of larynx: Secondary | ICD-10-CM | POA: Diagnosis not present

## 2020-10-29 DIAGNOSIS — R070 Pain in throat: Secondary | ICD-10-CM | POA: Diagnosis not present

## 2020-11-12 ENCOUNTER — Other Ambulatory Visit: Payer: Self-pay

## 2020-11-12 ENCOUNTER — Other Ambulatory Visit
Admission: RE | Admit: 2020-11-12 | Discharge: 2020-11-12 | Disposition: A | Discharge status: Home / Self Care | Payer: BC Managed Care – PPO | Source: Ambulatory Visit | Attending: Unknown Physician Specialty | Admitting: Unknown Physician Specialty

## 2020-11-12 HISTORY — DX: Type 2 diabetes mellitus without complications: E11.9

## 2020-11-12 HISTORY — DX: Malignant neoplasm of prostate: C61

## 2020-11-12 HISTORY — DX: Carrier or suspected carrier of methicillin resistant Staphylococcus aureus: Z22.322

## 2020-11-12 NOTE — Patient Instructions (Addendum)
Your procedure is scheduled on:  Tuesday, December 14 Report to the Registration Desk on the 1st floor of the [facility]. To find out your arrival time, please call [phone] between 1PM - 3PM on: Monday, December 13  REMEMBER: Instructions that are not followed completely may result in serious medical risk, up to and including death; or upon the discretion of your surgeon and anesthesiologist your surgery may need to be rescheduled.  Do not eat food after midnight the night before surgery.  No gum chewing, lozengers or hard candies.  You may however, drink water up to 2 hours before you are scheduled to arrive for your surgery. Do not drink anything within 2 hours of your scheduled arrival time.  TAKE THESE MEDICATIONS THE MORNING OF SURGERY WITH A SIP OF WATER:  1.  Finasteride 2.  Gabapentin 3.  Isosorbide mononitrate 4.  Metoprolol 5.  Omeprazole (Prilosec) - (take one the night before and one on the morning of surgery - helps to prevent nausea after surgery.) 6.  Tamsulosin (Flomax) 7.  Flonase nasal spray  Follow recommendations from Cardiologist regarding stopping Aspirin. Stop 4 days prior to procedure per Dr. Nehemiah Massed (last day to take is December 9; resume after surgery per surgeon)  One week prior to surgery: Stop MELOXICAM, Anti-inflammatories (NSAIDS) such as Advil, Aleve, Ibuprofen, Motrin, Naproxen, Naprosyn and Aspirin based products such as Excedrin, Goodys Powder, BC Powder. Stop ANY OVER THE COUNTER supplements until after surgery. (However, you may continue taking multivitamin up until the day before surgery.)  No Alcohol for 24 hours before or after surgery.  No Smoking including e-cigarettes for 24 hours prior to surgery.  No chewable tobacco products for at least 6 hours prior to surgery.  No nicotine patches on the day of surgery.  Do not use any "recreational" drugs for at least a week prior to your surgery.  Please be advised that the combination  of cocaine and anesthesia may have negative outcomes, up to and including death. If you test positive for cocaine, your surgery will be cancelled.  On the morning of surgery brush your teeth with toothpaste and water, you may rinse your mouth with mouthwash if you wish. Do not swallow any toothpaste or mouthwash.  Do not wear jewelry, make-up, hairpins, clips or nail polish.  Do not wear lotions, powders, or perfumes.   Contact lenses, hearing aids and dentures may not be worn into surgery.  Do not bring valuables to the hospital. [facility] is not responsible for any missing/lost belongings or valuables.   Bring your C-PAP to the hospital with you in case you may have to spend the night.   Notify your doctor if there is any change in your medical condition (cold, fever, infection).  If you are being discharged the day of surgery, you will not be allowed to drive home. You will need a responsible adult (18 years or older) to drive you home and stay with you that night.   If you are taking public transportation, you will need to have a responsible adult (18 years or older) with you. Please confirm with your physician that it is acceptable to use public transportation.   Please call the [facility] at [phone] if you have any questions about these instructions.  Visitation Policy:  Patients undergoing a surgery or procedure may have one family member or support person with them as long as that person is not COVID-19 positive or experiencing its symptoms.  That  person may remain in the waiting area during the procedure.

## 2020-11-14 ENCOUNTER — Ambulatory Visit
Admission: RE | Admit: 2020-11-14 | Discharge: 2020-11-14 | Disposition: A | Discharge status: Home / Self Care | Payer: BC Managed Care – PPO | Source: Ambulatory Visit | Attending: Neurosurgery | Admitting: Neurosurgery

## 2020-11-14 ENCOUNTER — Other Ambulatory Visit: Payer: Self-pay

## 2020-11-14 DIAGNOSIS — M545 Low back pain, unspecified: Secondary | ICD-10-CM | POA: Diagnosis not present

## 2020-11-14 DIAGNOSIS — M5431 Sciatica, right side: Secondary | ICD-10-CM | POA: Diagnosis not present

## 2020-11-14 MED ORDER — GADOBUTROL 1 MMOL/ML IV SOLN
10.0000 mL | Freq: Once | INTRAVENOUS | Status: AC | PRN
  Administered 2020-11-14: 10 mL via INTRAVENOUS

## 2020-11-16 ENCOUNTER — Other Ambulatory Visit: Payer: BC Managed Care – PPO

## 2020-11-19 ENCOUNTER — Other Ambulatory Visit: Payer: BC Managed Care – PPO | Admitting: Urology

## 2020-11-20 ENCOUNTER — Other Ambulatory Visit
Admission: RE | Admit: 2020-11-20 | Discharge: 2020-11-20 | Disposition: A | Discharge status: Home / Self Care | Payer: BC Managed Care – PPO | Source: Ambulatory Visit | Attending: Unknown Physician Specialty | Admitting: Unknown Physician Specialty

## 2020-11-20 ENCOUNTER — Other Ambulatory Visit: Payer: Self-pay

## 2020-11-20 DIAGNOSIS — Z01812 Encounter for preprocedural laboratory examination: Secondary | ICD-10-CM | POA: Diagnosis not present

## 2020-11-20 DIAGNOSIS — Z20822 Contact with and (suspected) exposure to covid-19: Secondary | ICD-10-CM | POA: Insufficient documentation

## 2020-11-20 LAB — BASIC METABOLIC PANEL
Anion gap: 10 (ref 5–15)
BUN: 20 mg/dL (ref 6–20)
CO2: 26 mmol/L (ref 22–32)
Calcium: 9.1 mg/dL (ref 8.9–10.3)
Chloride: 98 mmol/L (ref 98–111)
Creatinine, Ser: 0.97 mg/dL (ref 0.61–1.24)
GFR, Estimated: >60 mL/min (ref >60)
Glucose, Bld: 131 mg/dL — ABNORMAL HIGH (ref 70–99)
Potassium: 3.7 mmol/L (ref 3.5–5.1)
Sodium: 134 mmol/L — ABNORMAL LOW (ref 135–145)

## 2020-11-20 LAB — CBC
HCT: 37.2 % — ABNORMAL LOW (ref 39.0–52.0)
Hemoglobin: 11.8 g/dL — ABNORMAL LOW (ref 13.0–17.0)
MCH: 20 pg — ABNORMAL LOW (ref 26.0–34.0)
MCHC: 31.7 g/dL (ref 30.0–36.0)
MCV: 63.2 fL — ABNORMAL LOW (ref 80.0–100.0)
Platelets: 207 10*3/uL (ref 150–400)
RBC: 5.89 MIL/uL — ABNORMAL HIGH (ref 4.22–5.81)
RDW: 16.5 % — ABNORMAL HIGH (ref 11.5–15.5)
WBC: 6.3 10*3/uL (ref 4.0–10.5)
nRBC: 0 % (ref 0.0–0.2)

## 2020-11-21 DIAGNOSIS — E118 Type 2 diabetes mellitus with unspecified complications: Secondary | ICD-10-CM | POA: Diagnosis not present

## 2020-11-21 LAB — SARS CORONAVIRUS 2 (TAT 6-24 HRS): SARS Coronavirus 2: NEGATIVE

## 2020-11-24 ENCOUNTER — Encounter
Admission: RE | Disposition: A | Discharge status: Home / Self Care | Payer: Self-pay | Source: Home / Self Care | Attending: Unknown Physician Specialty

## 2020-11-24 ENCOUNTER — Ambulatory Visit: Payer: BC Managed Care – PPO | Admitting: Urgent Care

## 2020-11-24 ENCOUNTER — Encounter: Payer: Self-pay | Admitting: Unknown Physician Specialty

## 2020-11-24 ENCOUNTER — Other Ambulatory Visit: Payer: Self-pay

## 2020-11-24 ENCOUNTER — Other Ambulatory Visit: Payer: Self-pay | Admitting: Family Medicine

## 2020-11-24 ENCOUNTER — Ambulatory Visit
Admission: RE | Admit: 2020-11-24 | Discharge: 2020-11-24 | Disposition: A | Discharge status: Home / Self Care | Payer: BC Managed Care – PPO | Attending: Unknown Physician Specialty | Admitting: Unknown Physician Specialty

## 2020-11-24 DIAGNOSIS — C61 Malignant neoplasm of prostate: Secondary | ICD-10-CM | POA: Diagnosis not present

## 2020-11-24 DIAGNOSIS — R070 Pain in throat: Secondary | ICD-10-CM | POA: Diagnosis not present

## 2020-11-24 DIAGNOSIS — K219 Gastro-esophageal reflux disease without esophagitis: Secondary | ICD-10-CM | POA: Insufficient documentation

## 2020-11-24 DIAGNOSIS — Z79899 Other long term (current) drug therapy: Secondary | ICD-10-CM | POA: Insufficient documentation

## 2020-11-24 DIAGNOSIS — Z7982 Long term (current) use of aspirin: Secondary | ICD-10-CM | POA: Insufficient documentation

## 2020-11-24 DIAGNOSIS — D141 Benign neoplasm of larynx: Secondary | ICD-10-CM | POA: Insufficient documentation

## 2020-11-24 DIAGNOSIS — E119 Type 2 diabetes mellitus without complications: Secondary | ICD-10-CM | POA: Diagnosis not present

## 2020-11-24 DIAGNOSIS — G4733 Obstructive sleep apnea (adult) (pediatric): Secondary | ICD-10-CM | POA: Diagnosis not present

## 2020-11-24 DIAGNOSIS — D380 Neoplasm of uncertain behavior of larynx: Secondary | ICD-10-CM | POA: Diagnosis not present

## 2020-11-24 HISTORY — PX: DIRECT LARYNGOSCOPY: SHX5326

## 2020-11-24 LAB — GLUCOSE, CAPILLARY
Glucose-Capillary: 129 mg/dL — ABNORMAL HIGH (ref 70–99)
Glucose-Capillary: 129 mg/dL — ABNORMAL HIGH (ref 70–99)

## 2020-11-24 SURGERY — LARYNGOSCOPY, DIRECT
Anesthesia: General

## 2020-11-24 MED ORDER — SUGAMMADEX SODIUM 500 MG/5ML IV SOLN
INTRAVENOUS | Status: DC | PRN
  Administered 2020-11-24: 100 mg via INTRAVENOUS
  Administered 2020-11-24: 400 mg via INTRAVENOUS

## 2020-11-24 MED ORDER — OXYCODONE HCL 5 MG/5ML PO SOLN: 5.0000 mg | Freq: Once | ORAL | Status: DC | PRN

## 2020-11-24 MED ORDER — FENTANYL CITRATE (PF) 100 MCG/2ML IJ SOLN
INTRAMUSCULAR | Status: AC
  Filled 2020-11-24: qty 2

## 2020-11-24 MED ORDER — PROPOFOL 500 MG/50ML IV EMUL
INTRAVENOUS | Status: AC
  Filled 2020-11-24: qty 50

## 2020-11-24 MED ORDER — PROPOFOL 500 MG/50ML IV EMUL
INTRAVENOUS | Status: DC | PRN
  Administered 2020-11-24: 150 ug/kg/min via INTRAVENOUS

## 2020-11-24 MED ORDER — FENTANYL CITRATE (PF) 100 MCG/2ML IJ SOLN
INTRAMUSCULAR | Status: DC | PRN
  Administered 2020-11-24: 25 ug via INTRAVENOUS
  Administered 2020-11-24: 50 ug via INTRAVENOUS

## 2020-11-24 MED ORDER — GLYCOPYRROLATE 0.2 MG/ML IJ SOLN
INTRAMUSCULAR | Status: DC | PRN
  Administered 2020-11-24: .2 mg via INTRAVENOUS

## 2020-11-24 MED ORDER — PROPOFOL 10 MG/ML IV BOLUS
INTRAVENOUS | Status: AC
  Filled 2020-11-24: qty 20

## 2020-11-24 MED ORDER — SUGAMMADEX SODIUM 500 MG/5ML IV SOLN
INTRAVENOUS | Status: AC
  Filled 2020-11-24: qty 5

## 2020-11-24 MED ORDER — PHENYLEPHRINE HCL (PRESSORS) 10 MG/ML IV SOLN
INTRAVENOUS | Status: DC | PRN
  Administered 2020-11-24 (×4): 100 ug via INTRAVENOUS

## 2020-11-24 MED ORDER — SUCCINYLCHOLINE CHLORIDE 20 MG/ML IJ SOLN
INTRAMUSCULAR | Status: DC | PRN
  Administered 2020-11-24: 140 mg via INTRAVENOUS

## 2020-11-24 MED ORDER — FENTANYL CITRATE (PF) 100 MCG/2ML IJ SOLN: 25.0000 ug | INTRAMUSCULAR | Status: DC | PRN

## 2020-11-24 MED ORDER — CHLORHEXIDINE GLUCONATE 0.12 % MT SOLN: 15.0000 mL | Freq: Once | OROMUCOSAL | Status: AC

## 2020-11-24 MED ORDER — ONDANSETRON HCL 4 MG/2ML IJ SOLN: 4.0000 mg | Freq: Once | INTRAMUSCULAR | Status: DC | PRN

## 2020-11-24 MED ORDER — EPHEDRINE SULFATE 50 MG/ML IJ SOLN
INTRAMUSCULAR | Status: DC | PRN
  Administered 2020-11-24: 5 mg via INTRAVENOUS

## 2020-11-24 MED ORDER — PROPOFOL 10 MG/ML IV BOLUS
INTRAVENOUS | Status: DC | PRN
  Administered 2020-11-24: 30 mg via INTRAVENOUS
  Administered 2020-11-24: 170 mg via INTRAVENOUS

## 2020-11-24 MED ORDER — MIDAZOLAM HCL 2 MG/2ML IJ SOLN
INTRAMUSCULAR | Status: AC
  Filled 2020-11-24: qty 2

## 2020-11-24 MED ORDER — DEXAMETHASONE SODIUM PHOSPHATE 10 MG/ML IJ SOLN
INTRAMUSCULAR | Status: DC | PRN
  Administered 2020-11-24: 10 mg via INTRAVENOUS

## 2020-11-24 MED ORDER — MIDAZOLAM HCL 2 MG/2ML IJ SOLN
INTRAMUSCULAR | Status: DC | PRN
  Administered 2020-11-24: 2 mg via INTRAVENOUS

## 2020-11-24 MED ORDER — ROCURONIUM BROMIDE 100 MG/10ML IV SOLN
INTRAVENOUS | Status: DC | PRN
  Administered 2020-11-24 (×2): 20 mg via INTRAVENOUS

## 2020-11-24 MED ORDER — SODIUM CHLORIDE 0.9 % IV SOLN: INTRAVENOUS | Status: DC

## 2020-11-24 MED ORDER — EPHEDRINE 5 MG/ML INJ
INTRAVENOUS | Status: AC
  Filled 2020-11-24: qty 10

## 2020-11-24 MED ORDER — ORAL CARE MOUTH RINSE: 15.0000 mL | Freq: Once | OROMUCOSAL | Status: AC

## 2020-11-24 MED ORDER — OXYCODONE HCL 5 MG PO TABS: 5.0000 mg | ORAL_TABLET | Freq: Once | ORAL | Status: DC | PRN

## 2020-11-24 MED ORDER — DEXMEDETOMIDINE (PRECEDEX) IN NS 20 MCG/5ML (4 MCG/ML) IV SYRINGE
PREFILLED_SYRINGE | INTRAVENOUS | Status: DC | PRN
  Administered 2020-11-24: 2 ug via INTRAVENOUS

## 2020-11-24 MED ORDER — OXYMETAZOLINE HCL 0.05 % NA SOLN
NASAL | Status: AC
  Filled 2020-11-24: qty 30

## 2020-11-24 MED ORDER — ONDANSETRON HCL 4 MG/2ML IJ SOLN
INTRAMUSCULAR | Status: DC | PRN
  Administered 2020-11-24: 4 mg via INTRAVENOUS

## 2020-11-24 MED ORDER — LIDOCAINE HCL (CARDIAC) PF 100 MG/5ML IV SOSY
PREFILLED_SYRINGE | INTRAVENOUS | Status: DC | PRN
  Administered 2020-11-24: 100 mg via INTRAVENOUS

## 2020-11-24 MED ORDER — LIDOCAINE HCL (PF) 4 % IJ SOLN
INTRAMUSCULAR | Status: AC
  Filled 2020-11-24: qty 10

## 2020-11-24 MED ORDER — CHLORHEXIDINE GLUCONATE 0.12 % MT SOLN
OROMUCOSAL | Status: AC
  Administered 2020-11-24: 15 mL via OROMUCOSAL
  Filled 2020-11-24: qty 15

## 2020-11-24 SURGICAL SUPPLY — 23 items
CANISTER SUCT 1200ML W/VALVE (MISCELLANEOUS) ×2 IMPLANT
COVER BACK TABLE REUSABLE LG (DRAPES) ×2 IMPLANT
COVER WAND RF STERILE (DRAPES) ×2 IMPLANT
CUP MEDICINE 2OZ PLAST GRAD ST (MISCELLANEOUS) ×2 IMPLANT
DRSG TELFA 4X3 1S NADH ST (GAUZE/BANDAGES/DRESSINGS) ×2 IMPLANT
GAUZE 4X4 16PLY RFD (DISPOSABLE) ×2 IMPLANT
GLOVE BIO SURGEON STRL SZ7.5 (GLOVE) ×2 IMPLANT
GOWN STRL REUS W/ TWL LRG LVL3 (GOWN DISPOSABLE) ×1 IMPLANT
GOWN STRL REUS W/TWL LRG LVL3 (GOWN DISPOSABLE) ×2
MANIFOLD NEPTUNE II (INSTRUMENTS) ×2 IMPLANT
NDL FILTER BLUNT 18X1 1/2 (NEEDLE) ×1 IMPLANT
NDL HYPO 25GX1X1/2 BEV (NEEDLE) ×1 IMPLANT
NDL SAFETY ECLIPSE 18X1.5 (NEEDLE) ×1 IMPLANT
NEEDLE FILTER BLUNT 18X 1/2SAF (NEEDLE) ×1
NEEDLE FILTER BLUNT 18X1 1/2 (NEEDLE) ×1 IMPLANT
NEEDLE HYPO 18GX1.5 SHARP (NEEDLE) ×2
NEEDLE HYPO 25GX1X1/2 BEV (NEEDLE) ×2 IMPLANT
PATTIES SURGICAL .5 X.5 (GAUZE/BANDAGES/DRESSINGS) ×2 IMPLANT
SOL ANTI-FOG 6CC FOG-OUT (MISCELLANEOUS) ×1 IMPLANT
SOL FOG-OUT ANTI-FOG 6CC (MISCELLANEOUS) ×1
SYR 10ML LL (SYRINGE) ×2 IMPLANT
TOWEL OR 17X26 4PK STRL BLUE (TOWEL DISPOSABLE) ×2 IMPLANT
TUBING CONNECTING 10 (TUBING) ×2 IMPLANT

## 2020-11-24 NOTE — Anesthesia Preprocedure Evaluation (Addendum)
Anesthesia Evaluation  Patient identified by MRN, date of birth, ID band Patient awake    Reviewed: Allergy & Precautions, H&P , NPO status , Patient's Chart, lab work & pertinent test results  History of Anesthesia Complications Negative for: history of anesthetic complications  Airway Mallampati: III  TM Distance: >3 FB Neck ROM: limited    Dental  (+) Chipped   Pulmonary shortness of breath, sleep apnea , neg COPD,    breath sounds clear to auscultation       Cardiovascular hypertension, (-) angina(-) Past MI and (-) Cardiac Stents (-) dysrhythmias  Rhythm:regular Rate:Normal     Neuro/Psych PSYCHIATRIC DISORDERS Depression negative neurological ROS     GI/Hepatic Neg liver ROS, GERD  ,  Endo/Other  diabetesMorbid obesity  Renal/GU      Musculoskeletal   Abdominal   Peds  Hematology  (+) Blood dyscrasia, anemia , Hgb 11.8   Anesthesia Other Findings Past Medical History: No date: Anemia No date: Arthritis No date: Diabetes mellitus without complication (HCC)     Comment:  diet controlled No date: Dyspnea No date: Enlarged prostate     Comment:  hx of No date: Environmental and seasonal allergies No date: Frequency of urination No date: GERD (gastroesophageal reflux disease) No date: History of kidney stones No date: HOH (hard of hearing)     Comment:  wears bilateral hearing aids No date: Hypertension No date: Irregular heart beat     Comment:  Pt feels this every once in a while; PCP aware but               stated it was OK 01/15/2017: Kidney stone on right side No date: Leg pain, right     Comment:  Take Lyrica and Naproxen No date: MRSA (methicillin resistant staph aureus) culture positive No date: Neck pain of over 3 months duration No date: Prostate cancer ([place]) No date: Sleep apnea     Comment:  cpap-15 setting No date: Thalassemia No date: Tick fever  Past Surgical History: 11/13/2014:  ANTERIOR CERVICAL DECOMP/DISCECTOMY FUSION; N/A     Comment:  Procedure: Cervical three-four, Cervical six-seven               anterior cervical decompression with fusion interbody               prosthesis plating and bonegraft;  Surgeon: Newman Pies, MD;  Location: [place];  Service: Neurosurgery;                Laterality: N/A;  Cervical three-four, Cervical six-seven              anterior cervical decompression with fusion interbody               prosthesis plating and bonegraft No date: BACK SURGERY     Comment:  Lumbar X 2 01/11/2017: CALCANEAL OSTEOTOMY; Right     Comment:  Procedure: CALCANEAL OSTEOTOMY;  Surgeon: Earnestine Leys,              MD;  Location: [facility];  Service: Orthopedics;                Laterality: Right; 12/13/2017: CIRCUMCISION; N/A     Comment:  Procedure: PENILE BLOCK, LYSIS OF PENILE ADHESIONS;                Surgeon: Hollice Espy, MD;  Location: [facility];  Service: Urology;  Laterality: N/A; No date: COLONOSCOPY 12/13/2017: CYSTOSCOPY; N/A     Comment:  Procedure: CYSTOSCOPY;  Surgeon: Hollice Espy, MD;                Location: [facility];  Service: Urology;  Laterality: N/A; No date: HERNIA REPAIR; Left     Comment:  groin No date: LEG SURGERY; Right     Comment:  Had surgery on right calf X 7 No date: LITHOTRIPSY 11/28/2019: LUMBAR WOUND DEBRIDEMENT; N/A     Comment:  Procedure: LUMBAR WOUND DEBRIDEMENT;  Surgeon: Newman Pies, MD;  Location: [place];  Service: Neurosurgery;                Laterality: N/A;  LUMBAR WOUND DEBRIDEMENT 06/22/2015: QUADRICEPS TENDON REPAIR; Left     Comment:  Procedure: REPAIR QUADRICEP TENDON;  Surgeon: Earnestine Leys, MD;  Location: [facility];  Service: Orthopedics;                Laterality: Left; 08/12/2015: [place]; Left     Comment:  Procedure: REPAIR QUADRICEP TENDON;  Surgeon: Earnestine Leys, MD;  Location: [facility];  Service:  Orthopedics;                Laterality: Left; No date: ROTATOR CUFF REPAIR; Left No date: TONSILLECTOMY No date: VASECTOMY     Comment:  with hydrocele repaired  BMI    Body Mass Index: 41.97 kg/m      Reproductive/Obstetrics negative OB ROS                            Anesthesia Physical Anesthesia Plan  ASA: III  Anesthesia Plan: General ETT   Post-op Pain Management:    Induction:   PONV Risk Score and Plan: Ondansetron, Dexamethasone and Treatment may vary due to age or medical condition  Airway Management Planned:   Additional Equipment:   Intra-op Plan:   Post-operative Plan:   Informed Consent: I have reviewed the patients History and Physical, chart, labs and discussed the procedure including the risks, benefits and alternatives for the proposed anesthesia with the patient or authorized representative who has indicated his/her understanding and acceptance.     Dental Advisory Given  Plan Discussed with: Anesthesiologist, CRNA and Surgeon  Anesthesia Plan Comments: (MLT tube)        Anesthesia Quick Evaluation

## 2020-11-24 NOTE — Op Note (Signed)
11/24/2020  9:49 AM    Katharine Look  [id number]   Pre-Op Dx: Neoplasm uncertain behavior larynx  Post-op Dx: SAME  Proc: Microlaryngoscopy with excision of left arytenoid mass  Surg:  Roena Malady  Anes:  GOT  EBL: Less than 5 cc  Comp: None  Findings: Soft tissue mass overlying left arytenoid  Procedure: Velma was identified in the holding area take the operating placed in supine position after general endotracheal anesthesia the table is turned 90 degrees.  Tooth guard was placed.  A Dedo laryngoscope was introduced into the airway and suspended.  0 degree Hopkins rod was used to examine the larynx soft tissue mass overlying the left arytenoid.  Cottonoid pledget with phenylephrine lidocaine solution was used to bathe the larynx.  This pledget was then removed.  The operating microscope was brought into the field.  Using the Shapshay microlaryngeal instrument cup forceps were used to grasp the soft tissue mass 45 degrees scissors was used to excise it from the arytenoid region.  There was taken to avoid injury to the vocal ligament or the arytenoid.  With the mass removed the larynx was again bathed with phenylephrine lidocaine solution.  The documentation was performed following excision.  The laryngoscope was then removed the patient was turned to anesthesia where he was extubated in the operating type cart in stable condition.  Specimen: Left arytenoid mass  Dispo:   Good  Plan: Discharged home follow-up 2 weeks  Roena Malady  11/24/2020 9:49 AM

## 2020-11-24 NOTE — Discharge Instructions (Signed)

## 2020-11-24 NOTE — H&P (Signed)
The patient's history has been reviewed, patient examined, no change in status, stable for surgery.  Questions were answered to the patients satisfaction.  

## 2020-11-24 NOTE — Anesthesia Procedure Notes (Signed)
Procedure Name: Intubation Date/Time: 11/24/2020 9:13 AM Performed by: Lily Peer, Pranika Finks, CRNA Pre-anesthesia Checklist: Patient identified, Emergency Drugs available, Suction available and Patient being monitored Patient Re-evaluated:Patient Re-evaluated prior to induction Oxygen Delivery Method: Circle system utilized Preoxygenation: Pre-oxygenation with 100% oxygen Induction Type: IV induction Ventilation: Oral airway inserted - appropriate to patient size and Two handed mask ventilation required Laryngoscope Size: McGraph and 4 Grade View: Grade I Tube type: Oral Tube size: 6.5 mm Number of attempts: 1 Airway Equipment and Method: Stylet and Oral airway Placement Confirmation: ETT inserted through vocal cords under direct vision,  positive ETCO2 and breath sounds checked- equal and bilateral Secured at: 22 cm Tube secured with: Tape Dental Injury: Teeth and Oropharynx as per pre-operative assessment

## 2020-11-24 NOTE — Progress Notes (Signed)
   11/24/20 0800  Clinical Encounter Type  Visited With Family  Visit Type Initial  Referral From Chaplain  Consult/Referral To [facility] briefly visited with Pt's wife and she said all was well.

## 2020-11-24 NOTE — Transfer of Care (Signed)
Immediate Anesthesia Transfer of Care Note  Patient: Justin Soto  Procedure(s) Performed: MICRO DIRECT LARYNGOSCOPY W/BIOPSY (N/A )  Patient Location: PACU  Anesthesia Type:General  Level of Consciousness: drowsy  Airway & Oxygen Therapy: Patient Spontanous Breathing and Patient connected to face mask oxygen  Post-op Assessment: Report given to RN and Post -op Vital signs reviewed and stable  Post vital signs: Reviewed and stable  Last Vitals:  Vitals Value Taken Time  BP 114/63 11/24/20 0956  Temp 36.2 C 11/24/20 0956  Pulse 94 11/24/20 0958  Resp 21 11/24/20 0958  SpO2 94 % 11/24/20 0958  Vitals shown include unvalidated device data.  Last Pain:  Vitals:   11/24/20 0956  TempSrc:   PainSc: Asleep         Complications: No complications documented.

## 2020-11-25 LAB — SURGICAL PATHOLOGY

## 2020-11-25 NOTE — Anesthesia Postprocedure Evaluation (Signed)
Anesthesia Post Note  Patient: Justin Soto  Procedure(s) Performed: MICRO DIRECT LARYNGOSCOPY W/BIOPSY (N/A )  Patient location during evaluation: PACU Anesthesia Type: General Level of consciousness: awake and alert Pain management: pain level controlled Vital Signs Assessment: post-procedure vital signs reviewed and stable Respiratory status: spontaneous breathing, nonlabored ventilation and respiratory function stable Cardiovascular status: blood pressure returned to baseline and stable Postop Assessment: no apparent nausea or vomiting Anesthetic complications: no   No complications documented.   Last Vitals:  Vitals:   11/24/20 1023 11/24/20 1034  BP: 118/69 117/64  Pulse: 81 77  Resp: (!) 21 18  Temp:  (!) 36.3 C  SpO2: 92% 94%    Last Pain:  Vitals:   11/24/20 1034  TempSrc: Temporal  PainSc: 0-No pain                 Brett Canales Dmitri Pettigrew

## 2020-12-08 DIAGNOSIS — M5417 Radiculopathy, lumbosacral region: Secondary | ICD-10-CM | POA: Diagnosis not present

## 2020-12-08 DIAGNOSIS — M4807 Spinal stenosis, lumbosacral region: Secondary | ICD-10-CM | POA: Diagnosis not present

## 2020-12-09 ENCOUNTER — Other Ambulatory Visit: Payer: Self-pay

## 2020-12-09 ENCOUNTER — Encounter: Payer: Self-pay | Admitting: Urology

## 2020-12-09 ENCOUNTER — Ambulatory Visit: Payer: BC Managed Care – PPO | Admitting: Urology

## 2020-12-09 VITALS — BP 151/83 | HR 91 | Ht 66.0 in | Wt 258.0 lb

## 2020-12-09 DIAGNOSIS — C61 Malignant neoplasm of prostate: Secondary | ICD-10-CM

## 2020-12-09 DIAGNOSIS — Z48813 Encounter for surgical aftercare following surgery on the respiratory system: Secondary | ICD-10-CM | POA: Diagnosis not present

## 2020-12-09 MED ORDER — LEVOFLOXACIN 500 MG PO TABS
500.0000 mg | ORAL_TABLET | Freq: Once | ORAL | Status: AC
  Administered 2020-12-09: 500 mg via ORAL

## 2020-12-09 MED ORDER — GENTAMICIN SULFATE 40 MG/ML IJ SOLN
80.0000 mg | Freq: Once | INTRAMUSCULAR | Status: AC
  Administered 2020-12-09: 80 mg via INTRAMUSCULAR

## 2020-12-09 NOTE — Progress Notes (Signed)
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  A time out was performed to ensure correct patient identity.  Pre-Procedure: - Last PSA Level: 1.4 on 07/22/2020 - Gentamicin given prophylactically - Levaquin 500 mg administered PO -Transrectal Ultrasound performed revealing a 34 gm prostate -No significant hypoechoic or median lobe noted  Procedure: - Prostate block performed using 10 cc 1% lidocaine and biopsies taken from sextant areas, a total of 12 under ultrasound guidance.  Post-Procedure: - Patient tolerated the procedure well - He was counseled to seek immediate medical attention if experiences any severe pain, significant bleeding, or fevers -He will be contacted with the pathology results   Irineo Axon, MD

## 2020-12-14 LAB — SURGICAL PATHOLOGY

## 2020-12-17 ENCOUNTER — Other Ambulatory Visit: Payer: Self-pay | Admitting: Urology

## 2020-12-17 DIAGNOSIS — R350 Frequency of micturition: Secondary | ICD-10-CM

## 2020-12-22 ENCOUNTER — Telehealth: Payer: Self-pay | Admitting: Urology

## 2020-12-22 NOTE — Telephone Encounter (Signed)
Contacted patient to discuss confirmatory biopsy results.  He had no postprocedural problems.  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  Tumor volume much less than original biopsy and no Gleason 7 or greater cancer detected  He desired to continue active surveillance and recommend 63-month follow-up with PSA

## 2020-12-25 NOTE — Telephone Encounter (Signed)
Spoke with patient and he said he would call back to get appts Mailed out  [place]

## 2020-12-27 ENCOUNTER — Other Ambulatory Visit: Payer: Self-pay | Admitting: Family Medicine

## 2020-12-27 DIAGNOSIS — I10 Essential (primary) hypertension: Secondary | ICD-10-CM

## 2020-12-29 ENCOUNTER — Other Ambulatory Visit: Payer: Self-pay | Admitting: Physician Assistant

## 2020-12-29 DIAGNOSIS — G4733 Obstructive sleep apnea (adult) (pediatric): Secondary | ICD-10-CM | POA: Diagnosis not present

## 2021-01-06 ENCOUNTER — Other Ambulatory Visit: Payer: Self-pay | Admitting: Family Medicine

## 2021-01-06 DIAGNOSIS — I10 Essential (primary) hypertension: Secondary | ICD-10-CM

## 2021-01-20 ENCOUNTER — Other Ambulatory Visit: Payer: Self-pay

## 2021-01-20 ENCOUNTER — Ambulatory Visit (INDEPENDENT_AMBULATORY_CARE_PROVIDER_SITE_OTHER): Payer: BC Managed Care – PPO | Admitting: Physician Assistant

## 2021-01-20 ENCOUNTER — Encounter: Payer: Self-pay | Admitting: Physician Assistant

## 2021-01-20 VITALS — BP 130/82 | HR 78 | Temp 98.9°F | Ht 66.0 in | Wt 257.0 lb

## 2021-01-20 DIAGNOSIS — Z114 Encounter for screening for human immunodeficiency virus [HIV]: Secondary | ICD-10-CM

## 2021-01-20 DIAGNOSIS — Z1211 Encounter for screening for malignant neoplasm of colon: Secondary | ICD-10-CM | POA: Diagnosis not present

## 2021-01-20 DIAGNOSIS — Z0000 Encounter for general adult medical examination without abnormal findings: Secondary | ICD-10-CM | POA: Diagnosis not present

## 2021-01-20 DIAGNOSIS — G4733 Obstructive sleep apnea (adult) (pediatric): Secondary | ICD-10-CM

## 2021-01-20 DIAGNOSIS — E119 Type 2 diabetes mellitus without complications: Secondary | ICD-10-CM | POA: Diagnosis not present

## 2021-01-20 DIAGNOSIS — I10 Essential (primary) hypertension: Secondary | ICD-10-CM | POA: Diagnosis not present

## 2021-01-20 DIAGNOSIS — C61 Malignant neoplasm of prostate: Secondary | ICD-10-CM

## 2021-01-20 NOTE — Progress Notes (Signed)
Complete physical exam   Patient: Justin Soto   DOB: 12-Feb-1961   60 y.o. Male  MRN: [id number] Visit Date: 01/20/2021  Today's healthcare provider: Mar Daring, PA-C   No chief complaint on file.  Subjective    Justin Soto is a 60 y.o. male who presents today for a complete physical exam.  He reports consuming a general diet. Exercises regularly. He generally feels well. He reports sleeping well. He does not have additional problems to discuss today.  HPI  Doing well no complaints.  Past Medical History:  Diagnosis Date  . Anemia   . Arthritis   . Diabetes mellitus without complication (HCC)    diet controlled  . Dyspnea   . Enlarged prostate    hx of  . Environmental and seasonal allergies   . Frequency of urination   . GERD (gastroesophageal reflux disease)   . History of kidney stones   . HOH (hard of hearing)    wears bilateral hearing aids  . Hypertension   . Irregular heart beat    Pt feels this every once in a while; PCP aware but stated it was OK  . Kidney stone on right side 01/15/2017  . Leg pain, right    Take Lyrica and Naproxen  . MRSA (methicillin resistant staph aureus) culture positive   . Neck pain of over 3 months duration   . Prostate cancer ([place])   . Sleep apnea    cpap-15 setting  . Thalassemia   . Tick fever    Past Surgical History:  Procedure Laterality Date  . ANTERIOR CERVICAL DECOMP/DISCECTOMY FUSION N/A 11/13/2014   Procedure: Cervical three-four, Cervical six-seven anterior cervical decompression with fusion interbody prosthesis plating and bonegraft;  Surgeon: Newman Pies, MD;  Location: [place];  Service: Neurosurgery;  Laterality: N/A;  Cervical three-four, Cervical six-seven anterior cervical decompression with fusion interbody prosthesis plating and bonegraft  . BACK SURGERY     Lumbar X 2  . CALCANEAL OSTEOTOMY Right 01/11/2017   Procedure: CALCANEAL OSTEOTOMY;  Surgeon: Earnestine Leys, MD;  Location: [facility];   Service: Orthopedics;  Laterality: Right;  . CIRCUMCISION N/A 12/13/2017   Procedure: PENILE BLOCK, LYSIS OF PENILE ADHESIONS;  Surgeon: Hollice Espy, MD;  Location: [facility];  Service: Urology;  Laterality: N/A;  . COLONOSCOPY    . CYSTOSCOPY N/A 12/13/2017   Procedure: CYSTOSCOPY;  Surgeon: Hollice Espy, MD;  Location: [facility];  Service: Urology;  Laterality: N/A;  . DIRECT LARYNGOSCOPY N/A 11/24/2020   Procedure: MICRO DIRECT LARYNGOSCOPY W/BIOPSY;  Surgeon: Beverly Gust, MD;  Location: [facility];  Service: ENT;  Laterality: N/A;  . HERNIA REPAIR Left    groin  . LEG SURGERY Right    Had surgery on right calf X 7  . LITHOTRIPSY    . LUMBAR WOUND DEBRIDEMENT N/A 11/28/2019   Procedure: LUMBAR WOUND DEBRIDEMENT;  Surgeon: Newman Pies, MD;  Location: [place];  Service: Neurosurgery;  Laterality: N/A;  LUMBAR WOUND DEBRIDEMENT  . QUADRICEPS TENDON REPAIR Left 06/22/2015   Procedure: REPAIR QUADRICEP TENDON;  Surgeon: Earnestine Leys, MD;  Location: [facility];  Service: Orthopedics;  Laterality: Left;  Marland Kitchen QUADRICEPS TENDON REPAIR Left 08/12/2015   Procedure: REPAIR QUADRICEP TENDON;  Surgeon: Earnestine Leys, MD;  Location: [facility];  Service: Orthopedics;  Laterality: Left;  . ROTATOR CUFF REPAIR Left   . TONSILLECTOMY    . VASECTOMY     with hydrocele repaired   Social History  Socioeconomic History  . Marital status: Married    Spouse name: April  . Number of children: Not on file  . Years of education: Not on file  . Highest education level: Not on file  Occupational History  . Not on file  Tobacco Use  . Smoking status: Never Smoker  . Smokeless tobacco: Never Used  Vaping Use  . Vaping Use: Never used  Substance and Sexual Activity  . Alcohol use: No  . Drug use: No  . Sexual activity: Yes    Birth control/protection: None  Other Topics Concern  . Not on file  Social History Narrative  . Not on file   Social Determinants of Health   Financial Resource Strain:  Not on file  Food Insecurity: Not on file  Transportation Needs: Not on file  Physical Activity: Not on file  Stress: Not on file  Social Connections: Not on file  Intimate Partner Violence: Not on file   Family Status  Relation Name Status  . Father  Deceased  . Mother  Alive  . Sister  Alive  . Brother  Alive  . Daughter  Alive  . Brother  Alive  . Neg Hx  (Not Specified)   Family History  Problem Relation Age of Onset  . Prostate cancer Father   . Heart disease Father   . Diabetes Father   . Alzheimer's disease Father   . Anemia Mother   . Osteoporosis Mother   . Healthy Sister   . Diabetes Brother   . Hypertension Brother   . Anemia Daughter   . Diabetes Brother   . Kidney disease Neg Hx   . Bladder Cancer Neg Hx   . Colon cancer Neg Hx    Allergies  Allergen Reactions  . Methocarbamol Other (See Comments)    Numbness, and change of taste   . Tizanidine Nausea Only  . Vicodin [Hydrocodone-Acetaminophen] Itching    Can take with benadryl     Patient Care Team: Jerrol Banana., MD as PCP - General (Family Medicine)   Medications: Outpatient Medications Prior to Visit  Medication Sig  . amLODipine-benazepril (LOTREL) 10-40 MG capsule TAKE 1 CAPSULE BY MOUTH EVERY DAY  . aspirin EC 81 MG tablet Take 81 mg by mouth daily.   Marland Kitchen atorvastatin (LIPITOR) 40 MG tablet Take 40 mg by mouth daily.  . cetirizine (ZYRTEC) 10 MG tablet TAKE ONE TO TWO TABLETS BY MOUTH EVERY DAY AS NEEDED. (Patient taking differently: Take 10 mg by mouth daily.)  . cyclobenzaprine (FLEXERIL) 10 MG tablet Take 1 tablet (10 mg total) by mouth 3 (three) times daily as needed for muscle spasms.  . finasteride (PROSCAR) 5 MG tablet TAKE 1 TABLET BY MOUTH EVERY DAY (Patient taking differently: Take 5 mg by mouth daily.)  . fluticasone (FLONASE) 50 MCG/ACT nasal spray USE TWO SPRAYS IN EACH NOSTRIL EVERY DAY (Patient taking differently: Place 2 sprays into both nostrils daily.)  . gabapentin  (NEURONTIN) 300 MG capsule Take 300-600 mg by mouth See admin instructions. 600 mg in the morning, and 300 mg at bedtime  . hydrochlorothiazide (HYDRODIURIL) 25 MG tablet TAKE 1 TABLET BY MOUTH EVERY DAY  . isosorbide mononitrate (IMDUR) 30 MG 24 hr tablet Take 30 mg by mouth daily.  . meloxicam (MOBIC) 15 MG tablet Take 15 mg by mouth daily.  . metoprolol tartrate (LOPRESSOR) 25 MG tablet TAKE 1 TABLET BY MOUTH EVERY DAY (Patient taking differently: Take 25 mg by mouth daily.)  .  montelukast (SINGULAIR) 10 MG tablet TAKE 1 TABLET BY MOUTH EVERY DAY IN THE MORNING  . Multiple Vitamin (MULTIVITAMIN WITH MINERALS) TABS tablet Take 1 tablet by mouth daily.  Marland Kitchen omeprazole (PRILOSEC) 20 MG capsule Take 20 mg by mouth daily.   . tamsulosin (FLOMAX) 0.4 MG CAPS capsule TAKE 2 CAPSULES BY MOUTH EVERY DAY AS DIRECTED   No facility-administered medications prior to visit.    Review of Systems  Constitutional: Negative.   HENT: Negative.   Eyes: Negative.   Respiratory: Negative.   Cardiovascular: Negative.   Gastrointestinal: Negative.   Endocrine: Negative.   Genitourinary: Negative.   Musculoskeletal: Negative.   Skin: Negative.   Allergic/Immunologic: Negative.   Neurological: Negative.   Hematological: Negative.   Psychiatric/Behavioral: Negative.     Last CBC Lab Results  Component Value Date   WBC 6.3 11/20/2020   HGB 11.8 (L) 11/20/2020   HCT 37.2 (L) 11/20/2020   MCV 63.2 (L) 11/20/2020   MCH 20.0 (L) 11/20/2020   RDW 16.5 (H) 11/20/2020   PLT 207 73/42/8768   Last metabolic panel Lab Results  Component Value Date   GLUCOSE 131 (H) 11/20/2020   NA 134 (L) 11/20/2020   K 3.7 11/20/2020   CL 98 11/20/2020   CO2 26 11/20/2020   BUN 20 11/20/2020   CREATININE 0.97 11/20/2020   GFRNONAA >60 11/20/2020   GFRAA >60 11/30/2019   CALCIUM 9.1 11/20/2020   PROT 7.3 08/10/2017   ALBUMIN 4.7 08/10/2017   LABGLOB 2.6 08/10/2017   AGRATIO 1.8 08/10/2017   BILITOT 0.6  08/10/2017   ALKPHOS 50 08/10/2017   AST 30 08/10/2017   ALT 28 08/10/2017   ANIONGAP 10 11/20/2020      Objective    BP 130/82 (BP Location: Right Arm, Patient Position: Sitting, Cuff Size: Large)   Pulse 78   Temp 98.9 F (37.2 C) (Oral)   Ht 5\' 6"  (1.676 m)   Wt 257 lb (116.6 kg)   BMI 41.48 kg/m  BP Readings from Last 3 Encounters:  01/20/21 130/82  12/09/20 (!) 151/83  11/24/20 117/64   Wt Readings from Last 3 Encounters:  01/20/21 257 lb (116.6 kg)  12/09/20 258 lb (117 kg)  11/24/20 260 lb (117.9 kg)      Physical Exam Vitals reviewed.  Constitutional:      General: He is not in acute distress.    Appearance: Normal appearance. He is well-developed. He is obese. He is not ill-appearing.  HENT:     Head: Normocephalic and atraumatic.     Right Ear: Tympanic membrane, ear canal and external ear normal.     Left Ear: Tympanic membrane, ear canal and external ear normal.  Eyes:     General: No scleral icterus.       Right eye: No discharge.        Left eye: No discharge.     Extraocular Movements: Extraocular movements intact.     Conjunctiva/sclera: Conjunctivae normal.     Pupils: Pupils are equal, round, and reactive to light.  Neck:     Thyroid: No thyromegaly.     Vascular: No carotid bruit.     Trachea: No tracheal deviation.  Cardiovascular:     Rate and Rhythm: Normal rate and regular rhythm.     Pulses: Normal pulses.     Heart sounds: Normal heart sounds. No murmur heard.   Pulmonary:     Effort: Pulmonary effort is normal. No respiratory distress.  Breath sounds: Normal breath sounds. No wheezing or rales.  Chest:     Chest wall: No tenderness.  Abdominal:     General: Abdomen is flat. Bowel sounds are normal. There is no distension.     Palpations: Abdomen is soft. There is no mass.     Tenderness: There is no abdominal tenderness. There is no guarding or rebound.  Musculoskeletal:        General: No tenderness. Normal range of  motion.     Cervical back: Normal range of motion and neck supple. No tenderness.     Right lower leg: No edema.     Left lower leg: No edema.  Lymphadenopathy:     Cervical: No cervical adenopathy.  Skin:    General: Skin is warm and dry.     Capillary Refill: Capillary refill takes less than 2 seconds.     Findings: No erythema or rash.  Neurological:     General: No focal deficit present.     Mental Status: He is alert and oriented to person, place, and time. Mental status is at baseline.     Cranial Nerves: No cranial nerve deficit.     Motor: No abnormal muscle tone.     Coordination: Coordination normal.     Deep Tendon Reflexes: Reflexes are normal and symmetric. Reflexes normal.  Psychiatric:        Mood and Affect: Mood normal.        Behavior: Behavior normal.        Thought Content: Thought content normal.        Judgment: Judgment normal.       Last depression screening scores PHQ 2/9 Scores 01/20/2021 10/19/2020 10/25/2018  PHQ - 2 Score 0 2 0  PHQ- 9 Score 0 2 -   Last fall risk screening Fall Risk  01/20/2021  Falls in the past year? 0  Number falls in past yr: 0  Injury with Fall? 0  Risk for fall due to : No Fall Risks  Follow up Falls evaluation completed   Last Audit-C alcohol use screening Alcohol Use Disorder Test (AUDIT) 01/20/2021  1. How often do you have a drink containing alcohol? 0  2. How many drinks containing alcohol do you have on a typical day when you are drinking? 0  3. How often do you have six or more drinks on one occasion? 0  AUDIT-C Score 0  Alcohol Brief Interventions/Follow-up AUDIT Score <7 follow-up not indicated   A score of 3 or more in women, and 4 or more in men indicates increased risk for alcohol abuse, EXCEPT if all of the points are from question 1   No results found for any visits on 01/20/21.  Assessment & Plan    Routine Health Maintenance and Physical Exam  Exercise Activities and Dietary recommendations Goals    None     Immunization History  Administered Date(s) Administered  . Influenza Split 01/03/2013  . Moderna Sars-Covid-2 Vaccination 12/12/2019, 01/10/2020  . Pneumococcal Polysaccharide-23 07/17/2020  . Tdap 01/03/2013    Health Maintenance  Topic Date Due  . FOOT EXAM  Never done  . OPHTHALMOLOGY EXAM  Never done  . COLONOSCOPY (Pts 45-80yrs Insurance coverage will need to be confirmed)  Never done  . COVID-19 Vaccine (3 - Moderna risk 4-dose series) 02/07/2020  . INFLUENZA VACCINE  03/11/2021 (Originally 07/12/2020)  . HEMOGLOBIN A1C  04/18/2021  . TETANUS/TDAP  01/03/2023  . PNEUMOCOCCAL POLYSACCHARIDE VACCINE AGE 12-64 HIGH RISK  Completed  . Hepatitis C Screening  Completed  . HIV Screening  Completed    Discussed health benefits of physical activity, and encouraged him to engage in regular exercise appropriate for his age and condition.  1. Annual physical exam Normal physical exam today. Will check labs as below and f/u pending lab results. If labs are stable and WNL he will not need to have these rechecked for one year at his next annual physical exam. He is to call the office in the meantime if he has any acute issue, questions or concerns.  2. Screening for colon cancer Previously done by Dr. Vira Agar. Referral to [facility], Dr. Alice Reichert, as below.  - Ambulatory referral to Gastroenterology  3. Primary hypertension Stable. Continue Amlodipine-Benazepril 10-40mg , HCTZ 25mg , Imdur 30mg , Metoprolol 25mg  daily. Will check labs as below and f/u pending results. - CBC w/Diff/Platelet - Comprehensive Metabolic Panel (CMET) - TSH - Lipid Panel With LDL/HDL Ratio - HgB A1c  4. OSA (obstructive sleep apnea) Uses CPAP nightly.   5. Type 2 diabetes mellitus without complication, without long-term current use of insulin (HCC) Stable. Diet controlled. Will check labs as below and f/u pending results. - CBC w/Diff/Platelet - Comprehensive Metabolic Panel (CMET) - TSH - Lipid  Panel With LDL/HDL Ratio - HgB A1c  6. Prostate cancer ([place]) Followed by Dr. Bernardo Heater, Urology. Will check labs as below and f/u pending results. - PSA  7. Obesity, Class III, BMI 40-49.9 (morbid obesity) ([place]) Doing well. Has lost from 272 pounds to now 257 pounds with diet and exercise since Nov 2021. Will check labs as below and f/u pending results. - CBC w/Diff/Platelet - Comprehensive Metabolic Panel (CMET) - TSH - Lipid Panel With LDL/HDL Ratio - HgB A1c  8. Screening for HIV without presence of risk factors Will check labs as below and f/u pending results. - HIV antibody (with reflex)   Return in about 3 months (around 04/19/2021) for A1c.     Reynolds Bowl, PA-C, have reviewed all documentation for this visit. The documentation on 01/20/21 for the exam, diagnosis, procedures, and orders are all accurate and complete.   Rubye Beach  [facility] [phone] (phone) [phone] (fax)  [place]

## 2021-01-20 NOTE — Patient Instructions (Signed)

## 2021-01-21 LAB — LIPID PANEL WITH LDL/HDL RATIO
Cholesterol, Total: 117 mg/dL (ref 100–199)
HDL: 30 mg/dL — ABNORMAL LOW (ref >39)
LDL Chol Calc (NIH): 62 mg/dL (ref 0–99)
LDL/HDL Ratio: 2.1 ratio (ref 0.0–3.6)
Triglycerides: 142 mg/dL (ref 0–149)
VLDL Cholesterol Cal: 25 mg/dL (ref 5–40)

## 2021-01-21 LAB — CBC WITH DIFFERENTIAL/PLATELET
Basophils Absolute: 0.1 10*3/uL (ref 0.0–0.2)
Basos: 1 %
EOS (ABSOLUTE): 0.2 10*3/uL (ref 0.0–0.4)
Eos: 4 %
Hematocrit: 39.4 % (ref 37.5–51.0)
Hemoglobin: 12.2 g/dL — ABNORMAL LOW (ref 13.0–17.7)
Immature Grans (Abs): 0 10*3/uL (ref 0.0–0.1)
Immature Granulocytes: 0 %
Lymphocytes Absolute: 1.2 10*3/uL (ref 0.7–3.1)
Lymphs: 24 %
MCH: 19.1 pg — ABNORMAL LOW (ref 26.6–33.0)
MCHC: 31 g/dL — ABNORMAL LOW (ref 31.5–35.7)
MCV: 62 fL — ABNORMAL LOW (ref 79–97)
Monocytes Absolute: 0.6 10*3/uL (ref 0.1–0.9)
Monocytes: 12 %
Neutrophils Absolute: 3 10*3/uL (ref 1.4–7.0)
Neutrophils: 59 %
Platelets: 192 10*3/uL (ref 150–450)
RBC: 6.38 x10E6/uL — ABNORMAL HIGH (ref 4.14–5.80)
RDW: 17.6 % — ABNORMAL HIGH (ref 11.6–15.4)
WBC: 5.1 10*3/uL (ref 3.4–10.8)

## 2021-01-21 LAB — PSA: Prostate Specific Ag, Serum: 2.3 ng/mL (ref 0.0–4.0)

## 2021-01-21 LAB — HEMOGLOBIN A1C
Est. average glucose Bld gHb Est-mCnc: 128 mg/dL
Hgb A1c MFr Bld: 6.1 % — ABNORMAL HIGH (ref 4.8–5.6)

## 2021-01-21 LAB — COMPREHENSIVE METABOLIC PANEL
ALT: 32 IU/L (ref 0–44)
AST: 27 IU/L (ref 0–40)
Albumin/Globulin Ratio: 1.5 (ref 1.2–2.2)
Albumin: 4.6 g/dL (ref 3.8–4.9)
Alkaline Phosphatase: 67 IU/L (ref 44–121)
BUN/Creatinine Ratio: 19 (ref 10–24)
BUN: 19 mg/dL (ref 8–27)
Bilirubin Total: 0.6 mg/dL (ref 0.0–1.2)
CO2: 23 mmol/L (ref 20–29)
Calcium: 9.7 mg/dL (ref 8.6–10.2)
Chloride: 96 mmol/L (ref 96–106)
Creatinine, Ser: 1.02 mg/dL (ref 0.76–1.27)
GFR calc Af Amer: 92 mL/min/{1.73_m2} (ref >59)
GFR calc non Af Amer: 80 mL/min/{1.73_m2} (ref >59)
Globulin, Total: 3.1 g/dL (ref 1.5–4.5)
Glucose: 120 mg/dL — ABNORMAL HIGH (ref 65–99)
Potassium: 4.3 mmol/L (ref 3.5–5.2)
Sodium: 137 mmol/L (ref 134–144)
Total Protein: 7.7 g/dL (ref 6.0–8.5)

## 2021-01-21 LAB — TSH: TSH: 1.01 u[IU]/mL (ref 0.450–4.500)

## 2021-01-21 LAB — HIV ANTIBODY (ROUTINE TESTING W REFLEX): HIV Screen 4th Generation wRfx: NONREACTIVE

## 2021-02-10 ENCOUNTER — Other Ambulatory Visit: Payer: Self-pay | Admitting: Physician Assistant

## 2021-02-24 DIAGNOSIS — M5417 Radiculopathy, lumbosacral region: Secondary | ICD-10-CM | POA: Diagnosis not present

## 2021-02-24 DIAGNOSIS — Z6841 Body Mass Index (BMI) 40.0 and over, adult: Secondary | ICD-10-CM | POA: Diagnosis not present

## 2021-02-24 DIAGNOSIS — I10 Essential (primary) hypertension: Secondary | ICD-10-CM | POA: Diagnosis not present

## 2021-03-08 DIAGNOSIS — D141 Benign neoplasm of larynx: Secondary | ICD-10-CM | POA: Diagnosis not present

## 2021-03-08 DIAGNOSIS — K219 Gastro-esophageal reflux disease without esophagitis: Secondary | ICD-10-CM | POA: Diagnosis not present

## 2021-03-12 DIAGNOSIS — M5417 Radiculopathy, lumbosacral region: Secondary | ICD-10-CM | POA: Diagnosis not present

## 2021-03-12 DIAGNOSIS — M4807 Spinal stenosis, lumbosacral region: Secondary | ICD-10-CM | POA: Diagnosis not present

## 2021-03-29 DIAGNOSIS — G4733 Obstructive sleep apnea (adult) (pediatric): Secondary | ICD-10-CM | POA: Diagnosis not present

## 2021-03-31 ENCOUNTER — Other Ambulatory Visit: Payer: Self-pay | Admitting: Family Medicine

## 2021-03-31 DIAGNOSIS — I10 Essential (primary) hypertension: Secondary | ICD-10-CM

## 2021-03-31 NOTE — Telephone Encounter (Signed)
Requested Prescriptions  Pending Prescriptions Disp Refills  . hydrochlorothiazide (HYDRODIURIL) 25 MG tablet [Pharmacy Med Name: HYDROCHLOROTHIAZIDE 25 MG TAB] 90 tablet 1    Sig: TAKE 1 TABLET BY MOUTH EVERY DAY     Cardiovascular: Diuretics - Thiazide Passed - 03/31/2021  1:25 AM      Passed - Ca in normal range and within 360 days    Calcium  Date Value Ref Range Status  01/20/2021 9.7 8.6 - 10.2 mg/dL Final   Calcium, Total  Date Value Ref Range Status  08/11/2014 8.5 8.5 - 10.1 mg/dL Final         Passed - Cr in normal range and within 360 days    Creatinine  Date Value Ref Range Status  08/11/2014 1.01 0.60 - 1.30 mg/dL Final   Creatinine, Ser  Date Value Ref Range Status  01/20/2021 1.02 0.76 - 1.27 mg/dL Final         Passed - K in normal range and within 360 days    Potassium  Date Value Ref Range Status  01/20/2021 4.3 3.5 - 5.2 mmol/L Final  08/11/2014 4.1 3.5 - 5.1 mmol/L Final         Passed - Na in normal range and within 360 days    Sodium  Date Value Ref Range Status  01/20/2021 137 134 - 144 mmol/L Final  08/11/2014 138 136 - 145 mmol/L Final         Passed - Last BP in normal range    BP Readings from Last 1 Encounters:  01/20/21 130/82         Passed - Valid encounter within last 6 months    Recent Outpatient Visits          2 months ago Annual physical exam   [facility] Fenton Malling M, PA-C   5 months ago Type 2 diabetes mellitus without complication, without long-term current use of insulin [facility])   [place], [place], [place]   8 months ago Hyperglycemia   [facility] Fenton Malling M, [place]   11 months ago Foreign body of right ear, initial encounter   [place], Clearnce Sorrel, [place]   1 year ago Postoperative fever   [employer] [facility], Dionne Bucy, MD      Future Appointments            In 3 months [place], Ronda Fairly, MD  [facility]

## 2021-04-01 ENCOUNTER — Other Ambulatory Visit: Payer: Self-pay | Admitting: Urology

## 2021-04-01 ENCOUNTER — Other Ambulatory Visit: Payer: Self-pay | Admitting: Physician Assistant

## 2021-04-01 DIAGNOSIS — Z1211 Encounter for screening for malignant neoplasm of colon: Secondary | ICD-10-CM | POA: Diagnosis not present

## 2021-04-01 DIAGNOSIS — Z01818 Encounter for other preprocedural examination: Secondary | ICD-10-CM | POA: Diagnosis not present

## 2021-04-05 NOTE — Telephone Encounter (Signed)
Notes to clinic: medication that has been requested is not on current medication list  Medication has been d/c    Requested Prescriptions  Pending Prescriptions Disp Refills   FARXIGA 10 MG TABS tablet [Pharmacy Med Name: FARXIGA 10 MG TABLET] 90 tablet 0    Sig: TAKE 1 TABLET BY MOUTH DAILY BEFORE BREAKFAST.      Endocrinology:  Diabetes - SGLT2 Inhibitors Failed - 04/01/2021  5:42 PM      Failed - LDL in normal range and within 360 days    LDL Chol Calc (NIH)  Date Value Ref Range Status  01/20/2021 62 0 - 99 mg/dL Final          Passed - Cr in normal range and within 360 days    Creatinine  Date Value Ref Range Status  08/11/2014 1.01 0.60 - 1.30 mg/dL Final   Creatinine, Ser  Date Value Ref Range Status  01/20/2021 1.02 0.76 - 1.27 mg/dL Final          Passed - HBA1C is between 0 and 7.9 and within 180 days    Hgb A1c MFr Bld  Date Value Ref Range Status  01/20/2021 6.1 (H) 4.8 - 5.6 % Final    Comment:             Prediabetes: 5.7 - 6.4          Diabetes: >6.4          Glycemic control for adults with diabetes: <7.0           Passed - eGFR in normal range and within 360 days    EGFR (African American)  Date Value Ref Range Status  08/11/2014 >60  Final   GFR calc Af Amer  Date Value Ref Range Status  01/20/2021 92 >59 mL/min/1.73 Final    Comment:    **In accordance with recommendations from the NKF-ASN Task force,**   [facility] is in the process of updating its eGFR calculation to the   2021 CKD-EPI creatinine equation that estimates kidney function   without a race variable.    EGFR (Non-African Amer.)  Date Value Ref Range Status  08/11/2014 >60  Final    Comment:    eGFR values <44m/min/1.73 m2 may be an indication of chronic kidney disease (CKD). Calculated eGFR is useful in patients with stable renal function. The eGFR calculation will not be reliable in acutely ill patients when serum creatinine is changing rapidly. It is not useful in   patients on dialysis. The eGFR calculation may not be applicable to patients at the low and high extremes of body sizes, pregnant women, and vegetarians.    GFR, Estimated  Date Value Ref Range Status  11/20/2020 >60 >60 mL/min Final    Comment:    (NOTE) Calculated using the CKD-EPI Creatinine Equation (2021)    GFR calc non Af Amer  Date Value Ref Range Status  01/20/2021 80 >59 mL/min/1.73 Final          Passed - Valid encounter within last 6 months    Recent Outpatient Visits           2 months ago [facility]   5 months ago Type 2 diabetes mellitus without complication, without long-term current use of insulin ([facility]   [place]  8 months ago Hyperglycemia   [facility] [place]  12 months ago  Foreign body of right ear, initial encounter   [place], Clearnce Sorrel, [place]   1 year ago Postoperative fever   [facility] [place], Dionne Bucy, MD       Future Appointments             In 2 months [place], Ronda Fairly, MD [facility]

## 2021-05-13 ENCOUNTER — Other Ambulatory Visit: Payer: Self-pay | Admitting: Physician Assistant

## 2021-05-19 ENCOUNTER — Other Ambulatory Visit: Payer: Self-pay | Admitting: Urology

## 2021-05-21 ENCOUNTER — Other Ambulatory Visit: Payer: Self-pay | Admitting: Family Medicine

## 2021-05-21 DIAGNOSIS — I10 Essential (primary) hypertension: Secondary | ICD-10-CM

## 2021-06-03 MED ORDER — METOPROLOL TARTRATE 25 MG PO TABS: 25.0000 mg | ORAL_TABLET | Freq: Every day | ORAL | 0 refills | Status: DC

## 2021-06-03 NOTE — Addendum Note (Signed)
Addended by: Jefferson Fuel on: 06/03/2021 08:03 AM   Modules accepted: Orders

## 2021-06-24 ENCOUNTER — Other Ambulatory Visit: Payer: BC Managed Care – PPO

## 2021-06-28 DIAGNOSIS — G4733 Obstructive sleep apnea (adult) (pediatric): Secondary | ICD-10-CM | POA: Diagnosis not present

## 2021-06-30 ENCOUNTER — Encounter: Payer: Self-pay | Admitting: Urology

## 2021-06-30 ENCOUNTER — Ambulatory Visit: Payer: BC Managed Care – PPO | Admitting: Urology

## 2021-06-30 ENCOUNTER — Other Ambulatory Visit: Payer: Self-pay

## 2021-06-30 VITALS — BP 165/83 | HR 103 | Ht 66.0 in | Wt 270.0 lb

## 2021-06-30 DIAGNOSIS — N401 Enlarged prostate with lower urinary tract symptoms: Secondary | ICD-10-CM | POA: Diagnosis not present

## 2021-06-30 DIAGNOSIS — N200 Calculus of kidney: Secondary | ICD-10-CM

## 2021-06-30 DIAGNOSIS — C61 Malignant neoplasm of prostate: Secondary | ICD-10-CM

## 2021-06-30 MED ORDER — TAMSULOSIN HCL 0.4 MG PO CAPS
ORAL_CAPSULE | ORAL | 3 refills | Status: DC
Start: 2021-06-30 — End: 2022-06-21

## 2021-06-30 MED ORDER — FINASTERIDE 5 MG PO TABS: 5.0000 mg | ORAL_TABLET | Freq: Every day | ORAL | 3 refills | Status: DC

## 2021-06-30 NOTE — Progress Notes (Signed)
06/30/2021 10:24 AM   Justin Soto 11-14-1961 [id number]  Referring provider: Jerrol Banana., MD [address] [place],  [place] [address]  Chief Complaint  Patient presents with   Prostate Cancer    Urologic history: 1.  T1c low risk prostate cancer Biopsy 07/2018; abnormal PSA velocity (2.0 uncorrected); 48 g  4/12 cores; Gleason 3+3 adenocarcinoma Elected active surveillance MRI 08/2020; 31 g prostate; no high-grade lesions noted Confirmatory biopsy 12/09/2020; 31 g volume  1/12 cores; Gleason 3+3 adenocarcinoma 5%  2.  BPH with LUTS Combination therapy tamsulosin/finasteride  3.  History stone disease Prior SWL CT 06/2017 with 3 mm right upper pole calculus   HPI: 60 y.o. male presents for annual follow-up.  No bothersome LUTS Remains on tamsulosin/finasteride Denies dysuria, gross hematuria Denies flank, abdominal or pelvic pain     PMH: Past Medical History:  Diagnosis Date   Anemia    Arthritis    Diabetes mellitus without complication (HCC)    diet controlled   Dyspnea    Enlarged prostate    hx of   Environmental and seasonal allergies    Frequency of urination    GERD (gastroesophageal reflux disease)    History of kidney stones    HOH (hard of hearing)    wears bilateral hearing aids   Hypertension    Irregular heart beat    Pt feels this every once in a while; PCP aware but stated it was OK   Kidney stone on right side 01/15/2017   Leg pain, right    Take Lyrica and Naproxen   MRSA (methicillin resistant staph aureus) culture positive    Neck pain of over 3 months duration    Prostate cancer ([place])    Sleep apnea    cpap-15 setting   Thalassemia    Tick fever     Surgical History: Past Surgical History:  Procedure Laterality Date   ANTERIOR CERVICAL DECOMP/DISCECTOMY FUSION N/A 11/13/2014   Procedure: Cervical three-four, Cervical six-seven anterior cervical decompression with fusion interbody prosthesis plating  and bonegraft;  Surgeon: Newman Pies, MD;  Location: [facility];  Service: Neurosurgery;  Laterality: N/A;  Cervical three-four, Cervical six-seven anterior cervical decompression with fusion interbody prosthesis plating and bonegraft   BACK SURGERY     Lumbar X 2   CALCANEAL OSTEOTOMY Right 01/11/2017   Procedure: CALCANEAL OSTEOTOMY;  Surgeon: Earnestine Leys, MD;  Location: [facility];  Service: Orthopedics;  Laterality: Right;   CIRCUMCISION N/A 12/13/2017   Procedure: PENILE BLOCK, LYSIS OF PENILE ADHESIONS;  Surgeon: Hollice Espy, MD;  Location: [facility];  Service: Urology;  Laterality: N/A;   COLONOSCOPY     CYSTOSCOPY N/A 12/13/2017   Procedure: CYSTOSCOPY;  Surgeon: Hollice Espy, MD;  Location: [facility];  Service: Urology;  Laterality: N/A;   DIRECT LARYNGOSCOPY N/A 11/24/2020   Procedure: MICRO DIRECT LARYNGOSCOPY W/BIOPSY;  Surgeon: Beverly Gust, MD;  Location: [facility];  Service: ENT;  Laterality: N/A;   HERNIA REPAIR Left    groin   LEG SURGERY Right    Had surgery on right calf X 7   LITHOTRIPSY     LUMBAR WOUND DEBRIDEMENT N/A 11/28/2019   Procedure: LUMBAR WOUND DEBRIDEMENT;  Surgeon: Newman Pies, MD;  Location: [facility];  Service: Neurosurgery;  Laterality: N/A;  LUMBAR WOUND DEBRIDEMENT   QUADRICEPS TENDON REPAIR Left 06/22/2015   Procedure: REPAIR QUADRICEP TENDON;  Surgeon: Earnestine Leys, MD;  Location: [facility];  Service: Orthopedics;  Laterality: Left;  QUADRICEPS TENDON REPAIR Left 08/12/2015   Procedure: REPAIR QUADRICEP TENDON;  Surgeon: Earnestine Leys, MD;  Location: [facility];  Service: Orthopedics;  Laterality: Left;   ROTATOR CUFF REPAIR Left    TONSILLECTOMY     VASECTOMY     with hydrocele repaired    Home Medications:  Allergies as of 06/30/2021       Reactions   Methocarbamol Other (See Comments)   Numbness, and change of taste    Tizanidine Nausea Only   Vicodin [hydrocodone-acetaminophen] Itching   Can take with benadryl         Medication  List        Accurate as of June 30, 2021 10:24 AM. If you have any questions, ask your nurse or doctor.          amLODipine-benazepril 10-40 MG capsule Commonly known as: LOTREL TAKE 1 CAPSULE BY MOUTH EVERY DAY   aspirin EC 81 MG tablet Take 81 mg by mouth daily.   atorvastatin 40 MG tablet Commonly known as: LIPITOR Take 40 mg by mouth daily.   cetirizine 10 MG tablet Commonly known as: ZYRTEC TAKE ONE TO TWO TABLETS BY MOUTH EVERY DAY AS NEEDED. What changed:  how much to take how to take this when to take this additional instructions   cyclobenzaprine 10 MG tablet Commonly known as: FLEXERIL Take 1 tablet (10 mg total) by mouth 3 (three) times daily as needed for muscle spasms.   Farxiga 10 MG Tabs tablet Generic drug: dapagliflozin propanediol TAKE 1 TABLET BY MOUTH EVERY DAY BEFORE BREAKFAST   finasteride 5 MG tablet Commonly known as: PROSCAR TAKE 1 TABLET BY MOUTH EVERY DAY   fluticasone 50 MCG/ACT nasal spray Commonly known as: FLONASE USE TWO SPRAYS IN EACH NOSTRIL EVERY DAY   gabapentin 300 MG capsule Commonly known as: NEURONTIN Take 300-600 mg by mouth See admin instructions. 600 mg in the morning, and 300 mg at bedtime   hydrochlorothiazide 25 MG tablet Commonly known as: HYDRODIURIL TAKE 1 TABLET BY MOUTH EVERY DAY   isosorbide mononitrate 30 MG 24 hr tablet Commonly known as: IMDUR Take 30 mg by mouth daily.   meloxicam 15 MG tablet Commonly known as: MOBIC Take 15 mg by mouth daily.   metoprolol tartrate 25 MG tablet Commonly known as: LOPRESSOR Take 1 tablet (25 mg total) by mouth daily.   montelukast 10 MG tablet Commonly known as: SINGULAIR TAKE 1 TABLET BY MOUTH EVERY DAY IN THE MORNING   multivitamin with minerals Tabs tablet Take 1 tablet by mouth daily.   omeprazole 20 MG capsule Commonly known as: PRILOSEC Take 20 mg by mouth daily.   tamsulosin 0.4 MG Caps capsule Commonly known as: FLOMAX TAKE 2 CAPSULES BY  MOUTH EVERY DAY AS DIRECTED        Allergies:  Allergies  Allergen Reactions   Methocarbamol Other (See Comments)    Numbness, and change of taste    Tizanidine Nausea Only   Vicodin [Hydrocodone-Acetaminophen] Itching    Can take with benadryl     Family History: Family History  Problem Relation Age of Onset   Prostate cancer Father    Heart disease Father    Diabetes Father    Alzheimer's disease Father    Anemia Mother    Osteoporosis Mother    Healthy Sister    Diabetes Brother    Hypertension Brother    Anemia Daughter    Diabetes Brother    Kidney disease Neg Hx  Bladder Cancer Neg Hx    Colon cancer Neg Hx     Social History:  reports that he has never smoked. He has never used smokeless tobacco. He reports that he does not drink alcohol and does not use drugs.   Physical Exam: BP (!) 165/83   Pulse (!) 103   Ht 5\' 6"  (1.676 m)   Wt 270 lb (122.5 kg)   BMI 43.58 kg/m   Constitutional:  Alert and oriented, No acute distress. HEENT: [place] AT, moist mucus membranes.  Trachea midline, no masses. Cardiovascular: No clubbing, cyanosis, or edema. Respiratory: Normal respiratory effort, no increased work of breathing GU: Prostate 30 g, smooth without nodules   Assessment & Plan:    1.  T1c low risk prostate cancer Benign DRE Desires to continue active surveillance PSA drawn today and if stable follow-up lab visit with PSA 6 months 1 year follow-up PSA/DRE  2.  BPH with LUTS Stable on tamsulosin/finasteride Refill sent to pharmacy  3.  Nephrolithiasis Stable   Abbie Sons, MD  [facility] [address] [place], [place] [address] [phone]

## 2021-07-01 LAB — PSA: Prostate Specific Ag, Serum: 2 ng/mL (ref 0.0–4.0)

## 2021-07-02 ENCOUNTER — Encounter: Payer: Self-pay | Admitting: *Deleted

## 2021-08-18 DIAGNOSIS — M461 Sacroiliitis, not elsewhere classified: Secondary | ICD-10-CM | POA: Diagnosis not present

## 2021-08-30 ENCOUNTER — Other Ambulatory Visit: Payer: Self-pay | Admitting: Family Medicine

## 2021-08-31 DIAGNOSIS — M4807 Spinal stenosis, lumbosacral region: Secondary | ICD-10-CM | POA: Diagnosis not present

## 2021-09-14 ENCOUNTER — Other Ambulatory Visit: Payer: Self-pay | Admitting: Family Medicine

## 2021-09-14 ENCOUNTER — Encounter: Payer: Self-pay | Admitting: Internal Medicine

## 2021-09-14 NOTE — Telephone Encounter (Signed)
Appointment 10/11/21.

## 2021-09-15 ENCOUNTER — Ambulatory Visit: Payer: BC Managed Care – PPO | Admitting: Anesthesiology

## 2021-09-15 ENCOUNTER — Encounter
Admission: RE | Disposition: A | Discharge status: Home / Self Care | Payer: Self-pay | Source: Home / Self Care | Attending: Internal Medicine

## 2021-09-15 ENCOUNTER — Other Ambulatory Visit: Payer: Self-pay

## 2021-09-15 ENCOUNTER — Encounter: Payer: Self-pay | Admitting: Internal Medicine

## 2021-09-15 ENCOUNTER — Ambulatory Visit
Admission: RE | Admit: 2021-09-15 | Discharge: 2021-09-15 | Disposition: A | Discharge status: Home / Self Care | Payer: BC Managed Care – PPO | Attending: Internal Medicine | Admitting: Internal Medicine

## 2021-09-15 DIAGNOSIS — Z1211 Encounter for screening for malignant neoplasm of colon: Secondary | ICD-10-CM | POA: Insufficient documentation

## 2021-09-15 DIAGNOSIS — K219 Gastro-esophageal reflux disease without esophagitis: Secondary | ICD-10-CM | POA: Insufficient documentation

## 2021-09-15 DIAGNOSIS — Z8546 Personal history of malignant neoplasm of prostate: Secondary | ICD-10-CM | POA: Diagnosis not present

## 2021-09-15 DIAGNOSIS — K5730 Diverticulosis of large intestine without perforation or abscess without bleeding: Secondary | ICD-10-CM | POA: Insufficient documentation

## 2021-09-15 DIAGNOSIS — I10 Essential (primary) hypertension: Secondary | ICD-10-CM | POA: Diagnosis not present

## 2021-09-15 DIAGNOSIS — Z7982 Long term (current) use of aspirin: Secondary | ICD-10-CM | POA: Diagnosis not present

## 2021-09-15 DIAGNOSIS — Z7984 Long term (current) use of oral hypoglycemic drugs: Secondary | ICD-10-CM | POA: Diagnosis not present

## 2021-09-15 DIAGNOSIS — G4733 Obstructive sleep apnea (adult) (pediatric): Secondary | ICD-10-CM | POA: Diagnosis not present

## 2021-09-15 DIAGNOSIS — G4730 Sleep apnea, unspecified: Secondary | ICD-10-CM | POA: Insufficient documentation

## 2021-09-15 DIAGNOSIS — Z791 Long term (current) use of non-steroidal anti-inflammatories (NSAID): Secondary | ICD-10-CM | POA: Diagnosis not present

## 2021-09-15 DIAGNOSIS — Z885 Allergy status to narcotic agent status: Secondary | ICD-10-CM | POA: Diagnosis not present

## 2021-09-15 DIAGNOSIS — Z87442 Personal history of urinary calculi: Secondary | ICD-10-CM | POA: Insufficient documentation

## 2021-09-15 DIAGNOSIS — K640 First degree hemorrhoids: Secondary | ICD-10-CM | POA: Insufficient documentation

## 2021-09-15 DIAGNOSIS — N400 Enlarged prostate without lower urinary tract symptoms: Secondary | ICD-10-CM | POA: Insufficient documentation

## 2021-09-15 DIAGNOSIS — Z79899 Other long term (current) drug therapy: Secondary | ICD-10-CM | POA: Insufficient documentation

## 2021-09-15 DIAGNOSIS — Z888 Allergy status to other drugs, medicaments and biological substances status: Secondary | ICD-10-CM | POA: Insufficient documentation

## 2021-09-15 DIAGNOSIS — Z6841 Body Mass Index (BMI) 40.0 and over, adult: Secondary | ICD-10-CM | POA: Diagnosis not present

## 2021-09-15 DIAGNOSIS — E119 Type 2 diabetes mellitus without complications: Secondary | ICD-10-CM | POA: Insufficient documentation

## 2021-09-15 DIAGNOSIS — K648 Other hemorrhoids: Secondary | ICD-10-CM | POA: Diagnosis not present

## 2021-09-15 HISTORY — PX: COLONOSCOPY WITH PROPOFOL: SHX5780

## 2021-09-15 LAB — HM COLONOSCOPY

## 2021-09-15 LAB — GLUCOSE, CAPILLARY: Glucose-Capillary: 149 mg/dL — ABNORMAL HIGH (ref 70–99)

## 2021-09-15 SURGERY — COLONOSCOPY WITH PROPOFOL
Anesthesia: General

## 2021-09-15 MED ORDER — LIDOCAINE 2% (20 MG/ML) 5 ML SYRINGE
INTRAMUSCULAR | Status: DC | PRN
  Administered 2021-09-15: 50 mg via INTRAVENOUS

## 2021-09-15 MED ORDER — SODIUM CHLORIDE 0.9 % IV SOLN: INTRAVENOUS | Status: DC

## 2021-09-15 MED ORDER — PROPOFOL 500 MG/50ML IV EMUL
INTRAVENOUS | Status: DC | PRN
  Administered 2021-09-15: 150 ug/kg/min via INTRAVENOUS

## 2021-09-15 MED ORDER — PROPOFOL 500 MG/50ML IV EMUL
INTRAVENOUS | Status: AC
  Filled 2021-09-15: qty 50

## 2021-09-15 MED ORDER — PROPOFOL 10 MG/ML IV BOLUS
INTRAVENOUS | Status: DC | PRN
  Administered 2021-09-15: 80 mg via INTRAVENOUS
  Administered 2021-09-15: 20 mg via INTRAVENOUS

## 2021-09-15 NOTE — Anesthesia Preprocedure Evaluation (Signed)
Anesthesia Evaluation  Patient identified by MRN, date of birth, ID band Patient awake    Reviewed: Allergy & Precautions, H&P , NPO status , Patient's Chart, lab work & pertinent test results  History of Anesthesia Complications Negative for: history of anesthetic complications  Airway Mallampati: III  TM Distance: >3 FB Neck ROM: limited    Dental  (+) Chipped, Dental Advidsory Given   Pulmonary shortness of breath, sleep apnea , neg COPD, neg recent URI,    breath sounds clear to auscultation       Cardiovascular hypertension, (-) angina(-) Past MI and (-) Cardiac Stents (-) dysrhythmias  Rhythm:regular Rate:Normal     Neuro/Psych PSYCHIATRIC DISORDERS Depression negative neurological ROS     GI/Hepatic Neg liver ROS, GERD  ,  Endo/Other  diabetesMorbid obesity  Renal/GU Renal disease (kidney stone)     Musculoskeletal   Abdominal   Peds  Hematology  (+) Blood dyscrasia, anemia , Hgb 11.8   Anesthesia Other Findings Past Medical History: No date: Anemia No date: Arthritis No date: Diabetes mellitus without complication (HCC)     Comment:  diet controlled No date: Dyspnea No date: Enlarged prostate     Comment:  hx of No date: Environmental and seasonal allergies No date: Frequency of urination No date: GERD (gastroesophageal reflux disease) No date: History of kidney stones No date: HOH (hard of hearing)     Comment:  wears bilateral hearing aids No date: Hypertension No date: Irregular heart beat     Comment:  Pt feels this every once in a while; PCP aware but               stated it was OK 01/15/2017: Kidney stone on right side No date: Leg pain, right     Comment:  Take Lyrica and Naproxen No date: MRSA (methicillin resistant staph aureus) culture positive No date: Neck pain of over 3 months duration No date: Prostate cancer ([place]) No date: Sleep apnea     Comment:  cpap-15 setting No date:  Thalassemia No date: Tick fever  Past Surgical History: 11/13/2014: ANTERIOR CERVICAL DECOMP/DISCECTOMY FUSION; N/A     Comment:  Procedure: Cervical three-four, Cervical six-seven               anterior cervical decompression with fusion interbody               prosthesis plating and bonegraft;  Surgeon: Newman Pies, MD;  Location: [facility];  Service: Neurosurgery;                Laterality: N/A;  Cervical three-four, Cervical six-seven              anterior cervical decompression with fusion interbody               prosthesis plating and bonegraft No date: BACK SURGERY     Comment:  Lumbar X 2 01/11/2017: CALCANEAL OSTEOTOMY; Right     Comment:  Procedure: CALCANEAL OSTEOTOMY;  Surgeon: Earnestine Leys,              MD;  Location: [facility];  Service: Orthopedics;                Laterality: Right; 12/13/2017: CIRCUMCISION; N/A     Comment:  Procedure: PENILE BLOCK, LYSIS OF PENILE ADHESIONS;                Surgeon: Hollice Espy,  MD;  Location: [facility];                Service: Urology;  Laterality: N/A; No date: COLONOSCOPY 12/13/2017: CYSTOSCOPY; N/A     Comment:  Procedure: CYSTOSCOPY;  Surgeon: Hollice Espy, MD;                Location: [facility];  Service: Urology;  Laterality: N/A; No date: HERNIA REPAIR; Left     Comment:  groin No date: LEG SURGERY; Right     Comment:  Had surgery on right calf X 7 No date: LITHOTRIPSY 11/28/2019: LUMBAR WOUND DEBRIDEMENT; N/A     Comment:  Procedure: LUMBAR WOUND DEBRIDEMENT;  Surgeon: Newman Pies, MD;  Location: [facility];  Service: Neurosurgery;                Laterality: N/A;  LUMBAR WOUND DEBRIDEMENT 06/22/2015: QUADRICEPS TENDON REPAIR; Left     Comment:  Procedure: REPAIR QUADRICEP TENDON;  Surgeon: Earnestine Leys, MD;  Location: [facility];  Service: Orthopedics;                Laterality: Left; 08/12/2015: [place]; Left     Comment:  Procedure: REPAIR QUADRICEP TENDON;   Surgeon: Earnestine Leys, MD;  Location: [facility];  Service: Orthopedics;                Laterality: Left; No date: ROTATOR CUFF REPAIR; Left No date: TONSILLECTOMY No date: VASECTOMY     Comment:  with hydrocele repaired  BMI    Body Mass Index: 41.97 kg/m      Reproductive/Obstetrics negative OB ROS                             Anesthesia Physical  Anesthesia Plan  ASA: 3  Anesthesia Plan: General   Post-op Pain Management:    Induction: Intravenous  PONV Risk Score and Plan: Treatment may vary due to age or medical condition, TIVA and Propofol infusion  Airway Management Planned: Natural Airway and Nasal Cannula  Additional Equipment:   Intra-op Plan:   Post-operative Plan:   Informed Consent: I have reviewed the patients History and Physical, chart, labs and discussed the procedure including the risks, benefits and alternatives for the proposed anesthesia with the patient or authorized representative who has indicated his/her understanding and acceptance.     Dental Advisory Given  Plan Discussed with: Anesthesiologist, CRNA and Surgeon  Anesthesia Plan Comments: (MLT tube)        Anesthesia Quick Evaluation

## 2021-09-15 NOTE — Transfer of Care (Signed)
Immediate Anesthesia Transfer of Care Note  Patient: Justin Soto  Procedure(s) Performed: COLONOSCOPY WITH PROPOFOL  Patient Location: Endoscopy Unit  Anesthesia Type:General  Level of Consciousness: awake  Airway & Oxygen Therapy: Patient Spontanous Breathing  Post-op Assessment: Report given to RN  Post vital signs: stable  Last Vitals:  Vitals Value Taken Time  BP 122/66   Temp    Pulse 73 09/15/21 0909  Resp 18 09/15/21 0909  SpO2 94 % 09/15/21 0909  Vitals shown include unvalidated device data.  Last Pain:  Vitals:   09/15/21 0813  TempSrc: Temporal  PainSc: 0-No pain         Complications: No notable events documented.

## 2021-09-15 NOTE — Op Note (Signed)
[facility] Gastroenterology Patient Name: Justin Soto Procedure Date: 09/15/2021 8:13 AM MRN: [id number] Account #: [id number] Date of Birth: 1961/08/27 Admit Type: Outpatient Age: 60 Room: [facility] ENDO ROOM 2 Gender: Male Note Status: Finalized Instrument Name: Jasper Riling [id number] Procedure:             Colonoscopy Indications:           Screening for colorectal malignant neoplasm Providers:             Lorie Apley K. Alice Reichert MD, MD Referring MD:          Janine Ores. Rosanna Randy, MD (Referring MD) Medicines:             Propofol per Anesthesia Complications:         No immediate complications. Procedure:             Pre-Anesthesia Assessment:                        - The risks and benefits of the procedure and the                         sedation options and risks were discussed with the                         patient. All questions were answered and informed                         consent was obtained.                        - Patient identification and proposed procedure were                         verified prior to the procedure by the nurse. The                         procedure was verified in the procedure room.                        - ASA Grade Assessment: III - A patient with severe                         systemic disease.                        - After reviewing the risks and benefits, the patient                         was deemed in satisfactory condition to undergo the                         procedure.                        After obtaining informed consent, the colonoscope was                         passed under direct vision. Throughout the procedure,  the patient's blood pressure, pulse, and oxygen                         saturations were monitored continuously. The                         Colonoscope was introduced through the anus and                         advanced to the the cecum, identified by appendiceal                          orifice and ileocecal valve. The colonoscopy was                         performed without difficulty. The patient tolerated                         the procedure well. The quality of the bowel                         preparation was excellent. The ileocecal valve,                         appendiceal orifice, and rectum were photographed. Findings:      The perianal and digital rectal examinations were normal. Pertinent       negatives include normal sphincter tone and no palpable rectal lesions.      Non-bleeding internal hemorrhoids were found during retroflexion. The       hemorrhoids were Grade I (internal hemorrhoids that do not prolapse).      Many small and large-mouthed diverticula were found in the sigmoid       colon. There was no evidence of diverticular bleeding.      The exam was otherwise without abnormality. Impression:            - Non-bleeding internal hemorrhoids.                        - Mild diverticulosis in the sigmoid colon. There was                         no evidence of diverticular bleeding.                        - The examination was otherwise normal.                        - No specimens collected. Recommendation:        - Patient has a contact number available for                         emergencies. The signs and symptoms of potential                         delayed complications were discussed with the patient.                         Return to normal activities tomorrow. Written  discharge instructions were provided to the patient.                        - Resume previous diet.                        - Continue present medications.                        - Repeat colonoscopy in 10 years for screening                         purposes.                        - Return to GI office PRN.                        - The findings and recommendations were discussed with                         the patient. Procedure Code(s):     ---  Professional ---                        Z6109, Colorectal cancer screening; colonoscopy on                         individual not meeting criteria for high risk Diagnosis Code(s):     --- Professional ---                        K57.30, Diverticulosis of large intestine without                         perforation or abscess without bleeding                        K64.0, First degree hemorrhoids                        Z12.11, Encounter for screening for malignant neoplasm                         of colon CPT copyright 2019 American Medical Association. All rights reserved. The codes documented in this report are preliminary and upon coder review may  be revised to meet current compliance requirements. Efrain Sella MD, MD 09/15/2021 9:08:51 AM This report has been signed electronically. Number of Addenda: 0 Note Initiated On: 09/15/2021 8:13 AM Scope Withdrawal Time: 0 hours 5 minutes 50 seconds  Total Procedure Duration: 0 hours 7 minutes 54 seconds  Estimated Blood Loss:  Estimated blood loss: none.      [facility]

## 2021-09-15 NOTE — Interval H&P Note (Signed)
History and Physical Interval Note:  09/15/2021 8:45 AM  Justin Soto  has presented today for surgery, with the diagnosis of screening.  The various methods of treatment have been discussed with the patient and family. After consideration of risks, benefits and other options for treatment, the patient has consented to  Procedure(s) with comments: COLONOSCOPY WITH PROPOFOL (N/A) - DM as a surgical intervention.  The patient's history has been reviewed, patient examined, no change in status, stable for surgery.  I have reviewed the patient's chart and labs.  Questions were answered to the patient's satisfaction.     [place], [place]

## 2021-09-15 NOTE — H&P (Signed)
Outpatient short stay form Pre-procedure 09/15/2021 8:44 AM Justin Soto K. Alice Reichert, M.D.  Primary Physician: Miguel Aschoff, M.D.  Reason for visit:  Colon cancer screening  History of present illness:Patient presents for colonoscopy for colon cancer screening. The patient denies complaints of abdominal pain, significant change in bowel habits, or rectal bleeding.        Current Facility-Administered Medications:    0.9 %  sodium chloride infusion, , Intravenous, Continuous, Anatalia Kronk, Benay Pike, MD, Last Rate: 20 mL/hr at 09/15/21 0827, New Bag at 09/15/21 0827  Medications Prior to Admission  Medication Sig Dispense Refill Last Dose   amLODipine-benazepril (LOTREL) 10-40 MG capsule TAKE 1 CAPSULE BY MOUTH EVERY DAY 90 capsule 1 09/15/2021 at 0600   aspirin EC 81 MG tablet Take 81 mg by mouth daily.    Past Week   atorvastatin (LIPITOR) 40 MG tablet Take 40 mg by mouth daily.   09/14/2021 at 0800   cetirizine (ZYRTEC) 10 MG tablet TAKE ONE TO TWO TABLETS BY MOUTH EVERY DAY AS NEEDED. (Patient taking differently: Take 10 mg by mouth daily.) 180 tablet 4 09/14/2021 at 0800   FARXIGA 10 MG TABS tablet TAKE 1 TABLET BY MOUTH EVERY DAY BEFORE BREAKFAST 90 tablet 0 09/14/2021 at 0800   finasteride (PROSCAR) 5 MG tablet Take 1 tablet (5 mg total) by mouth daily. 90 tablet 3 09/15/2021 at 0600   fluticasone (FLONASE) 50 MCG/ACT nasal spray USE TWO SPRAYS IN EACH NOSTRIL EVERY DAY (Patient taking differently: Place 2 sprays into both nostrils daily.) 48 mL 4 09/14/2021 at 0800   gabapentin (NEURONTIN) 300 MG capsule Take 300-600 mg by mouth See admin instructions. 600 mg in the morning, and 300 mg at bedtime   09/14/2021 at 2200   hydrochlorothiazide (HYDRODIURIL) 25 MG tablet TAKE 1 TABLET BY MOUTH EVERY DAY 90 tablet 1 09/15/2021 at 0600   isosorbide mononitrate (IMDUR) 30 MG 24 hr tablet Take 30 mg by mouth daily.   09/15/2021 at 0600   meloxicam (MOBIC) 15 MG tablet Take 15 mg by mouth daily.   Past Week    metoprolol tartrate (LOPRESSOR) 25 MG tablet TAKE 1 TABLET (25 MG TOTAL) BY MOUTH DAILY. 90 tablet 0 09/15/2021 at 06000   montelukast (SINGULAIR) 10 MG tablet TAKE 1 TABLET BY MOUTH EVERY DAY IN THE MORNING 90 tablet 0 09/14/2021 at 0800   Multiple Vitamin (MULTIVITAMIN WITH MINERALS) TABS tablet Take 1 tablet by mouth daily.   09/14/2021 at 0800   omeprazole (PRILOSEC) 20 MG capsule Take 20 mg by mouth daily.    09/15/2021 at 0600   tamsulosin (FLOMAX) 0.4 MG CAPS capsule 2 capsules by mouth daily 180 capsule 3 09/14/2021 at 0800   cyclobenzaprine (FLEXERIL) 10 MG tablet Take 1 tablet (10 mg total) by mouth 3 (three) times daily as needed for muscle spasms. 50 tablet 1      Allergies  Allergen Reactions   Methocarbamol Other (See Comments)    Numbness, and change of taste    Tizanidine Nausea Only   Vicodin [Hydrocodone-Acetaminophen] Itching    Can take with benadryl      Past Medical History:  Diagnosis Date   Anemia    Arthritis    Diabetes mellitus without complication (HCC)    diet controlled   Dyspnea    Enlarged prostate    hx of   Environmental and seasonal allergies    Frequency of urination    GERD (gastroesophageal reflux disease)    History of kidney stones  HOH (hard of hearing)    wears bilateral hearing aids   Hypertension    Irregular heart beat    Pt feels this every once in a while; PCP aware but stated it was OK   Kidney stone on right side 01/15/2017   Leg pain, right    Take Lyrica and Naproxen   MRSA (methicillin resistant staph aureus) culture positive    Neck pain of over 3 months duration    Prostate cancer ([place])    Sleep apnea    cpap-15 setting   Thalassemia    Tick fever     Review of systems:  Otherwise negative.    Physical Exam  Gen: Alert, oriented. Appears stated age.  HEENT: [place]/AT. PERRLA. Lungs: CTA, no wheezes. CV: RR nl S1, S2. Abd: soft, benign, no masses. BS+ Ext: No edema. Pulses 2+    Planned procedures: Proceed with  colonoscopy. The patient understands the nature of the planned procedure, indications, risks, alternatives and potential complications including but not limited to bleeding, infection, perforation, damage to internal organs and possible oversedation/side effects from anesthesia. The patient agrees and gives consent to proceed.  Please refer to procedure notes for findings, recommendations and patient disposition/instructions.     Justin Soto K. Alice Reichert, M.D. Gastroenterology 09/15/2021  8:44 AM

## 2021-09-16 ENCOUNTER — Encounter: Payer: Self-pay | Admitting: Internal Medicine

## 2021-09-17 NOTE — Anesthesia Postprocedure Evaluation (Signed)
Anesthesia Post Note  Patient: Justin Soto  Procedure(s) Performed: COLONOSCOPY WITH PROPOFOL  Patient location during evaluation: Endoscopy Anesthesia Type: General Level of consciousness: awake and alert Pain management: pain level controlled Vital Signs Assessment: post-procedure vital signs reviewed and stable Respiratory status: spontaneous breathing, nonlabored ventilation, respiratory function stable and patient connected to nasal cannula oxygen Cardiovascular status: blood pressure returned to baseline and stable Postop Assessment: no apparent nausea or vomiting Anesthetic complications: no   No notable events documented.   Last Vitals:  Vitals:   09/15/21 0909 09/15/21 0929  BP: 122/66 126/75  Pulse:    Resp:    Temp: (!) 36.2 C   SpO2:      Last Pain:  Vitals:   09/16/21 0747  TempSrc:   PainSc: 0-No pain                 Martha Clan

## 2021-09-22 DIAGNOSIS — M461 Sacroiliitis, not elsewhere classified: Secondary | ICD-10-CM | POA: Diagnosis not present

## 2021-09-27 DIAGNOSIS — G4733 Obstructive sleep apnea (adult) (pediatric): Secondary | ICD-10-CM | POA: Diagnosis not present

## 2021-09-28 ENCOUNTER — Other Ambulatory Visit: Payer: Self-pay | Admitting: Family Medicine

## 2021-09-28 DIAGNOSIS — I10 Essential (primary) hypertension: Secondary | ICD-10-CM

## 2021-09-28 NOTE — Telephone Encounter (Signed)
Requested medications are due for refill today yes  Requested medications are on the active medication list yes  Last refill 06/26/21  Last visit 01/20/21  Future visit scheduled 10/11/21  Notes to clinic Failed protocol due to no valid visit within 6  months, please assess.  Requested Prescriptions  Pending Prescriptions Disp Refills   hydrochlorothiazide (HYDRODIURIL) 25 MG tablet [Pharmacy Med Name: HYDROCHLOROTHIAZIDE 25 MG TAB] 90 tablet 1    Sig: TAKE 1 TABLET BY MOUTH EVERY DAY     Cardiovascular: Diuretics - Thiazide Failed - 09/28/2021  1:24 AM      Failed - Valid encounter within last 6 months    Recent Outpatient Visits           8 months ago Annual physical exam   [facility] [place] M, [place]   11 months ago Type 2 diabetes mellitus without complication, without long-term current use of insulin [facility])   [facility], [place], [place]   1 year ago Hyperglycemia   [facility] [place], Clearnce Sorrel, [place]   1 year ago Foreign body of right ear, initial encounter   [facility] [place], [place], [place]   1 year ago Postoperative fever   [place], Dionne Bucy, MD       Future Appointments             In 1 week Jerrol Banana., MD [facility]   In 9 months Stoioff, Ronda Fairly, MD [facility]            Passed - Ca in normal range and within 360 days    Calcium  Date Value Ref Range Status  01/20/2021 9.7 8.6 - 10.2 mg/dL Final   Calcium, Total  Date Value Ref Range Status  08/11/2014 8.5 8.5 - 10.1 mg/dL Final          Passed - Cr in normal range and within 360 days    Creatinine  Date Value Ref Range Status  08/11/2014 1.01 0.60 - 1.30 mg/dL Final   Creatinine, Ser  Date Value Ref Range Status  01/20/2021 1.02 0.76 - 1.27 mg/dL Final          Passed - K in normal range and within 360 days     Potassium  Date Value Ref Range Status  01/20/2021 4.3 3.5 - 5.2 mmol/L Final  08/11/2014 4.1 3.5 - 5.1 mmol/L Final          Passed - Na in normal range and within 360 days    Sodium  Date Value Ref Range Status  01/20/2021 137 134 - 144 mmol/L Final  08/11/2014 138 136 - 145 mmol/L Final          Passed - Last BP in normal range    BP Readings from Last 1 Encounters:  09/15/21 126/75

## 2021-10-11 ENCOUNTER — Encounter: Payer: Self-pay | Admitting: Family Medicine

## 2021-10-11 ENCOUNTER — Other Ambulatory Visit: Payer: Self-pay

## 2021-10-11 ENCOUNTER — Ambulatory Visit: Payer: BC Managed Care – PPO | Admitting: Family Medicine

## 2021-10-11 VITALS — BP 128/77 | HR 91 | Temp 99.1°F | Resp 18 | Wt 287.0 lb

## 2021-10-11 DIAGNOSIS — G3184 Mild cognitive impairment, so stated: Secondary | ICD-10-CM

## 2021-10-11 DIAGNOSIS — C61 Malignant neoplasm of prostate: Secondary | ICD-10-CM

## 2021-10-11 DIAGNOSIS — Z23 Encounter for immunization: Secondary | ICD-10-CM

## 2021-10-11 DIAGNOSIS — K429 Umbilical hernia without obstruction or gangrene: Secondary | ICD-10-CM

## 2021-10-11 DIAGNOSIS — E119 Type 2 diabetes mellitus without complications: Secondary | ICD-10-CM | POA: Diagnosis not present

## 2021-10-11 DIAGNOSIS — I10 Essential (primary) hypertension: Secondary | ICD-10-CM | POA: Diagnosis not present

## 2021-10-11 DIAGNOSIS — G4733 Obstructive sleep apnea (adult) (pediatric): Secondary | ICD-10-CM

## 2021-10-11 DIAGNOSIS — K219 Gastro-esophageal reflux disease without esophagitis: Secondary | ICD-10-CM

## 2021-10-11 LAB — POCT GLYCOSYLATED HEMOGLOBIN (HGB A1C)
Est. average glucose Bld gHb Est-mCnc: 146
Hemoglobin A1C: 6.7 % — AB (ref 4.0–5.6)

## 2021-10-11 NOTE — Progress Notes (Signed)
Established patient visit   Patient: Justin Soto   DOB: Sep 13, 1961   60 y.o. Male  MRN: [id number] Visit Date: 10/11/2021  Today's healthcare provider: [name], MD   Chief Complaint  Patient presents with   Hypertension   Diabetes   Subjective    HPI  Longtime patient comes in today for follow-up.  I have not seen him for 3-1/2 years but he has not had other care.  He has chronic right LS radiculopathy he is diabetic he states that he has had some cognitive impairment recently.Marland Kitchen  He says he has trouble remembering names. He says he is gained a good bit of weight over the past year because of inactivity because of his back pain. Diabetes Mellitus Type II, Follow-up  Lab Results  Component Value Date   HGBA1C 6.1 (H) 01/20/2021   HGBA1C 6.3 (A) 10/19/2020   HGBA1C 7.4 (A) 07/17/2020   Wt Readings from Last 3 Encounters:  10/11/21 287 lb (130.2 kg)  09/15/21 275 lb (124.7 kg)  06/30/21 270 lb (122.5 kg)   Last seen for diabetes 8 months ago.  Management since then includes none. He reports good compliance with treatment. He is not having side effects.  Symptoms: No fatigue No foot ulcerations  No appetite changes No nausea  No paresthesia of the feet  No polydipsia  Yes polyuria No visual disturbances   No vomiting     Home blood sugar records: fasting range: 140-160  Episodes of hypoglycemia? No    Current insulin regiment: none Most Recent Eye Exam: Due   Pertinent Labs: Lab Results  Component Value Date   CHOL 117 01/20/2021   HDL 30 (L) 01/20/2021   LDLCALC 62 01/20/2021   TRIG 142 01/20/2021   Lab Results  Component Value Date   NA 137 01/20/2021   K 4.3 01/20/2021   CREATININE 1.02 01/20/2021   GFRNONAA 80 01/20/2021   LABMICR See below: 11/23/2017     ---------------------------------------------------------------------------------------------------   Hypertension, follow-up  BP Readings from Last 3 Encounters:  10/11/21  128/77  09/15/21 126/75  06/30/21 (!) 165/83   Wt Readings from Last 3 Encounters:  10/11/21 287 lb (130.2 kg)  09/15/21 275 lb (124.7 kg)  06/30/21 270 lb (122.5 kg)     He was last seen for hypertension 8 months ago.  BP at that visit was as above. Management since that visit includes none.  He reports good compliance with treatment. He is not having side effects.  He is following a Regular diet. He is not exercising. He does not smoke.  Use of agents associated with hypertension: NSAIDS.   Outside blood pressures are checked occasionally. Symptoms: No chest pain No chest pressure  No palpitations No syncope  No dyspnea No orthopnea  No paroxysmal nocturnal dyspnea No lower extremity edema   Pertinent labs: Lab Results  Component Value Date   CHOL 117 01/20/2021   HDL 30 (L) 01/20/2021   LDLCALC 62 01/20/2021   TRIG 142 01/20/2021   Lab Results  Component Value Date   NA 137 01/20/2021   K 4.3 01/20/2021   CREATININE 1.02 01/20/2021   GFRNONAA 80 01/20/2021   GLUCOSE 120 (H) 01/20/2021   TSH 1.010 01/20/2021     The ASCVD Risk score (Arnett DK, et al., 2019) failed to calculate for the following reasons:   The valid total cholesterol range is 130 to 320 mg/dL   ---------------------------------------------------------------------------------------------------  MMSE - Mini Mental State  Exam 10/11/2021  Orientation to time 5  Orientation to Place 5  Registration 3  Attention/ Calculation 4  Recall 3  Language- name 2 objects 2  Language- repeat 1  Language- follow 3 step command 3  Language- read & follow direction 1  Write a sentence 1  Copy design 1  Total score 29       Medications: Outpatient Medications Prior to Visit  Medication Sig   amLODipine-benazepril (LOTREL) 10-40 MG capsule TAKE 1 CAPSULE BY MOUTH EVERY DAY   aspirin EC 81 MG tablet Take 81 mg by mouth daily.    atorvastatin (LIPITOR) 40 MG tablet Take 40 mg by mouth daily.    cetirizine (ZYRTEC) 10 MG tablet TAKE ONE TO TWO TABLETS BY MOUTH EVERY DAY AS NEEDED. (Patient taking differently: Take 10 mg by mouth daily.)   cyclobenzaprine (FLEXERIL) 10 MG tablet Take 1 tablet (10 mg total) by mouth 3 (three) times daily as needed for muscle spasms.   FARXIGA 10 MG TABS tablet TAKE 1 TABLET BY MOUTH EVERY DAY BEFORE BREAKFAST   finasteride (PROSCAR) 5 MG tablet Take 1 tablet (5 mg total) by mouth daily.   fluticasone (FLONASE) 50 MCG/ACT nasal spray USE TWO SPRAYS IN EACH NOSTRIL EVERY DAY (Patient taking differently: Place 2 sprays into both nostrils daily.)   gabapentin (NEURONTIN) 300 MG capsule Take 300-600 mg by mouth See admin instructions. 600 mg in the morning, and 300 mg at bedtime   hydrochlorothiazide (HYDRODIURIL) 25 MG tablet TAKE 1 TABLET BY MOUTH EVERY DAY   isosorbide mononitrate (IMDUR) 30 MG 24 hr tablet Take 30 mg by mouth daily.   meloxicam (MOBIC) 15 MG tablet Take 15 mg by mouth daily.   metoprolol tartrate (LOPRESSOR) 25 MG tablet TAKE 1 TABLET (25 MG TOTAL) BY MOUTH DAILY.   montelukast (SINGULAIR) 10 MG tablet TAKE 1 TABLET BY MOUTH EVERY DAY IN THE MORNING   Multiple Vitamin (MULTIVITAMIN WITH MINERALS) TABS tablet Take 1 tablet by mouth daily.   omeprazole (PRILOSEC) 20 MG capsule Take 20 mg by mouth daily.    tamsulosin (FLOMAX) 0.4 MG CAPS capsule 2 capsules by mouth daily   No facility-administered medications prior to visit.    Review of Systems  Constitutional:  Negative for appetite change, chills and fever.  Respiratory:  Negative for chest tightness, shortness of breath and wheezing.   Cardiovascular:  Negative for chest pain and palpitations.  Gastrointestinal:  Negative for abdominal pain, nausea and vomiting.  Psychiatric/Behavioral:         Problems remembering       Objective    BP 128/77 (BP Location: Right Arm, Patient Position: Sitting, Cuff Size: Large)   Pulse 91   Temp 99.1 F (37.3 C) (Oral)   Resp 18   Wt 287 lb  (130.2 kg)   SpO2 96% Comment: room air  BMI 46.32 kg/m  BP Readings from Last 3 Encounters:  10/11/21 128/77  09/15/21 126/75  06/30/21 (!) 165/83   Wt Readings from Last 3 Encounters:  10/11/21 287 lb (130.2 kg)  09/15/21 275 lb (124.7 kg)  06/30/21 270 lb (122.5 kg)      Physical Exam Vitals reviewed.  Constitutional:      Appearance: He is well-developed. He is obese.  HENT:     Head: Normocephalic and atraumatic.     Right Ear: External ear normal.     Left Ear: External ear normal.     Nose: Nose normal.  Eyes:  Conjunctiva/sclera: Conjunctivae normal.     Pupils: Pupils are equal, round, and reactive to light.  Cardiovascular:     Rate and Rhythm: Normal rate and regular rhythm.     Heart sounds: Normal heart sounds.  Pulmonary:     Effort: Pulmonary effort is normal.     Breath sounds: Normal breath sounds.  Musculoskeletal:     Cervical back: Normal range of motion and neck supple.  Skin:    General: Skin is warm and dry.  Neurological:     General: No focal deficit present.     Mental Status: He is alert and oriented to person, place, and time.     Deep Tendon Reflexes: Reflexes are normal and symmetric.  Psychiatric:        Mood and Affect: Mood normal.        Behavior: Behavior normal.        Thought Content: Thought content normal.        Judgment: Judgment normal.      No results found for any visits on 10/11/21.  Assessment & Plan    1. Type 2 diabetes mellitus without complication, without long-term current use of insulin (HCC) He is up a little bit with A1c going from 6.1-6.7.  Farxiga grams daily - POCT HgB A1C She is on atorvastatin for hyperlipidemia in diabetic 2. Primary hypertension Good control HCTZ prolonged  3. Need for influenza vaccination  - Flu Vaccine QUAD 36+ mos IM (Fluarix/Fluzone)  4. Mild cognitive impairment MMSE is 29/30. She is not concerned about cognitive impairment as is his wife so will refer to  neurology. - Ambulatory referral to Neurology  5. OSA (obstructive sleep apnea) CPAP  6. Gastroesophageal reflux disease without esophagitis On omeprazole  7. Prostate cancer [facility]) Prostate cancer followed by urology  8. Umbilical hernia without obstruction and without gangrene   9. Obesity, Class III, BMI 40-49.9 (morbid obesity) (HCC) And exercise discussed.   No follow-ups on file.      I, [name], MD, have reviewed all documentation for this visit. The documentation on 10/15/21 for the exam, diagnosis, procedures, and orders are all accurate and complete.    Kytzia Gienger Cranford Mon, MD  [facility] [phone] (phone) [phone] (fax)  [place]

## 2021-10-12 DIAGNOSIS — M5417 Radiculopathy, lumbosacral region: Secondary | ICD-10-CM | POA: Diagnosis not present

## 2021-10-12 DIAGNOSIS — I10 Essential (primary) hypertension: Secondary | ICD-10-CM | POA: Diagnosis not present

## 2021-10-12 DIAGNOSIS — M5416 Radiculopathy, lumbar region: Secondary | ICD-10-CM | POA: Diagnosis not present

## 2021-10-12 DIAGNOSIS — M4807 Spinal stenosis, lumbosacral region: Secondary | ICD-10-CM | POA: Diagnosis not present

## 2021-10-20 DIAGNOSIS — M5416 Radiculopathy, lumbar region: Secondary | ICD-10-CM | POA: Diagnosis not present

## 2021-10-28 DIAGNOSIS — G4733 Obstructive sleep apnea (adult) (pediatric): Secondary | ICD-10-CM | POA: Diagnosis not present

## 2021-11-04 ENCOUNTER — Other Ambulatory Visit: Payer: Self-pay | Admitting: Family Medicine

## 2021-11-04 DIAGNOSIS — I10 Essential (primary) hypertension: Secondary | ICD-10-CM

## 2021-11-22 ENCOUNTER — Other Ambulatory Visit: Payer: Self-pay

## 2021-11-22 ENCOUNTER — Encounter: Payer: Self-pay | Admitting: Family Medicine

## 2021-11-22 ENCOUNTER — Ambulatory Visit: Payer: BC Managed Care – PPO | Admitting: Family Medicine

## 2021-11-22 VITALS — BP 133/85 | HR 94 | Temp 99.0°F | Wt 280.0 lb

## 2021-11-22 DIAGNOSIS — R059 Cough, unspecified: Secondary | ICD-10-CM | POA: Diagnosis not present

## 2021-11-22 DIAGNOSIS — H6692 Otitis media, unspecified, left ear: Secondary | ICD-10-CM

## 2021-11-22 MED ORDER — HYDROCOD POLST-CPM POLST ER 10-8 MG/5ML PO SUER: 5.0000 mL | Freq: Two times a day (BID) | ORAL | 0 refills | Status: DC | PRN

## 2021-11-22 MED ORDER — AMOXICILLIN 500 MG PO CAPS
1000.0000 mg | ORAL_CAPSULE | Freq: Two times a day (BID) | ORAL | 0 refills | Status: AC
Start: 2021-11-22 — End: 2021-12-02

## 2021-11-22 NOTE — Progress Notes (Signed)
Established patient visit   Patient: Justin Soto   DOB: 22-Apr-1961   60 y.o. Male  MRN: [id number] Visit Date: 11/22/2021  Today's healthcare provider: Lelon Huh, MD   Chief Complaint  Patient presents with   URI   Subjective    URI  This is a new problem. The current episode started in the past 7 days. The problem has been gradually worsening. The maximum temperature recorded prior to his arrival was 100.4 - 100.9 F. Associated symptoms include congestion, coughing, ear pain, rhinorrhea, sinus pain, sneezing, a sore throat and wheezing. Pertinent negatives include no headaches, nausea or plugged ear sensation. He has tried NSAIDs for the symptoms. The treatment provided mild relief.    Covid Test 11/17/2021 and 11/18/2021 both were negative.   Medications: Outpatient Medications Prior to Visit  Medication Sig   amLODipine-benazepril (LOTREL) 10-40 MG capsule TAKE 1 CAPSULE BY MOUTH EVERY DAY   aspirin EC 81 MG tablet Take 81 mg by mouth daily.    atorvastatin (LIPITOR) 40 MG tablet Take 40 mg by mouth daily.   cetirizine (ZYRTEC) 10 MG tablet TAKE ONE TO TWO TABLETS BY MOUTH EVERY DAY AS NEEDED. (Patient taking differently: Take 10 mg by mouth daily.)   cyclobenzaprine (FLEXERIL) 10 MG tablet Take 1 tablet (10 mg total) by mouth 3 (three) times daily as needed for muscle spasms.   FARXIGA 10 MG TABS tablet TAKE 1 TABLET BY MOUTH EVERY DAY BEFORE BREAKFAST   finasteride (PROSCAR) 5 MG tablet Take 1 tablet (5 mg total) by mouth daily.   fluticasone (FLONASE) 50 MCG/ACT nasal spray USE TWO SPRAYS IN EACH NOSTRIL EVERY DAY (Patient taking differently: Place 2 sprays into both nostrils daily.)   gabapentin (NEURONTIN) 300 MG capsule Take 300-600 mg by mouth See admin instructions. 600 mg in the morning, and 300 mg at bedtime   hydrochlorothiazide (HYDRODIURIL) 25 MG tablet TAKE 1 TABLET BY MOUTH EVERY DAY   isosorbide mononitrate (IMDUR) 30 MG 24 hr tablet Take 30 mg by mouth  daily.   meloxicam (MOBIC) 15 MG tablet Take 15 mg by mouth daily.   metoprolol tartrate (LOPRESSOR) 25 MG tablet TAKE 1 TABLET (25 MG TOTAL) BY MOUTH DAILY.   montelukast (SINGULAIR) 10 MG tablet TAKE 1 TABLET BY MOUTH EVERY DAY IN THE MORNING   Multiple Vitamin (MULTIVITAMIN WITH MINERALS) TABS tablet Take 1 tablet by mouth daily.   omeprazole (PRILOSEC) 20 MG capsule Take 20 mg by mouth daily.    tamsulosin (FLOMAX) 0.4 MG CAPS capsule 2 capsules by mouth daily   No facility-administered medications prior to visit.    Review of Systems  Constitutional:  Positive for fatigue and fever. Negative for activity change, appetite change, chills, diaphoresis and unexpected weight change.  HENT:  Positive for congestion, ear pain, postnasal drip, rhinorrhea, sinus pressure, sinus pain, sneezing and sore throat. Negative for drooling, ear discharge, trouble swallowing and voice change.   Eyes: Negative.   Respiratory:  Positive for cough, shortness of breath and wheezing. Negative for apnea, choking, chest tightness and stridor.   Gastrointestinal: Negative.  Negative for nausea.  Musculoskeletal:  Positive for myalgias.  Neurological:  Positive for light-headedness. Negative for dizziness and headaches.      Objective    BP 133/85 (BP Location: Right Arm, Patient Position: Sitting, Cuff Size: Large)   Pulse 94   Temp 99 F (37.2 C) (Oral)   Wt 280 lb (127 kg)   SpO2 97%  BMI 45.19 kg/m    Physical Exam  General Appearance:    Obese male, alert, cooperative, in no acute distress  HENT:   right TM normal without fluid or infection, left TM red, dull, bulging, neck without nodes, frontal and maxillary sinus tender, post nasal drip noted, and nasal mucosa congested  Eyes:    PERRL, conjunctiva/corneas clear, EOM's intact       Lungs:     Clear to auscultation bilaterally, respirations unlabored  Heart:    Normal heart rate. Normal rhythm. No murmurs, rubs, or gallops.    Neurologic:    Awake, alert, oriented x 3. No apparent focal neurological           defect.         Assessment & Plan     1. Left otitis media, unspecified otitis media type - amoxicillin (AMOXIL) 500 MG capsule; Take 2 capsules (1,000 mg total) by mouth 2 (two) times daily for 10 days.  Dispense: 40 capsule; Refill: 0  2. Cough, unspecified type  - chlorpheniramine-HYDROcodone (TUSSIONEX PENNKINETIC ER) 10-8 MG/5ML SUER; Take 5 mLs by mouth every 12 (twelve) hours as needed for cough.  Dispense: 115 mL; Refill: 0   Call if symptoms change or if not rapidly improving.         The entirety of the information documented in the History of Present Illness, Review of Systems and Physical Exam were personally obtained by me. Portions of this information were initially documented by the CMA and reviewed by me for thoroughness and accuracy.     Lelon Huh, MD  [facility] [phone] (phone) [phone] (fax)  [place]

## 2021-11-27 DIAGNOSIS — G4733 Obstructive sleep apnea (adult) (pediatric): Secondary | ICD-10-CM | POA: Diagnosis not present

## 2021-11-29 NOTE — Progress Notes (Deleted)
[facility]  PATIENT: Justin Soto DOB: 10-23-1961  REFERRING CLINICIAN: Jerrol Banana.,* HISTORY FROM: *** REASON FOR VISIT: memory loss   HISTORICAL  CHIEF COMPLAINT:  No chief complaint on file.   HISTORY OF PRESENT ILLNESS:  The patient presents for evaluation of memory loss which has been present since***  TBI: *** No past history of TBI Stroke: *** no past history of stroke Seizures: *** no past history of seizures Sleep: *** no history of sleep apnea.  Has *** never had sleep study.  STOP BANG score *** Mood: *** patient denies anxiety and depression  Functional status: independent in all ** ADLs and IADLs Patient lives with *** in a *** with *** stairs. Cooking: *** Cleaning: *** Shopping: *** Bathing: *** Toileting: *** Driving: *** Bills: *** Medications: *** Ever left the stove on by accident?: *** Forget how to use items around the house?: *** Getting lost going to familiar places?: *** Forgetting loved ones names?: *** Word finding difficulty? *** Sleep: ***  OTHER MEDICAL CONDITIONS: HTN, DM2   REVIEW OF SYSTEMS: Full 14 system review of systems performed and negative with exception of: ***  ALLERGIES: Allergies  Allergen Reactions   Methocarbamol Other (See Comments)    Numbness, and change of taste    Tizanidine Nausea Only   Vicodin [Hydrocodone-Acetaminophen] Itching    Can take with benadryl     HOME MEDICATIONS: Outpatient Medications Prior to Visit  Medication Sig Dispense Refill   amLODipine-benazepril (LOTREL) 10-40 MG capsule TAKE 1 CAPSULE BY MOUTH EVERY DAY 90 capsule 1   amoxicillin (AMOXIL) 500 MG capsule Take 2 capsules (1,000 mg total) by mouth 2 (two) times daily for 10 days. 40 capsule 0   aspirin EC 81 MG tablet Take 81 mg by mouth daily.      atorvastatin (LIPITOR) 40 MG tablet Take 40 mg by mouth daily.     cetirizine (ZYRTEC) 10 MG tablet TAKE ONE TO TWO TABLETS BY MOUTH EVERY DAY AS  NEEDED. (Patient taking differently: Take 10 mg by mouth daily.) 180 tablet 4   chlorpheniramine-HYDROcodone (TUSSIONEX PENNKINETIC ER) 10-8 MG/5ML SUER Take 5 mLs by mouth every 12 (twelve) hours as needed for cough. 115 mL 0   cyclobenzaprine (FLEXERIL) 10 MG tablet Take 1 tablet (10 mg total) by mouth 3 (three) times daily as needed for muscle spasms. 50 tablet 1   FARXIGA 10 MG TABS tablet TAKE 1 TABLET BY MOUTH EVERY DAY BEFORE BREAKFAST 90 tablet 0   finasteride (PROSCAR) 5 MG tablet Take 1 tablet (5 mg total) by mouth daily. 90 tablet 3   fluticasone (FLONASE) 50 MCG/ACT nasal spray USE TWO SPRAYS IN EACH NOSTRIL EVERY DAY (Patient taking differently: Place 2 sprays into both nostrils daily.) 48 mL 4   gabapentin (NEURONTIN) 300 MG capsule Take 300-600 mg by mouth See admin instructions. 600 mg in the morning, and 300 mg at bedtime     hydrochlorothiazide (HYDRODIURIL) 25 MG tablet TAKE 1 TABLET BY MOUTH EVERY DAY 90 tablet 1   isosorbide mononitrate (IMDUR) 30 MG 24 hr tablet Take 30 mg by mouth daily.     meloxicam (MOBIC) 15 MG tablet Take 15 mg by mouth daily.     metoprolol tartrate (LOPRESSOR) 25 MG tablet TAKE 1 TABLET (25 MG TOTAL) BY MOUTH DAILY. 90 tablet 0   montelukast (SINGULAIR) 10 MG tablet TAKE 1 TABLET BY MOUTH EVERY DAY IN THE MORNING 90 tablet 0   Multiple Vitamin (MULTIVITAMIN  WITH MINERALS) TABS tablet Take 1 tablet by mouth daily.     omeprazole (PRILOSEC) 20 MG capsule Take 20 mg by mouth daily.      tamsulosin (FLOMAX) 0.4 MG CAPS capsule 2 capsules by mouth daily 180 capsule 3   No facility-administered medications prior to visit.    PAST MEDICAL HISTORY: Past Medical History:  Diagnosis Date   Anemia    Arthritis    Diabetes mellitus without complication (HCC)    diet controlled   Dyspnea    Enlarged prostate    hx of   Environmental and seasonal allergies    Frequency of urination    GERD (gastroesophageal reflux disease)    History of kidney stones     HOH (hard of hearing)    wears bilateral hearing aids   Hypertension    Irregular heart beat    Pt feels this every once in a while; PCP aware but stated it was OK   Kidney stone on right side 01/15/2017   Leg pain, right    Take Lyrica and Naproxen   MRSA (methicillin resistant staph aureus) culture positive    Neck pain of over 3 months duration    Prostate cancer ([place])    Sleep apnea    cpap-15 setting   Thalassemia    Tick fever     PAST SURGICAL HISTORY: Past Surgical History:  Procedure Laterality Date   ANTERIOR CERVICAL DECOMP/DISCECTOMY FUSION N/A 11/13/2014   Procedure: Cervical three-four, Cervical six-seven anterior cervical decompression with fusion interbody prosthesis plating and bonegraft;  Surgeon: Newman Pies, MD;  Location: [facility];  Service: Neurosurgery;  Laterality: N/A;  Cervical three-four, Cervical six-seven anterior cervical decompression with fusion interbody prosthesis plating and bonegraft   BACK SURGERY     Lumbar X 2   CALCANEAL OSTEOTOMY Right 01/11/2017   Procedure: CALCANEAL OSTEOTOMY;  Surgeon: Earnestine Leys, MD;  Location: [facility];  Service: Orthopedics;  Laterality: Right;   CIRCUMCISION N/A 12/13/2017   Procedure: PENILE BLOCK, LYSIS OF PENILE ADHESIONS;  Surgeon: Hollice Espy, MD;  Location: [facility];  Service: Urology;  Laterality: N/A;   COLONOSCOPY     COLONOSCOPY WITH PROPOFOL N/A 09/15/2021   Procedure: COLONOSCOPY WITH PROPOFOL;  Surgeon: Toledo, Benay Pike, MD;  Location: [facility];  Service: Gastroenterology;  Laterality: N/A;  DM   CYSTOSCOPY N/A 12/13/2017   Procedure: CYSTOSCOPY;  Surgeon: Hollice Espy, MD;  Location: [facility];  Service: Urology;  Laterality: N/A;   DIRECT LARYNGOSCOPY N/A 11/24/2020   Procedure: MICRO DIRECT LARYNGOSCOPY W/BIOPSY;  Surgeon: Beverly Gust, MD;  Location: [facility];  Service: ENT;  Laterality: N/A;   HERNIA REPAIR Left    groin   LEG SURGERY Right    Had surgery on right calf X 7    LITHOTRIPSY     LUMBAR WOUND DEBRIDEMENT N/A 11/28/2019   Procedure: LUMBAR WOUND DEBRIDEMENT;  Surgeon: Newman Pies, MD;  Location: [facility];  Service: Neurosurgery;  Laterality: N/A;  LUMBAR WOUND DEBRIDEMENT   QUADRICEPS TENDON REPAIR Left 06/22/2015   Procedure: REPAIR QUADRICEP TENDON;  Surgeon: Earnestine Leys, MD;  Location: [facility];  Service: Orthopedics;  Laterality: Left;   QUADRICEPS TENDON REPAIR Left 08/12/2015   Procedure: REPAIR QUADRICEP TENDON;  Surgeon: Earnestine Leys, MD;  Location: [facility];  Service: Orthopedics;  Laterality: Left;   ROTATOR CUFF REPAIR Left    TONSILLECTOMY     VASECTOMY     with hydrocele repaired    FAMILY HISTORY: Family History  Problem  Relation Age of Onset   Prostate cancer Father    Heart disease Father    Diabetes Father    Alzheimer's disease Father    Anemia Mother    Osteoporosis Mother    Healthy Sister    Diabetes Brother    Hypertension Brother    Anemia Daughter    Diabetes Brother    Kidney disease Neg Hx    Bladder Cancer Neg Hx    Colon cancer Neg Hx     SOCIAL HISTORY: Social History   Socioeconomic History   Marital status: Married    Spouse name: April   Number of children: Not on file   Years of education: Not on file   Highest education level: Not on file  Occupational History   Not on file  Tobacco Use   Smoking status: Never   Smokeless tobacco: Never  Vaping Use   Vaping Use: Never used  Substance and Sexual Activity   Alcohol use: No   Drug use: No   Sexual activity: Yes    Birth control/protection: None  Other Topics Concern   Not on file  Social History Narrative   Not on file   Social Determinants of Health   Financial Resource Strain: Not on file  Food Insecurity: Not on file  Transportation Needs: Not on file  Physical Activity: Not on file  Stress: Not on file  Social Connections: Not on file  Intimate Partner Violence: Not on file     PHYSICAL EXAM ***  GENERAL  EXAM/CONSTITUTIONAL: Vitals: There were no vitals filed for this visit. There is no height or weight on file to calculate BMI. Wt Readings from Last 3 Encounters:  11/22/21 280 lb (127 kg)  10/11/21 287 lb (130.2 kg)  09/15/21 275 lb (124.7 kg)   Patient is in no distress; well developed, nourished and groomed; neck is supple  CARDIOVASCULAR: Examination of carotid arteries is normal; no carotid bruits Regular rate and rhythm, no murmurs Examination of peripheral vascular system by observation and palpation is normal  EYES: Pupils round and reactive to light, Visual fields full to confrontation, Extraocular movements intacts,   MUSCULOSKELETAL: Gait, strength, tone, movements noted in Neurologic exam below  NEUROLOGIC: MENTAL STATUS:  MMSE - Brenda Exam 10/11/2021  Orientation to time 5  Orientation to Place 5  Registration 3  Attention/ Calculation 4  Recall 3  Language- name 2 objects 2  Language- repeat 1  Language- follow 3 step command 3  Language- read & follow direction 1  Write a sentence 1  Copy design 1  Total score 29   awake, alert, oriented to person, place and time recent and remote memory intact normal attention and concentration language fluent, comprehension intact, naming intact fund of knowledge appropriate  CRANIAL NERVE:  2nd - no papilledema or hemorrhages on fundoscopic exam 2nd, 3rd, 4th, 6th - pupils equal and reactive to light, visual fields full to confrontation, extraocular muscles intact, no nystagmus 5th - facial sensation symmetric 7th - facial strength symmetric 8th - hearing intact 9th - palate elevates symmetrically, uvula midline 11th - shoulder shrug symmetric 12th - tongue protrusion midline  MOTOR:  normal bulk and tone, no cogwheeling, full strength in the BUE, BLE  SENSORY:  normal and symmetric to light touch, pinprick, temperature, vibration  COORDINATION:  finger-nose-finger, fine finger movements  normal, no tremor  REFLEXES:  deep tendon reflexes present and symmetric  GAIT/STATION:  normal     DIAGNOSTIC DATA (LABS,  IMAGING, TESTING) - I reviewed patient records, labs, notes, testing and imaging myself where available.  Lab Results  Component Value Date   WBC 5.1 01/20/2021   HGB 12.2 (L) 01/20/2021   HCT 39.4 01/20/2021   MCV 62 (L) 01/20/2021   PLT 192 01/20/2021      Component Value Date/Time   NA 137 01/20/2021 0958   NA 138 08/11/2014 1230   K 4.3 01/20/2021 0958   K 4.1 08/11/2014 1230   CL 96 01/20/2021 0958   CL 104 08/11/2014 1230   CO2 23 01/20/2021 0958   CO2 28 08/11/2014 1230   GLUCOSE 120 (H) 01/20/2021 0958   GLUCOSE 131 (H) 11/20/2020 1212   GLUCOSE 104 (H) 08/11/2014 1230   BUN 19 01/20/2021 0958   BUN 11 08/11/2014 1230   CREATININE 1.02 01/20/2021 0958   CREATININE 1.01 08/11/2014 1230   CALCIUM 9.7 01/20/2021 0958   CALCIUM 8.5 08/11/2014 1230   PROT 7.7 01/20/2021 0958   PROT 7.7 08/11/2014 1230   ALBUMIN 4.6 01/20/2021 0958   ALBUMIN 4.0 08/11/2014 1230   AST 27 01/20/2021 0958   AST 16 08/11/2014 1230   ALT 32 01/20/2021 0958   ALT 28 08/11/2014 1230   ALKPHOS 67 01/20/2021 0958   ALKPHOS 55 08/11/2014 1230   BILITOT 0.6 01/20/2021 0958   BILITOT 0.6 08/11/2014 1230   GFRNONAA 80 01/20/2021 0958   GFRNONAA >60 11/20/2020 1212   GFRNONAA >60 08/11/2014 1230   GFRAA 92 01/20/2021 0958   GFRAA >60 08/11/2014 1230   Lab Results  Component Value Date   CHOL 117 01/20/2021   HDL 30 (L) 01/20/2021   LDLCALC 62 01/20/2021   TRIG 142 01/20/2021   Lab Results  Component Value Date   HGBA1C 6.7 (A) 10/11/2021   No results found for: [id number] Lab Results  Component Value Date   TSH 1.010 01/20/2021    ***    ASSESSMENT AND PLAN  60 y.o. year old male with ***   No diagnosis found.    PLAN: - Labs: B12 - MRI brain to assess for signs of neurodegeneration and/or significant vascular disease.  - Will place  referral for neuropsychological testing to better characterize his/her reported deficits and to establish a cognitive baseline.  - Referral placed for cognitive rehabilitation to help with some of his/her issues.  - Follow up after testing is complete.   No orders of the defined types were placed in this encounter.   No orders of the defined types were placed in this encounter.   No follow-ups on file.  I spent an average of *** chart reviewing and counseling the patient, with at least 50% of the time face to face with the patient. General brain health measures discussed, including the importance of regular aerobic exercise. Reviewed safety measures including driving safety.   Genia Harold, MD  [facility] [address], [place],  [address] [phone]

## 2021-11-30 ENCOUNTER — Ambulatory Visit: Payer: BC Managed Care – PPO | Admitting: Psychiatry

## 2021-12-09 ENCOUNTER — Telehealth: Payer: BC Managed Care – PPO | Admitting: Physician Assistant

## 2021-12-09 DIAGNOSIS — U071 COVID-19: Secondary | ICD-10-CM

## 2021-12-09 MED ORDER — MOLNUPIRAVIR EUA 200MG CAPSULE: 4.0000 | ORAL_CAPSULE | Freq: Two times a day (BID) | ORAL | 0 refills | Status: AC

## 2021-12-09 MED ORDER — BENZONATATE 100 MG PO CAPS: 100.0000 mg | ORAL_CAPSULE | Freq: Three times a day (TID) | ORAL | 0 refills | Status: DC | PRN

## 2021-12-09 MED ORDER — ALBUTEROL SULFATE HFA 108 (90 BASE) MCG/ACT IN AERS: 2.0000 | INHALATION_SPRAY | Freq: Four times a day (QID) | RESPIRATORY_TRACT | 0 refills | Status: DC | PRN

## 2021-12-09 NOTE — Progress Notes (Signed)
Virtual Visit Consent   Justin Soto, you are scheduled for a virtual visit with a [facility] provider today.     Just as with appointments in the office, your consent must be obtained to participate.  Your consent will be active for this visit and any virtual visit you may have with one of our providers in the next 365 days.     If you have a MyChart account, a copy of this consent can be sent to you electronically.  All virtual visits are billed to your insurance company just like a traditional visit in the office.    As this is a virtual visit, video technology does not allow for your provider to perform a traditional examination.  This may limit your provider's ability to fully assess your condition.  If your provider identifies any concerns that need to be evaluated in person or the need to arrange testing (such as labs, EKG, etc.), we will make arrangements to do so.     Although advances in technology are sophisticated, we cannot ensure that it will always work on either your end or our end.  If the connection with a video visit is poor, the visit may have to be switched to a telephone visit.  With either a video or telephone visit, we are not always able to ensure that we have a secure connection.     I need to obtain your verbal consent now.   Are you willing to proceed with your visit today?    Justin Soto has provided verbal consent on 12/09/2021 for a virtual visit (video or telephone).   Justin Soto, [place]   Date: 12/09/2021 9:51 AM   Virtual Visit via Video Note   I, Justin Soto, connected with  Justin Soto  ([id number], March 18, 1961) on 12/09/21 at  9:45 AM EST by a video-enabled telemedicine application and verified that I am speaking with the correct person using two identifiers.  Location: Patient: Virtual Visit Location Patient: Home Provider: Virtual Visit Location Provider: Home Office   I discussed the limitations of evaluation and management by  telemedicine and the availability of in person appointments. The patient expressed understanding and agreed to proceed.    History of Present Illness: Justin Soto is a 60 y.o. who identifies as a male who was assigned male at birth, and is being seen today for COVID-19. Notes symptoms starting Tuesday with chills, head and chest congestion with some chest tightness. Took a COVID test that day which was negative. Symptoms progressed yesterday with some nausea and worsening fatigue with aches so retested and was positive. Denies any diarrhea, melena or hematochezia. Notes chest tightness when moving around.   HPI: HPI  Problems:  Patient Active Problem List   Diagnosis Date Noted   Type 2 diabetes mellitus without complication, without long-term current use of insulin ([place]) 10/19/2020   Postoperative wound infection 11/28/2019   Lumbar stenosis with neurogenic claudication 10/23/2019   Precordial pain 06/20/2019   Trochanteric bursitis of right hip 11/01/2018   Prostate cancer ([facility]) 95/18/8416   Umbilical hernia without obstruction and without gangrene 03/06/2018   Nephrolithiasis 01/15/2017   OSA (obstructive sleep apnea) 11/08/2016   ADD (attention deficit disorder) 09/08/2015   Absolute anemia 09/08/2015   Achilles bursitis 09/08/2015   Back pain, thoracic 09/08/2015   Benign prostatic hyperplasia with lower urinary tract symptoms 09/08/2015   Clinical depression 09/08/2015   Acid reflux 09/08/2015   Bulge of cervical disc  without myelopathy 09/08/2015   History of 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 system disorder 09/08/2015   H/O 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 system disorder 09/08/2015   BP (high blood pressure) 09/08/2015   Cannot sleep 09/08/2015   Anemia, iron deficiency 09/08/2015   Bad memory 09/08/2015   Obesity, Class III, BMI 40-49.9 (morbid obesity) ([place]) 09/08/2015   Allergic rhinitis, seasonal 09/08/2015   Thalassanemia 09/08/2015   Fast heart beat 09/08/2015   Cervical spondylosis with myelopathy and  radiculopathy 11/13/2014   Hernia, inguinal, unilateral 04/06/2009    Allergies:  Allergies  Allergen Reactions   Methocarbamol Other (See Comments)    Numbness, and change of taste    Tizanidine Nausea Only   Vicodin [Hydrocodone-Acetaminophen] Itching    Can take with benadryl    Medications:  Current Outpatient Medications:    albuterol (VENTOLIN HFA) 108 (90 Base) MCG/ACT inhaler, Inhale 2 puffs into the lungs every 6 (six) hours as needed for wheezing or shortness of breath., Disp: 8 g, Rfl: 0   benzonatate (TESSALON) 100 MG capsule, Take 1 capsule (100 mg total) by mouth 3 (three) times daily as needed for cough., Disp: 30 capsule, Rfl: 0   molnupiravir EUA (LAGEVRIO) 200 mg CAPS capsule, Take 4 capsules (800 mg total) by mouth 2 (two) times daily for 5 days., Disp: 40 capsule, Rfl: 0   amLODipine-benazepril (LOTREL) 10-40 MG capsule, TAKE 1 CAPSULE BY MOUTH EVERY DAY, Disp: 90 capsule, Rfl: 1   aspirin EC 81 MG tablet, Take 81 mg by mouth daily. , Disp: , Rfl:    atorvastatin (LIPITOR) 40 MG tablet, Take 40 mg by mouth daily., Disp: , Rfl:    cetirizine (ZYRTEC) 10 MG tablet, TAKE ONE TO TWO TABLETS BY MOUTH EVERY DAY AS NEEDED. (Patient taking differently: Take 10 mg by mouth daily.), Disp: 180 tablet, Rfl: 4   chlorpheniramine-HYDROcodone (TUSSIONEX PENNKINETIC ER) 10-8 MG/5ML SUER, Take 5 mLs by mouth every 12 (twelve) hours as needed for cough., Disp: 115 mL, Rfl: 0   cyclobenzaprine (FLEXERIL) 10 MG tablet, Take 1 tablet (10 mg total) by mouth 3 (three) times daily as needed for muscle spasms., Disp: 50 tablet, Rfl: 1   FARXIGA 10 MG TABS tablet, TAKE 1 TABLET BY MOUTH EVERY DAY BEFORE BREAKFAST, Disp: 90 tablet, Rfl: 0   finasteride (PROSCAR) 5 MG tablet, Take 1 tablet (5 mg total) by mouth daily., Disp: 90 tablet, Rfl: 3   fluticasone (FLONASE) 50 MCG/ACT nasal spray, USE TWO SPRAYS IN EACH NOSTRIL EVERY DAY (Patient taking differently: Place 2 sprays into both nostrils  daily.), Disp: 48 mL, Rfl: 4   gabapentin (NEURONTIN) 300 MG capsule, Take 300-600 mg by mouth See admin instructions. 600 mg in the morning, and 300 mg at bedtime, Disp: , Rfl:    hydrochlorothiazide (HYDRODIURIL) 25 MG tablet, TAKE 1 TABLET BY MOUTH EVERY DAY, Disp: 90 tablet, Rfl: 1   isosorbide mononitrate (IMDUR) 30 MG 24 hr tablet, Take 30 mg by mouth daily., Disp: , Rfl:    meloxicam (MOBIC) 15 MG tablet, Take 15 mg by mouth daily., Disp: , Rfl:    metoprolol tartrate (LOPRESSOR) 25 MG tablet, TAKE 1 TABLET (25 MG TOTAL) BY MOUTH DAILY., Disp: 90 tablet, Rfl: 0   montelukast (SINGULAIR) 10 MG tablet, TAKE 1 TABLET BY MOUTH EVERY DAY IN THE MORNING, Disp: 90 tablet, Rfl: 0   Multiple Vitamin (MULTIVITAMIN WITH MINERALS) TABS tablet, Take 1 tablet by mouth daily., Disp: , Rfl:    omeprazole (PRILOSEC) 20 MG capsule, Take 20 mg  by mouth daily. , Disp: , Rfl:    tamsulosin (FLOMAX) 0.4 MG CAPS capsule, 2 capsules by mouth daily, Disp: 180 capsule, Rfl: 3  Observations/Objective: Patient is well-developed, well-nourished in no acute distress.  Resting comfortably at home.  Head is normocephalic, atraumatic.  No labored breathing. Speech is clear and coherent with logical content.  Patient is alert and oriented at baseline.   Assessment and Plan: 1. COVID-19 - benzonatate (TESSALON) 100 MG capsule; Take 1 capsule (100 mg total) by mouth 3 (three) times daily as needed for cough.  Dispense: 30 capsule; Refill: 0 - albuterol (VENTOLIN HFA) 108 (90 Base) MCG/ACT inhaler; Inhale 2 puffs into the lungs every 6 (six) hours as needed for wheezing or shortness of breath.  Dispense: 8 g; Refill: 0 - molnupiravir EUA (LAGEVRIO) 200 mg CAPS capsule; Take 4 capsules (800 mg total) by mouth 2 (two) times daily for 5 days.  Dispense: 40 capsule; Refill: 0  Patient with multiple risk factors for complicated course of illness. Discussed risks/benefits of antiviral medications including most common  potential ADRs. Patient voiced understanding and would like to proceed with antiviral medication. They are candidate for molnupiravir. Rx sent to pharmacy. Supportive measures, OTC medications and vitamin regimen reviewed. Tessalon and Albuterol per orders. Patient has been enrolled in a MyChart COVID symptom monitoring program. Samule Dry reviewed in detail. Strict ER precautions discussed with patient.    Follow Up Instructions: I discussed the assessment and treatment plan with the patient. The patient was provided an opportunity to ask questions and all were answered. The patient agreed with the plan and demonstrated an understanding of the instructions.  A copy of instructions were sent to the patient via MyChart unless otherwise noted below.   The patient was advised to call back or seek an in-person evaluation if the symptoms worsen or if the condition fails to improve as anticipated.  Time:  I spent 12 minutes with the patient via telehealth technology discussing the above problems/concerns.    Justin Rio, PA-C

## 2021-12-09 NOTE — Patient Instructions (Signed)
Justin Soto, thank you for joining [name], PA-C for today's virtual visit.  While this provider is not your primary care provider (PCP), if your PCP is located in our provider database this encounter information will be shared with them immediately following your visit.  Consent: (Patient) Justin Soto provided verbal consent for this virtual visit at the beginning of the encounter.  Current Medications:  Current Outpatient Medications:    albuterol (VENTOLIN HFA) 108 (90 Base) MCG/ACT inhaler, Inhale 2 puffs into the lungs every 6 (six) hours as needed for wheezing or shortness of breath., Disp: 8 g, Rfl: 0   benzonatate (TESSALON) 100 MG capsule, Take 1 capsule (100 mg total) by mouth 3 (three) times daily as needed for cough., Disp: 30 capsule, Rfl: 0   molnupiravir EUA (LAGEVRIO) 200 mg CAPS capsule, Take 4 capsules (800 mg total) by mouth 2 (two) times daily for 5 days., Disp: 40 capsule, Rfl: 0   amLODipine-benazepril (LOTREL) 10-40 MG capsule, TAKE 1 CAPSULE BY MOUTH EVERY DAY, Disp: 90 capsule, Rfl: 1   aspirin EC 81 MG tablet, Take 81 mg by mouth daily. , Disp: , Rfl:    atorvastatin (LIPITOR) 40 MG tablet, Take 40 mg by mouth daily., Disp: , Rfl:    cetirizine (ZYRTEC) 10 MG tablet, TAKE ONE TO TWO TABLETS BY MOUTH EVERY DAY AS NEEDED. (Patient taking differently: Take 10 mg by mouth daily.), Disp: 180 tablet, Rfl: 4   chlorpheniramine-HYDROcodone (TUSSIONEX PENNKINETIC ER) 10-8 MG/5ML SUER, Take 5 mLs by mouth every 12 (twelve) hours as needed for cough., Disp: 115 mL, Rfl: 0   cyclobenzaprine (FLEXERIL) 10 MG tablet, Take 1 tablet (10 mg total) by mouth 3 (three) times daily as needed for muscle spasms., Disp: 50 tablet, Rfl: 1   FARXIGA 10 MG TABS tablet, TAKE 1 TABLET BY MOUTH EVERY DAY BEFORE BREAKFAST, Disp: 90 tablet, Rfl: 0   finasteride (PROSCAR) 5 MG tablet, Take 1 tablet (5 mg total) by mouth daily., Disp: 90 tablet, Rfl: 3   fluticasone (FLONASE) 50 MCG/ACT  nasal spray, USE TWO SPRAYS IN EACH NOSTRIL EVERY DAY (Patient taking differently: Place 2 sprays into both nostrils daily.), Disp: 48 mL, Rfl: 4   gabapentin (NEURONTIN) 300 MG capsule, Take 300-600 mg by mouth See admin instructions. 600 mg in the morning, and 300 mg at bedtime, Disp: , Rfl:    hydrochlorothiazide (HYDRODIURIL) 25 MG tablet, TAKE 1 TABLET BY MOUTH EVERY DAY, Disp: 90 tablet, Rfl: 1   isosorbide mononitrate (IMDUR) 30 MG 24 hr tablet, Take 30 mg by mouth daily., Disp: , Rfl:    meloxicam (MOBIC) 15 MG tablet, Take 15 mg by mouth daily., Disp: , Rfl:    metoprolol tartrate (LOPRESSOR) 25 MG tablet, TAKE 1 TABLET (25 MG TOTAL) BY MOUTH DAILY., Disp: 90 tablet, Rfl: 0   montelukast (SINGULAIR) 10 MG tablet, TAKE 1 TABLET BY MOUTH EVERY DAY IN THE MORNING, Disp: 90 tablet, Rfl: 0   Multiple Vitamin (MULTIVITAMIN WITH MINERALS) TABS tablet, Take 1 tablet by mouth daily., Disp: , Rfl:    omeprazole (PRILOSEC) 20 MG capsule, Take 20 mg by mouth daily. , Disp: , Rfl:    tamsulosin (FLOMAX) 0.4 MG CAPS capsule, 2 capsules by mouth daily, Disp: 180 capsule, Rfl: 3   Medications ordered in this encounter:  Meds ordered this encounter  Medications   benzonatate (TESSALON) 100 MG capsule    Sig: Take 1 capsule (100 mg total) by mouth 3 (three) times  daily as needed for cough.    Dispense:  30 capsule    Refill:  0    Order Specific Question:   Supervising Provider    Answer:   MILLER, BRIAN [[id number]]   albuterol (VENTOLIN HFA) 108 (90 Base) MCG/ACT inhaler    Sig: Inhale 2 puffs into the lungs every 6 (six) hours as needed for wheezing or shortness of breath.    Dispense:  8 g    Refill:  0    Order Specific Question:   Supervising Provider    Answer:   MILLER, BRIAN [[id number]]   molnupiravir EUA (LAGEVRIO) 200 mg CAPS capsule    Sig: Take 4 capsules (800 mg total) by mouth 2 (two) times daily for 5 days.    Dispense:  40 capsule    Refill:  0    Order Specific Question:   Supervising  Provider    Answer:   Sabra Heck, [place]     *If you need refills on other medications prior to your next appointment, please contact your pharmacy*  Follow-Up: Call back or seek an in-person evaluation if the symptoms worsen or if the condition fails to improve as anticipated.  Other Instructions Please keep well-hydrated and get plenty of rest. Start a saline nasal rinse to flush out your nasal passages. You can use plain Mucinex to help thin congestion. If you have a humidifier, running in the bedroom at night. I want you to start OTC vitamin D3 1000 units daily, vitamin C 1000 mg daily, and a zinc supplement. Please take prescribed medications as directed.  You have been enrolled in a MyChart symptom monitoring program. Please answer these questions daily so we can keep track of how you are doing.  You were to quarantine for 5 days from onset of your symptoms.  After day 5, if you have had no fever and you are feeling better, you can end quarantine but need to mask for an additional 5 days. After day 5 if you have a fever or are having significant symptoms, please quarantine for full 10 days.  If you note any worsening of symptoms, any significant shortness of breath or any chest pain, please seek ER evaluation ASAP.  Please do not delay care!  COVID-19: What to Do if You Are Sick If you test positive and are an older adult or someone who is at high risk of getting very sick from COVID-19, treatment may be available. Contact a healthcare provider right away after a positive test to determine if you are eligible, even if your symptoms are mild right now. You can also visit a Test to Treat location and, if eligible, receive a prescription from a provider. Don't delay: Treatment must be started within the first few days to be effective. If you have a fever, cough, or other symptoms, you might have COVID-19. Most people have mild illness and are able to recover at home. If you are  sick: Keep track of your symptoms. If you have an emergency warning sign (including trouble breathing), call 911. Steps to help prevent the spread of COVID-19 if you are sick If you are sick with COVID-19 or think you might have COVID-19, follow the steps below to care for yourself and to help protect other people in your home and community. Stay home except to get medical care Stay home. Most people with COVID-19 have mild illness and can recover at home without medical care. Do not leave your home, except to get medical  care. Do not visit public areas and do not go to places where you are unable to wear a mask. Take care of yourself. Get rest and stay hydrated. Take over-the-counter medicines, such as acetaminophen, to help you feel better. Stay in touch with your doctor. Call before you get medical care. Be sure to get care if you have trouble breathing, or have any other emergency warning signs, or if you think it is an emergency. Avoid public transportation, ride-sharing, or taxis if possible. Get tested If you have symptoms of COVID-19, get tested. While waiting for test results, stay away from others, including staying apart from those living in your household. Get tested as soon as possible after your symptoms start. Treatments may be available for people with COVID-19 who are at risk for becoming very sick. Don't delay: Treatment must be started early to be effective--some treatments must begin within 5 days of your first symptoms. Contact your healthcare provider right away if your test result is positive to determine if you are eligible. Self-tests are one of several options for testing for the virus that causes COVID-19 and may be more convenient than laboratory-based tests and point-of-care tests. Ask your healthcare provider or your local health department if you need help interpreting your test results. You can visit your state, tribal, local, and territorial health department's website  to look for the latest local information on testing sites. Separate yourself from other people As much as possible, stay in a specific room and away from other people and pets in your home. If possible, you should use a separate bathroom. If you need to be around other people or animals in or outside of the home, wear a well-fitting mask. Tell your close contacts that they may have been exposed to COVID-19. An infected person can spread COVID-19 starting 48 hours (or 2 days) before the person has any symptoms or tests positive. By letting your close contacts know they may have been exposed to COVID-19, you are helping to protect everyone. See COVID-19 and Animals if you have questions about pets. If you are diagnosed with COVID-19, someone from the health department may call you. Answer the call to slow the spread. Monitor your symptoms Symptoms of COVID-19 include fever, cough, or other symptoms. Follow care instructions from your healthcare provider and local health department. Your local health authorities may give instructions on checking your symptoms and reporting information. When to seek emergency medical attention Look for emergency warning signs* for COVID-19. If someone is showing any of these signs, seek emergency medical care immediately: Trouble breathing Persistent pain or pressure in the chest New confusion Inability to wake or stay awake Pale, gray, or blue-colored skin, lips, or nail beds, depending on skin tone *This list is not all possible symptoms. Please call your medical provider for any other symptoms that are severe or concerning to you. Call 911 or call ahead to your local emergency facility: Notify the operator that you are seeking care for someone who has or may have COVID-19. Call ahead before visiting your doctor Call ahead. Many medical visits for routine care are being postponed or done by phone or telemedicine. If you have a medical appointment that cannot be  postponed, call your doctor's office, and tell them you have or may have COVID-19. This will help the office protect themselves and other patients. If you are sick, wear a well-fitting mask You should wear a mask if you must be around other people or animals, including pets (  even at home). Wear a mask with the best fit, protection, and comfort for you. You don't need to wear the mask if you are alone. If you can't put on a mask (because of trouble breathing, for example), cover your coughs and sneezes in some other way. Try to stay at least 6 feet away from other people. This will help protect the people around you. Masks should not be placed on young children under age 22 years, anyone who has trouble breathing, or anyone who is not able to remove the mask without help. Cover your coughs and sneezes Cover your mouth and nose with a tissue when you cough or sneeze. Throw away used tissues in a lined trash can. Immediately wash your hands with soap and water for at least 20 seconds. If soap and water are not available, clean your hands with an alcohol-based hand sanitizer that contains at least 60% alcohol. Clean your hands often Wash your hands often with soap and water for at least 20 seconds. This is especially important after blowing your nose, coughing, or sneezing; going to the bathroom; and before eating or preparing food. Use hand sanitizer if soap and water are not available. Use an alcohol-based hand sanitizer with at least 60% alcohol, covering all surfaces of your hands and rubbing them together until they feel dry. Soap and water are the best option, especially if hands are visibly dirty. Avoid touching your eyes, nose, and mouth with unwashed hands. Handwashing Tips Avoid sharing personal household items Do not share dishes, drinking glasses, cups, eating utensils, towels, or bedding with other people in your home. Wash these items thoroughly after using them with soap and water or put  in the dishwasher. Clean surfaces in your home regularly Clean and disinfect high-touch surfaces (for example, doorknobs, tables, handles, light switches, and countertops) in your "sick room" and bathroom. In shared spaces, you should clean and disinfect surfaces and items after each use by the person who is ill. If you are sick and cannot clean, a caregiver or other person should only clean and disinfect the area around you (such as your bedroom and bathroom) on an as needed basis. Your caregiver/other person should wait as long as possible (at least several hours) and wear a mask before entering, cleaning, and disinfecting shared spaces that you use. Clean and disinfect areas that may have blood, stool, or body fluids on them. Use household cleaners and disinfectants. Clean visible dirty surfaces with household cleaners containing soap or detergent. Then, use a household disinfectant. Use a product from [employer] List N: Disinfectants for Coronavirus (NWGNF-62). Be sure to follow the instructions on the label to ensure safe and effective use of the product. Many products recommend keeping the surface wet with a disinfectant for a certain period of time (look at "contact time" on the product label). You may also need to wear personal protective equipment, such as gloves, depending on the directions on the product label. Immediately after disinfecting, wash your hands with soap and water for 20 seconds. For completed guidance on cleaning and disinfecting your home, visit Complete Disinfection Guidance. Take steps to improve ventilation at home Improve ventilation (air flow) at home to help prevent from spreading COVID-19 to other people in your household. Clear out COVID-19 virus particles in the air by opening windows, using air filters, and turning on fans in your home. Use this interactive tool to learn how to improve air flow in your home. When you can be around others after  being sick with  COVID-19 Deciding when you can be around others is different for different situations. Find out when you can safely end home isolation. For any additional questions about your care, contact your healthcare provider or state or local health department. 03/02/2021 Content source: [facility] ([facility]), Division of Viral Diseases This information is not intended to replace advice given to you by your health care provider. Make sure you discuss any questions you have with your health care provider. Document Revised: 04/15/2021 Document Reviewed: 04/15/2021 Elsevier Patient Education  2022 [employer].      If you have been instructed to have an in-person evaluation today at a local Urgent Care facility, please use the link below. It will take you to a list of all of our available [facility], including address, phone number and hours of operation. Please do not delay care.  [facility]  If you or a family member do not have a primary care provider, use the link below to schedule a visit and establish care. When you choose a [facility] primary care physician or advanced practice provider, you gain a long-term partner in health. Find a Primary Care Provider  Learn more about [facility]'s in-office and virtual care options: [facility]

## 2021-12-27 DIAGNOSIS — G4733 Obstructive sleep apnea (adult) (pediatric): Secondary | ICD-10-CM | POA: Diagnosis not present

## 2021-12-28 DIAGNOSIS — G4733 Obstructive sleep apnea (adult) (pediatric): Secondary | ICD-10-CM | POA: Diagnosis not present

## 2021-12-30 ENCOUNTER — Ambulatory Visit: Payer: BC Managed Care – PPO | Admitting: Psychiatry

## 2021-12-31 ENCOUNTER — Other Ambulatory Visit: Payer: Self-pay

## 2021-12-31 ENCOUNTER — Other Ambulatory Visit: Payer: BC Managed Care – PPO

## 2021-12-31 DIAGNOSIS — C61 Malignant neoplasm of prostate: Secondary | ICD-10-CM

## 2022-01-01 LAB — PSA: Prostate Specific Ag, Serum: 1.6 ng/mL (ref 0.0–4.0)

## 2022-01-27 DIAGNOSIS — G4733 Obstructive sleep apnea (adult) (pediatric): Secondary | ICD-10-CM | POA: Diagnosis not present

## 2022-01-31 ENCOUNTER — Emergency Department
Admission: EM | Admit: 2022-01-31 | Discharge: 2022-01-31 | Disposition: A | Discharge status: Home / Self Care | Payer: BC Managed Care – PPO | Attending: Emergency Medicine | Admitting: Emergency Medicine

## 2022-01-31 ENCOUNTER — Encounter: Payer: Self-pay | Admitting: Emergency Medicine

## 2022-01-31 ENCOUNTER — Emergency Department: Payer: BC Managed Care – PPO

## 2022-01-31 ENCOUNTER — Other Ambulatory Visit: Payer: Self-pay

## 2022-01-31 DIAGNOSIS — R0602 Shortness of breath: Secondary | ICD-10-CM | POA: Insufficient documentation

## 2022-01-31 DIAGNOSIS — R4182 Altered mental status, unspecified: Secondary | ICD-10-CM | POA: Diagnosis not present

## 2022-01-31 DIAGNOSIS — I10 Essential (primary) hypertension: Secondary | ICD-10-CM | POA: Diagnosis not present

## 2022-01-31 DIAGNOSIS — Z794 Long term (current) use of insulin: Secondary | ICD-10-CM | POA: Insufficient documentation

## 2022-01-31 DIAGNOSIS — I639 Cerebral infarction, unspecified: Secondary | ICD-10-CM | POA: Diagnosis not present

## 2022-01-31 DIAGNOSIS — Z8546 Personal history of malignant neoplasm of prostate: Secondary | ICD-10-CM | POA: Insufficient documentation

## 2022-01-31 DIAGNOSIS — R519 Headache, unspecified: Secondary | ICD-10-CM | POA: Diagnosis not present

## 2022-01-31 DIAGNOSIS — R5383 Other fatigue: Secondary | ICD-10-CM | POA: Diagnosis not present

## 2022-01-31 DIAGNOSIS — R29818 Other symptoms and signs involving the nervous system: Secondary | ICD-10-CM | POA: Diagnosis not present

## 2022-01-31 DIAGNOSIS — E119 Type 2 diabetes mellitus without complications: Secondary | ICD-10-CM | POA: Insufficient documentation

## 2022-01-31 DIAGNOSIS — R410 Disorientation, unspecified: Secondary | ICD-10-CM | POA: Diagnosis not present

## 2022-01-31 DIAGNOSIS — I6523 Occlusion and stenosis of bilateral carotid arteries: Secondary | ICD-10-CM | POA: Diagnosis not present

## 2022-01-31 LAB — DIFFERENTIAL
Abs Immature Granulocytes: 0.03 10*3/uL (ref 0.00–0.07)
Basophils Absolute: 0 10*3/uL (ref 0.0–0.1)
Basophils Relative: 1 %
Eosinophils Absolute: 0.2 10*3/uL (ref 0.0–0.5)
Eosinophils Relative: 4 %
Immature Granulocytes: 1 %
Lymphocytes Relative: 25 %
Lymphs Abs: 1.3 10*3/uL (ref 0.7–4.0)
Monocytes Absolute: 0.6 10*3/uL (ref 0.1–1.0)
Monocytes Relative: 10 %
Neutro Abs: 3.2 10*3/uL (ref 1.7–7.7)
Neutrophils Relative %: 59 %
Smear Review: NORMAL

## 2022-01-31 LAB — CBC
HCT: 35.2 % — ABNORMAL LOW (ref 39.0–52.0)
Hemoglobin: 10.9 g/dL — ABNORMAL LOW (ref 13.0–17.0)
MCH: 20.3 pg — ABNORMAL LOW (ref 26.0–34.0)
MCHC: 31 g/dL (ref 30.0–36.0)
MCV: 65.4 fL — ABNORMAL LOW (ref 80.0–100.0)
Platelets: 180 10*3/uL (ref 150–400)
RBC: 5.38 MIL/uL (ref 4.22–5.81)
RDW: 18.1 % — ABNORMAL HIGH (ref 11.5–15.5)
WBC: 5.4 10*3/uL (ref 4.0–10.5)
nRBC: 0 % (ref 0.0–0.2)

## 2022-01-31 LAB — COMPREHENSIVE METABOLIC PANEL
ALT: 48 U/L — ABNORMAL HIGH (ref 0–44)
AST: 41 U/L (ref 15–41)
Albumin: 4.4 g/dL (ref 3.5–5.0)
Alkaline Phosphatase: 50 U/L (ref 38–126)
Anion gap: 10 (ref 5–15)
BUN: 19 mg/dL (ref 8–23)
CO2: 24 mmol/L (ref 22–32)
Calcium: 8.9 mg/dL (ref 8.9–10.3)
Chloride: 101 mmol/L (ref 98–111)
Creatinine, Ser: 0.84 mg/dL (ref 0.61–1.24)
GFR, Estimated: >60 mL/min (ref >60)
Glucose, Bld: 158 mg/dL — ABNORMAL HIGH (ref 70–99)
Potassium: 3.3 mmol/L — ABNORMAL LOW (ref 3.5–5.1)
Sodium: 135 mmol/L (ref 135–145)
Total Bilirubin: 0.9 mg/dL (ref 0.3–1.2)
Total Protein: 7.8 g/dL (ref 6.5–8.1)

## 2022-01-31 LAB — TROPONIN I (HIGH SENSITIVITY)
Troponin I (High Sensitivity): 5 ng/L (ref <18)
Troponin I (High Sensitivity): 5 ng/L (ref <18)

## 2022-01-31 LAB — D-DIMER, QUANTITATIVE: D-Dimer, Quant: <0.27 ug/mL-FEU (ref 0.00–0.50)

## 2022-01-31 LAB — PROTIME-INR
INR: 1 (ref 0.8–1.2)
Prothrombin Time: 12.7 seconds (ref 11.4–15.2)

## 2022-01-31 LAB — CBG MONITORING, ED
Glucose-Capillary: 157 mg/dL — ABNORMAL HIGH (ref 70–99)
Glucose-Capillary: 160 mg/dL — ABNORMAL HIGH (ref 70–99)

## 2022-01-31 LAB — APTT: aPTT: 27 seconds (ref 24–36)

## 2022-01-31 MED ORDER — METOCLOPRAMIDE HCL 10 MG PO TABS
10.0000 mg | ORAL_TABLET | Freq: Once | ORAL | Status: AC
Start: 2022-01-31 — End: 2022-01-31
  Administered 2022-01-31: 10 mg via ORAL
  Filled 2022-01-31: qty 1

## 2022-01-31 MED ORDER — ACETAMINOPHEN 500 MG PO TABS
1000.0000 mg | ORAL_TABLET | Freq: Once | ORAL | Status: AC
  Administered 2022-01-31: 1000 mg via ORAL
  Filled 2022-01-31: qty 2

## 2022-01-31 MED ORDER — SODIUM CHLORIDE 0.9% FLUSH
3.0000 mL | Freq: Once | INTRAVENOUS | Status: AC
  Administered 2022-01-31: 3 mL via INTRAVENOUS

## 2022-01-31 NOTE — ED Notes (Signed)
CODE  STROKE  CALLED  TO  Justin Soto

## 2022-01-31 NOTE — Consult Note (Signed)
Neurology Consultation Reason for Consult: Code Stroke Referring Physician: Starleen Blue, Raliegh Ip  CC: Code Stroke  History is obtained from: Patient  HPI: Justin Soto is a 61 y.o. male with a history of hypertension, diabetes, thalassemia who presents with headache and general sense of unease/? Confusion.  He states that he was feeling normal up until about 2 PM at which point he began feeling "just not right."  He has difficulty characterizing the exact nature of his complaints.  He does have headache, with severe bilateral retro-orbital pain, photophobia, throbbing in character.  He has a history of "sinus headaches" which are occasionally associated with "floaters."     LKW: 2 PM tpa given?: no, mild symptoms NIH stroke scale: Zero     ROS: A 14 point ROS was performed and is negative except as noted in the HPI.  Past Medical History:  Diagnosis Date   Anemia    Arthritis    Diabetes mellitus without complication (HCC)    diet controlled   Dyspnea    Enlarged prostate    hx of   Environmental and seasonal allergies    Frequency of urination    GERD (gastroesophageal reflux disease)    History of kidney stones    HOH (hard of hearing)    wears bilateral hearing aids   Hypertension    Irregular heart beat    Pt feels this every once in a while; PCP aware but stated it was OK   Kidney stone on right side 01/15/2017   Leg pain, right    Take Lyrica and Naproxen   MRSA (methicillin resistant staph aureus) culture positive    Neck pain of over 3 months duration    Prostate cancer ([place])    Sleep apnea    cpap-15 setting   Thalassemia    Tick fever      Family History  Problem Relation Age of Onset   Prostate cancer Father    Heart disease Father    Diabetes Father    Alzheimer's disease Father    Anemia Mother    Osteoporosis Mother    Healthy Sister    Diabetes Brother    Hypertension Brother    Anemia Daughter    Diabetes Brother    Kidney disease Neg Hx    Bladder  Cancer Neg Hx    Colon cancer Neg Hx      Social History:  reports that he has never smoked. He has never used smokeless tobacco. He reports that he does not drink alcohol and does not use drugs.   Exam: Current vital signs: BP (!) 147/86    Pulse 87    Temp 98 F (36.7 C) (Oral)    Resp 18    SpO2 95%  Vital signs in last 24 hours: Temp:  [98 F (36.7 C)] 98 F (36.7 C) (02/20 1544) Pulse Rate:  [87] 87 (02/20 1544) Resp:  [18] 18 (02/20 1544) BP: (147)/(86) 147/86 (02/20 1544) SpO2:  [95 %] 95 % (02/20 1544)   Physical Exam  Constitutional: Appears well-developed and well-nourished.  Psych: Affect appropriate to situation Eyes: No scleral injection HENT: No OP obstruction MSK: no joint deformities.  Cardiovascular: Normal rate and regular rhythm.  Respiratory: Effort normal, non-labored breathing GI: Soft.  No distension. There is no tenderness.  Skin: WDI  Neuro: Mental Status: Patient is awake, alert, oriented to person, place, month, year, and situation. Patient is able to give a clear and coherent history. No signs of  aphasia or neglect When asked to spell world he says WORULD Cranial Nerves: II: Visual Fields are full. Pupils are equal, round, and reactive to light.   III,IV, VI: EOMI without ptosis or diploplia.  V: Facial sensation is symmetric to temperature VII: Facial movement is symmetric.  VIII: hearing is intact to voice X: Uvula elevates symmetrically XI: Shoulder shrug is symmetric. XII: tongue is midline without atrophy or fasciculations.  Motor: Tone is normal. Bulk is normal. 5/5 strength was present in all four extremities.  Sensory: Sensation is symmetric to light touch and temperature in the arms and legs. Deep Tendon Reflexes: 2+ and symmetric in the biceps and patellae.  Plantars: Toes are downgoing bilaterally.  Cerebellar: FNF and HKS are intact bilaterally      I have reviewed labs in epic and the results pertinent to this  consultation are: CBG 160  I have reviewed the images obtained: CT head-negative  Impression: 61 year old male with a history of hypertension, diabetes who presents with general sense of malaise/unease in the setting of high blood pressure, headache.  From his description I suspect that this represents migraine, however given that he does not have a recognized history of migraines I would favor getting an MRI to rule out acute ischemic stroke.  He has thalassemia in his medical record, so will get an MR venogram as well to rule out CVT.   Recommendations: 1) MRI brain, MRV head 2) If above is negative, would treat as complicated migraine.    Roland Rack, MD [facility] [phone]  If 7pm- 7am, please page neurology on call as listed in [place].

## 2022-01-31 NOTE — Progress Notes (Signed)
Code Stroke activated @ 15:52.   Pt in CT at this time. [facility] page sent @ 1556.  Pt. returned from [place] @ 1557 with Dr. Leonel Ramsay in attendance. NCCT neg for bleed.  Dr. Leonel Ramsay called TSRN off d/t no TNK @ 1559.

## 2022-01-31 NOTE — ED Triage Notes (Signed)
Pt POV s/p onset of confusion, headache approx 60 minutes ago. Pt rapidly to CT. At this time pt a/o. Edp at bedside.

## 2022-01-31 NOTE — ED Provider Notes (Signed)
[facility] Provider Note    Event Date/Time   First MD Initiated Contact with Patient 01/31/22 1547     (approximate)   History   Altered Mental Status   HPI  Justin Soto is a 61 y.o. male with past medical history of hypertension and diabetes presents with acute onset of confusion.  Patient felt somewhat fatigued and tired yesterday.  Then today around 2 PM he suddenly developed headache and felt like his vision was somewhat blurry and also developed chest pain and shortness of breath.  Chest pain described as tightness.  It is not exertional does not radiate.  He has no history of similar chest pain in the past.  He denies cough fevers or chills.  Denies focal numbness weakness or difficulty speaking.    Past Medical History:  Diagnosis Date   Anemia    Arthritis    Diabetes mellitus without complication (HCC)    diet controlled   Dyspnea    Enlarged prostate    hx of   Environmental and seasonal allergies    Frequency of urination    GERD (gastroesophageal reflux disease)    History of kidney stones    HOH (hard of hearing)    wears bilateral hearing aids   Hypertension    Irregular heart beat    Pt feels this every once in a while; PCP aware but stated it was OK   Kidney stone on right side 01/15/2017   Leg pain, right    Take Lyrica and Naproxen   MRSA (methicillin resistant staph aureus) culture positive    Neck pain of over 3 months duration    Prostate cancer ([place])    Sleep apnea    cpap-15 setting   Thalassemia    Tick fever     Patient Active Problem List   Diagnosis Date Noted   Type 2 diabetes mellitus without complication, without long-term current use of insulin ([place]) 10/19/2020   Postoperative wound infection 11/28/2019   Lumbar stenosis with neurogenic claudication 10/23/2019   Precordial pain 06/20/2019   Trochanteric bursitis of right hip 11/01/2018   Prostate cancer ([place]) 22/63/3354   Umbilical hernia without  obstruction and without gangrene 03/06/2018   Nephrolithiasis 01/15/2017   OSA (obstructive sleep apnea) 11/08/2016   ADD (attention deficit disorder) 09/08/2015   Absolute anemia 09/08/2015   Achilles bursitis 09/08/2015   Back pain, thoracic 09/08/2015   Benign prostatic hyperplasia with lower urinary tract symptoms 09/08/2015   Clinical depression 09/08/2015   Acid reflux 09/08/2015   Bulge of cervical disc without myelopathy 09/08/2015   History of 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 system disorder 09/08/2015   H/O 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 system disorder 09/08/2015   BP (high blood pressure) 09/08/2015   Cannot sleep 09/08/2015   Anemia, iron deficiency 09/08/2015   Bad memory 09/08/2015   Obesity, Class III, BMI 40-49.9 (morbid obesity) ([place]) 09/08/2015   Allergic rhinitis, seasonal 09/08/2015   Thalassanemia 09/08/2015   Fast heart beat 09/08/2015   Cervical spondylosis with myelopathy and radiculopathy 11/13/2014   Hernia, inguinal, unilateral 04/06/2009     Physical Exam  Triage Vital Signs: ED Triage Vitals [01/31/22 1544]  Enc Vitals Group     BP (!) 147/86     Pulse Rate 87     Resp 18     Temp 98 F (36.7 C)     Temp Source Oral     SpO2 95 %     Weight  Height      Head Circumference      Peak Flow      Pain Score      Pain Loc      Pain Edu?      Excl. in [place]?     Most recent vital signs: Vitals:   01/31/22 1617 01/31/22 1805  BP: (!) 148/82 (!) 143/66  Pulse: 93 (!) 157  Resp: (!) 24 16  Temp: 98.3 F (36.8 C)   SpO2: 98% 96%     General: Awake, no distress.  CV:  Good peripheral perfusion.  Resp:  Normal effort.  Abd:  No distention.  Neuro:             Awake, Alert, Oriented x 3  Other:  Aox3, nml speech  PERRL, EOMI, face symmetric, nml tongue movement  5/5 strength in the BL upper and lower extremities  Sensation grossly intact in the BL upper and lower extremities  Finger-nose-finger intact BL    ED Results / Procedures / Treatments  Labs (all labs  ordered are listed, but only abnormal results are displayed) Labs Reviewed  CBC - Abnormal; Notable for the following components:      Result Value   Hemoglobin 10.9 (*)    HCT 35.2 (*)    MCV 65.4 (*)    MCH 20.3 (*)    RDW 18.1 (*)    All other components within normal limits  COMPREHENSIVE METABOLIC PANEL - Abnormal; Notable for the following components:   Potassium 3.3 (*)    Glucose, Bld 158 (*)    ALT 48 (*)    All other components within normal limits  CBG MONITORING, ED - Abnormal; Notable for the following components:   Glucose-Capillary 160 (*)    All other components within normal limits  CBG MONITORING, ED - Abnormal; Notable for the following components:   Glucose-Capillary 157 (*)    All other components within normal limits  PROTIME-INR  APTT  DIFFERENTIAL  D-DIMER, QUANTITATIVE  TROPONIN I (HIGH SENSITIVITY)  TROPONIN I (HIGH SENSITIVITY)     EKG  EKG interpretation performed by myself: NSR, nml axis, nml intervals, no acute ischemic changes    RADIOLOGY I reviewed the CT scan of the brain which does not show any acute intracranial process; agree with radiology report     PROCEDURES:  Critical Care performed:   .1-3 Lead EKG Interpretation Performed by: Rada Hay, MD Authorized by: Rada Hay, MD     Interpretation: normal     ECG rate assessment: normal     Ectopy: none     Conduction: normal    The patient is on the cardiac monitor to evaluate for evidence of arrhythmia and/or significant heart rate changes.   MEDICATIONS ORDERED IN ED: Medications  sodium chloride flush (NS) 0.9 % injection 3 mL (3 mLs Intravenous Given 01/31/22 1636)  acetaminophen (TYLENOL) tablet 1,000 mg (1,000 mg Oral Given 01/31/22 1634)  metoCLOPramide (REGLAN) tablet 10 mg (10 mg Oral Given 01/31/22 1634)     IMPRESSION / MDM / ASSESSMENT AND PLAN / ED COURSE  I reviewed the triage vital signs and the nursing notes.                               Differential diagnosis includes, but is not limited to, migraine headache, hypertensive encephalopathy, ACS  Patient is a 61 year old male presents with acute onset of headache chest  pain shortness of breath and just feeling generally unwell.  Patient's coworkers noticed that he was somewhat confused as well.  Stroke alert was called on arrival, somewhat unclear to me as patient has no focal deficits.  Patient's blood pressure relatively well controlled 140s over 80s.  CT head obtained prior to my evaluation is no acute bleed or any obvious findings.  On exam patient has no focal deficits.  He is alert and oriented does not seem confused.  Does endorse some ongoing chest tightness as well as headache.  Patient evaluated by neurology as part of stroke protocol who feels that he should have an MRI of his brain to rule out press or other signs of hypertensive encephalopathy as well to evaluate for stroke although this is less likely.  His initial EKG shows sinus rhythm without ischemic changes.  Will check troponin x2.  Will treat headache with Tylenol and Reglan.  Patient's MRI and MRV are negative.  Troponins x2 are negative.  Patient also complaining of shortness of breath.  Chest x-ray without infiltrate and D-dimer is negative.  Unclear what the cause of his dyspnea is but feel that we have ruled out the life threats.  Will discharge with primary care follow-up.    Clinical Course as of 01/31/22 Lin[place] Jan 31, 2022  1718 Troponin I (High Sensitivity): 5 [KM]  1850 D-Dimer, Quant: <0.27 [KM]  1850 Troponin I (High Sensitivity): 5 [KM]    Clinical Course User Index [KM] Rada Hay, MD     FINAL CLINICAL IMPRESSION(S) / ED DIAGNOSES   Final diagnoses:  Nonintractable headache, unspecified chronicity pattern, unspecified headache type  Shortness of breath     Rx / DC Orders   ED Discharge Orders     None        Note:  This document was prepared using Dragon voice  recognition software and may include unintentional dictation errors.   Rada Hay, MD 01/31/22 [place]

## 2022-01-31 NOTE — Discharge Instructions (Addendum)
Your MRI of your brain did not show any evidence of stroke.  Your EKG, chest x-ray cardiac enzymes and D-dimer which screens for blood clots were all reassuring.  Please follow-up with your primary care provider.  If any of your symptoms are worsening, do not hesitate to return to the emergency department.

## 2022-01-31 NOTE — Code Documentation (Signed)
Stroke Response Nurse Documentation Code Documentation  Justin Soto is a 61 y.o. male arriving to [facility] ED via [place] on 2/20 with past medical hx of HTN. On No antithrombotic. Code stroke was activated by Triage RN.   Patient from work where he was LKW at 1400 where he had a sudden onset of confusion and felt like his vision was blurred with headache.   Stroke team at the bedside on patient arrival. Labs drawn and patient cleared for CT by Dr. Starleen Blue. Patient to CT with team. NIHSS 0, see documentation for details and code stroke times. CT head completed. Patient is not a candidate for IV Thrombolytic- Not thought to be a stroke per Dr Leonel Ramsay possible Migraine/HTN.  Care/Plan: MRI to r/o stroke and reglan for headache- Per Dr Leonel Ramsay.   Bedside handoff with ED RN Bill.    Velta Addison Stroke Designer, fashion/clothing

## 2022-02-15 DIAGNOSIS — G4733 Obstructive sleep apnea (adult) (pediatric): Secondary | ICD-10-CM | POA: Diagnosis not present

## 2022-02-15 DIAGNOSIS — I10 Essential (primary) hypertension: Secondary | ICD-10-CM | POA: Diagnosis not present

## 2022-02-15 DIAGNOSIS — E782 Mixed hyperlipidemia: Secondary | ICD-10-CM | POA: Diagnosis not present

## 2022-02-16 ENCOUNTER — Other Ambulatory Visit: Payer: Self-pay | Admitting: Family Medicine

## 2022-02-16 NOTE — Telephone Encounter (Signed)
Requested Prescriptions  ?Pending Prescriptions Disp Refills  ?? montelukast (SINGULAIR) 10 MG tablet [Pharmacy Med Name: MONTELUKAST SOD 10 MG TABLET] 90 tablet 0  ?  Sig: TAKE 1 TABLET BY MOUTH EVERY DAY IN THE MORNING  ?  ? Pulmonology:  Leukotriene Inhibitors Passed - 02/16/2022  1:42 AM  ?  ?  Passed - Valid encounter within last 12 months  ?  Recent Outpatient Visits   ?      ? 2 months ago Left otitis media, unspecified otitis media type  ? [facility] Birdie Sons, MD  ? 4 months ago Type 2 diabetes mellitus without complication, without long-term current use of insulin ([place])  ? [facility] Jerrol Banana., MD  ? 1 year ago Annual physical exam  ? [facility] Fenton Malling M, [place]  ? 1 year ago Type 2 diabetes mellitus without complication, without long-term current use of insulin ([place])  ? [place], [place]  ? 1 year ago Hyperglycemia  ? [place], [place]  ?  ?  ?Future Appointments   ?        ? In 3 weeks Jerrol Banana., MD [facility] [place]  ? In 4 months Stoioff, Ronda Fairly, MD [place]  ?  ? ?  ?  ?  ?? metoprolol tartrate (LOPRESSOR) 25 MG tablet [Pharmacy Med Name: METOPROLOL TARTRATE 25 MG TAB] 90 tablet 0  ?  Sig: TAKE 1 TABLET (25 MG TOTAL) BY MOUTH DAILY.  ?  ? Cardiovascular:  Beta Blockers Failed - 02/16/2022  1:42 AM  ?  ?  Failed - Last BP in normal range  ?  BP Readings from Last 1 Encounters:  ?01/31/22 (!) 141/69  ?   ?  ?  Passed - Last Heart Rate in normal range  ?  Pulse Readings from Last 1 Encounters:  ?01/31/22 73  ?   ?  ?  Passed - Valid encounter within last 6 months  ?  Recent Outpatient Visits   ?      ? 2 months ago Left otitis media, unspecified otitis media type  ? [facility] Birdie Sons, MD  ? 4 months ago Type 2 diabetes mellitus without complication, without long-term current use of  insulin ([place])  ? [facility] Jerrol Banana., MD  ? 1 year ago Annual physical exam  ? [facility] Fenton Malling M, [place]  ? 1 year ago Type 2 diabetes mellitus without complication, without long-term current use of insulin ([place])  ? [place], [place]  ? 1 year ago Hyperglycemia  ? [place], [place]  ?  ?  ?Future Appointments   ?        ? In 3 weeks Jerrol Banana., MD [facility]  ? In 4 months Stoioff, Ronda Fairly, MD [place]  ?  ? ?  ?  ?  ? ?

## 2022-02-24 DIAGNOSIS — G4733 Obstructive sleep apnea (adult) (pediatric): Secondary | ICD-10-CM | POA: Diagnosis not present

## 2022-03-06 ENCOUNTER — Other Ambulatory Visit: Payer: Self-pay | Admitting: Family Medicine

## 2022-03-08 ENCOUNTER — Other Ambulatory Visit: Payer: Self-pay | Admitting: Family Medicine

## 2022-03-08 DIAGNOSIS — I10 Essential (primary) hypertension: Secondary | ICD-10-CM

## 2022-03-09 ENCOUNTER — Encounter: Payer: Self-pay | Admitting: Family Medicine

## 2022-03-09 ENCOUNTER — Ambulatory Visit: Payer: BC Managed Care – PPO | Admitting: Family Medicine

## 2022-03-09 ENCOUNTER — Other Ambulatory Visit: Payer: Self-pay

## 2022-03-09 VITALS — BP 133/78 | HR 73 | Temp 98.3°F | Resp 18 | Ht 66.0 in | Wt 290.0 lb

## 2022-03-09 DIAGNOSIS — Z6841 Body Mass Index (BMI) 40.0 and over, adult: Secondary | ICD-10-CM

## 2022-03-09 DIAGNOSIS — D569 Thalassemia, unspecified: Secondary | ICD-10-CM

## 2022-03-09 DIAGNOSIS — M5442 Lumbago with sciatica, left side: Secondary | ICD-10-CM | POA: Diagnosis not present

## 2022-03-09 DIAGNOSIS — F411 Generalized anxiety disorder: Secondary | ICD-10-CM | POA: Diagnosis not present

## 2022-03-09 DIAGNOSIS — C61 Malignant neoplasm of prostate: Secondary | ICD-10-CM

## 2022-03-09 DIAGNOSIS — G4733 Obstructive sleep apnea (adult) (pediatric): Secondary | ICD-10-CM

## 2022-03-09 DIAGNOSIS — I10 Essential (primary) hypertension: Secondary | ICD-10-CM

## 2022-03-09 DIAGNOSIS — M5441 Lumbago with sciatica, right side: Secondary | ICD-10-CM

## 2022-03-09 DIAGNOSIS — E119 Type 2 diabetes mellitus without complications: Secondary | ICD-10-CM

## 2022-03-09 DIAGNOSIS — G8929 Other chronic pain: Secondary | ICD-10-CM

## 2022-03-09 MED ORDER — CYCLOBENZAPRINE HCL 10 MG PO TABS: 10.0000 mg | ORAL_TABLET | Freq: Every day | ORAL | 1 refills | Status: DC

## 2022-03-09 MED ORDER — SERTRALINE HCL 50 MG PO TABS: 50.0000 mg | ORAL_TABLET | Freq: Every day | ORAL | 3 refills | Status: DC

## 2022-03-09 NOTE — Progress Notes (Signed)
?  ? ? ?Established patient visit ? ?I,Justin Soto,acting as a scribe for Justin Durie, MD.,have documented all relevant documentation on the behalf of Justin Durie, MD,as directed by  Justin Durie, MD while in the presence of Justin Durie, MD. ? ? ?Patient: Justin Soto   DOB: November 19, 1961   61 y.o. Male  MRN: [id number] ?Visit Date: 03/09/2022 ? ?Today's healthcare provider: [name], MD  ? ?Chief Complaint  ?Patient presents with  ? Follow-up  ? Diabetes  ? Hypertension  ? Hyperlipidemia  ? ?Subjective  ?  ?HPI  ?Patient comes in today for follow-up.  He has chronic anxiety which is aggravated by chronic back pain. ?He has been followed by specialist for his chronic back pain. ?He has had trouble getting weight off. ?Diabetes Mellitus Type II, follow-up ? ?Lab Results  ?Component Value Date  ? HGBA1C 6.7 (A) 10/11/2021  ? HGBA1C 6.1 (H) 01/20/2021  ? HGBA1C 6.3 (A) 10/19/2020  ? ?Last seen for diabetes 5 months ago.  ?Management since then includes; He is up a little bit with A1c going from 6.1-6.7.  Farxiga grams daily.continuing the same treatment. ?He reports good compliance with treatment. ?He is not having side effects. none ? ?Home blood sugar records: fasting range: 160 ? ?Episodes of hypoglycemia? No none ?  ?Current insulin regiment: n/a ?Most Recent Eye Exam: past due ? ?--------------------------------------------------------------------------------------------------- ?Hypertension, follow-up ? ?BP Readings from Last 3 Encounters:  ?03/09/22 133/78  ?01/31/22 (!) 141/69  ?11/22/21 133/85  ? Wt Readings from Last 3 Encounters:  ?03/09/22 290 lb (131.5 kg)  ?01/31/22 292 lb 12.3 oz (132.8 kg)  ?11/22/21 280 lb (127 kg)  ?  ? ?He was last seen for hypertension 5 months ago.  ?BP at that visit was 128/77. ?Management since that visit includes; Good control HCTZ prolonged. ?He reports good compliance with treatment. ?He is not having side effects. none ?He is not  exercising. ?He is not adherent to low salt diet.   ?Outside blood pressures are not checking . ? ?He does not smoke. ? ?Use of agents associated with hypertension: none.  ? ?--------------------------------------------------------------------------------------------------- ?Lipid/Cholesterol, follow-up ? ?Last Lipid Panel: ?Lab Results  ?Component Value Date  ? CHOL 117 01/20/2021  ? Garza 62 01/20/2021  ? HDL 30 (L) 01/20/2021  ? TRIG 142 01/20/2021  ? ? ?He was last seen for this 1 years ago.  ?Management since that visit includes; on atorvastatin. ? ?He reports good compliance with treatment. ?He is not having side effects. none ? ?He is following a Regular diet. ?Current exercise: none ? ?Last metabolic panel ?Lab Results  ?Component Value Date  ? GLUCOSE 158 (H) 01/31/2022  ? NA 135 01/31/2022  ? K 3.3 (L) 01/31/2022  ? BUN 19 01/31/2022  ? CREATININE 0.84 01/31/2022  ? GFRNONAA >60 01/31/2022  ? CALCIUM 8.9 01/31/2022  ? AST 41 01/31/2022  ? ALT 48 (H) 01/31/2022  ? ?The ASCVD Risk score (Arnett DK, et al., 2019) failed to calculate for the following reasons: ?  The valid total cholesterol range is 130 to 320 mg/dL ? ?--------------------------------------------------------------------------------------------------- ? ? ?Medications: ?Outpatient Medications Prior to Visit  ?Medication Sig  ? albuterol (VENTOLIN HFA) 108 (90 Base) MCG/ACT inhaler Inhale 2 puffs into the lungs every 6 (six) hours as needed for wheezing or shortness of breath.  ? amLODipine-benazepril (LOTREL) 10-40 MG capsule TAKE 1 CAPSULE BY MOUTH EVERY DAY  ? aspirin EC 81 MG tablet Take 81 mg by  mouth daily.   ? atorvastatin (LIPITOR) 40 MG tablet Take 40 mg by mouth daily.  ? carisoprodol (SOMA) 350 MG tablet Take 350 mg by mouth 4 (four) times daily.  ? cetirizine (ZYRTEC) 10 MG tablet TAKE ONE TO TWO TABLETS BY MOUTH EVERY DAY AS NEEDED. (Patient taking differently: Take 10 mg by mouth daily.)  ? chlorpheniramine-HYDROcodone (TUSSIONEX  PENNKINETIC ER) 10-8 MG/5ML SUER Take 5 mLs by mouth every 12 (twelve) hours as needed for cough.  ? FARXIGA 10 MG TABS tablet TAKE 1 TABLET BY MOUTH EVERY DAY BEFORE BREAKFAST  ? finasteride (PROSCAR) 5 MG tablet Take 1 tablet (5 mg total) by mouth daily.  ? fluticasone (FLONASE) 50 MCG/ACT nasal spray USE TWO SPRAYS IN EACH NOSTRIL EVERY DAY (Patient taking differently: Place 2 sprays into both nostrils daily.)  ? gabapentin (NEURONTIN) 300 MG capsule Take 300-600 mg by mouth See admin instructions. 600 mg in the morning, and 300 mg at bedtime  ? hydrochlorothiazide (HYDRODIURIL) 25 MG tablet TAKE 1 TABLET BY MOUTH EVERY DAY  ? isosorbide mononitrate (IMDUR) 30 MG 24 hr tablet Take 30 mg by mouth daily.  ? meloxicam (MOBIC) 15 MG tablet Take 15 mg by mouth daily.  ? metoprolol tartrate (LOPRESSOR) 25 MG tablet TAKE 1 TABLET (25 MG TOTAL) BY MOUTH DAILY.  ? montelukast (SINGULAIR) 10 MG tablet TAKE 1 TABLET BY MOUTH EVERY DAY IN THE MORNING  ? Multiple Vitamin (MULTIVITAMIN WITH MINERALS) TABS tablet Take 1 tablet by mouth daily.  ? omeprazole (PRILOSEC) 20 MG capsule Take 20 mg by mouth daily.   ? oxyCODONE-acetaminophen (PERCOCET/ROXICET) 5-325 MG tablet Take 1 tablet by mouth every 6 (six) hours as needed.  ? tamsulosin (FLOMAX) 0.4 MG CAPS capsule 2 capsules by mouth daily  ? [DISCONTINUED] cyclobenzaprine (FLEXERIL) 10 MG tablet Take 1 tablet (10 mg total) by mouth 3 (three) times daily as needed for muscle spasms.  ? [DISCONTINUED] benzonatate (TESSALON) 100 MG capsule Take 1 capsule (100 mg total) by mouth 3 (three) times daily as needed for cough. (Patient not taking: Reported on 03/09/2022)  ? ?No facility-administered medications prior to visit.  ? ? ?Review of Systems ? ?Last hemoglobin A1c ?Lab Results  ?Component Value Date  ? HGBA1C 6.7 (A) 10/11/2021  ? ?  ?  Objective  ?  ?BP 133/78 (BP Location: Right Arm, Patient Position: Sitting, Cuff Size: Large)   Pulse 73   Temp 98.3 ?F (36.8 ?C) (Temporal)    Resp 18   Ht '5\' 6"'$  (1.676 m)   Wt 290 lb (131.5 kg)   SpO2 93%   BMI 46.81 kg/m?  ?BP Readings from Last 3 Encounters:  ?03/09/22 133/78  ?01/31/22 (!) 141/69  ?11/22/21 133/85  ? ?Wt Readings from Last 3 Encounters:  ?03/09/22 290 lb (131.5 kg)  ?01/31/22 292 lb 12.3 oz (132.8 kg)  ?11/22/21 280 lb (127 kg)  ? ?  ? ?Physical Exam ?Vitals reviewed.  ?Constitutional:   ?   Appearance: He is well-developed. He is obese.  ?HENT:  ?   Head: Normocephalic and atraumatic.  ?   Right Ear: External ear normal.  ?   Left Ear: External ear normal.  ?   Nose: Nose normal.  ?Eyes:  ?   Conjunctiva/sclera: Conjunctivae normal.  ?   Pupils: Pupils are equal, round, and reactive to light.  ?Cardiovascular:  ?   Rate and Rhythm: Normal rate and regular rhythm.  ?   Heart sounds: Normal heart sounds.  ?Pulmonary:  ?  Effort: Pulmonary effort is normal.  ?   Breath sounds: Normal breath sounds.  ?Musculoskeletal:  ?   Cervical back: Normal range of motion and neck supple.  ?Skin: ?   General: Skin is warm and dry.  ?Neurological:  ?   Mental Status: He is alert and oriented to person, place, and time. Mental status is at baseline.  ?   Deep Tendon Reflexes: Reflexes are normal and symmetric.  ?Psychiatric:     ?   Mood and Affect: Mood normal.     ?   Behavior: Behavior normal.     ?   Thought Content: Thought content normal.     ?   Judgment: Judgment normal.  ?  ? ? ?No results found for any visits on 03/09/22. ? Assessment & Plan  ?  ? ?1. GAD (generalized anxiety disorder) ?Start sertraline 50 mg daily.  Follow-up in a couple months. ?- sertraline (ZOLOFT) 50 MG tablet; Take 1 tablet (50 mg total) by mouth daily.  Dispense: 30 tablet; Refill: 3 ? ?2. Chronic bilateral low back pain with bilateral sciatica ?Try Flexeril only at bedtime.  Followed by specialist ?- cyclobenzaprine (FLEXERIL) 10 MG tablet; Take 1 tablet (10 mg total) by mouth at bedtime.  Dispense: 50 tablet; Refill: 1 ? ?3. Type 2 diabetes mellitus without  complication, without long-term current use of insulin ([place]) ?Goal A1c less than 6.5-7.  On Farxiga ? ?4. Prostate cancer ([place]) ? ? ?5. Class 3 severe obesity due to excess calories with serious comorbidity and body ma

## 2022-03-15 DIAGNOSIS — E782 Mixed hyperlipidemia: Secondary | ICD-10-CM | POA: Diagnosis not present

## 2022-03-15 DIAGNOSIS — G4733 Obstructive sleep apnea (adult) (pediatric): Secondary | ICD-10-CM

## 2022-03-20 DIAGNOSIS — G4733 Obstructive sleep apnea (adult) (pediatric): Secondary | ICD-10-CM | POA: Diagnosis not present

## 2022-03-21 ENCOUNTER — Ambulatory Visit
Admission: RE | Admit: 2022-03-21 | Discharge: 2022-03-21 | Disposition: A | Discharge status: Home / Self Care | Payer: BC Managed Care – PPO | Source: Ambulatory Visit | Attending: Urology | Admitting: Urology

## 2022-03-21 ENCOUNTER — Encounter: Payer: Self-pay | Admitting: Urology

## 2022-03-21 ENCOUNTER — Ambulatory Visit
Admission: RE | Admit: 2022-03-21 | Discharge: 2022-03-21 | Disposition: A | Discharge status: Home / Self Care | Payer: BC Managed Care – PPO | Attending: Urology | Admitting: Urology

## 2022-03-21 ENCOUNTER — Other Ambulatory Visit: Payer: Self-pay | Admitting: *Deleted

## 2022-03-21 ENCOUNTER — Ambulatory Visit: Payer: BC Managed Care – PPO | Admitting: Urology

## 2022-03-21 ENCOUNTER — Other Ambulatory Visit
Admission: RE | Admit: 2022-03-21 | Discharge: 2022-03-21 | Disposition: A | Discharge status: Home / Self Care | Payer: BC Managed Care – PPO | Source: Home / Self Care | Attending: Urology | Admitting: Urology

## 2022-03-21 VITALS — BP 162/80 | HR 105 | Ht 67.0 in | Wt 284.0 lb

## 2022-03-21 DIAGNOSIS — N200 Calculus of kidney: Secondary | ICD-10-CM | POA: Diagnosis not present

## 2022-03-21 DIAGNOSIS — N201 Calculus of ureter: Secondary | ICD-10-CM

## 2022-03-21 DIAGNOSIS — R109 Unspecified abdominal pain: Secondary | ICD-10-CM | POA: Diagnosis not present

## 2022-03-21 DIAGNOSIS — N401 Enlarged prostate with lower urinary tract symptoms: Secondary | ICD-10-CM

## 2022-03-21 DIAGNOSIS — K5730 Diverticulosis of large intestine without perforation or abscess without bleeding: Secondary | ICD-10-CM | POA: Diagnosis not present

## 2022-03-21 DIAGNOSIS — I700 Atherosclerosis of aorta: Secondary | ICD-10-CM | POA: Diagnosis not present

## 2022-03-21 DIAGNOSIS — K4090 Unilateral inguinal hernia, without obstruction or gangrene, not specified as recurrent: Secondary | ICD-10-CM | POA: Diagnosis not present

## 2022-03-21 LAB — URINALYSIS, COMPLETE (UACMP) WITH MICROSCOPIC
Bilirubin Urine: NEGATIVE
Glucose, UA: 500 mg/dL — AB
Hgb urine dipstick: NEGATIVE
Ketones, ur: NEGATIVE mg/dL
Leukocytes,Ua: NEGATIVE
Nitrite: NEGATIVE
Protein, ur: NEGATIVE mg/dL
RBC / HPF: NONE SEEN RBC/hpf (ref 0–5)
Specific Gravity, Urine: 1.01 (ref 1.005–1.030)
pH: 5.5 (ref 5.0–8.0)

## 2022-03-21 LAB — BLADDER SCAN AMB NON-IMAGING

## 2022-03-21 MED ORDER — OXYCODONE-ACETAMINOPHEN 10-325 MG PO TABS: 1.0000 | ORAL_TABLET | ORAL | 0 refills | Status: DC | PRN

## 2022-03-21 NOTE — Progress Notes (Signed)
03/24/22 ?12:17 PM  ? ?Justin Soto ?06-24-1961 ?[id number] ? ?Referring provider:  ?Jerrol Banana., MD ?[address] ?[address] ?[place],  [place] [address] ? ?Chief Complaint  ?Patient presents with  ? Follow-up  ?  Difficulty with urination   ? ?Urological history: ?T1c low risk prostate cancer  ?- PSA 1.6 in 12/2021 ?- Biopsy 07/2018; abnormal PSA velocity (2.0 uncorrected); 48 g  ?- 4/12 cores; Gleason 3+3 adenocarcinoma ?- Elected active surveillance ?- MRI 08/2020; 31 g prostate; no high-grade lesions noted ?- Confirmatory biopsy 12/09/2020; 31 g volume -1/12 cores; Gleason 3+3 adenocarcinoma 5% ?- has follow up in July 2023 ? ?2.  BPH with LUTS ?- Combination therapy tamsulosin/finasteride ? ?3.  History stone disease ?- Prior SWL ?- CT 06/2017 with 3 mm right upper pole calculus ?- CT 03/2022 - possible 2-3 mm left proximal ureteral stone  ? ?HPI: ?Justin Soto is a 61 y.o.male who presents today for further evaluation of difficulty urinating and painful urination.  ? ?KUB today was personally reviewed and interpreted is visualized potential right mid renal stone 5 mm in size no ureteral or bladder stones.  ? ?He reports left flank pain that has been ongoing for a week he has urinary frequency. His pain radiates from left flank to left hip. He does not feel like he is emptying his bladder.  ? ?Renal stone study 03/2022 - ? 2-3 mm left proximal ureteral stone ? ?Patient denies any modifying or aggravating factors.  Patient denies any gross hematuria, dysuria.  Patient denies any fevers, chills, nausea or vomiting.  ? ?UA with 500 glucose and rare bacteria ? ?PMH: ?Past Medical History:  ?Diagnosis Date  ? Anemia   ? Arthritis   ? Diabetes mellitus without complication ([place])   ? diet controlled  ? Dyspnea   ? Enlarged prostate   ? hx of  ? Environmental and seasonal allergies   ? Frequency of urination   ? GERD (gastroesophageal reflux disease)   ? History of kidney stones   ? HOH (hard of hearing)   ?  wears bilateral hearing aids  ? Hypertension   ? Irregular heart beat   ? Pt feels this every once in a while; PCP aware but stated it was OK  ? Kidney stone on right side 01/15/2017  ? Leg pain, right   ? Take Lyrica and Naproxen  ? MRSA (methicillin resistant staph aureus) culture positive   ? Neck pain of over 3 months duration   ? Prostate cancer ([place])   ? Sleep apnea   ? cpap-15 setting  ? Thalassemia   ? Tick fever   ? ? ?Surgical History: ?Past Surgical History:  ?Procedure Laterality Date  ? ANTERIOR CERVICAL DECOMP/DISCECTOMY FUSION N/A 11/13/2014  ? Procedure: Cervical three-four, Cervical six-seven anterior cervical decompression with fusion interbody prosthesis plating and bonegraft;  Surgeon: Newman Pies, MD;  Location: [facility];  Service: Neurosurgery;  Laterality: N/A;  Cervical three-four, Cervical six-seven anterior cervical decompression with fusion interbody prosthesis plating and bonegraft  ? BACK SURGERY    ? Lumbar X 2  ? CALCANEAL OSTEOTOMY Right 01/11/2017  ? Procedure: CALCANEAL OSTEOTOMY;  Surgeon: Earnestine Leys, MD;  Location: [facility];  Service: Orthopedics;  Laterality: Right;  ? CIRCUMCISION N/A 12/13/2017  ? Procedure: PENILE BLOCK, LYSIS OF PENILE ADHESIONS;  Surgeon: Hollice Espy, MD;  Location: [facility];  Service: Urology;  Laterality: N/A;  ? COLONOSCOPY    ? COLONOSCOPY WITH PROPOFOL N/A 09/15/2021  ?  Procedure: COLONOSCOPY WITH PROPOFOL;  Surgeon: Toledo, Benay Pike, MD;  Location: [facility];  Service: Gastroenterology;  Laterality: N/A;  DM  ? CYSTOSCOPY N/A 12/13/2017  ? Procedure: CYSTOSCOPY;  Surgeon: Hollice Espy, MD;  Location: [facility];  Service: Urology;  Laterality: N/A;  ? DIRECT LARYNGOSCOPY N/A 11/24/2020  ? Procedure: MICRO DIRECT LARYNGOSCOPY W/BIOPSY;  Surgeon: Beverly Gust, MD;  Location: [facility];  Service: ENT;  Laterality: N/A;  ? HERNIA REPAIR Left   ? groin  ? LEG SURGERY Right   ? Had surgery on right calf X 7  ? LITHOTRIPSY    ? LUMBAR WOUND  DEBRIDEMENT N/A 11/28/2019  ? Procedure: LUMBAR WOUND DEBRIDEMENT;  Surgeon: Newman Pies, MD;  Location: [facility];  Service: Neurosurgery;  Laterality: N/A;  LUMBAR WOUND DEBRIDEMENT  ? QUADRICEPS TENDON REPAIR Left 06/22/2015  ? Procedure: REPAIR QUADRICEP TENDON;  Surgeon: Earnestine Leys, MD;  Location: [facility];  Service: Orthopedics;  Laterality: Left;  ? QUADRICEPS TENDON REPAIR Left 08/12/2015  ? Procedure: REPAIR QUADRICEP TENDON;  Surgeon: Earnestine Leys, MD;  Location: [facility];  Service: Orthopedics;  Laterality: Left;  ? ROTATOR CUFF REPAIR Left   ? TONSILLECTOMY    ? VASECTOMY    ? with hydrocele repaired  ? ? ?Home Medications:  ?Allergies as of 03/21/2022   ? ?   Reactions  ? Methocarbamol Other (See Comments)  ? Numbness, and change of taste   ? Tizanidine Nausea Only  ? Vicodin [hydrocodone-acetaminophen] Itching  ? Can take with benadryl   ? ?  ? ?  ?Medication List  ?  ? ?  ? Accurate as of March 21, 2022 11:59 PM. If you have any questions, ask your nurse or doctor.  ?  ?  ? ?  ? ?STOP taking these medications   ? ?oxyCODONE-acetaminophen 5-325 MG tablet ?Commonly known as: PERCOCET/ROXICET ?Replaced by: oxyCODONE-acetaminophen 10-325 MG tablet ?Stopped by: Zara Council, PA-C ?  ? ?  ? ?TAKE these medications   ? ?albuterol 108 (90 Base) MCG/ACT inhaler ?Commonly known as: VENTOLIN HFA ?Inhale 2 puffs into the lungs every 6 (six) hours as needed for wheezing or shortness of breath. ?  ?amLODipine-benazepril 10-40 MG capsule ?Commonly known as: LOTREL ?TAKE 1 CAPSULE BY MOUTH EVERY DAY ?  ?aspirin EC 81 MG tablet ?Take 81 mg by mouth daily. ?  ?atorvastatin 40 MG tablet ?Commonly known as: LIPITOR ?Take 40 mg by mouth daily. ?  ?carisoprodol 350 MG tablet ?Commonly known as: SOMA ?Take 350 mg by mouth 4 (four) times daily. ?  ?cetirizine 10 MG tablet ?Commonly known as: ZYRTEC ?TAKE ONE TO TWO TABLETS BY MOUTH EVERY DAY AS NEEDED. ?  ?chlorpheniramine-HYDROcodone 10-8 MG/5ML Suer ?Commonly known  as: Tussionex Pennkinetic ER ?Take 5 mLs by mouth every 12 (twelve) hours as needed for cough. ?  ?cyclobenzaprine 10 MG tablet ?Commonly known as: FLEXERIL ?Take 1 tablet (10 mg total) by mouth at bedtime. ?  ?Farxiga 10 MG Tabs tablet ?Generic drug: dapagliflozin propanediol ?TAKE 1 TABLET BY MOUTH EVERY DAY BEFORE BREAKFAST ?  ?finasteride 5 MG tablet ?Commonly known as: PROSCAR ?Take 1 tablet (5 mg total) by mouth daily. ?  ?fluticasone 50 MCG/ACT nasal spray ?Commonly known as: FLONASE ?USE TWO SPRAYS IN EACH NOSTRIL EVERY DAY ?  ?gabapentin 300 MG capsule ?Commonly known as: NEURONTIN ?Take 300-600 mg by mouth See admin instructions. 600 mg in the morning, and 300 mg at bedtime ?  ?hydrochlorothiazide 25 MG tablet ?Commonly known as: HYDRODIURIL ?  TAKE 1 TABLET BY MOUTH EVERY DAY ?  ?isosorbide mononitrate 30 MG 24 hr tablet ?Commonly known as: IMDUR ?Take 30 mg by mouth daily. ?  ?meloxicam 15 MG tablet ?Commonly known as: MOBIC ?Take 15 mg by mouth daily. ?  ?metoprolol tartrate 25 MG tablet ?Commonly known as: LOPRESSOR ?TAKE 1 TABLET (25 MG TOTAL) BY MOUTH DAILY. ?  ?montelukast 10 MG tablet ?Commonly known as: SINGULAIR ?TAKE 1 TABLET BY MOUTH EVERY DAY IN THE MORNING ?  ?multivitamin with minerals Tabs tablet ?Take 1 tablet by mouth daily. ?  ?omeprazole 20 MG capsule ?Commonly known as: PRILOSEC ?Take 20 mg by mouth daily. ?  ?oxyCODONE-acetaminophen 10-325 MG tablet ?Commonly known as: Percocet ?Take 1 tablet by mouth every 4 (four) hours as needed for pain. ?Replaces: oxyCODONE-acetaminophen 5-325 MG tablet ?Started by: Zara Council, PA-C ?  ?sertraline 50 MG tablet ?Commonly known as: ZOLOFT ?Take 1 tablet (50 mg total) by mouth daily. ?  ?tamsulosin 0.4 MG Caps capsule ?Commonly known as: FLOMAX ?2 capsules by mouth daily ?  ? ?  ? ? ?Allergies:  ?Allergies  ?Allergen Reactions  ? Methocarbamol Other (See Comments)  ?  Numbness, and change of taste   ? Tizanidine Nausea Only  ? Vicodin  [Hydrocodone-Acetaminophen] Itching  ?  Can take with benadryl   ? ? ?Family History: ?Family History  ?Problem Relation Age of Onset  ? Prostate cancer Father   ? Heart disease Father   ? Diabetes Father   ? Alzheimer's dise

## 2022-03-23 LAB — URINE CULTURE: Culture: NO GROWTH

## 2022-03-28 DIAGNOSIS — E782 Mixed hyperlipidemia: Secondary | ICD-10-CM | POA: Diagnosis not present

## 2022-03-28 DIAGNOSIS — I10 Essential (primary) hypertension: Secondary | ICD-10-CM | POA: Diagnosis not present

## 2022-03-28 DIAGNOSIS — G4733 Obstructive sleep apnea (adult) (pediatric): Secondary | ICD-10-CM | POA: Diagnosis not present

## 2022-03-30 ENCOUNTER — Ambulatory Visit: Payer: BC Managed Care – PPO | Admitting: Urology

## 2022-03-30 ENCOUNTER — Other Ambulatory Visit: Payer: Self-pay | Admitting: Family Medicine

## 2022-03-30 DIAGNOSIS — I10 Essential (primary) hypertension: Secondary | ICD-10-CM

## 2022-03-31 ENCOUNTER — Other Ambulatory Visit: Payer: Self-pay | Admitting: Family Medicine

## 2022-03-31 DIAGNOSIS — F411 Generalized anxiety disorder: Secondary | ICD-10-CM

## 2022-04-04 ENCOUNTER — Ambulatory Visit (INDEPENDENT_AMBULATORY_CARE_PROVIDER_SITE_OTHER): Payer: BC Managed Care – PPO | Admitting: Family Medicine

## 2022-04-04 VITALS — BP 124/76 | Temp 98.5°F | Wt 284.0 lb

## 2022-04-04 DIAGNOSIS — G4733 Obstructive sleep apnea (adult) (pediatric): Secondary | ICD-10-CM

## 2022-04-04 DIAGNOSIS — E119 Type 2 diabetes mellitus without complications: Secondary | ICD-10-CM | POA: Diagnosis not present

## 2022-04-04 DIAGNOSIS — D569 Thalassemia, unspecified: Secondary | ICD-10-CM

## 2022-04-04 DIAGNOSIS — Z0000 Encounter for general adult medical examination without abnormal findings: Secondary | ICD-10-CM | POA: Diagnosis not present

## 2022-04-04 DIAGNOSIS — Z125 Encounter for screening for malignant neoplasm of prostate: Secondary | ICD-10-CM | POA: Diagnosis not present

## 2022-04-04 NOTE — Progress Notes (Signed)
? ? ? ?Complete physical exam ? ? ?Patient: Justin Soto   DOB: [birth year]   61 y.o. Male  MRN: [id number] ?Visit Date: 04/04/2022 ? ?Today's healthcare provider: [name], MD  ? ?No chief complaint on file. ? ?Subjective  ?  ?Justin Soto is a 61 y.o. male who presents today for a complete physical exam.  ? 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  He is status post 4 back surgeries. ?Needs a new prescription for an auto CPAP machine between 5 and 20 mm water ? ?Past Medical History:  ?Diagnosis Date  ? Anemia   ? Arthritis   ? Diabetes mellitus without complication ([place])   ? diet controlled  ? Dyspnea   ? Enlarged prostate   ? hx of  ? Environmental and seasonal allergies   ? Frequency of urination   ? GERD (gastroesophageal reflux disease)   ? History of kidney stones   ? HOH (hard of hearing)   ? wears bilateral hearing aids  ? Hypertension   ? Irregular heart beat   ? Pt feels this every once in a while; PCP aware but stated it was OK  ? Kidney stone on right side 01/15/2017  ? Leg pain, right   ? Take Lyrica and Naproxen  ? MRSA (methicillin resistant staph aureus) culture positive   ? Neck pain of over 3 months duration   ? Prostate cancer ([place])   ? Sleep apnea   ? cpap-15 setting  ? Thalassemia   ? Tick fever   ? ?Past Surgical History:  ?Procedure Laterality Date  ? ANTERIOR CERVICAL DECOMP/DISCECTOMY FUSION N/A 11/13/2014  ? Procedure: Cervical three-four, Cervical six-seven anterior cervical decompression with fusion interbody prosthesis plating and bonegraft;  Surgeon: Newman Pies, MD;  Location: [facility];  Service: Neurosurgery;  Laterality: N/A;  Cervical three-four, Cervical six-seven anterior cervical decompression with fusion interbody prosthesis plating and bonegraft  ? BACK SURGERY    ? Lumbar X 2  ? CALCANEAL OSTEOTOMY Right 01/11/2017  ? Procedure: CALCANEAL OSTEOTOMY;  Surgeon: Earnestine Leys, MD;  Location: [facility];  Service: Orthopedics;  Laterality: Right;  ?  CIRCUMCISION N/A 12/13/2017  ? Procedure: PENILE BLOCK, LYSIS OF PENILE ADHESIONS;  Surgeon: Hollice Espy, MD;  Location: [facility];  Service: Urology;  Laterality: N/A;  ? COLONOSCOPY    ? COLONOSCOPY WITH PROPOFOL N/A 09/15/2021  ? Procedure: COLONOSCOPY WITH PROPOFOL;  Surgeon: Toledo, Benay Pike, MD;  Location: [facility];  Service: Gastroenterology;  Laterality: N/A;  DM  ? CYSTOSCOPY N/A 12/13/2017  ? Procedure: CYSTOSCOPY;  Surgeon: Hollice Espy, MD;  Location: [facility];  Service: Urology;  Laterality: N/A;  ? DIRECT LARYNGOSCOPY N/A 11/24/2020  ? Procedure: MICRO DIRECT LARYNGOSCOPY W/BIOPSY;  Surgeon: Beverly Gust, MD;  Location: [facility];  Service: ENT;  Laterality: N/A;  ? HERNIA REPAIR Left   ? groin  ? LEG SURGERY Right   ? Had surgery on right calf X 7  ? LITHOTRIPSY    ? LUMBAR WOUND DEBRIDEMENT N/A 11/28/2019  ? Procedure: LUMBAR WOUND DEBRIDEMENT;  Surgeon: Newman Pies, MD;  Location: [facility];  Service: Neurosurgery;  Laterality: N/A;  LUMBAR WOUND DEBRIDEMENT  ? QUADRICEPS TENDON REPAIR Left 06/22/2015  ? Procedure: REPAIR QUADRICEP TENDON;  Surgeon: Earnestine Leys, MD;  Location: [facility];  Service: Orthopedics;  Laterality: Left;  ? QUADRICEPS TENDON REPAIR Left 08/12/2015  ? Procedure: REPAIR QUADRICEP TENDON;  Surgeon: Earnestine Leys, MD;  Location: [facility];  Service: Orthopedics;  Laterality: Left;  ? ROTATOR CUFF REPAIR Left   ? TONSILLECTOMY    ? VASECTOMY    ? with hydrocele repaired  ? ?Social History  ? ?Socioeconomic History  ? Marital status: Married  ?  Spouse name: April  ? Number of children: Not on file  ? Years of education: Not on file  ? Highest education level: Not on file  ?Occupational History  ? Not on file  ?Tobacco Use  ? Smoking status: Never  ?  Passive exposure: Never  ? Smokeless tobacco: Never  ?Vaping Use  ? Vaping Use: Never used  ?Substance and Sexual Activity  ? Alcohol use: No  ? Drug use: No  ? Sexual activity: Yes  ?  Birth control/protection: None   ?Other Topics Concern  ? Not on file  ?Social History Narrative  ? Not on file  ? ?Social Determinants of Health  ? ?Financial Resource Strain: Not on file  ?Food Insecurity: Not on file  ?Transportation Needs: Not on file  ?Physical Activity: Not on file  ?Stress: Not on file  ?Social Connections: Not on file  ?Intimate Partner Violence: Not on file  ? ?Family Status  ?Relation Name Status  ? Father  Deceased  ? Mother  Alive  ? Sister  Alive  ? Brother  Alive  ? Daughter  Alive  ? Brother  Alive  ? Neg Hx  (Not Specified)  ? ?Family History  ?Problem Relation Age of Onset  ? Prostate cancer Father   ? Heart disease Father   ? Diabetes Father   ? Alzheimer's disease Father   ? Anemia Mother   ? Osteoporosis Mother   ? Healthy Sister   ? Diabetes Brother   ? Hypertension Brother   ? Anemia Daughter   ? Diabetes Brother   ? Kidney disease Neg Hx   ? Bladder Cancer Neg Hx   ? Colon cancer Neg Hx   ? ?Allergies  ?Allergen Reactions  ? Methocarbamol Other (See Comments)  ?  Numbness, and change of taste   ? Tizanidine Nausea Only  ? Vicodin [Hydrocodone-Acetaminophen] Itching  ?  Can take with benadryl   ?  ?Patient Care Team: ?Jerrol Banana., MD as PCP - General (Family Medicine)  ? ?Medications: ?Outpatient Medications Prior to Visit  ?Medication Sig  ? albuterol (VENTOLIN HFA) 108 (90 Base) MCG/ACT inhaler Inhale 2 puffs into the lungs every 6 (six) hours as needed for wheezing or shortness of breath.  ? amLODipine-benazepril (LOTREL) 10-40 MG capsule TAKE 1 CAPSULE BY MOUTH EVERY DAY  ? aspirin EC 81 MG tablet Take 81 mg by mouth daily.   ? atorvastatin (LIPITOR) 40 MG tablet Take 40 mg by mouth daily.  ? carisoprodol (SOMA) 350 MG tablet Take 350 mg by mouth 4 (four) times daily.  ? cetirizine (ZYRTEC) 10 MG tablet TAKE ONE TO TWO TABLETS BY MOUTH EVERY DAY AS NEEDED.  ? chlorpheniramine-HYDROcodone (TUSSIONEX PENNKINETIC ER) 10-8 MG/5ML SUER Take 5 mLs by mouth every 12 (twelve) hours as needed for cough.   ? cyclobenzaprine (FLEXERIL) 10 MG tablet Take 1 tablet (10 mg total) by mouth at bedtime.  ? FARXIGA 10 MG TABS tablet TAKE 1 TABLET BY MOUTH EVERY DAY BEFORE BREAKFAST  ? finasteride (PROSCAR) 5 MG tablet Take 1 tablet (5 mg total) by mouth daily.  ? fluticasone (FLONASE) 50 MCG/ACT nasal spray USE TWO SPRAYS IN EACH NOSTRIL EVERY DAY  ? gabapentin (NEURONTIN) 300 MG capsule  Take 300-600 mg by mouth See admin instructions. 600 mg in the morning, and 300 mg at bedtime  ? hydrochlorothiazide (HYDRODIURIL) 25 MG tablet TAKE 1 TABLET BY MOUTH EVERY DAY  ? isosorbide mononitrate (IMDUR) 30 MG 24 hr tablet Take 30 mg by mouth daily.  ? meloxicam (MOBIC) 15 MG tablet Take 15 mg by mouth daily.  ? metoprolol tartrate (LOPRESSOR) 25 MG tablet TAKE 1 TABLET (25 MG TOTAL) BY MOUTH DAILY.  ? montelukast (SINGULAIR) 10 MG tablet TAKE 1 TABLET BY MOUTH EVERY DAY IN THE MORNING  ? Multiple Vitamin (MULTIVITAMIN WITH MINERALS) TABS tablet Take 1 tablet by mouth daily.  ? omeprazole (PRILOSEC) 20 MG capsule Take 20 mg by mouth daily.   ? oxyCODONE-acetaminophen (PERCOCET) 10-325 MG tablet Take 1 tablet by mouth every 4 (four) hours as needed for pain.  ? sertraline (ZOLOFT) 50 MG tablet TAKE 1 TABLET BY MOUTH EVERY DAY  ? tamsulosin (FLOMAX) 0.4 MG CAPS capsule 2 capsules by mouth daily  ? ?No facility-administered medications prior to visit.  ? ? ?Review of Systems  ?Constitutional: Negative.   ?HENT: Negative.    ?Eyes: Negative.   ?Respiratory: Negative.    ?Cardiovascular: Negative.   ?Gastrointestinal: Negative.   ?Endocrine: Negative.   ?Genitourinary: Negative.   ?Musculoskeletal: Negative.   ?Skin: Negative.   ?Allergic/Immunologic: Negative.   ?Neurological: Negative.   ?Hematological: Negative.   ?Psychiatric/Behavioral: Negative.    ? ?Last lipids ?Lab Results  ?Component Value Date  ? CHOL 117 01/20/2021  ? HDL 30 (L) 01/20/2021  ? [place] 62 01/20/2021  ? TRIG 142 01/20/2021  ? ?  ? Objective  ? ?  ?BP 124/76 (BP  Location: Left Arm, Patient Position: Sitting, Cuff Size: Large)   Temp 98.5 ?F (36.9 ?C) (Oral)   Wt 284 lb (128.8 kg)   SpO2 95%   BMI 44.48 kg/m?  ?BP Readings from Last 3 Encounters:  ?04/04/22 124/76  ?03/21/22 (

## 2022-04-05 LAB — COMPREHENSIVE METABOLIC PANEL
ALT: 39 IU/L (ref 0–44)
AST: 30 IU/L (ref 0–40)
Albumin/Globulin Ratio: 1.6 (ref 1.2–2.2)
Albumin: 4.5 g/dL (ref 3.8–4.8)
Alkaline Phosphatase: 59 IU/L (ref 44–121)
BUN/Creatinine Ratio: 19 (ref 10–24)
BUN: 17 mg/dL (ref 8–27)
Bilirubin Total: 0.7 mg/dL (ref 0.0–1.2)
CO2: 23 mmol/L (ref 20–29)
Calcium: 9.4 mg/dL (ref 8.6–10.2)
Chloride: 100 mmol/L (ref 96–106)
Creatinine, Ser: 0.91 mg/dL (ref 0.76–1.27)
Globulin, Total: 2.8 g/dL (ref 1.5–4.5)
Glucose: 137 mg/dL — ABNORMAL HIGH (ref 70–99)
Potassium: 4 mmol/L (ref 3.5–5.2)
Sodium: 137 mmol/L (ref 134–144)
Total Protein: 7.3 g/dL (ref 6.0–8.5)
eGFR: 96 mL/min/{1.73_m2} (ref >59)

## 2022-04-05 LAB — TSH: TSH: 1.16 u[IU]/mL (ref 0.450–4.500)

## 2022-04-05 LAB — CBC WITH DIFFERENTIAL/PLATELET
Basophils Absolute: 0.1 10*3/uL (ref 0.0–0.2)
Basos: 1 %
EOS (ABSOLUTE): 0.2 10*3/uL (ref 0.0–0.4)
Eos: 3 %
Hematocrit: 36.2 % — ABNORMAL LOW (ref 37.5–51.0)
Hemoglobin: 11.6 g/dL — ABNORMAL LOW (ref 13.0–17.7)
Immature Grans (Abs): 0 10*3/uL (ref 0.0–0.1)
Immature Granulocytes: 0 %
Lymphocytes Absolute: 1.4 10*3/uL (ref 0.7–3.1)
Lymphs: 26 %
MCH: 20.4 pg — ABNORMAL LOW (ref 26.6–33.0)
MCHC: 32 g/dL (ref 31.5–35.7)
MCV: 64 fL — ABNORMAL LOW (ref 79–97)
Monocytes Absolute: 0.5 10*3/uL (ref 0.1–0.9)
Monocytes: 9 %
Neutrophils Absolute: 3.2 10*3/uL (ref 1.4–7.0)
Neutrophils: 61 %
Platelets: 210 10*3/uL (ref 150–450)
RBC: 5.7 x10E6/uL (ref 4.14–5.80)
RDW: 16.8 % — ABNORMAL HIGH (ref 11.6–15.4)
WBC: 5.3 10*3/uL (ref 3.4–10.8)

## 2022-04-05 LAB — PSA: Prostate Specific Ag, Serum: 1.6 ng/mL (ref 0.0–4.0)

## 2022-04-05 LAB — HEMOGLOBIN A1C
Est. average glucose Bld gHb Est-mCnc: 143 mg/dL
Hgb A1c MFr Bld: 6.6 % — ABNORMAL HIGH (ref 4.8–5.6)

## 2022-04-08 DIAGNOSIS — M4807 Spinal stenosis, lumbosacral region: Secondary | ICD-10-CM | POA: Diagnosis not present

## 2022-04-08 DIAGNOSIS — M4317 Spondylolisthesis, lumbosacral region: Secondary | ICD-10-CM | POA: Diagnosis not present

## 2022-04-08 DIAGNOSIS — Z6841 Body Mass Index (BMI) 40.0 and over, adult: Secondary | ICD-10-CM | POA: Diagnosis not present

## 2022-04-13 NOTE — Progress Notes (Incomplete)
04/13/22 ?9:05 PM  ? ?Justin Soto ?[birth year] ?[id number] ? ?Referring provider:  ?Jerrol Banana., MD ?[place] ?[address] ?[place],  [place] [address] ?No chief complaint on file. ? ? ?Urological history  ?T1c low risk prostate cancer  ?- PSA 1.6 in 03/2022 ?- Biopsy 07/2018; abnormal PSA velocity (2.0 uncorrected); 48 g  ?- 4/12 cores; Gleason 3+3 adenocarcinoma ?- Elected active surveillance ?- MRI 08/2020; 31 g prostate; no high-grade lesions noted ?- Confirmatory biopsy 12/09/2020; 31 g volume -1/12 cores; Gleason 3+3 adenocarcinoma 5% ?- has follow up in July 2023 ?  ?2.  BPH with LUTS ?- Combination therapy tamsulosin/finasteride ?  ?3.  History stone disease ?- Prior SWL ?- CT 06/2017 with 3 mm right upper pole calculus ?- CT 03/2022 - possible 2-3 mm left proximal ureteral stone  ?- CT renal stone study on 03/21/2022 visualized No hydronephrosis. Bilateral nonobstructing renal stones. Largest is a upper pole stone measuring 5 mm. Simple appearing cyst of the ?lower pole the left kidney ? ? ?HPI: ?Justin Soto is a 61 y.o.male who presents today for follow-up to discuss CT.  ? ? ? ? ? ? ?PMH: ?Past Medical History:  ?Diagnosis Date  ? Anemia   ? Arthritis   ? Diabetes mellitus without complication ([place])   ? diet controlled  ? Dyspnea   ? Enlarged prostate   ? hx of  ? Environmental and seasonal allergies   ? Frequency of urination   ? GERD (gastroesophageal reflux disease)   ? History of kidney stones   ? HOH (hard of hearing)   ? wears bilateral hearing aids  ? Hypertension   ? Irregular heart beat   ? Pt feels this every once in a while; PCP aware but stated it was OK  ? Kidney stone on right side 01/15/2017  ? Leg pain, right   ? Take Lyrica and Naproxen  ? MRSA (methicillin resistant staph aureus) culture positive   ? Neck pain of over 3 months duration   ? Prostate cancer ([facility])   ? Sleep apnea   ? cpap-15 setting  ? Thalassemia   ? Tick fever   ? ? ?Surgical History: ?Past Surgical History:   ?Procedure Laterality Date  ? ANTERIOR CERVICAL DECOMP/DISCECTOMY FUSION N/A 11/13/2014  ? Procedure: Cervical three-four, Cervical six-seven anterior cervical decompression with fusion interbody prosthesis plating and bonegraft;  Surgeon: Newman Pies, MD;  Location: [facility];  Service: Neurosurgery;  Laterality: N/A;  Cervical three-four, Cervical six-seven anterior cervical decompression with fusion interbody prosthesis plating and bonegraft  ? BACK SURGERY    ? Lumbar X 2  ? CALCANEAL OSTEOTOMY Right 01/11/2017  ? Procedure: CALCANEAL OSTEOTOMY;  Surgeon: Earnestine Leys, MD;  Location: [facility];  Service: Orthopedics;  Laterality: Right;  ? CIRCUMCISION N/A 12/13/2017  ? Procedure: PENILE BLOCK, LYSIS OF PENILE ADHESIONS;  Surgeon: Hollice Espy, MD;  Location: [facility];  Service: Urology;  Laterality: N/A;  ? COLONOSCOPY    ? COLONOSCOPY WITH PROPOFOL N/A 09/15/2021  ? Procedure: COLONOSCOPY WITH PROPOFOL;  Surgeon: Toledo, Benay Pike, MD;  Location: [facility];  Service: Gastroenterology;  Laterality: N/A;  DM  ? CYSTOSCOPY N/A 12/13/2017  ? Procedure: CYSTOSCOPY;  Surgeon: Hollice Espy, MD;  Location: [facility];  Service: Urology;  Laterality: N/A;  ? DIRECT LARYNGOSCOPY N/A 11/24/2020  ? Procedure: MICRO DIRECT LARYNGOSCOPY W/BIOPSY;  Surgeon: Beverly Gust, MD;  Location: [facility];  Service: ENT;  Laterality: N/A;  ? HERNIA REPAIR Left   ?  groin  ? LEG SURGERY Right   ? Had surgery on right calf X 7  ? LITHOTRIPSY    ? LUMBAR WOUND DEBRIDEMENT N/A 11/28/2019  ? Procedure: LUMBAR WOUND DEBRIDEMENT;  Surgeon: Newman Pies, MD;  Location: [facility];  Service: Neurosurgery;  Laterality: N/A;  LUMBAR WOUND DEBRIDEMENT  ? QUADRICEPS TENDON REPAIR Left 06/22/2015  ? Procedure: REPAIR QUADRICEP TENDON;  Surgeon: Earnestine Leys, MD;  Location: [facility];  Service: Orthopedics;  Laterality: Left;  ? QUADRICEPS TENDON REPAIR Left 08/12/2015  ? Procedure: REPAIR QUADRICEP TENDON;  Surgeon: Earnestine Leys, MD;  Location:  [facility];  Service: Orthopedics;  Laterality: Left;  ? ROTATOR CUFF REPAIR Left   ? TONSILLECTOMY    ? VASECTOMY    ? with hydrocele repaired  ? ? ?Home Medications:  ?Allergies as of 04/14/2022   ? ?   Reactions  ? Methocarbamol Other (See Comments)  ? Numbness, and change of taste   ? Tizanidine Nausea Only  ? Vicodin [hydrocodone-acetaminophen] Itching  ? Can take with benadryl   ? ?  ? ?  ?Medication List  ?  ? ?  ? Accurate as of Apr 13, 2022  9:05 PM. If you have any questions, ask your nurse or doctor.  ?  ?  ? ?  ? ?albuterol 108 (90 Base) MCG/ACT inhaler ?Commonly known as: VENTOLIN HFA ?Inhale 2 puffs into the lungs every 6 (six) hours as needed for wheezing or shortness of breath. ?  ?amLODipine-benazepril 10-40 MG capsule ?Commonly known as: LOTREL ?TAKE 1 CAPSULE BY MOUTH EVERY DAY ?  ?aspirin EC 81 MG tablet ?Take 81 mg by mouth daily. ?  ?atorvastatin 40 MG tablet ?Commonly known as: LIPITOR ?Take 40 mg by mouth daily. ?  ?carisoprodol 350 MG tablet ?Commonly known as: SOMA ?Take 350 mg by mouth 4 (four) times daily. ?  ?cetirizine 10 MG tablet ?Commonly known as: ZYRTEC ?TAKE ONE TO TWO TABLETS BY MOUTH EVERY DAY AS NEEDED. ?  ?chlorpheniramine-HYDROcodone 10-8 MG/5ML Suer ?Commonly known as: Tussionex Pennkinetic ER ?Take 5 mLs by mouth every 12 (twelve) hours as needed for cough. ?  ?cyclobenzaprine 10 MG tablet ?Commonly known as: FLEXERIL ?Take 1 tablet (10 mg total) by mouth at bedtime. ?  ?Farxiga 10 MG Tabs tablet ?Generic drug: dapagliflozin propanediol ?TAKE 1 TABLET BY MOUTH EVERY DAY BEFORE BREAKFAST ?  ?finasteride 5 MG tablet ?Commonly known as: PROSCAR ?Take 1 tablet (5 mg total) by mouth daily. ?  ?fluticasone 50 MCG/ACT nasal spray ?Commonly known as: FLONASE ?USE TWO SPRAYS IN EACH NOSTRIL EVERY DAY ?  ?gabapentin 300 MG capsule ?Commonly known as: NEURONTIN ?Take 300-600 mg by mouth See admin instructions. 600 mg in the morning, and 300 mg at bedtime ?  ?hydrochlorothiazide 25 MG  tablet ?Commonly known as: HYDRODIURIL ?TAKE 1 TABLET BY MOUTH EVERY DAY ?  ?isosorbide mononitrate 30 MG 24 hr tablet ?Commonly known as: IMDUR ?Take 30 mg by mouth daily. ?  ?meloxicam 15 MG tablet ?Commonly known as: MOBIC ?Take 15 mg by mouth daily. ?  ?metoprolol tartrate 25 MG tablet ?Commonly known as: LOPRESSOR ?TAKE 1 TABLET (25 MG TOTAL) BY MOUTH DAILY. ?  ?montelukast 10 MG tablet ?Commonly known as: SINGULAIR ?TAKE 1 TABLET BY MOUTH EVERY DAY IN THE MORNING ?  ?multivitamin with minerals Tabs tablet ?Take 1 tablet by mouth daily. ?  ?omeprazole 20 MG capsule ?Commonly known as: PRILOSEC ?Take 20 mg by mouth daily. ?  ?oxyCODONE-acetaminophen 10-325 MG tablet ?Commonly known  as: Percocet ?Take 1 tablet by mouth every 4 (four) hours as needed for pain. ?  ?sertraline 50 MG tablet ?Commonly known as: ZOLOFT ?TAKE 1 TABLET BY MOUTH EVERY DAY ?  ?tamsulosin 0.4 MG Caps capsule ?Commonly known as: FLOMAX ?2 capsules by mouth daily ?  ? ?  ? ? ?Allergies:  ?Allergies  ?Allergen Reactions  ? Methocarbamol Other (See Comments)  ?  Numbness, and change of taste   ? Tizanidine Nausea Only  ? Vicodin [Hydrocodone-Acetaminophen] Itching  ?  Can take with benadryl   ? ? ?Family History: ?Family History  ?Problem Relation Age of Onset  ? Prostate cancer Father   ? Heart disease Father   ? Diabetes Father   ? Alzheimer's disease Father   ? Anemia Mother   ? Osteoporosis Mother   ? Healthy Sister   ? Diabetes Brother   ? Hypertension Brother   ? Anemia Daughter   ? Diabetes Brother   ? Kidney disease Neg Hx   ? Bladder Cancer Neg Hx   ? Colon cancer Neg Hx   ? ? ?Social History:  reports that he has never smoked. He has never been exposed to tobacco smoke. He has never used smokeless tobacco. He reports that he does not drink alcohol and does not use drugs. ? ? ?Physical Exam: ?There were no vitals taken for this visit.  ?Constitutional:  Alert and oriented, No acute distress. ?HEENT: [place] AT, moist mucus membranes.   Trachea midline, no masses. ?Cardiovascular: No clubbing, cyanosis, or edema. ?Respiratory: Normal respiratory effort, no increased work of breathing. ?GI: Abdomen is soft, nontender, nondistended, no abdominal masse

## 2022-04-14 ENCOUNTER — Ambulatory Visit: Payer: BC Managed Care – PPO | Admitting: Urology

## 2022-04-18 ENCOUNTER — Other Ambulatory Visit: Payer: Self-pay | Admitting: Neurosurgery

## 2022-04-18 DIAGNOSIS — M4317 Spondylolisthesis, lumbosacral region: Secondary | ICD-10-CM

## 2022-04-20 ENCOUNTER — Telehealth: Payer: Self-pay

## 2022-04-20 NOTE — Telephone Encounter (Signed)
Josh from [facility] confirmed receipt of orders and reports that delivery is pending for CPAP.  ?

## 2022-04-27 DIAGNOSIS — G4733 Obstructive sleep apnea (adult) (pediatric): Secondary | ICD-10-CM | POA: Diagnosis not present

## 2022-05-03 ENCOUNTER — Ambulatory Visit
Admission: RE | Admit: 2022-05-03 | Discharge: 2022-05-03 | Disposition: A | Discharge status: Home / Self Care | Payer: BC Managed Care – PPO | Source: Ambulatory Visit | Attending: Neurosurgery | Admitting: Neurosurgery

## 2022-05-03 DIAGNOSIS — M4317 Spondylolisthesis, lumbosacral region: Secondary | ICD-10-CM | POA: Insufficient documentation

## 2022-05-03 DIAGNOSIS — M5136 Other intervertebral disc degeneration, lumbar region: Secondary | ICD-10-CM | POA: Diagnosis not present

## 2022-05-03 MED ORDER — GADOBUTROL 1 MMOL/ML IV SOLN
10.0000 mL | Freq: Once | INTRAVENOUS | Status: AC | PRN
  Administered 2022-05-03: 10 mL via INTRAVENOUS

## 2022-05-17 DIAGNOSIS — M4807 Spinal stenosis, lumbosacral region: Secondary | ICD-10-CM | POA: Diagnosis not present

## 2022-05-17 DIAGNOSIS — Z6841 Body Mass Index (BMI) 40.0 and over, adult: Secondary | ICD-10-CM | POA: Diagnosis not present

## 2022-05-17 DIAGNOSIS — M4317 Spondylolisthesis, lumbosacral region: Secondary | ICD-10-CM | POA: Diagnosis not present

## 2022-05-28 DIAGNOSIS — G4733 Obstructive sleep apnea (adult) (pediatric): Secondary | ICD-10-CM | POA: Diagnosis not present

## 2022-06-16 ENCOUNTER — Other Ambulatory Visit: Payer: Self-pay | Admitting: Family Medicine

## 2022-06-16 DIAGNOSIS — N401 Enlarged prostate with lower urinary tract symptoms: Secondary | ICD-10-CM

## 2022-06-21 ENCOUNTER — Other Ambulatory Visit: Payer: Self-pay | Admitting: Family Medicine

## 2022-06-21 ENCOUNTER — Other Ambulatory Visit: Payer: Self-pay | Admitting: Urology

## 2022-06-21 DIAGNOSIS — N401 Enlarged prostate with lower urinary tract symptoms: Secondary | ICD-10-CM

## 2022-06-27 DIAGNOSIS — G4733 Obstructive sleep apnea (adult) (pediatric): Secondary | ICD-10-CM | POA: Diagnosis not present

## 2022-06-28 ENCOUNTER — Other Ambulatory Visit: Payer: Self-pay

## 2022-07-01 ENCOUNTER — Ambulatory Visit: Payer: Self-pay | Admitting: Urology

## 2022-07-12 ENCOUNTER — Other Ambulatory Visit: Payer: BC Managed Care – PPO

## 2022-07-12 DIAGNOSIS — N401 Enlarged prostate with lower urinary tract symptoms: Secondary | ICD-10-CM | POA: Diagnosis not present

## 2022-07-13 LAB — PSA: Prostate Specific Ag, Serum: 1.9 ng/mL (ref 0.0–4.0)

## 2022-07-15 ENCOUNTER — Encounter: Payer: Self-pay | Admitting: Urology

## 2022-07-15 ENCOUNTER — Ambulatory Visit: Payer: BC Managed Care – PPO | Admitting: Urology

## 2022-07-15 VITALS — BP 139/81 | HR 93 | Ht 67.0 in | Wt 280.0 lb

## 2022-07-15 DIAGNOSIS — N200 Calculus of kidney: Secondary | ICD-10-CM

## 2022-07-15 DIAGNOSIS — Z8546 Personal history of malignant neoplasm of prostate: Secondary | ICD-10-CM | POA: Diagnosis not present

## 2022-07-15 DIAGNOSIS — N401 Enlarged prostate with lower urinary tract symptoms: Secondary | ICD-10-CM | POA: Diagnosis not present

## 2022-07-15 DIAGNOSIS — C61 Malignant neoplasm of prostate: Secondary | ICD-10-CM

## 2022-07-15 NOTE — Progress Notes (Signed)
07/15/2022 12:46 PM   Justin Soto 1961/05/24 [id number]  Referring provider: Jerrol Banana., MD [address] [place],   [address]  Chief Complaint  Patient presents with   Prostate Cancer    Urologic history: 1.  T1c low risk prostate cancer Biopsy 07/2018; abnormal PSA velocity (2.0 uncorrected); 48 g  4/12 cores; Gleason 3+3 adenocarcinoma Elected active surveillance MRI 08/2020; 31 g prostate; no high-grade lesions noted Confirmatory biopsy 12/09/2020; 31 g volume  1/12 cores; Gleason 3+3 adenocarcinoma 5%  2.  BPH with LUTS Combination therapy tamsulosin/finasteride  3.  History stone disease Prior SWL CT 06/2017 with 3 mm right upper pole calculus CT 03/2022 bilateral, nonobstructing renal calculi   HPI: 61 y.o. male presents for annual follow-up.  No bothersome LUTS Remains on tamsulosin/finasteride Denies dysuria, gross hematuria Denies flank, abdominal or pelvic pain Saw [name] 03/21/2022 with dysuria and urinary hesitancy.  CT was ordered with possible 2 mm left proximal ureteral calculus.  His symptoms resolved within a week after that visit PSA 07/12/2022 stable 1.9 (uncorrected)     PMH: Past Medical History:  Diagnosis Date   Anemia    Arthritis    Diabetes mellitus without complication ([facility])    diet controlled   Dyspnea    Enlarged prostate    hx of   Environmental and seasonal allergies    Frequency of urination    GERD (gastroesophageal reflux disease)    History of kidney stones    HOH (hard of hearing)    wears bilateral hearing aids   Hypertension    Irregular heart beat    Pt feels this every once in a while; PCP aware but stated it was OK   Kidney stone on right side 01/15/2017   Leg pain, right    Take Lyrica and Naproxen   MRSA (methicillin resistant staph aureus) culture positive    Neck pain of over 3 months duration    Prostate cancer ([place])    Sleep apnea    cpap-15 setting   Thalassemia    Tick  fever     Surgical History: Past Surgical History:  Procedure Laterality Date   ANTERIOR CERVICAL DECOMP/DISCECTOMY FUSION N/A 11/13/2014   Procedure: Cervical three-four, Cervical six-seven anterior cervical decompression with fusion interbody prosthesis plating and bonegraft;  Surgeon: Newman Pies, MD;  Location: [facility];  Service: Neurosurgery;  Laterality: N/A;  Cervical three-four, Cervical six-seven anterior cervical decompression with fusion interbody prosthesis plating and bonegraft   BACK SURGERY     Lumbar X 2   CALCANEAL OSTEOTOMY Right 01/11/2017   Procedure: CALCANEAL OSTEOTOMY;  Surgeon: Earnestine Leys, MD;  Location: [facility];  Service: Orthopedics;  Laterality: Right;   CIRCUMCISION N/A 12/13/2017   Procedure: PENILE BLOCK, LYSIS OF PENILE ADHESIONS;  Surgeon: Hollice Espy, MD;  Location: [facility];  Service: Urology;  Laterality: N/A;   COLONOSCOPY     COLONOSCOPY WITH PROPOFOL N/A 09/15/2021   Procedure: COLONOSCOPY WITH PROPOFOL;  Surgeon: Toledo, Benay Pike, MD;  Location: [facility];  Service: Gastroenterology;  Laterality: N/A;  DM   CYSTOSCOPY N/A 12/13/2017   Procedure: CYSTOSCOPY;  Surgeon: Hollice Espy, MD;  Location: [facility];  Service: Urology;  Laterality: N/A;   DIRECT LARYNGOSCOPY N/A 11/24/2020   Procedure: MICRO DIRECT LARYNGOSCOPY W/BIOPSY;  Surgeon: Beverly Gust, MD;  Location: [facility];  Service: ENT;  Laterality: N/A;   HERNIA REPAIR Left    groin   LEG SURGERY Right    Had  surgery on right calf X 7   LITHOTRIPSY     LUMBAR WOUND DEBRIDEMENT N/A 11/28/2019   Procedure: LUMBAR WOUND DEBRIDEMENT;  Surgeon: Newman Pies, MD;  Location: [facility];  Service: Neurosurgery;  Laterality: N/A;  LUMBAR WOUND DEBRIDEMENT   QUADRICEPS TENDON REPAIR Left 06/22/2015   Procedure: REPAIR QUADRICEP TENDON;  Surgeon: Earnestine Leys, MD;  Location: [facility];  Service: Orthopedics;  Laterality: Left;   QUADRICEPS TENDON REPAIR Left 08/12/2015   Procedure: REPAIR  QUADRICEP TENDON;  Surgeon: Earnestine Leys, MD;  Location: [facility];  Service: Orthopedics;  Laterality: Left;   ROTATOR CUFF REPAIR Left    TONSILLECTOMY     VASECTOMY     with hydrocele repaired    Home Medications:  Allergies as of 07/15/2022       Reactions   Methocarbamol Other (See Comments)   Numbness, and change of taste    Tizanidine Nausea Only   Vicodin [hydrocodone-acetaminophen] Itching   Can take with benadryl         Medication List        Accurate as of July 15, 2022 12:46 PM. If you have any questions, ask your nurse or doctor.          albuterol 108 (90 Base) MCG/ACT inhaler Commonly known as: VENTOLIN HFA Inhale 2 puffs into the lungs every 6 (six) hours as needed for wheezing or shortness of breath.   amLODipine-benazepril 10-40 MG capsule Commonly known as: LOTREL TAKE 1 CAPSULE BY MOUTH EVERY DAY   aspirin EC 81 MG tablet Take 81 mg by mouth daily.   atorvastatin 40 MG tablet Commonly known as: LIPITOR Take 40 mg by mouth daily.   carisoprodol 350 MG tablet Commonly known as: SOMA Take 350 mg by mouth 4 (four) times daily.   cetirizine 10 MG tablet Commonly known as: ZYRTEC TAKE ONE TO TWO TABLETS BY MOUTH EVERY DAY AS NEEDED.   chlorpheniramine-HYDROcodone 10-8 MG/5ML Suer Commonly known as: Tussionex Pennkinetic ER Take 5 mLs by mouth every 12 (twelve) hours as needed for cough.   cyclobenzaprine 10 MG tablet Commonly known as: FLEXERIL Take 1 tablet (10 mg total) by mouth at bedtime.   Farxiga 10 MG Tabs tablet Generic drug: dapagliflozin propanediol TAKE 1 TABLET BY MOUTH EVERY DAY BEFORE BREAKFAST   finasteride 5 MG tablet Commonly known as: PROSCAR TAKE 1 TABLET (5 MG TOTAL) BY MOUTH DAILY.   fluticasone 50 MCG/ACT nasal spray Commonly known as: FLONASE USE TWO SPRAYS IN EACH NOSTRIL EVERY DAY   gabapentin 300 MG capsule Commonly known as: NEURONTIN Take 300-600 mg by mouth See admin instructions. 600 mg in the  morning, and 300 mg at bedtime   hydrochlorothiazide 25 MG tablet Commonly known as: HYDRODIURIL TAKE 1 TABLET BY MOUTH EVERY DAY   isosorbide mononitrate 30 MG 24 hr tablet Commonly known as: IMDUR Take 30 mg by mouth daily.   meloxicam 15 MG tablet Commonly known as: MOBIC Take 15 mg by mouth daily.   metoprolol tartrate 25 MG tablet Commonly known as: LOPRESSOR TAKE 1 TABLET (25 MG TOTAL) BY MOUTH DAILY.   montelukast 10 MG tablet Commonly known as: SINGULAIR TAKE 1 TABLET BY MOUTH EVERY DAY IN THE MORNING   multivitamin with minerals Tabs tablet Take 1 tablet by mouth daily.   omeprazole 20 MG capsule Commonly known as: PRILOSEC Take 20 mg by mouth daily.   oxyCODONE-acetaminophen 10-325 MG tablet Commonly known as: Percocet Take 1 tablet by mouth every 4 (four) hours  as needed for pain.   sertraline 50 MG tablet Commonly known as: ZOLOFT TAKE 1 TABLET BY MOUTH EVERY DAY   tamsulosin 0.4 MG Caps capsule Commonly known as: FLOMAX TAKE 2 CAPSULES BY MOUTH EVERY DAY        Allergies:  Allergies  Allergen Reactions   Methocarbamol Other (See Comments)    Numbness, and change of taste    Tizanidine Nausea Only   Vicodin [Hydrocodone-Acetaminophen] Itching    Can take with benadryl     Family History: Family History  Problem Relation Age of Onset   Prostate cancer Father    Heart disease Father    Diabetes Father    Alzheimer's disease Father    Anemia Mother    Osteoporosis Mother    Healthy Sister    Diabetes Brother    Hypertension Brother    Anemia Daughter    Diabetes Brother    Kidney disease Neg Hx    Bladder Cancer Neg Hx    Colon cancer Neg Hx     Social History:  reports that he has never smoked. He has never been exposed to tobacco smoke. He has never used smokeless tobacco. He reports that he does not drink alcohol and does not use drugs.   Physical Exam: BP 139/81   Pulse 93   Ht '5\' 7"'$  (1.702 m)   Wt 280 lb (127 kg)   BMI  43.85 kg/m   Constitutional:  Alert and oriented, No acute distress. HEENT: [place] AT Respiratory: Normal respiratory effort, no increased work of breathing    Assessment & Plan:    1.  T1c low risk prostate cancer Desires to continue active surveillance 6 month follow-up visit with [facility]  2.  BPH with LUTS Stable on tamsulosin/finasteride  3.  Nephrolithiasis CT 03/21/2022 with bilateral, nonobstructing renal calculi.  Largest 5 mm left upper pole    Abbie Sons, MD  [facility] [address] [place], [place] [address] [phone]

## 2022-07-23 ENCOUNTER — Other Ambulatory Visit: Payer: Self-pay | Admitting: Family Medicine

## 2022-07-28 DIAGNOSIS — G4733 Obstructive sleep apnea (adult) (pediatric): Secondary | ICD-10-CM | POA: Diagnosis not present

## 2022-08-05 ENCOUNTER — Other Ambulatory Visit: Payer: Self-pay | Admitting: Family Medicine

## 2022-08-12 ENCOUNTER — Ambulatory Visit: Payer: Self-pay

## 2022-08-12 NOTE — Patient Instructions (Signed)
Visit Information  Thank you for taking time to visit with me today. Please don't hesitate to contact me if I can be of assistance to you.   Following are the goals we discussed today:   Goals Addressed             This Visit's Progress    RNCM: Health and wellness       Care Coordination Interventions: Advised patient to call the [facility] for changes, questions or concerns with chronic health conditions. Provided information on how to reach the [facility] for needs. The patient states he feels his conditions are stable and no new needs at this time Provided education to patient re: the care coordination program and the nurse and team to be available to help with any new health and wellness needs  Reviewed scheduled/upcoming provider appointments including 10-04-2022 with pcp. The patient is aware his current pcp is leaving the practice. Has reviewed the note. Discussed plans with patient for ongoing care management follow up and provided patient with direct contact information for care management team Advised patient to discuss questions, concerns, or needs for effective management of chronic conditions with provider Screening for signs and symptoms of depression related to chronic disease state  Assessed social determinant of health barriers             Please call the care guide team at [phone] if you need to schedule an appointment.   If you are experiencing a Mental Health or [place] or need someone to talk to, please call the Suicide and Crisis Lifeline: 988 call the [place] National Suicide Prevention Lifeline: 819-778-6701 or TTY: 609-014-9485 TTY (910)691-1392) to talk to a trained counselor call 1-800-273-TALK (toll free, 24 hour hotline)  Patient verbalizes understanding of instructions and care plan provided today and agrees to view in [place]. Active MyChart status and patient understanding of how to access instructions and care plan via MyChart confirmed  with patient.     No further follow up required: the patient is stable at this time. Provided the patient with RNCM number for assistance for further questions, concerns, or needs.  Noreene Larsson RN, MSN, CCM Community Care Coordinator [facility] Mobile: [phone]

## 2022-08-12 NOTE — Patient Outreach (Signed)
  Care Coordination   Initial Visit Note   08/12/2022 Name: Justin Soto MRN: [id number] DOB: 10/15/1961  Justin Soto is a 61 y.o. year old male who sees Jerrol Banana., MD for primary care. I spoke with  Justin Soto by phone today.  What matters to the patients health and wellness today?  Patient has no new concerns at this time. Health is stable. Knows how to reach [facility] for new needs     Goals Addressed             This Visit's Progress    RNCM: Health and wellness       Care Coordination Interventions: Advised patient to call the [facility] for changes, questions or concerns with chronic health conditions. Provided information on how to reach the [facility] for needs. The patient states he feels his conditions are stable and no new needs at this time Provided education to patient re: the care coordination program and the nurse and team to be available to help with any new health and wellness needs  Reviewed scheduled/upcoming provider appointments including 10-04-2022 with pcp. The patient is aware his current pcp is leaving the practice. Has reviewed the note. Discussed plans with patient for ongoing care management follow up and provided patient with direct contact information for care management team Advised patient to discuss questions, concerns, or needs for effective management of chronic conditions with provider Screening for signs and symptoms of depression related to chronic disease state  Assessed social determinant of health barriers           SDOH assessments and interventions completed:  Yes  SDOH Interventions Today    Flowsheet Row Most Recent Value  SDOH Interventions   Food Insecurity Interventions Intervention Not Indicated  Housing Interventions Intervention Not Indicated  Transportation Interventions Intervention Not Indicated        Care Coordination Interventions Activated:  Yes  Care Coordination Interventions:  Yes, provided   Follow up plan:  No further intervention required.   Encounter Outcome:  Pt. Visit Completed   Noreene Larsson RN, MSN, [facility] Mobile: [phone]

## 2022-08-23 ENCOUNTER — Other Ambulatory Visit: Payer: Self-pay | Admitting: Family Medicine

## 2022-08-23 DIAGNOSIS — I10 Essential (primary) hypertension: Secondary | ICD-10-CM

## 2022-08-24 NOTE — Telephone Encounter (Signed)
Requested Prescriptions  Pending Prescriptions Disp Refills  . montelukast (SINGULAIR) 10 MG tablet [Pharmacy Med Name: MONTELUKAST SOD 10 MG TABLET] 90 tablet 0    Sig: TAKE 1 TABLET BY MOUTH EVERY DAY IN THE MORNING     Pulmonology:  Leukotriene Inhibitors Passed - 08/23/2022  6:01 PM      Passed - Valid encounter within last 12 months    Recent Outpatient Visits          4 months ago Annual physical exam   [facility] Jerrol Banana., MD   5 months ago GAD (generalized anxiety disorder)   [facility] Jerrol Banana., MD   9 months ago Left otitis media, unspecified otitis media type   [facility] Birdie Sons, MD   10 months ago Type 2 diabetes mellitus without complication, without long-term current use of insulin [facility])   [facility] Jerrol Banana., MD   1 year ago Annual physical exam   [place], Clearnce Sorrel, [place]      Future Appointments            In 1 month Rosanna Randy, Retia Passe., MD [facility], [place]   In 4 months Stoioff, Ronda Fairly, MD [facility]           . hydrochlorothiazide (HYDRODIURIL) 25 MG tablet [Pharmacy Med Name: HYDROCHLOROTHIAZIDE 25 MG TAB] 90 tablet 0    Sig: TAKE 1 TABLET BY MOUTH EVERY DAY     Cardiovascular: Diuretics - Thiazide Passed - 08/23/2022  6:01 PM      Passed - Cr in normal range and within 180 days    Creatinine  Date Value Ref Range Status  08/11/2014 1.01 0.60 - 1.30 mg/dL Final   Creatinine, Ser  Date Value Ref Range Status  04/04/2022 0.91 0.76 - 1.27 mg/dL Final         Passed - K in normal range and within 180 days    Potassium  Date Value Ref Range Status  04/04/2022 4.0 3.5 - 5.2 mmol/L Final  08/11/2014 4.1 3.5 - 5.1 mmol/L Final         Passed - Na in normal range and within 180 days    Sodium  Date Value Ref Range Status  04/04/2022 137 134 - 144 mmol/L Final   08/11/2014 138 136 - 145 mmol/L Final         Passed - Last BP in normal range    BP Readings from Last 1 Encounters:  07/15/22 139/81         Passed - Valid encounter within last 6 months    Recent Outpatient Visits          4 months ago Annual physical exam   [facility] Jerrol Banana., MD   5 months ago GAD (generalized anxiety disorder)   [facility] Jerrol Banana., MD   9 months ago Left otitis media, unspecified otitis media type   [facility] Birdie Sons, MD   10 months ago Type 2 diabetes mellitus without complication, without long-term current use of insulin [facility])   [facility] Jerrol Banana., MD   1 year ago Annual physical exam   [place], Clearnce Sorrel, [place]      Future Appointments            In 1 month Jerrol Banana., MD  [employer], [place]   In 4 months [place], Ronda Fairly, MD [facility]

## 2022-08-28 DIAGNOSIS — G4733 Obstructive sleep apnea (adult) (pediatric): Secondary | ICD-10-CM | POA: Diagnosis not present

## 2022-09-06 ENCOUNTER — Other Ambulatory Visit: Payer: Self-pay | Admitting: Neurosurgery

## 2022-09-20 ENCOUNTER — Other Ambulatory Visit: Payer: Self-pay | Admitting: Family Medicine

## 2022-09-20 DIAGNOSIS — I10 Essential (primary) hypertension: Secondary | ICD-10-CM

## 2022-09-20 NOTE — Telephone Encounter (Signed)
Requested medication (s) are due for refill today: Yes Montelukast  Amlodopine  Requested medication (s) are on the active medication list: yes    Last refill: Montelukast  06/21/22  #90  0 refills      Amlodipine 03/30/22  #90 1 refill        HCTZ 08/24/22  #90  0 refills  Future visit scheduled Yes 10/04/22  Notes to clinic: Last refills per Dr. Rosanna Randy. Pt had appt scheduled with Dr. Rosanna Randy 10/04/22. Called pt and TOC appt secured for same day, 10/04/22 with Dr. Jamal Collin  Requested Prescriptions  Pending Prescriptions Disp Refills   montelukast (SINGULAIR) 10 MG tablet [Pharmacy Med Name: MONTELUKAST SOD 10 MG TABLET] 90 tablet 0    Sig: TAKE 1 TABLET BY MOUTH EVERY DAY IN THE MORNING     Pulmonology:  Leukotriene Inhibitors Passed - 09/20/2022 10:07 AM      Passed - Valid encounter within last 12 months    Recent Outpatient Visits           5 months ago Annual physical exam   [facility] Jerrol Banana., MD   6 months ago GAD (generalized anxiety disorder)   [facility] Jerrol Banana., MD   10 months ago Left otitis media, unspecified otitis media type   [facility] Birdie Sons, MD   11 months ago Type 2 diabetes mellitus without complication, without long-term current use of insulin ([place])   [facility] Jerrol Banana., MD   1 year ago Annual physical exam   [facility] [place], Clearnce Sorrel, [place]       Future Appointments             In 2 weeks Simmons-Robinson, Riki Sheer, MD [facility], [place]   In 4 months Stoioff, Ronda Fairly, MD [facility] [place]             amLODipine-benazepril (LOTREL) 10-40 MG capsule [Pharmacy Med Name: AMLODIPINE-BENAZEPRIL 10-40 MG] 90 capsule 0    Sig: TAKE 1 CAPSULE BY MOUTH EVERY DAY     Cardiovascular: CCB + ACEI Combos Passed - 09/20/2022 10:07 AM      Passed - Cr in normal range and within 180 days    Creatinine   Date Value Ref Range Status  08/11/2014 1.01 0.60 - 1.30 mg/dL Final   Creatinine, Ser  Date Value Ref Range Status  04/04/2022 0.91 0.76 - 1.27 mg/dL Final         Passed - K in normal range and within 180 days    Potassium  Date Value Ref Range Status  04/04/2022 4.0 3.5 - 5.2 mmol/L Final  08/11/2014 4.1 3.5 - 5.1 mmol/L Final         Passed - Na in normal range and within 180 days    Sodium  Date Value Ref Range Status  04/04/2022 137 134 - 144 mmol/L Final  08/11/2014 138 136 - 145 mmol/L Final         Passed - eGFR is 30 or above and within 180 days    EGFR (African American)  Date Value Ref Range Status  08/11/2014 >60  Final   GFR calc Af Amer  Date Value Ref Range Status  01/20/2021 92 >59 mL/min/1.73 Final    Comment:    **In accordance with recommendations from the NKF-ASN Task force,**   [facility] is in the process of updating its eGFR calculation to the  2021 CKD-EPI creatinine equation that estimates kidney function   without a race variable.    EGFR (Non-African Amer.)  Date Value Ref Range Status  08/11/2014 >60  Final    Comment:    eGFR values <1m/min/1.73 m2 may be an indication of chronic kidney disease (CKD). Calculated eGFR is useful in patients with stable renal function. The eGFR calculation will not be reliable in acutely ill patients when serum creatinine is changing rapidly. It is not useful in  patients on dialysis. The eGFR calculation may not be applicable to patients at the low and high extremes of body sizes, pregnant women, and vegetarians.    GFR, Estimated  Date Value Ref Range Status  01/31/2022 >60 >60 mL/min Final    Comment:    (NOTE) Calculated using the CKD-EPI Creatinine Equation (2021)    eGFR  Date Value Ref Range Status  04/04/2022 96 >59 mL/min/1.73 Final         Passed - Patient is not pregnant      Passed - Last BP in normal range    BP Readings from Last 1 Encounters:  07/15/22 139/81          Passed - Valid encounter within last 6 months    Recent Outpatient Visits           5 months ago Annual physical exam   [facility]   6 months ago GAD (generalized anxiety disorder)   [facility]   10 months ago Left otitis media, unspecified otitis media type   [facility]   11 months ago Type 2 diabetes mellitus without complication, without long-term current use of insulin ([facility]   [facility]   1 year ago Annual physical exam   [facility] [place]      Future Appointments             In 2 weeks Simmons-Robinson, MRiki Sheer MD [facility] [place]  In 4 months Stoioff, SRonda Fairly MD [facility]             hydrochlorothiazide (HYDRODIURIL) 25 MG tablet [Pharmacy Med Name: HYDROCHLOROTHIAZIDE 25 MG TAB] 90 tablet     Sig: TAKE 1 TABLET BY MOUTH EVERY DAY     Cardiovascular: Diuretics - Thiazide Passed - 09/20/2022 10:07 AM      Passed - Cr in normal range and within 180 days    Creatinine  Date Value Ref Range Status  08/11/2014 1.01 0.60 - 1.30 mg/dL Final   Creatinine, Ser  Date Value Ref Range Status  04/04/2022 0.91 0.76 - 1.27 mg/dL Final         Passed - K in normal range and within 180 days    Potassium  Date Value Ref Range Status  04/04/2022 4.0 3.5 - 5.2 mmol/L Final  08/11/2014 4.1 3.5 - 5.1 mmol/L Final         Passed - Na in normal range and within 180 days    Sodium  Date Value Ref Range Status  04/04/2022 137 134 - 144 mmol/L Final  08/11/2014 138 136 - 145 mmol/L Final         Passed - Last BP in normal range    BP Readings from Last 1 Encounters:  07/15/22 139/81         Passed - Valid  encounter within last 6 months    Recent Outpatient Visits           5 months ago Annual physical exam   [facility] Jerrol Banana., MD   6 months ago GAD (generalized anxiety disorder)   [facility] Jerrol Banana., MD   10 months ago Left otitis media, unspecified otitis media type   [facility] Birdie Sons, MD   11 months ago Type 2 diabetes mellitus without complication, without long-term current use of insulin [facility])   [facility] Jerrol Banana., MD   1 year ago Annual physical exam   [place], Clearnce Sorrel, [place]       Future Appointments             In 2 weeks Simmons-Robinson, Riki Sheer, MD [facility], [place]   In 4 months Stoioff, Ronda Fairly, MD [place]

## 2022-09-21 ENCOUNTER — Other Ambulatory Visit: Payer: Self-pay | Admitting: Neurosurgery

## 2022-09-25 DIAGNOSIS — G4733 Obstructive sleep apnea (adult) (pediatric): Secondary | ICD-10-CM | POA: Diagnosis not present

## 2022-09-30 ENCOUNTER — Encounter (HOSPITAL_COMMUNITY): Payer: Self-pay

## 2022-09-30 NOTE — Progress Notes (Signed)
Surgical Instructions    Your procedure is scheduled on Wednesday, 10/12/22.  Report to [facility] Main Entrance "A" at 6:30 A.M., then check in with the Admitting office.  Call this number if you have problems the morning of surgery:  [phone]   If you have any questions prior to your surgery date call [phone]: Open Monday-Friday 8am-4pm If you experience any cold or flu symptoms such as cough, fever, chills, shortness of breath, etc. between now and your scheduled surgery, please notify us at the above number     Remember:  Do not eat or drink after midnight the night before your surgery     Take these medicines the morning of surgery with A SIP OF WATER:  atorvastatin (LIPITOR)  cetirizine (ZYRTEC) finasteride (PROSCAR) fluticasone (FLONASE) gabapentin (NEURONTIN) metoprolol tartrate (LOPRESSOR)  montelukast (SINGULAIR) omeprazole (PRILOSEC)  sertraline (ZOLOFT) tamsulosin (FLOMAX)  IF NEEDED: oxyCODONE-acetaminophen (PERCOCET)  As of today, STOP taking any Aspirin (unless otherwise instructed by your surgeon) Aleve, Naproxen, Ibuprofen, Motrin, Advil, Goody's, BC's, all herbal medications, fish oil, meloxicam (MOBIC) and all vitamins.  WHAT DO I DO ABOUT MY DIABETES MEDICATION?   Do not take oral diabetes medicines (pills) the morning of surgery.  Do not take FARXIGA 72 hours prior to surgery. Your last dose will be 10/08/22.  The day of surgery, do not take other diabetes injectables, including Byetta (exenatide), Bydureon (exenatide ER), Victoza (liraglutide), or Trulicity (dulaglutide).  If your CBG is greater than 220 mg/dL, you may take  of your sliding scale (correction) dose of insulin.   HOW TO MANAGE YOUR DIABETES BEFORE AND AFTER SURGERY  Why is it important to control my blood sugar before and after surgery? Improving blood sugar levels before and after surgery helps healing and can limit problems. A way of improving blood sugar control is  eating a healthy diet by:  Eating less sugar and carbohydrates  Increasing activity/exercise  Talking with your doctor about reaching your blood sugar goals High blood sugars (greater than 180 mg/dL) can raise your risk of infections and slow your recovery, so you will need to focus on controlling your diabetes during the weeks before surgery. Make sure that the doctor who takes care of your diabetes knows about your planned surgery including the date and location.  How do I manage my blood sugar before surgery? Check your blood sugar at least 4 times a day, starting 2 days before surgery, to make sure that the level is not too high or low.  Check your blood sugar the morning of your surgery when you wake up and every 2 hours until you get to the Short Stay unit.  If your blood sugar is less than 70 mg/dL, you will need to treat for low blood sugar: Do not take insulin. Treat a low blood sugar (less than 70 mg/dL) with  cup of clear juice (cranberry or apple), 4 glucose tablets, OR glucose gel. Recheck blood sugar in 15 minutes after treatment (to make sure it is greater than 70 mg/dL). If your blood sugar is not greater than 70 mg/dL on recheck, call [phone] for further instructions. Report your blood sugar to the short stay nurse when you get to Short Stay.  If you are admitted to the hospital after surgery: Your blood sugar will be checked by the staff and you will probably be given insulin after surgery (instead of oral diabetes medicines) to make sure you have good blood sugar levels. The goal for blood sugar control  after surgery is 80-180 mg/dL.     Do not wear jewelry or makeup. Do not wear lotions, powders, cologne or deodorant. Men may shave face and neck. Do not bring valuables to the hospital. Do not wear nail polish, gel polish, artificial nails, or any other type of covering on natural nails (fingers and toes) If you have artificial nails or gel coating that need to be  removed by a nail salon, please have this removed prior to surgery. Artificial nails or gel coating may interfere with anesthesia's ability to adequately monitor your vital signs.  [facility] is not responsible for any belongings or valuables.    Do NOT Smoke (Tobacco/Vaping)  24 hours prior to your procedure  If you use a CPAP at night, you may bring your mask for your overnight stay.   Contacts, glasses, hearing aids, dentures or partials may not be worn into surgery, please bring cases for these belongings   For patients admitted to the hospital, discharge time will be determined by your treatment team.   Patients discharged the day of surgery will not be allowed to drive home, and someone needs to stay with them for 24 hours.   SURGICAL WAITING ROOM VISITATION Patients having surgery or a procedure may have no more than 2 support people in the waiting area - these visitors may rotate.   Children under the age of 34 must have an adult with them who is not the patient. If the patient needs to stay at the hospital during part of their recovery, the visitor guidelines for inpatient rooms apply. Pre-op nurse will coordinate an appropriate time for 1 support person to accompany patient in pre-op.  This support person may not rotate.   Please refer to [email or web address] for the visitor guidelines for Inpatients (after your surgery is over and you are in a regular room).    Special instructions:    Oral Hygiene is also important to reduce your risk of infection.  Remember - BRUSH YOUR TEETH THE MORNING OF SURGERY WITH YOUR REGULAR TOOTHPASTE   [place]- Preparing For Surgery  Before surgery, you can play an important role. Because skin is not sterile, your skin needs to be as free of germs as possible. You can reduce the number of germs on your skin by washing with CHG (chlorahexidine gluconate) Soap before surgery.  CHG is an  antiseptic cleaner which kills germs and bonds with the skin to continue killing germs even after washing.     Please do not use if you have an allergy to CHG or antibacterial soaps. If your skin becomes reddened/irritated stop using the CHG.  Do not shave (including legs and underarms) for at least 48 hours prior to first CHG shower. It is OK to shave your face.  Please follow these instructions carefully.     Shower the NIGHT BEFORE SURGERY and the MORNING OF SURGERY with CHG Soap.   If you chose to wash your hair, wash your hair first as usual with your normal shampoo. After you shampoo, rinse your hair and body thoroughly to remove the shampoo.  Then [employer] and genitals (private parts) with your normal soap and rinse thoroughly to remove soap.  After that Use CHG Soap as you would any other liquid soap. You can apply CHG directly to the skin and wash gently with a scrungie or a clean washcloth.   Apply the CHG Soap to your body ONLY FROM THE NECK DOWN.  Do not use on  open wounds or open sores. Avoid contact with your eyes, ears, mouth and genitals (private parts). Wash Face and genitals (private parts)  with your normal soap.   Wash thoroughly, paying special attention to the area where your surgery will be performed.  Thoroughly rinse your body with warm water from the neck down.  DO NOT shower/wash with your normal soap after using and rinsing off the CHG Soap.  Pat yourself dry with a CLEAN TOWEL.  Wear CLEAN PAJAMAS to bed the night before surgery  Place CLEAN SHEETS on your bed the night before your surgery  DO NOT SLEEP WITH PETS.   Day of Surgery: Take a shower with CHG soap. Wear Clean/Comfortable clothing the morning of surgery Do not apply any deodorants/lotions.   Remember to brush your teeth WITH YOUR REGULAR TOOTHPASTE.    If you received a COVID test during your pre-op visit, it is requested that you wear a mask when out in public, stay away from anyone  that may not be feeling well, and notify your surgeon if you develop symptoms. If you have been in contact with anyone that has tested positive in the last 10 days, please notify your surgeon.    Please read over the following fact sheets that you were given.

## 2022-10-03 ENCOUNTER — Encounter (HOSPITAL_COMMUNITY): Payer: Self-pay

## 2022-10-03 ENCOUNTER — Other Ambulatory Visit: Payer: Self-pay

## 2022-10-03 ENCOUNTER — Encounter (HOSPITAL_COMMUNITY)
Admission: RE | Admit: 2022-10-03 | Discharge: 2022-10-03 | Disposition: A | Discharge status: Home / Self Care | Payer: BC Managed Care – PPO | Source: Ambulatory Visit | Attending: Neurosurgery | Admitting: Neurosurgery

## 2022-10-03 VITALS — BP 153/93 | HR 104 | Temp 98.2°F | Resp 18 | Ht 66.0 in | Wt 278.7 lb

## 2022-10-03 DIAGNOSIS — Z01812 Encounter for preprocedural laboratory examination: Secondary | ICD-10-CM | POA: Insufficient documentation

## 2022-10-03 DIAGNOSIS — Z01818 Encounter for other preprocedural examination: Secondary | ICD-10-CM

## 2022-10-03 DIAGNOSIS — E119 Type 2 diabetes mellitus without complications: Secondary | ICD-10-CM | POA: Insufficient documentation

## 2022-10-03 LAB — TYPE AND SCREEN
ABO/RH(D): A POS
Antibody Screen: NEGATIVE

## 2022-10-03 LAB — BASIC METABOLIC PANEL
Anion gap: 13 (ref 5–15)
BUN: 20 mg/dL (ref 8–23)
CO2: 22 mmol/L (ref 22–32)
Calcium: 9.3 mg/dL (ref 8.9–10.3)
Chloride: 101 mmol/L (ref 98–111)
Creatinine, Ser: 0.9 mg/dL (ref 0.61–1.24)
GFR, Estimated: >60 mL/min (ref >60)
Glucose, Bld: 148 mg/dL — ABNORMAL HIGH (ref 70–99)
Potassium: 4 mmol/L (ref 3.5–5.1)
Sodium: 136 mmol/L (ref 135–145)

## 2022-10-03 LAB — CBC
HCT: 37.6 % — ABNORMAL LOW (ref 39.0–52.0)
Hemoglobin: 11.7 g/dL — ABNORMAL LOW (ref 13.0–17.0)
MCH: 20.3 pg — ABNORMAL LOW (ref 26.0–34.0)
MCHC: 31.1 g/dL (ref 30.0–36.0)
MCV: 65.3 fL — ABNORMAL LOW (ref 80.0–100.0)
Platelets: 169 10*3/uL (ref 150–400)
RBC: 5.76 MIL/uL (ref 4.22–5.81)
RDW: 17.2 % — ABNORMAL HIGH (ref 11.5–15.5)
WBC: 6.5 10*3/uL (ref 4.0–10.5)
nRBC: 0 % (ref 0.0–0.2)

## 2022-10-03 LAB — HEMOGLOBIN A1C
Hgb A1c MFr Bld: 6.9 % — ABNORMAL HIGH (ref 4.8–5.6)
Mean Plasma Glucose: 151.33 mg/dL

## 2022-10-03 LAB — GLUCOSE, CAPILLARY: Glucose-Capillary: 158 mg/dL — ABNORMAL HIGH (ref 70–99)

## 2022-10-03 LAB — SURGICAL PCR SCREEN
MRSA, PCR: POSITIVE — AB
Staphylococcus aureus: POSITIVE — AB

## 2022-10-03 NOTE — Progress Notes (Signed)
PCP - Miguel Aschoff Cardiologist - Serafina Royals with [facility]  PPM/ICD - denies   Chest x-ray - n/a EKG - 01/31/22 Stress Test - 12/26/18-CE ECHO - 12/26/18-CE Cardiac Cath - n/a  Sleep Study - +OSA CPAP - wears nightly, settings 16  Fasting Blood Sugar - 160-170 Checks Blood Sugar 1-2 times a day   As of today, STOP taking any Aspirin (unless otherwise instructed by your surgeon) Aleve, Naproxen, Ibuprofen, Motrin, Advil, Goody's, BC's, all herbal medications, fish oil, meloxicam (MOBIC) and all vitamins.  ERAS Protcol -no PRE-SURGERY Ensure or G2- not ordered  COVID TEST- not needed   Anesthesia review: yes, cardiac tests need to be reviewed. Cardiac clearance 03/28/22 with Nehemiah Massed. Pt states he has put off surgery for months but cardiology was aware at this visit of future back surgery.   Patient denies shortness of breath, fever, cough and chest pain at PAT appointment   All instructions explained to the patient, with a verbal understanding of the material. Patient agrees to go over the instructions while at home for a better understanding. Patient also instructed to self quarantine after being tested for COVID-19. The opportunity to ask questions was provided.

## 2022-10-04 ENCOUNTER — Ambulatory Visit: Payer: BC Managed Care – PPO | Admitting: Family Medicine

## 2022-10-04 ENCOUNTER — Ambulatory Visit: Payer: BC Managed Care – PPO | Admitting: Podiatry

## 2022-10-04 DIAGNOSIS — L600 Ingrowing nail: Secondary | ICD-10-CM | POA: Diagnosis not present

## 2022-10-04 DIAGNOSIS — Z79899 Other long term (current) drug therapy: Secondary | ICD-10-CM | POA: Diagnosis not present

## 2022-10-04 DIAGNOSIS — B351 Tinea unguium: Secondary | ICD-10-CM | POA: Diagnosis not present

## 2022-10-04 NOTE — Progress Notes (Signed)
Subjective:  Patient ID: Justin Soto, male    DOB: 1961-12-06,  MRN: [id number]  Chief Complaint  Patient presents with   Nail Problem    61 y.o. male presents with the above complaint.  Patient presents with complaint of left hallux lateral border ingrown painful to touch is progressive gotten worse worse with ambulation hurts with pressure.  He would like to have it removed.  He had a removed in the past by Dr. Amalia Hailey on the other side.  He is a diabetic but controlled.  He also has complaint of bilateral thickened elongated dystrophic toenails x2 to the hallux he would like to discuss treatment options for liver function test.   Review of Systems: Negative except as noted in the HPI. Denies N/V/F/Ch.  Past Medical History:  Diagnosis Date   Anemia    Arthritis    Diabetes mellitus without complication (HCC)    diet controlled   Dyspnea    Enlarged prostate    hx of   Environmental and seasonal allergies    Frequency of urination    GERD (gastroesophageal reflux disease)    History of kidney stones    HOH (hard of hearing)    wears bilateral hearing aids   Hypertension    Irregular heart beat    Pt feels this every once in a while; PCP aware but stated it was OK   Kidney stone on right side 01/15/2017   Leg pain, right    Take Lyrica and Naproxen   MRSA (methicillin resistant staph aureus) culture positive    Neck pain of over 3 months duration    Prostate cancer ([place])    Sleep apnea    cpap-15 setting   Thalassemia    Tick fever     Current Outpatient Medications:    albuterol (VENTOLIN HFA) 108 (90 Base) MCG/ACT inhaler, Inhale 2 puffs into the lungs every 6 (six) hours as needed for wheezing or shortness of breath. (Patient not taking: Reported on 09/28/2022), Disp: 8 g, Rfl: 0   amLODipine-benazepril (LOTREL) 10-40 MG capsule, TAKE 1 CAPSULE BY MOUTH EVERY DAY, Disp: 90 capsule, Rfl: 0   aspirin EC 81 MG tablet, Take 81 mg by mouth daily. , Disp: , Rfl:     atorvastatin (LIPITOR) 40 MG tablet, TAKE 1 TABLET BY MOUTH EVERY DAY, Disp: 90 tablet, Rfl: 3   cetirizine (ZYRTEC) 10 MG tablet, TAKE ONE TO TWO TABLETS BY MOUTH EVERY DAY AS NEEDED. (Patient taking differently: Take 10 mg by mouth daily.), Disp: 180 tablet, Rfl: 4   chlorpheniramine-HYDROcodone (TUSSIONEX PENNKINETIC ER) 10-8 MG/5ML SUER, Take 5 mLs by mouth every 12 (twelve) hours as needed for cough. (Patient not taking: Reported on 09/28/2022), Disp: 115 mL, Rfl: 0   cyclobenzaprine (FLEXERIL) 10 MG tablet, Take 1 tablet (10 mg total) by mouth at bedtime., Disp: 50 tablet, Rfl: 1   FARXIGA 10 MG TABS tablet, TAKE 1 TABLET BY MOUTH EVERY DAY BEFORE BREAKFAST, Disp: 90 tablet, Rfl: 0   finasteride (PROSCAR) 5 MG tablet, TAKE 1 TABLET (5 MG TOTAL) BY MOUTH DAILY., Disp: 90 tablet, Rfl: 3   fluticasone (FLONASE) 50 MCG/ACT nasal spray, USE TWO SPRAYS IN EACH NOSTRIL EVERY DAY, Disp: 48 mL, Rfl: 4   gabapentin (NEURONTIN) 300 MG capsule, Take 600 mg by mouth 2 (two) times daily., Disp: , Rfl:    hydrochlorothiazide (HYDRODIURIL) 25 MG tablet, TAKE 1 TABLET BY MOUTH EVERY DAY, Disp: 90 tablet, Rfl: 0   meloxicam (MOBIC) 15  MG tablet, Take 15 mg by mouth daily., Disp: , Rfl:    metoprolol tartrate (LOPRESSOR) 25 MG tablet, TAKE 1 TABLET (25 MG TOTAL) BY MOUTH DAILY., Disp: 90 tablet, Rfl: 3   montelukast (SINGULAIR) 10 MG tablet, TAKE 1 TABLET BY MOUTH EVERY DAY IN THE MORNING, Disp: 90 tablet, Rfl: 0   Multiple Vitamin (MULTIVITAMIN WITH MINERALS) TABS tablet, Take 1 tablet by mouth daily., Disp: , Rfl:    NON FORMULARY, Pt uses a cpap nightly, Disp: , Rfl:    omeprazole (PRILOSEC) 20 MG capsule, Take 20 mg by mouth daily. , Disp: , Rfl:    oxyCODONE-acetaminophen (PERCOCET) 10-325 MG tablet, Take 1 tablet by mouth every 4 (four) hours as needed for pain., Disp: 5 tablet, Rfl: 0   sertraline (ZOLOFT) 50 MG tablet, TAKE 1 TABLET BY MOUTH EVERY DAY, Disp: 90 tablet, Rfl: 2   tamsulosin (FLOMAX) 0.4 MG  CAPS capsule, TAKE 2 CAPSULES BY MOUTH EVERY DAY, Disp: 180 capsule, Rfl: 3  Social History   Tobacco Use  Smoking Status Never   Passive exposure: Never  Smokeless Tobacco Never    Allergies  Allergen Reactions   Methocarbamol Other (See Comments)    Numbness, and change of taste    Tizanidine Nausea Only   Vicodin [Hydrocodone-Acetaminophen] Itching    Can take with benadryl    Objective:  There were no vitals filed for this visit. There is no height or weight on file to calculate BMI. Constitutional Well developed. Well nourished.  Vascular Dorsalis pedis pulses palpable bilaterally. Posterior tibial pulses palpable bilaterally. Capillary refill normal to all digits.  No cyanosis or clubbing noted. Pedal hair growth normal.  Neurologic Normal speech. Oriented to person, place, and time. Epicritic sensation to light touch grossly present bilaterally.  Dermatologic Painful ingrowing nail at lateral nail borders of the hallux nail bilaterally. No other open wounds. No skin lesions.  Orthopedic: Normal joint ROM without pain or crepitus bilaterally. No visible deformities. No bony tenderness.   Radiographs: None Assessment:   1. Long-term use of high-risk medication   2. Nail fungus   3. Onychomycosis due to dermatophyte   4. Ingrown left big toenail    Plan:  Patient was evaluated and treated and all questions answered.  Onychomycosis bilateral hallux -Educated the patient on the etiology of onychomycosis and various treatment options associated with improving the fungal load.  I explained to the patient that there is 3 treatment options available to treat the onychomycosis including topical, p.o., laser treatment.  Patient elected to undergo p.o. options with Lamisil/terbinafine therapy.  In order for me to start the medication therapy, I explained to the patient the importance of evaluating the liver and obtaining the liver function test.  Once the liver function  test comes back normal I will start him on 27-monthcourse of Lamisil therapy.  Patient understood all risk and would like to proceed with Lamisil therapy.  I have asked the patient to immediately stop the Lamisil therapy if she has any reactions to it and call the office or go to the emergency room right away.  Patient states understanding   Ingrown Nail, bilaterally -Patient elects to proceed with minor surgery to remove ingrown toenail removal today. Consent reviewed and signed by patient. -Ingrown nail excised. See procedure note. -Educated on post-procedure care including soaking. Written instructions provided and reviewed. -Patient to follow up in 2 weeks for nail check.  Procedure: Excision of Ingrown Toenail Location: Bilateral 1st toe lateral nail  borders. Anesthesia: Lidocaine 1% plain; 1.5 mL and Marcaine 0.5% plain; 1.5 mL, digital block. Skin Prep: Betadine. Dressing: Silvadene; telfa; dry, sterile, compression dressing. Technique: Following skin prep, the toe was exsanguinated and a tourniquet was secured at the base of the toe. The affected nail border was freed, split with a nail splitter, and excised. Chemical matrixectomy was then performed with phenol and irrigated out with alcohol. The tourniquet was then removed and sterile dressing applied. Disposition: Patient tolerated procedure well. Patient to return in 2 weeks for follow-up.   No follow-ups on file.

## 2022-10-04 NOTE — Anesthesia Preprocedure Evaluation (Addendum)
Anesthesia Evaluation  Patient identified by MRN, date of birth, ID band Patient awake    Reviewed: Allergy & Precautions, NPO status , Patient's Chart, lab work & pertinent test results  Airway Mallampati: II  TM Distance: >3 FB Neck ROM: Full    Dental  (+) Dental Advisory Given   Pulmonary sleep apnea and Continuous Positive Airway Pressure Ventilation ,    breath sounds clear to auscultation       Cardiovascular hypertension, Pt. on medications and Pt. on home beta blockers  Rhythm:Regular Rate:Normal     Neuro/Psych  Neuromuscular disease    GI/Hepatic Neg liver ROS, GERD  ,  Endo/Other  diabetes, Type 2, Oral Hypoglycemic AgentsMorbid obesity  Renal/GU Renal disease     Musculoskeletal  (+) Arthritis ,   Abdominal   Peds  Hematology  (+) Blood dyscrasia, anemia ,   Anesthesia Other Findings   Reproductive/Obstetrics                            Anesthesia Physical Anesthesia Plan  ASA: 3  Anesthesia Plan: General   Post-op Pain Management: Tylenol PO (pre-op)* and Toradol IV (intra-op)*   Induction: Intravenous  PONV Risk Score and Plan: 2 and Dexamethasone, Ondansetron and Treatment may vary due to age or medical condition  Airway Management Planned: Oral ETT  Additional Equipment: None  Intra-op Plan:   Post-operative Plan: Extubation in OR  Informed Consent: I have reviewed the patients History and Physical, chart, labs and discussed the procedure including the risks, benefits and alternatives for the proposed anesthesia with the patient or authorized representative who has indicated his/her understanding and acceptance.     Dental advisory given  Plan Discussed with: CRNA  Anesthesia Plan Comments: ( )       Anesthesia Quick Evaluation

## 2022-10-04 NOTE — Progress Notes (Deleted)
Established patient visit   Patient: Justin Soto   DOB: 02-12-1961   61 y.o. Male  MRN: [id number] Visit Date: 10/04/2022  Today's healthcare provider: Eulis Foster, MD   No chief complaint on file.  Subjective    HPI   Diabetes Mellitus Type II, Follow-up  Lab Results  Component Value Date   HGBA1C 6.9 (H) 10/03/2022   HGBA1C 6.6 (H) 04/04/2022   HGBA1C 6.7 (A) 10/11/2021   Wt Readings from Last 3 Encounters:  10/03/22 278 lb 11.2 oz (126.4 kg)  07/15/22 280 lb (127 kg)  04/04/22 284 lb (128.8 kg)   Last seen for diabetes {1-12:18279} {days/wks/mos/yrs:310907} ago.  Management since then includes ***. He reports {excellent/good/fair/poor:19665} compliance with treatment. He {is/is not:21021397} having side effects. {document side effects if present:1} Symptoms: {Yes/No:20286} fatigue {Yes/No:20286} foot ulcerations  {Yes/No:20286} appetite changes {Yes/No:20286} nausea  {Yes/No:20286} paresthesia of the feet  {Yes/No:20286} polydipsia  {Yes/No:20286} polyuria {Yes/No:20286} visual disturbances   {Yes/No:20286} vomiting     Home blood sugar records: {diabetes glucometry results:16657}  Episodes of hypoglycemia? {Yes/No:20286} {enter symptoms and frequency of symptoms if yes:1}   Current insulin regiment: {enter 'none' or type of insulin and number of units taken with each dose of each insulin formulation that the patient is taking:1} Most Recent Eye Exam: *** {Current exercise:16438:::1} {Current diet habits:16563:::1}  Pertinent Labs: Lab Results  Component Value Date   CHOL 117 01/20/2021   HDL 30 (L) 01/20/2021   LDLCALC 62 01/20/2021   TRIG 142 01/20/2021   Lab Results  Component Value Date   NA 136 10/03/2022   K 4.0 10/03/2022   CREATININE 0.90 10/03/2022   GFRNONAA >60 10/03/2022   LABMICR See below: 11/23/2017      --------------------------------------------------------------------------------------------------- Hypertension, follow-up  BP Readings from Last 3 Encounters:  10/03/22 (!) 153/93  07/15/22 139/81  04/04/22 124/76   Wt Readings from Last 3 Encounters:  10/03/22 278 lb 11.2 oz (126.4 kg)  07/15/22 280 lb (127 kg)  04/04/22 284 lb (128.8 kg)     He was last seen for hypertension {NUMBERS 1-12:18279} {days/wks/mos/yrs:310907} ago.  BP at that visit was ***. Management since that visit includes ***.  He reports {excellent/good/fair/poor:19665} compliance with treatment. He {is/is not:9024} having side effects. {document side effects if present:1} He is following a {diet:21022986} diet. He {is/is not:9024} exercising. He {does/does not:200015} smoke.  Use of agents associated with hypertension: {bp agents assoc with hypertension:511::"none"}.   Outside blood pressures are {***enter patient reported home BP readings, or 'not being checked':1}. Symptoms: {Yes/No:20286} chest pain {Yes/No:20286} chest pressure  {Yes/No:20286} palpitations {Yes/No:20286} syncope  {Yes/No:20286} dyspnea {Yes/No:20286} orthopnea  {Yes/No:20286} paroxysmal nocturnal dyspnea {Yes/No:20286} lower extremity edema   Pertinent labs Lab Results  Component Value Date   CHOL 117 01/20/2021   HDL 30 (L) 01/20/2021   LDLCALC 62 01/20/2021   TRIG 142 01/20/2021   Lab Results  Component Value Date   NA 136 10/03/2022   K 4.0 10/03/2022   CREATININE 0.90 10/03/2022   GFRNONAA >60 10/03/2022   GLUCOSE 148 (H) 10/03/2022   TSH 1.160 04/04/2022     The ASCVD Risk score (Arnett DK, et al., 2019) failed to calculate for the following reasons:   The valid total cholesterol range is 130 to 320 mg/dL  ---------------------------------------------------------------------------------------------------   Medications: Outpatient Medications Prior to Visit  Medication Sig  . albuterol (VENTOLIN HFA) 108 (90  Base) MCG/ACT inhaler Inhale 2 puffs into the lungs every 6 (six) hours as  needed for wheezing or shortness of breath. (Patient not taking: Reported on 09/28/2022)  . amLODipine-benazepril (LOTREL) 10-40 MG capsule TAKE 1 CAPSULE BY MOUTH EVERY DAY  . aspirin EC 81 MG tablet Take 81 mg by mouth daily.   Marland Kitchen atorvastatin (LIPITOR) 40 MG tablet TAKE 1 TABLET BY MOUTH EVERY DAY  . cetirizine (ZYRTEC) 10 MG tablet TAKE ONE TO TWO TABLETS BY MOUTH EVERY DAY AS NEEDED. (Patient taking differently: Take 10 mg by mouth daily.)  . chlorpheniramine-HYDROcodone (TUSSIONEX PENNKINETIC ER) 10-8 MG/5ML SUER Take 5 mLs by mouth every 12 (twelve) hours as needed for cough. (Patient not taking: Reported on 09/28/2022)  . cyclobenzaprine (FLEXERIL) 10 MG tablet Take 1 tablet (10 mg total) by mouth at bedtime.  Marland Kitchen FARXIGA 10 MG TABS tablet TAKE 1 TABLET BY MOUTH EVERY DAY BEFORE BREAKFAST  . finasteride (PROSCAR) 5 MG tablet TAKE 1 TABLET (5 MG TOTAL) BY MOUTH DAILY.  . fluticasone (FLONASE) 50 MCG/ACT nasal spray USE TWO SPRAYS IN EACH NOSTRIL EVERY DAY  . gabapentin (NEURONTIN) 300 MG capsule Take 600 mg by mouth 2 (two) times daily.  . hydrochlorothiazide (HYDRODIURIL) 25 MG tablet TAKE 1 TABLET BY MOUTH EVERY DAY  . meloxicam (MOBIC) 15 MG tablet Take 15 mg by mouth daily.  . metoprolol tartrate (LOPRESSOR) 25 MG tablet TAKE 1 TABLET (25 MG TOTAL) BY MOUTH DAILY.  . montelukast (SINGULAIR) 10 MG tablet TAKE 1 TABLET BY MOUTH EVERY DAY IN THE MORNING  . Multiple Vitamin (MULTIVITAMIN WITH MINERALS) TABS tablet Take 1 tablet by mouth daily.  . NON FORMULARY Pt uses a cpap nightly  . omeprazole (PRILOSEC) 20 MG capsule Take 20 mg by mouth daily.   Marland Kitchen oxyCODONE-acetaminophen (PERCOCET) 10-325 MG tablet Take 1 tablet by mouth every 4 (four) hours as needed for pain.  Marland Kitchen sertraline (ZOLOFT) 50 MG tablet TAKE 1 TABLET BY MOUTH EVERY DAY  . tamsulosin (FLOMAX) 0.4 MG CAPS capsule TAKE 2 CAPSULES BY MOUTH EVERY DAY   No  facility-administered medications prior to visit.    Review of Systems  {Labs  Heme  Chem  Endocrine  Serology  Results Review (optional):23779}   Objective    There were no vitals taken for this visit. {Show previous vital signs (optional):23777}  Physical Exam  ***  No results found for any visits on 10/04/22.  Assessment & Plan     Problem List Items Addressed This Visit       Cardiovascular and Mediastinum   BP (high blood pressure)     Endocrine   Type 2 diabetes mellitus without complication, without long-term current use of insulin (HCC) - Primary     Other   Morbid obesity with BMI of 40.0-44.9, adult ([place])     No follow-ups on file.      I, Eulis Foster, MD, have reviewed all documentation for this visit. The documentation on 10/04/22 for the exam, diagnosis, procedures, and orders are all accurate and complete.  Portions of this information were initially documented by the CMA and reviewed by me for thoroughness and accuracy.      Eulis Foster, MD  [facility] [phone] (phone) [phone] (fax)  [place]

## 2022-10-04 NOTE — Progress Notes (Signed)
Anesthesia Chart Review:  Case: [id number] Date/Time: 10/12/22 0815   Procedure: PLIF,IP,POSTERIOR INSTRUMENTATION L5S1; EXTEND FUSION TO THE ILIUM   Anesthesia type: General   Pre-op diagnosis: SPONDYLOLISTHESIS, LUMBOSACRAL REGION   Location: [facility] OR ROOM 5 / [facility]   Surgeons: Newman Pies, MD       DISCUSSION: Patient is a 61 year old male scheduled for the above procedure.  History includes never smoker, HTN, DM2, OSA (uses CPAP @ 16), BPH, prostate cancer (T1c low risk, biopsy 12/09/20, elected for active surveillance), nephrolithiasis, hard of hearing (uses hearing aids), GERD, thalassemia/mild anemia, spinal surgery (C3-4, C6-7 ACDF 11/13/14; redo L3-5 laminotomy/foraminotomies; PLIF 10/23/19 with I&D lumbar wound 11/28/19), left arytenoid mass (s/p microlaryngoscopy with mass excision 11/24/20, pathology: benign squamous papilloma). BMI is consistent with morbid obesity.  Last cardiology follow-up with Dr. Nehemiah Massed was on 03/28/22 for HTN , HLD, and OSA. He was fully compliant with CPAP. Continue current BP medications. Continue statin for HLD. No significant flow limiting CAD by CCTA FFR in 2020. In regards to surgical risk, he wrote, "Proceed to surgery and/or invasive procedure without restriction to pre or post operative and/or procedural care. The patient is at lowest risk possible for cardiovascular complications with surgical intervention and/or invasive procedure. Currently has no evidence active and/or significant angina and/or congestive heart failure. The patient may discontinue aspirin 7 days prior to procedure and restart at a safe period thereafter". One year follow-up planned.   Advised at PAT to hold [place] for 72 hours prior to surgery.   Anesthesia team to evaluate on the day of surgery.    VS: BP (!) 153/93   Pulse (!) 104   Temp 36.8 C (Oral)   Resp 18   Ht '5\' 6"'$  (1.676 m)   Wt 126.4 kg   SpO2 96%   BMI 44.98 kg/m   PROVIDERS: Jerrol Banana., MD is  PCP  Serafina Royals, MD is cardiologist Jefm Bryant, [facility]) John Giovanni, MD is urologist. Last visit 07/15/22.    LABS: Labs reviewed: Acceptable for surgery. (all labs ordered are listed, but only abnormal results are displayed)  Labs Reviewed  SURGICAL PCR SCREEN - Abnormal; Notable for the following components:      Result Value   MRSA, PCR POSITIVE (*)    Staphylococcus aureus POSITIVE (*)    All other components within normal limits  GLUCOSE, CAPILLARY - Abnormal; Notable for the following components:   Glucose-Capillary 158 (*)    All other components within normal limits  CBC - Abnormal; Notable for the following components:   Hemoglobin 11.7 (*)    HCT 37.6 (*)    MCV 65.3 (*)    MCH 20.3 (*)    RDW 17.2 (*)    All other components within normal limits  BASIC METABOLIC PANEL - Abnormal; Notable for the following components:   Glucose, Bld 148 (*)    All other components within normal limits  HEMOGLOBIN A1C - Abnormal; Notable for the following components:   Hgb A1c MFr Bld 6.9 (*)    All other components within normal limits  TYPE AND SCREEN     IMAGES: MRI L-spine 05/03/22: IMPRESSION: 1. Degenerative changes of the lumbar spine, not significantly changed since prior MRI. 2. Severe hypertrophic arthropathy at L5-S1 with severe bilateral neural foraminal narrowing. 3. Moderate spinal canal stenosis at L1-2 and severe at L2-3.   CT Renal Stone 03/21/22: IMPRESSION: 1. No acute findings in the abdomen or pelvis. 2. Bilateral nonobstructing renal stones. 3. Diverticulosis  with no diverticulitis. 4.  Aortic Atherosclerosis (ICD10-I70.0).   CXR 01/31/22: FINDINGS: The heart size and mediastinal contours are within normal limits. Both lungs are clear. Radiopaque fusion plates and screws are seen overlying the cervical spine. The visualized skeletal structures are unremarkable. IMPRESSION: No active cardiopulmonary disease.   MRI/MRV Brain 01/31/22 (to evaluate  for CVA, treated as complicated migraine per neurology): IMPRESSION: 1. Unremarkable appearance of the brain. 2. Negative head MRV.   EKG: 01/31/22: NSR   CV: CT Coronary with FFR 06/28/19 ([place]): CCTA Coronary Arteries Findings:  - Coronary Arteries: There is right dominance of the coronary arterial circulation.  The coronary artery ostia are in normal location.  - Left Main: Normal course and size. No stenosis. - LAD: Normal course and size. Moderate moderate calcified plaque with multifocal areas of stenosis including approximately 50% stenosis in the mid LAD.  - Diagonals: Normal course. Diminutive D1. Mild calcifications in D2 with less than 50% stenosis.  - Left Circumflex: Normal course. Normal size proximally with small distal segment. Minimal calcified plaque with < 50% stenosis. - Obtuse Marginals: Normal course and size. Mild calcified plaque with up to 50% stenosis in OM1.  - Right Coronary Artery: Normal course and size. Mild mixed calcified plaque. Approximately 50% in the proximal RCA. Additional focus of 30-50% stenosis more distally.    CCTA Impression: 1. Moderate three-vessel coronary artery calcifications with approximately 30-50% focal stenosis in the proximal RCA, mid LAD, and proximal OM1. CAD-RADS 3.  2. Normal heart size and function. Normal ejection fraction. Mild circumferential left ventricular hypertrophy.  3. Tiny pulmonary nodules. Consider follow-up CT in 12 months if patient is high risk for lung malignancy.  4. CTA is being sent for CT-FFR analysis which will be reported under a separate report.   FFR Findings:  Left Main Artery: Normal FFR of greater than 0.8 Left Anterior Descending System: Normal FFR throughout the vessel. FFR equals 0.84 at the distal most aspect. Left Circumflex System: Normal FFR throughout the vessel. FFR equals 0.9 at the distal aspect of the vessel Right Coronary Artery system: Normal FFR throughout the vessel.  FFR equals 0.93 of the distal aspect.   FFR Impression:  No CT FFR evidence for flow-limiting stenosis.    Stress echo 12/26/2018 ([place]): INTERPRETATION Normal Stress Echocardiogram. EF > 55%.    Echo 11/30/16 ([place]): INTERPRETATION NORMAL LEFT VENTRICULAR SYSTOLIC FUNCTION   WITH MILD LVH NORMAL RIGHT VENTRICULAR SYSTOLIC FUNCTION TRIVIAL MR AND TR NO VALVULAR STENOSIS EF 55-60%     Past Medical History:  Diagnosis Date   Anemia    Arthritis    Diabetes mellitus without complication (HCC)    diet controlled   Dyspnea    Enlarged prostate    hx of   Environmental and seasonal allergies    Frequency of urination    GERD (gastroesophageal reflux disease)    History of kidney stones    HOH (hard of hearing)    wears bilateral hearing aids   Hypertension    Irregular heart beat    Pt feels this every once in a while; PCP aware but stated it was OK   Kidney stone on right side 01/15/2017   Leg pain, right    Take Lyrica and Naproxen   MRSA (methicillin resistant staph aureus) culture positive    Neck pain of over 3 months duration    Prostate cancer ([place])    Sleep apnea    cpap-15 setting  Thalassemia    Tick fever     Past Surgical History:  Procedure Laterality Date   ANTERIOR CERVICAL DECOMP/DISCECTOMY FUSION N/A 11/13/2014   Procedure: Cervical three-four, Cervical six-seven anterior cervical decompression with fusion interbody prosthesis plating and bonegraft;  Surgeon: Newman Pies, MD;  Location: [facility];  Service: Neurosurgery;  Laterality: N/A;  Cervical three-four, Cervical six-seven anterior cervical decompression with fusion interbody prosthesis plating and bonegraft   BACK SURGERY     Lumbar X 2   CALCANEAL OSTEOTOMY Right 01/11/2017   Procedure: CALCANEAL OSTEOTOMY;  Surgeon: Earnestine Leys, MD;  Location: [facility];  Service: Orthopedics;  Laterality: Right;   CIRCUMCISION N/A 12/13/2017   Procedure: PENILE BLOCK, LYSIS OF  PENILE ADHESIONS;  Surgeon: Hollice Espy, MD;  Location: [facility];  Service: Urology;  Laterality: N/A;   COLONOSCOPY     COLONOSCOPY WITH PROPOFOL N/A 09/15/2021   Procedure: COLONOSCOPY WITH PROPOFOL;  Surgeon: Toledo, Benay Pike, MD;  Location: [facility];  Service: Gastroenterology;  Laterality: N/A;  DM   CYSTOSCOPY N/A 12/13/2017   Procedure: CYSTOSCOPY;  Surgeon: Hollice Espy, MD;  Location: [facility];  Service: Urology;  Laterality: N/A;   DIRECT LARYNGOSCOPY N/A 11/24/2020   Procedure: MICRO DIRECT LARYNGOSCOPY W/BIOPSY;  Surgeon: Beverly Gust, MD;  Location: [facility];  Service: ENT;  Laterality: N/A;   HERNIA REPAIR Left    groin   LEG SURGERY Right    Had surgery on right calf X 7   LITHOTRIPSY     LUMBAR WOUND DEBRIDEMENT N/A 11/28/2019   Procedure: LUMBAR WOUND DEBRIDEMENT;  Surgeon: Newman Pies, MD;  Location: [facility];  Service: Neurosurgery;  Laterality: N/A;  LUMBAR WOUND DEBRIDEMENT   QUADRICEPS TENDON REPAIR Left 06/22/2015   Procedure: REPAIR QUADRICEP TENDON;  Surgeon: Earnestine Leys, MD;  Location: [facility];  Service: Orthopedics;  Laterality: Left;   QUADRICEPS TENDON REPAIR Left 08/12/2015   Procedure: REPAIR QUADRICEP TENDON;  Surgeon: Earnestine Leys, MD;  Location: [facility];  Service: Orthopedics;  Laterality: Left;   ROTATOR CUFF REPAIR Left    TONSILLECTOMY     VASECTOMY     with hydrocele repaired    MEDICATIONS:  albuterol (VENTOLIN HFA) 108 (90 Base) MCG/ACT inhaler   amLODipine-benazepril (LOTREL) 10-40 MG capsule   aspirin EC 81 MG tablet   atorvastatin (LIPITOR) 40 MG tablet   cetirizine (ZYRTEC) 10 MG tablet   chlorpheniramine-HYDROcodone (TUSSIONEX PENNKINETIC ER) 10-8 MG/5ML SUER   cyclobenzaprine (FLEXERIL) 10 MG tablet   FARXIGA 10 MG TABS tablet   finasteride (PROSCAR) 5 MG tablet   fluticasone (FLONASE) 50 MCG/ACT nasal spray   gabapentin (NEURONTIN) 300 MG capsule   hydrochlorothiazide (HYDRODIURIL) 25 MG tablet   meloxicam  (MOBIC) 15 MG tablet   metoprolol tartrate (LOPRESSOR) 25 MG tablet   montelukast (SINGULAIR) 10 MG tablet   Multiple Vitamin (MULTIVITAMIN WITH MINERALS) TABS tablet   NON FORMULARY   omeprazole (PRILOSEC) 20 MG capsule   oxyCODONE-acetaminophen (PERCOCET) 10-325 MG tablet   sertraline (ZOLOFT) 50 MG tablet   tamsulosin (FLOMAX) 0.4 MG CAPS capsule   No current facility-administered medications for this encounter.    Myra Gianotti, PA-C Surgical Short Stay/Anesthesiology [facility] Phone [phone] [facility] Phone [phone] 10/04/2022 5:34 PM

## 2022-10-05 LAB — HEPATIC FUNCTION PANEL
ALT: 37 IU/L (ref 0–44)
AST: 34 IU/L (ref 0–40)
Albumin: 4.2 g/dL (ref 3.9–4.9)
Alkaline Phosphatase: 70 IU/L (ref 44–121)
Bilirubin Total: 0.7 mg/dL (ref 0.0–1.2)
Bilirubin, Direct: 0.18 mg/dL (ref 0.00–0.40)
Total Protein: 7.7 g/dL (ref 6.0–8.5)

## 2022-10-05 MED ORDER — TERBINAFINE HCL 250 MG PO TABS: 250.0000 mg | ORAL_TABLET | Freq: Every day | ORAL | 0 refills | Status: DC

## 2022-10-05 NOTE — Addendum Note (Signed)
Addended by: Boneta Lucks on: 10/05/2022 08:05 AM   Modules accepted: Orders

## 2022-10-12 ENCOUNTER — Ambulatory Visit (HOSPITAL_COMMUNITY): Payer: BC Managed Care – PPO | Admitting: Vascular Surgery

## 2022-10-12 ENCOUNTER — Other Ambulatory Visit: Payer: Self-pay

## 2022-10-12 ENCOUNTER — Ambulatory Visit (HOSPITAL_COMMUNITY)
Admission: RE | Admit: 2022-10-12 | Discharge: 2022-10-13 | Disposition: A | Discharge status: Home / Self Care | Payer: BC Managed Care – PPO | Attending: Neurosurgery | Admitting: Neurosurgery

## 2022-10-12 ENCOUNTER — Ambulatory Visit (HOSPITAL_COMMUNITY): Payer: BC Managed Care – PPO | Admitting: Anesthesiology

## 2022-10-12 ENCOUNTER — Encounter (HOSPITAL_COMMUNITY)
Admission: RE | Disposition: A | Discharge status: Home / Self Care | Payer: Self-pay | Source: Home / Self Care | Attending: Neurosurgery

## 2022-10-12 ENCOUNTER — Ambulatory Visit (HOSPITAL_COMMUNITY): Payer: BC Managed Care – PPO

## 2022-10-12 ENCOUNTER — Encounter (HOSPITAL_COMMUNITY): Payer: Self-pay | Admitting: Neurosurgery

## 2022-10-12 DIAGNOSIS — M5459 Other low back pain: Secondary | ICD-10-CM | POA: Diagnosis not present

## 2022-10-12 DIAGNOSIS — G4730 Sleep apnea, unspecified: Secondary | ICD-10-CM | POA: Insufficient documentation

## 2022-10-12 DIAGNOSIS — I10 Essential (primary) hypertension: Secondary | ICD-10-CM | POA: Diagnosis not present

## 2022-10-12 DIAGNOSIS — M48062 Spinal stenosis, lumbar region with neurogenic claudication: Secondary | ICD-10-CM | POA: Diagnosis not present

## 2022-10-12 DIAGNOSIS — K219 Gastro-esophageal reflux disease without esophagitis: Secondary | ICD-10-CM | POA: Insufficient documentation

## 2022-10-12 DIAGNOSIS — G8929 Other chronic pain: Secondary | ICD-10-CM

## 2022-10-12 DIAGNOSIS — M4326 Fusion of spine, lumbar region: Secondary | ICD-10-CM | POA: Diagnosis not present

## 2022-10-12 DIAGNOSIS — E119 Type 2 diabetes mellitus without complications: Secondary | ICD-10-CM | POA: Insufficient documentation

## 2022-10-12 DIAGNOSIS — Z6841 Body Mass Index (BMI) 40.0 and over, adult: Secondary | ICD-10-CM | POA: Diagnosis not present

## 2022-10-12 DIAGNOSIS — M5117 Intervertebral disc disorders with radiculopathy, lumbosacral region: Secondary | ICD-10-CM | POA: Diagnosis not present

## 2022-10-12 DIAGNOSIS — M4727 Other spondylosis with radiculopathy, lumbosacral region: Secondary | ICD-10-CM | POA: Diagnosis not present

## 2022-10-12 DIAGNOSIS — M4807 Spinal stenosis, lumbosacral region: Secondary | ICD-10-CM | POA: Insufficient documentation

## 2022-10-12 DIAGNOSIS — M1990 Unspecified osteoarthritis, unspecified site: Secondary | ICD-10-CM | POA: Insufficient documentation

## 2022-10-12 DIAGNOSIS — Z7984 Long term (current) use of oral hypoglycemic drugs: Secondary | ICD-10-CM | POA: Insufficient documentation

## 2022-10-12 DIAGNOSIS — D649 Anemia, unspecified: Secondary | ICD-10-CM | POA: Insufficient documentation

## 2022-10-12 DIAGNOSIS — M4317 Spondylolisthesis, lumbosacral region: Secondary | ICD-10-CM | POA: Diagnosis not present

## 2022-10-12 LAB — GLUCOSE, CAPILLARY
Glucose-Capillary: 148 mg/dL — ABNORMAL HIGH (ref 70–99)
Glucose-Capillary: 209 mg/dL — ABNORMAL HIGH (ref 70–99)
Glucose-Capillary: 224 mg/dL — ABNORMAL HIGH (ref 70–99)

## 2022-10-12 SURGERY — POSTERIOR LUMBAR FUSION 1 LEVEL
Anesthesia: General | Site: Spine Lumbar

## 2022-10-12 MED ORDER — ONDANSETRON HCL 4 MG/2ML IJ SOLN
INTRAMUSCULAR | Status: AC
  Filled 2022-10-12: qty 2

## 2022-10-12 MED ORDER — SODIUM CHLORIDE 0.9 % IV SOLN
250.0000 mL | INTRAVENOUS | Status: DC
  Administered 2022-10-12: 250 mL via INTRAVENOUS

## 2022-10-12 MED ORDER — DAPAGLIFLOZIN PROPANEDIOL 10 MG PO TABS
10.0000 mg | ORAL_TABLET | Freq: Every day | ORAL | Status: DC
  Administered 2022-10-13: 10 mg via ORAL
  Filled 2022-10-12: qty 1

## 2022-10-12 MED ORDER — FINASTERIDE 5 MG PO TABS
5.0000 mg | ORAL_TABLET | Freq: Every day | ORAL | Status: DC
  Administered 2022-10-13: 5 mg via ORAL
  Filled 2022-10-12: qty 1

## 2022-10-12 MED ORDER — OXYCODONE-ACETAMINOPHEN 10-325 MG PO TABS: 1.0000 | ORAL_TABLET | ORAL | Status: DC | PRN

## 2022-10-12 MED ORDER — FENTANYL CITRATE (PF) 250 MCG/5ML IJ SOLN
INTRAMUSCULAR | Status: AC
  Filled 2022-10-12: qty 5

## 2022-10-12 MED ORDER — ALBUTEROL SULFATE (2.5 MG/3ML) 0.083% IN NEBU: 2.5000 mg | INHALATION_SOLUTION | Freq: Four times a day (QID) | RESPIRATORY_TRACT | Status: DC | PRN

## 2022-10-12 MED ORDER — ROCURONIUM BROMIDE 10 MG/ML (PF) SYRINGE
PREFILLED_SYRINGE | INTRAVENOUS | Status: DC | PRN
  Administered 2022-10-12: 50 mg via INTRAVENOUS
  Administered 2022-10-12: 60 mg via INTRAVENOUS
  Administered 2022-10-12: 40 mg via INTRAVENOUS
  Administered 2022-10-12: 50 mg via INTRAVENOUS

## 2022-10-12 MED ORDER — FENTANYL CITRATE (PF) 100 MCG/2ML IJ SOLN
25.0000 ug | INTRAMUSCULAR | Status: DC | PRN
  Administered 2022-10-12: 25 ug via INTRAVENOUS
  Administered 2022-10-12: 50 ug via INTRAVENOUS

## 2022-10-12 MED ORDER — ACETAMINOPHEN 325 MG PO TABS
650.0000 mg | ORAL_TABLET | Freq: Once | ORAL | Status: AC
  Administered 2022-10-12: 650 mg via ORAL
  Filled 2022-10-12: qty 2

## 2022-10-12 MED ORDER — CEFAZOLIN SODIUM-DEXTROSE 2-4 GM/100ML-% IV SOLN
2.0000 g | Freq: Three times a day (TID) | INTRAVENOUS | Status: AC
  Administered 2022-10-12 – 2022-10-13 (×2): 2 g via INTRAVENOUS
  Filled 2022-10-12 (×2): qty 100

## 2022-10-12 MED ORDER — CHLORHEXIDINE GLUCONATE 0.12 % MT SOLN
15.0000 mL | Freq: Once | OROMUCOSAL | Status: AC
  Administered 2022-10-12: 15 mL via OROMUCOSAL
  Filled 2022-10-12: qty 15

## 2022-10-12 MED ORDER — CHLORHEXIDINE GLUCONATE CLOTH 2 % EX PADS: 6.0000 | MEDICATED_PAD | Freq: Once | CUTANEOUS | Status: DC

## 2022-10-12 MED ORDER — PHENYLEPHRINE HCL (PRESSORS) 10 MG/ML IV SOLN
INTRAVENOUS | Status: AC
  Filled 2022-10-12: qty 1

## 2022-10-12 MED ORDER — 0.9 % SODIUM CHLORIDE (POUR BTL) OPTIME
TOPICAL | Status: DC | PRN
  Administered 2022-10-12: 1000 mL

## 2022-10-12 MED ORDER — AMLODIPINE BESYLATE 5 MG PO TABS
10.0000 mg | ORAL_TABLET | Freq: Every day | ORAL | Status: DC
  Administered 2022-10-13: 10 mg via ORAL
  Filled 2022-10-12: qty 2

## 2022-10-12 MED ORDER — MIDAZOLAM HCL 5 MG/5ML IJ SOLN
INTRAMUSCULAR | Status: DC | PRN
  Administered 2022-10-12: 2 mg via INTRAVENOUS

## 2022-10-12 MED ORDER — FLUTICASONE PROPIONATE 50 MCG/ACT NA SUSP
2.0000 | Freq: Every day | NASAL | Status: DC
  Administered 2022-10-13: 2 via NASAL
  Filled 2022-10-12: qty 16

## 2022-10-12 MED ORDER — ACETAMINOPHEN 500 MG PO TABS
1000.0000 mg | ORAL_TABLET | Freq: Four times a day (QID) | ORAL | Status: DC
  Administered 2022-10-12 – 2022-10-13 (×3): 1000 mg via ORAL
  Filled 2022-10-12 (×3): qty 2

## 2022-10-12 MED ORDER — OXYCODONE HCL 5 MG PO TABS: 5.0000 mg | ORAL_TABLET | Freq: Once | ORAL | Status: DC | PRN

## 2022-10-12 MED ORDER — BUPIVACAINE LIPOSOME 1.3 % IJ SUSP
INTRAMUSCULAR | Status: DC | PRN
  Administered 2022-10-12: 20 mL

## 2022-10-12 MED ORDER — PROPOFOL 10 MG/ML IV BOLUS
INTRAVENOUS | Status: AC
  Filled 2022-10-12: qty 20

## 2022-10-12 MED ORDER — EPHEDRINE SULFATE-NACL 50-0.9 MG/10ML-% IV SOSY
PREFILLED_SYRINGE | INTRAVENOUS | Status: DC | PRN
  Administered 2022-10-12 (×2): 5 mg via INTRAVENOUS

## 2022-10-12 MED ORDER — INSULIN ASPART 100 UNIT/ML IJ SOLN: 0.0000 [IU] | INTRAMUSCULAR | Status: DC | PRN

## 2022-10-12 MED ORDER — OXYCODONE HCL 5 MG/5ML PO SOLN: 5.0000 mg | Freq: Once | ORAL | Status: DC | PRN

## 2022-10-12 MED ORDER — SODIUM CHLORIDE 0.9% FLUSH: 3.0000 mL | Freq: Two times a day (BID) | INTRAVENOUS | Status: DC

## 2022-10-12 MED ORDER — DOCUSATE SODIUM 100 MG PO CAPS
100.0000 mg | ORAL_CAPSULE | Freq: Two times a day (BID) | ORAL | Status: DC
  Administered 2022-10-12 – 2022-10-13 (×2): 100 mg via ORAL
  Filled 2022-10-12 (×2): qty 1

## 2022-10-12 MED ORDER — THROMBIN 5000 UNITS EX SOLR: OROMUCOSAL | Status: DC | PRN

## 2022-10-12 MED ORDER — PHENYLEPHRINE 80 MCG/ML (10ML) SYRINGE FOR IV PUSH (FOR BLOOD PRESSURE SUPPORT)
PREFILLED_SYRINGE | INTRAVENOUS | Status: DC | PRN
  Administered 2022-10-12: 80 ug via INTRAVENOUS
  Administered 2022-10-12 (×2): 160 ug via INTRAVENOUS

## 2022-10-12 MED ORDER — LACTATED RINGERS IV SOLN: INTRAVENOUS | Status: DC

## 2022-10-12 MED ORDER — CYCLOBENZAPRINE HCL 10 MG PO TABS
10.0000 mg | ORAL_TABLET | Freq: Every day | ORAL | Status: DC
  Filled 2022-10-12: qty 1

## 2022-10-12 MED ORDER — LORATADINE 10 MG PO TABS
10.0000 mg | ORAL_TABLET | Freq: Every day | ORAL | Status: DC
  Administered 2022-10-13: 10 mg via ORAL
  Filled 2022-10-12: qty 1

## 2022-10-12 MED ORDER — LIDOCAINE 2% (20 MG/ML) 5 ML SYRINGE
INTRAMUSCULAR | Status: DC | PRN
  Administered 2022-10-12: 60 mg via INTRAVENOUS

## 2022-10-12 MED ORDER — BUPIVACAINE LIPOSOME 1.3 % IJ SUSP
INTRAMUSCULAR | Status: AC
  Filled 2022-10-12: qty 20

## 2022-10-12 MED ORDER — DEXAMETHASONE SODIUM PHOSPHATE 4 MG/ML IJ SOLN
INTRAMUSCULAR | Status: DC | PRN
  Administered 2022-10-12: 4 mg via INTRAVENOUS

## 2022-10-12 MED ORDER — BUPIVACAINE-EPINEPHRINE (PF) 0.5% -1:200000 IJ SOLN
INTRAMUSCULAR | Status: DC | PRN
  Administered 2022-10-12: 10 mL

## 2022-10-12 MED ORDER — PHENOL 1.4 % MT LIQD: 1.0000 | OROMUCOSAL | Status: DC | PRN

## 2022-10-12 MED ORDER — MONTELUKAST SODIUM 10 MG PO TABS: 5.0000 mg | ORAL_TABLET | Freq: Every day | ORAL | Status: DC

## 2022-10-12 MED ORDER — OXYCODONE HCL 5 MG PO TABS: 5.0000 mg | ORAL_TABLET | ORAL | Status: DC | PRN

## 2022-10-12 MED ORDER — MORPHINE SULFATE (PF) 4 MG/ML IV SOLN: 4.0000 mg | INTRAVENOUS | Status: DC | PRN

## 2022-10-12 MED ORDER — MIDAZOLAM HCL 2 MG/2ML IJ SOLN
INTRAMUSCULAR | Status: AC
  Filled 2022-10-12: qty 2

## 2022-10-12 MED ORDER — CEFAZOLIN SODIUM 1 G IJ SOLR
INTRAMUSCULAR | Status: AC
  Filled 2022-10-12: qty 20

## 2022-10-12 MED ORDER — PROPOFOL 10 MG/ML IV BOLUS
INTRAVENOUS | Status: DC | PRN
  Administered 2022-10-12: 200 mg via INTRAVENOUS

## 2022-10-12 MED ORDER — INSULIN ASPART 100 UNIT/ML IJ SOLN
0.0000 [IU] | Freq: Three times a day (TID) | INTRAMUSCULAR | Status: DC
  Administered 2022-10-13: 4 [IU] via SUBCUTANEOUS

## 2022-10-12 MED ORDER — TAMSULOSIN HCL 0.4 MG PO CAPS
0.8000 mg | ORAL_CAPSULE | Freq: Every day | ORAL | Status: DC
  Administered 2022-10-13: 0.8 mg via ORAL
  Filled 2022-10-12: qty 2

## 2022-10-12 MED ORDER — SERTRALINE HCL 50 MG PO TABS
50.0000 mg | ORAL_TABLET | Freq: Every day | ORAL | Status: DC
  Administered 2022-10-13: 50 mg via ORAL
  Filled 2022-10-12: qty 1

## 2022-10-12 MED ORDER — PHENYLEPHRINE 80 MCG/ML (10ML) SYRINGE FOR IV PUSH (FOR BLOOD PRESSURE SUPPORT)
PREFILLED_SYRINGE | INTRAVENOUS | Status: AC
  Filled 2022-10-12: qty 10

## 2022-10-12 MED ORDER — TERBINAFINE HCL 250 MG PO TABS
250.0000 mg | ORAL_TABLET | Freq: Every day | ORAL | Status: DC
  Administered 2022-10-13: 250 mg via ORAL
  Filled 2022-10-12: qty 1

## 2022-10-12 MED ORDER — PANTOPRAZOLE SODIUM 40 MG PO TBEC
40.0000 mg | DELAYED_RELEASE_TABLET | Freq: Every day | ORAL | Status: DC
  Administered 2022-10-13: 40 mg via ORAL
  Filled 2022-10-12: qty 1

## 2022-10-12 MED ORDER — BISACODYL 10 MG RE SUPP: 10.0000 mg | Freq: Every day | RECTAL | Status: DC | PRN

## 2022-10-12 MED ORDER — CYCLOBENZAPRINE HCL 10 MG PO TABS
10.0000 mg | ORAL_TABLET | Freq: Three times a day (TID) | ORAL | Status: DC | PRN
  Administered 2022-10-12 – 2022-10-13 (×2): 10 mg via ORAL
  Filled 2022-10-12 (×2): qty 1

## 2022-10-12 MED ORDER — PHENYLEPHRINE HCL-NACL 20-0.9 MG/250ML-% IV SOLN
INTRAVENOUS | Status: DC | PRN
  Administered 2022-10-12: 50 ug/min via INTRAVENOUS

## 2022-10-12 MED ORDER — DEXAMETHASONE SODIUM PHOSPHATE 10 MG/ML IJ SOLN
INTRAMUSCULAR | Status: AC
  Filled 2022-10-12: qty 1

## 2022-10-12 MED ORDER — BUPIVACAINE-EPINEPHRINE (PF) 0.5% -1:200000 IJ SOLN
INTRAMUSCULAR | Status: AC
  Filled 2022-10-12: qty 30

## 2022-10-12 MED ORDER — SODIUM CHLORIDE 0.9% FLUSH: 3.0000 mL | INTRAVENOUS | Status: DC | PRN

## 2022-10-12 MED ORDER — FENTANYL CITRATE (PF) 100 MCG/2ML IJ SOLN
INTRAMUSCULAR | Status: DC | PRN
  Administered 2022-10-12 (×5): 50 ug via INTRAVENOUS
  Administered 2022-10-12 (×2): 100 ug via INTRAVENOUS
  Administered 2022-10-12: 50 ug via INTRAVENOUS

## 2022-10-12 MED ORDER — LIDOCAINE 2% (20 MG/ML) 5 ML SYRINGE
INTRAMUSCULAR | Status: AC
  Filled 2022-10-12: qty 5

## 2022-10-12 MED ORDER — ONDANSETRON HCL 4 MG/2ML IJ SOLN: 4.0000 mg | Freq: Four times a day (QID) | INTRAMUSCULAR | Status: DC | PRN

## 2022-10-12 MED ORDER — ONDANSETRON HCL 4 MG/2ML IJ SOLN
INTRAMUSCULAR | Status: DC | PRN
  Administered 2022-10-12: 4 mg via INTRAVENOUS

## 2022-10-12 MED ORDER — ACETAMINOPHEN 650 MG RE SUPP: 650.0000 mg | RECTAL | Status: DC | PRN

## 2022-10-12 MED ORDER — CEFAZOLIN IN SODIUM CHLORIDE 3-0.9 GM/100ML-% IV SOLN
3.0000 g | INTRAVENOUS | Status: DC
  Filled 2022-10-12: qty 100

## 2022-10-12 MED ORDER — ONDANSETRON HCL 4 MG PO TABS: 4.0000 mg | ORAL_TABLET | Freq: Four times a day (QID) | ORAL | Status: DC | PRN

## 2022-10-12 MED ORDER — FENTANYL CITRATE (PF) 100 MCG/2ML IJ SOLN
INTRAMUSCULAR | Status: AC
  Filled 2022-10-12: qty 2

## 2022-10-12 MED ORDER — AMISULPRIDE (ANTIEMETIC) 5 MG/2ML IV SOLN: 10.0000 mg | Freq: Once | INTRAVENOUS | Status: DC | PRN

## 2022-10-12 MED ORDER — THROMBIN 5000 UNITS EX SOLR
CUTANEOUS | Status: AC
  Filled 2022-10-12: qty 5000

## 2022-10-12 MED ORDER — ALBUMIN HUMAN 5 % IV SOLN: INTRAVENOUS | Status: DC | PRN

## 2022-10-12 MED ORDER — INSULIN ASPART 100 UNIT/ML IJ SOLN
0.0000 [IU] | Freq: Every day | INTRAMUSCULAR | Status: DC
  Administered 2022-10-12: 2 [IU] via SUBCUTANEOUS

## 2022-10-12 MED ORDER — METOPROLOL TARTRATE 25 MG PO TABS
25.0000 mg | ORAL_TABLET | Freq: Every day | ORAL | Status: DC
  Administered 2022-10-13: 25 mg via ORAL
  Filled 2022-10-12: qty 1

## 2022-10-12 MED ORDER — ACETAMINOPHEN 325 MG PO TABS: 650.0000 mg | ORAL_TABLET | ORAL | Status: DC | PRN

## 2022-10-12 MED ORDER — DEXTROSE 5 % IV SOLN
INTRAVENOUS | Status: DC | PRN
  Administered 2022-10-12: 3 g via INTRAVENOUS
  Administered 2022-10-12: 2 g via INTRAVENOUS

## 2022-10-12 MED ORDER — MENTHOL 3 MG MT LOZG: 1.0000 | LOZENGE | OROMUCOSAL | Status: DC | PRN

## 2022-10-12 MED ORDER — BENAZEPRIL HCL 20 MG PO TABS
40.0000 mg | ORAL_TABLET | Freq: Every day | ORAL | Status: DC
  Administered 2022-10-13: 40 mg via ORAL
  Filled 2022-10-12: qty 2

## 2022-10-12 MED ORDER — ORAL CARE MOUTH RINSE: 15.0000 mL | Freq: Once | OROMUCOSAL | Status: AC

## 2022-10-12 MED ORDER — ACETAMINOPHEN 500 MG PO TABS
1000.0000 mg | ORAL_TABLET | Freq: Once | ORAL | Status: DC
  Filled 2022-10-12: qty 2

## 2022-10-12 MED ORDER — GABAPENTIN 300 MG PO CAPS
600.0000 mg | ORAL_CAPSULE | Freq: Two times a day (BID) | ORAL | Status: DC
  Administered 2022-10-12 – 2022-10-13 (×2): 600 mg via ORAL
  Filled 2022-10-12 (×2): qty 2

## 2022-10-12 MED ORDER — EPHEDRINE 5 MG/ML INJ
INTRAVENOUS | Status: AC
  Filled 2022-10-12: qty 5

## 2022-10-12 MED ORDER — ATORVASTATIN CALCIUM 40 MG PO TABS
40.0000 mg | ORAL_TABLET | Freq: Every day | ORAL | Status: DC
  Administered 2022-10-13: 40 mg via ORAL
  Filled 2022-10-12: qty 1

## 2022-10-12 MED ORDER — OXYCODONE HCL 5 MG PO TABS
10.0000 mg | ORAL_TABLET | ORAL | Status: DC | PRN
  Administered 2022-10-12 – 2022-10-13 (×4): 10 mg via ORAL
  Filled 2022-10-12 (×5): qty 2

## 2022-10-12 MED ORDER — ROCURONIUM BROMIDE 10 MG/ML (PF) SYRINGE
PREFILLED_SYRINGE | INTRAVENOUS | Status: AC
  Filled 2022-10-12: qty 10

## 2022-10-12 SURGICAL SUPPLY — 62 items
APL SKNCLS STERI-STRIP NONHPOA (GAUZE/BANDAGES/DRESSINGS) ×2
BAG COUNTER SPONGE SURGICOUNT (BAG) ×3 IMPLANT
BAG SPNG CNTER NS LX DISP (BAG) ×4
BASKET BONE COLLECTION (BASKET) ×2 IMPLANT
BENZOIN TINCTURE PRP APPL 2/3 (GAUZE/BANDAGES/DRESSINGS) ×2 IMPLANT
BUR MATCHSTICK NEURO 3.0 LAGG (BURR) ×2 IMPLANT
BUR PRECISION FLUTE 6.0 (BURR) ×3 IMPLANT
CAGE ALTERA 10X31X9-13 15D (Cage) ×1 IMPLANT
CANISTER SUCT 3000ML PPV (MISCELLANEOUS) ×2 IMPLANT
CAP LOCK DLX THRD (Cap) ×10 IMPLANT
CNTNR URN SCR LID CUP LEK RST (MISCELLANEOUS) ×2 IMPLANT
CONT SPEC 4OZ STRL OR WHT (MISCELLANEOUS) ×2
COVER BACK TABLE 60X90IN (DRAPES) ×2 IMPLANT
COVERAGE SUPPORT O-ARM STEALTH (MISCELLANEOUS) ×2 IMPLANT
DRAPE C-ARM 42X72 X-RAY (DRAPES) ×5 IMPLANT
DRAPE HALF SHEET 40X57 (DRAPES) ×2 IMPLANT
DRAPE LAPAROTOMY 100X72X124 (DRAPES) ×2 IMPLANT
DRAPE SURG 17X23 STRL (DRAPES) ×8 IMPLANT
DRSG OPSITE 4X5.5 SM (GAUZE/BANDAGES/DRESSINGS) ×1 IMPLANT
DRSG OPSITE POSTOP 4X6 (GAUZE/BANDAGES/DRESSINGS) ×2 IMPLANT
DRSG OPSITE POSTOP 4X8 (GAUZE/BANDAGES/DRESSINGS) ×1 IMPLANT
ELECT BLADE 4.0 EZ CLEAN MEGAD (MISCELLANEOUS) ×4
ELECT REM PT RETURN 9FT ADLT (ELECTROSURGICAL) ×2
ELECTRODE BLDE 4.0 EZ CLN MEGD (MISCELLANEOUS) ×3 IMPLANT
ELECTRODE REM PT RTRN 9FT ADLT (ELECTROSURGICAL) ×2 IMPLANT
EVACUATOR 1/8 PVC DRAIN (DRAIN) IMPLANT
FEE COVERAGE SUPPORT O-ARM (MISCELLANEOUS) IMPLANT
GAUZE 4X4 16PLY ~~LOC~~+RFID DBL (SPONGE) ×2 IMPLANT
GLOVE BIO SURGEON STRL SZ 6 (GLOVE) ×2 IMPLANT
GLOVE BIO SURGEON STRL SZ8 (GLOVE) ×4 IMPLANT
GLOVE BIO SURGEON STRL SZ8.5 (GLOVE) ×4 IMPLANT
GLOVE BIOGEL PI IND STRL 6.5 (GLOVE) ×2 IMPLANT
GLOVE EXAM NITRILE XL STR (GLOVE) IMPLANT
GOWN STRL REUS W/ TWL LRG LVL3 (GOWN DISPOSABLE) ×3 IMPLANT
GOWN STRL REUS W/ TWL XL LVL3 (GOWN DISPOSABLE) ×6 IMPLANT
GOWN STRL REUS W/TWL 2XL LVL3 (GOWN DISPOSABLE) IMPLANT
GOWN STRL REUS W/TWL LRG LVL3 (GOWN DISPOSABLE) ×4
GOWN STRL REUS W/TWL XL LVL3 (GOWN DISPOSABLE) ×8
HEMOSTAT POWDER KIT SURGIFOAM (HEMOSTASIS) ×2 IMPLANT
KIT BASIN OR (CUSTOM PROCEDURE TRAY) ×2 IMPLANT
KIT GRAFTMAG DEL NEURO DISP (NEUROSURGERY SUPPLIES) ×1 IMPLANT
KIT TURNOVER KIT B (KITS) ×2 IMPLANT
NDL HYPO 21X1.5 SAFETY (NEEDLE) IMPLANT
NEEDLE HYPO 21X1.5 SAFETY (NEEDLE) ×2 IMPLANT
NEEDLE HYPO 22GX1.5 SAFETY (NEEDLE) ×2 IMPLANT
NS IRRIG 1000ML POUR BTL (IV SOLUTION) ×2 IMPLANT
PACK LAMINECTOMY NEURO (CUSTOM PROCEDURE TRAY) ×2 IMPLANT
PAD ARMBOARD 7.5X6 YLW CONV (MISCELLANEOUS) ×6 IMPLANT
PATTIES SURGICAL .5 X1 (DISPOSABLE) IMPLANT
PUTTY DBM 10CC CALC GRAN (Putty) ×1 IMPLANT
ROD CURVED TI 6.35X125 (Rod) ×2 IMPLANT
SCREW PA DLX CREO 7.5X50 (Screw) ×2 IMPLANT
SCREW PA DLX CREO 8.5X80 (Screw) ×2 IMPLANT
STRIP CLOSURE SKIN 1/2X4 (GAUZE/BANDAGES/DRESSINGS) ×2 IMPLANT
SUT VIC AB 1 CT1 18XBRD ANBCTR (SUTURE) ×3 IMPLANT
SUT VIC AB 1 CT1 8-18 (SUTURE) ×2
SUT VIC AB 2-0 CP2 18 (SUTURE) ×3 IMPLANT
SYR 20ML LL LF (SYRINGE) ×1 IMPLANT
TOWEL GREEN STERILE (TOWEL DISPOSABLE) ×2 IMPLANT
TOWEL GREEN STERILE FF (TOWEL DISPOSABLE) ×2 IMPLANT
TRAY FOLEY MTR SLVR 16FR STAT (SET/KITS/TRAYS/PACK) ×2 IMPLANT
WATER STERILE IRR 1000ML POUR (IV SOLUTION) ×2 IMPLANT

## 2022-10-12 NOTE — Progress Notes (Signed)
Orthopedic Tech Progress Note Patient Details:  Justin Soto [birth year] [id number]  Ortho Devices Type of Ortho Device: Lumbar corsett Ortho Device/Splint Location: BACK Ortho Device/Splint Interventions: Ordered   Post Interventions Patient Tolerated: Well Instructions Provided: Care of [name] 10/12/2022, 4:30 PM

## 2022-10-12 NOTE — Anesthesia Procedure Notes (Signed)
Procedure Name: Intubation Date/Time: 10/12/2022 8:44 AM  Performed by: Lieutenant Diego, CRNAPre-anesthesia Checklist: Patient identified, Emergency Drugs available, Suction available and Patient being monitored Patient Re-evaluated:Patient Re-evaluated prior to induction Oxygen Delivery Method: Circle system utilized Preoxygenation: Pre-oxygenation with 100% oxygen Induction Type: IV induction Ventilation: Mask ventilation without difficulty Laryngoscope Size: Miller, Glidescope and 2 Grade View: Grade III Tube type: Oral Tube size: 7.5 mm Number of attempts: 2 Airway Equipment and Method: Oral airway, Stylet and Video-laryngoscopy Placement Confirmation: ETT inserted through vocal cords under direct vision, positive ETCO2 and breath sounds checked- equal and bilateral Secured at: 23 cm Tube secured with: Tape Dental Injury: Teeth and Oropharynx as per pre-operative assessment  Comments: DL times one, grade 3 view, did not attempt to pass ETT, moved to glide scope. Grade one view with glide scope, ETT passes with ease. +etCO2, =BBS.

## 2022-10-12 NOTE — Op Note (Signed)
Brief history: The patient is a 61 year old white male on whom I previously performed an L3-4 and L4-5 decompression, instrumentation and fusion.  He initially did well but has developed recurrent back and right greater left buttock and leg pain consistent with lumbosacral radiculopathy.  He has failed medical management and was worked up with a lumbar MRI and lumbar x-rays which demonstrated L5-S1 spondylolisthesis with severe foraminal stenosis.  I discussed the various treatment options with him.  He has decided proceed with surgery.  Preoperative diagnosis: L5-S1 spondylolisthesis, foraminal stenosis, facet arthropathy, degenerative disc disease, spinal stenosis compressing both the L5 and the S1 nerve roots; lumbago; lumbar radiculopathy; neurogenic claudication  Postoperative diagnosis: The same  Procedure: Bilateral redo L5-S1 laminotomy/foraminotomies/medial facetectomy to decompress the bilateral L5 and S1 nerve roots(the work required to do this was in addition to the work required to do the posterior lumbar interbody fusion because of the patient's spinal stenosis, previous surgery, facet arthropathy. Etc. requiring a wide decompression of the nerve roots.);  Right L5-S1 transforaminal lumbar interbody fusion with local morselized autograft bone and Zimmer DBM; insertion of interbody prosthesis at L5-S1 (globus peek expandable interbody prosthesis); posterior segmental instrumentation from L3 to S1 with globus titanium pedicle screws and rods; posterior lateral arthrodesis at L5-S1 with local morselized autograft bone and Zimmer DBM; insertion of bilateral S2 AI screws; exploration of lumbar fusion/removal of lumbar hardware.  Surgeon: Dr. Earle Gell  Asst.: Dr. Consuella Lose and Arnetha Massy, NP  Anesthesia: Gen. endotracheal  Estimated blood loss: 400 cc  Drains: 1 medium Hemovac in the epidural space  Complications: None  Description of procedure: The patient was brought to  the operating room by the anesthesia team. General endotracheal anesthesia was induced. The patient was turned to the prone position on the [place] table. The patient's lumbosacral region was then prepared with Betadine scrub and Betadine solution. Sterile drapes were applied.  I then injected the area to be incised with Marcaine with epinephrine solution. I then used the scalpel to make a linear midline incision over the L3-4, L4-5 and L5-S1 interspace. I then used electrocautery to perform a bilateral subperiosteal dissection exposing the spinous process and lamina of L3-4, L4-5 and L5-S1 exposing the old hardware bilaterally from L3-L5. We then inserted the Verstrac retractor to provide exposure.  We explored the fusion by removing the caps from the old screws and then removing the rods.  We inspected the arthrodesis at L3-4 and L4-5.  It appeared solid.  I began the decompression by using the high speed drill to perform laminotomies at L5-S1 bilaterally. We then used the Kerrison punches to widen the laminotomy and removed the epidural scar tissue and ligamentum flavum at L5-S1 bilaterally. We used the Kerrison punches to remove the medial facets at L5-S1 bilaterally. We performed wide foraminotomies about the bilateral L5 and S1 nerve roots completing the decompression.  We now turned our attention to the posterior lumbar interbody fusion. I used a scalpel to incise the intervertebral disc at 5 S1 bilaterally. I then performed a partial intervertebral discectomy at L5-S1 bilaterally using the pituitary forceps. We prepared the vertebral endplates at E2-A8 bilaterally for the fusion by removing the soft tissues with the curettes. We then used the trial spacers to pick the appropriate sized interbody prosthesis. We prefilled his prosthesis with a combination of local morselized autograft bone that we obtained during the decompression as well as Zimmer DBM. We inserted the prefilled prosthesis into the  interspace at L5-S1 from the right,  we then turned and expanded the prosthesis. There was a good snug fit of the prosthesis in the interspace. We then filled and the remainder of the intervertebral disc space with local morselized autograft bone and Zimmer DBM. This completed the posterior lumbar interbody arthrodesis.  During the decompression and insertion of the prosthesis the assistant protected the thecal sac and nerve roots with the D'Errico retractor.  We now turned attention to the instrumentation. Under fluoroscopic guidance we cannulated the bilateral S1 pedicles with the bone probe. We then removed the bone probe. We then tapped the pedicle with a 6.5 millimeter tap. We then removed the tap. We probed inside the tapped pedicle with a ball probe to rule out cortical breaches. We then inserted a 7.5 x 50 millimeter pedicle screw into the S1 pedicles bilaterally under fluoroscopic guidance. We then palpated along the medial aspect of the pedicles to rule out cortical breaches. There were none. The nerve roots were not injured.  We then placed bilateral 8.5 x 80 S2 AI screws in a similar fashion fluoroscopic guidance.  We then connected the unilateral pedicle screws with a lordotic rod. We compressed the construct and secured the rod in place with the caps. We then tightened the caps appropriately. This completed the instrumentation from L3 to the ilium bilaterally.  We now turned our attention to the posterior lateral arthrodesis at L5-S1. We used the high-speed drill to decorticate the remainder of the facets, pars, transverse process at L5-S1. We then applied a combination of local morselized autograft bone and Zimmer DBM over these decorticated posterior lateral structures. This completed the posterior lateral arthrodesis.  We then obtained hemostasis using bipolar electrocautery. We irrigated the wound out with saline solution. We inspected the thecal sac and nerve roots and noted they were well  decompressed. We then removed the retractor.  We placed a medium Hemovac drain in the epidural space and tunneled it out through a separate stab wound.  We injected Exparel . We reapproximated patient's thoracolumbar fascia with interrupted #1 Vicryl suture. We reapproximated patient's subcutaneous tissue with interrupted 2-0 Vicryl suture. The reapproximated patient's skin with Steri-Strips and benzoin. The wound was then coated with bacitracin ointment. A sterile dressing was applied. The drapes were removed. The patient was subsequently returned to the supine position where they were extubated by the anesthesia team. He was then transported to the post anesthesia care unit in stable condition. All sponge instrument and needle counts were reportedly correct at the end of this case.

## 2022-10-12 NOTE — H&P (Signed)
Subjective: The patient is a 60 year old white male on whom I performed previous lumbar surgeries.  He is developed recurrent back and right greater left buttock and leg pain consistent with neurogenic claudication/lumbar radiculopathy.  He has failed medical management and was worked up with a lumbar MRI lumbar x-rays which demonstrated an L4-5 S1 spondylolisthesis with severe foraminal stenosis.  I discussed the various treatment options with him.  He has decided proceed with surgery.  Past Medical History:  Diagnosis Date   Anemia    Arthritis    Diabetes mellitus without complication (HCC)    diet controlled   Dyspnea    Enlarged prostate    hx of   Environmental and seasonal allergies    Frequency of urination    GERD (gastroesophageal reflux disease)    History of kidney stones    HOH (hard of hearing)    wears bilateral hearing aids   Hypertension    Irregular heart beat    Pt feels this every once in a while; PCP aware but stated it was OK   Kidney stone on right side 01/15/2017   Leg pain, right    Take Lyrica and Naproxen   MRSA (methicillin resistant staph aureus) culture positive    Neck pain of over 3 months duration    Prostate cancer ([place])    Sleep apnea    cpap-15 setting   Thalassemia    Tick fever     Past Surgical History:  Procedure Laterality Date   ANTERIOR CERVICAL DECOMP/DISCECTOMY FUSION N/A 11/13/2014   Procedure: Cervical three-four, Cervical six-seven anterior cervical decompression with fusion interbody prosthesis plating and bonegraft;  Surgeon: Newman Pies, MD;  Location: [place];  Service: Neurosurgery;  Laterality: N/A;  Cervical three-four, Cervical six-seven anterior cervical decompression with fusion interbody prosthesis plating and bonegraft   BACK SURGERY     Lumbar X 2   CALCANEAL OSTEOTOMY Right 01/11/2017   Procedure: CALCANEAL OSTEOTOMY;  Surgeon: Earnestine Leys, MD;  Location: [facility];  Service: Orthopedics;  Laterality: Right;    CIRCUMCISION N/A 12/13/2017   Procedure: PENILE BLOCK, LYSIS OF PENILE ADHESIONS;  Surgeon: Hollice Espy, MD;  Location: [facility];  Service: Urology;  Laterality: N/A;   COLONOSCOPY     COLONOSCOPY WITH PROPOFOL N/A 09/15/2021   Procedure: COLONOSCOPY WITH PROPOFOL;  Surgeon: Toledo, Benay Pike, MD;  Location: [facility];  Service: Gastroenterology;  Laterality: N/A;  DM   CYSTOSCOPY N/A 12/13/2017   Procedure: CYSTOSCOPY;  Surgeon: Hollice Espy, MD;  Location: [facility];  Service: Urology;  Laterality: N/A;   DIRECT LARYNGOSCOPY N/A 11/24/2020   Procedure: MICRO DIRECT LARYNGOSCOPY W/BIOPSY;  Surgeon: Beverly Gust, MD;  Location: [facility];  Service: ENT;  Laterality: N/A;   HERNIA REPAIR Left    groin   LEG SURGERY Right    Had surgery on right calf X 7   LITHOTRIPSY     LUMBAR WOUND DEBRIDEMENT N/A 11/28/2019   Procedure: LUMBAR WOUND DEBRIDEMENT;  Surgeon: Newman Pies, MD;  Location: [place];  Service: Neurosurgery;  Laterality: N/A;  LUMBAR WOUND DEBRIDEMENT   QUADRICEPS TENDON REPAIR Left 06/22/2015   Procedure: REPAIR QUADRICEP TENDON;  Surgeon: Earnestine Leys, MD;  Location: [facility];  Service: Orthopedics;  Laterality: Left;   QUADRICEPS TENDON REPAIR Left 08/12/2015   Procedure: REPAIR QUADRICEP TENDON;  Surgeon: Earnestine Leys, MD;  Location: [facility];  Service: Orthopedics;  Laterality: Left;   ROTATOR CUFF REPAIR Left    TONSILLECTOMY     VASECTOMY  with hydrocele repaired    Allergies  Allergen Reactions   Methocarbamol Other (See Comments)    Numbness, and change of taste    Tizanidine Nausea Only   Vicodin [Hydrocodone-Acetaminophen] Itching    Can take with benadryl     Social History   Tobacco Use   Smoking status: Never    Passive exposure: Never   Smokeless tobacco: Never  Substance Use Topics   Alcohol use: No    Family History  Problem Relation Age of Onset   Prostate cancer Father    Heart disease Father    Diabetes Father    Alzheimer's  disease Father    Anemia Mother    Osteoporosis Mother    Healthy Sister    Diabetes Brother    Hypertension Brother    Anemia Daughter    Diabetes Brother    Kidney disease Neg Hx    Bladder Cancer Neg Hx    Colon cancer Neg Hx    Prior to Admission medications   Medication Sig Start Date End Date Taking? Authorizing Provider  amLODipine-benazepril (LOTREL) 10-40 MG capsule TAKE 1 CAPSULE BY MOUTH EVERY DAY 09/22/22  Yes Simmons-Robinson, Makiera, MD  aspirin EC 81 MG tablet Take 81 mg by mouth daily.    Yes [provider]  atorvastatin (LIPITOR) 40 MG tablet TAKE 1 TABLET BY MOUTH EVERY DAY 07/23/22  Yes Jerrol Banana., MD  cetirizine (ZYRTEC) 10 MG tablet TAKE ONE TO TWO TABLETS BY MOUTH EVERY DAY AS NEEDED. Patient taking differently: Take 10 mg by mouth daily. 02/22/19  Yes Jerrol Banana., MD  cyclobenzaprine (FLEXERIL) 10 MG tablet Take 1 tablet (10 mg total) by mouth at bedtime. 03/09/22  Yes Jerrol Banana., MD  FARXIGA 10 MG TABS tablet TAKE 1 TABLET BY MOUTH EVERY DAY BEFORE BREAKFAST 06/21/22  Yes Jerrol Banana., MD  finasteride (PROSCAR) 5 MG tablet TAKE 1 TABLET (5 MG TOTAL) BY MOUTH DAILY. 06/21/22  Yes Stoioff, Ronda Fairly, MD  fluticasone (FLONASE) 50 MCG/ACT nasal spray USE TWO SPRAYS IN EACH NOSTRIL EVERY DAY 11/29/19  Yes Jerrol Banana., MD  gabapentin (NEURONTIN) 300 MG capsule Take 600 mg by mouth 2 (two) times daily.   Yes [provider]  hydrochlorothiazide (HYDRODIURIL) 25 MG tablet TAKE 1 TABLET BY MOUTH EVERY DAY 09/22/22  Yes Simmons-Robinson, Makiera, MD  meloxicam (MOBIC) 15 MG tablet Take 15 mg by mouth daily. 11/20/19  Yes [provider]  metoprolol tartrate (LOPRESSOR) 25 MG tablet TAKE 1 TABLET (25 MG TOTAL) BY MOUTH DAILY. 08/05/22  Yes Jerrol Banana., MD  montelukast (SINGULAIR) 10 MG tablet TAKE 1 TABLET BY MOUTH EVERY DAY IN THE MORNING 09/22/22  Yes Simmons-Robinson, Makiera, MD   Multiple Vitamin (MULTIVITAMIN WITH MINERALS) TABS tablet Take 1 tablet by mouth daily.   Yes [provider]  NON FORMULARY Pt uses a cpap nightly   Yes [provider]  omeprazole (PRILOSEC) 20 MG capsule Take 20 mg by mouth daily.    Yes [provider]  oxyCODONE-acetaminophen (PERCOCET) 10-325 MG tablet Take 1 tablet by mouth every 4 (four) hours as needed for pain. 03/21/22  Yes McGowan, [name] A, PA-C  sertraline (ZOLOFT) 50 MG tablet TAKE 1 TABLET BY MOUTH EVERY DAY 03/31/22  Yes Jerrol Banana., MD  tamsulosin (FLOMAX) 0.4 MG CAPS capsule TAKE 2 CAPSULES BY MOUTH EVERY DAY 06/21/22  Yes Stoioff, Ronda Fairly, MD  terbinafine (LAMISIL) 250  MG tablet Take 1 tablet (250 mg total) by mouth daily. 10/05/22  Yes Felipa Furnace, DPM  albuterol (VENTOLIN HFA) 108 (90 Base) MCG/ACT inhaler Inhale 2 puffs into the lungs every 6 (six) hours as needed for wheezing or shortness of breath. Patient not taking: Reported on 09/28/2022 12/09/21   Brunetta Jeans, PA-C  chlorpheniramine-HYDROcodone [facility]) 10-8 MG/5ML SUER Take 5 mLs by mouth every 12 (twelve) hours as needed for cough. Patient not taking: Reported on 09/28/2022 11/22/21   Birdie Sons, MD     Review of Systems  Positive ROS: As above  All other systems have been reviewed and were otherwise negative with the exception of those mentioned in the HPI and as above.  Objective: Vital signs in last 24 hours: Temp:  [98.7 F (37.1 C)] 98.7 F (37.1 C) (11/01 0640) Pulse Rate:  [88] 88 (11/01 0640) Resp:  [18] 18 (11/01 0640) BP: (152)/(83) 152/83 (11/01 0640) SpO2:  [98 %] 98 % (11/01 0640) Weight:  [124.7 kg] 124.7 kg (11/01 0640) Estimated body mass index is 44.39 kg/m as calculated from the following:   Height as of this encounter: '5\' 6"'$  (1.676 m).   Weight as of this encounter: 124.7 kg.   General Appearance: Alert, obese Head: Normocephalic, without obvious abnormality,  atraumatic Eyes: PERRL, conjunctiva/corneas clear, EOM's intact,    Ears: Normal  Throat: Normal  Neck: Supple, Back: His lumbar incision is well-healed. Lungs: Clear to auscultation bilaterally, respirations unlabored Heart: Regular rate and rhythm, no murmur, rub or gallop Abdomen: Soft, non-tender Extremities: Extremities normal, atraumatic, no cyanosis or edema Skin: unremarkable  NEUROLOGIC:   Mental status: alert and oriented,Motor Exam - grossly normal Sensory Exam - grossly normal Reflexes:  Coordination - grossly normal Gait - grossly normal Balance - grossly normal Cranial Nerves: I: smell Not tested  II: visual acuity  OS: Normal  OD: Normal   II: visual fields Full to confrontation  II: pupils Equal, round, reactive to light  III,VII: ptosis None  III,IV,VI: extraocular muscles  Full ROM  V: mastication Normal  V: facial light touch sensation  Normal  V,VII: corneal reflex  Present  VII: facial muscle function - upper  Normal  VII: facial muscle function - lower Normal  VIII: hearing Not tested  IX: soft palate elevation  Normal  IX,X: gag reflex Present  XI: trapezius strength  5/5  XI: sternocleidomastoid strength 5/5  XI: neck flexion strength  5/5  XII: tongue strength  Normal    Data Review Lab Results  Component Value Date   WBC 6.5 10/03/2022   HGB 11.7 (L) 10/03/2022   HCT 37.6 (L) 10/03/2022   MCV 65.3 (L) 10/03/2022   PLT 169 10/03/2022   Lab Results  Component Value Date   NA 136 10/03/2022   K 4.0 10/03/2022   CL 101 10/03/2022   CO2 22 10/03/2022   BUN 20 10/03/2022   CREATININE 0.90 10/03/2022   GLUCOSE 148 (H) 10/03/2022   Lab Results  Component Value Date   INR 1.0 01/31/2022    Assessment/Plan: Lumbosacral spondylolisthesis, lumbosacral foraminal stenosis, lumbosacral radiculopathy, lumbago: I have discussed the situation with the patient and his wife.  We have discussed the various treatment options including surgery.  I  described the surgical treatment option of an L5-S1 decompression, instrumentation and fusion to the ilium.  I have shown him surgical models.  I have given him a surgical pamphlet.  We have discussed the risk, benefits, alternatives,  expected postop course, and likelihood of achieving our goals with surgery.  I have answered all their questions.  He has decided proceed with surgery.   Ophelia Charter 10/12/2022 8:28 AM

## 2022-10-12 NOTE — Anesthesia Postprocedure Evaluation (Signed)
Anesthesia Post Note  Patient: [name]  Procedure(s) Performed: LUMBAR FIVE-SACRAL ONE POSTERIOR LUMBAR INTERBODY FUSION WITH EXTENSION OF FUSION TO ILIUM (Spine Lumbar)     Patient location during evaluation: PACU Anesthesia Type: General Level of consciousness: awake and alert Pain management: pain level controlled Vital Signs Assessment: post-procedure vital signs reviewed and stable Respiratory status: spontaneous breathing, nonlabored ventilation, respiratory function stable and patient connected to nasal cannula oxygen Cardiovascular status: blood pressure returned to baseline and stable Postop Assessment: no apparent nausea or vomiting Anesthetic complications: no   No notable events documented.  Last Vitals:  Vitals:   10/12/22 1615 10/12/22 1630  BP: 126/77 139/85  Pulse: (!) 109 (!) 110  Resp: 16 (!) 30  Temp:    SpO2: 91% 93%    Last Pain:  Vitals:   10/12/22 1615  TempSrc:   PainSc: 5                  Tiajuana Amass

## 2022-10-12 NOTE — Transfer of Care (Signed)
Immediate Anesthesia Transfer of Care Note  Patient: Justin Soto  Procedure(s) Performed: LUMBAR FIVE-SACRAL ONE POSTERIOR LUMBAR INTERBODY FUSION WITH EXTENSION OF FUSION TO ILIUM (Spine Lumbar)  Patient Location: PACU  Anesthesia Type:General  Level of Consciousness: awake and drowsy  Airway & Oxygen Therapy: Patient Spontanous Breathing and Patient connected to face mask oxygen  Post-op Assessment: Report given to RN and Post -op Vital signs reviewed and stable  Post vital signs: Reviewed and stable  Last Vitals:  Vitals Value Taken Time  BP 127/74 10/12/22 1516  Temp 37.1 C 10/12/22 1515  Pulse 105 10/12/22 1520  Resp 20 10/12/22 1520  SpO2 97 % 10/12/22 1520  Vitals shown include unvalidated device data.  Last Pain:  Vitals:   10/12/22 0701  TempSrc:   PainSc: 3       Patients Stated Pain Goal: 3 (71/69/67 8938)  Complications: No notable events documented.

## 2022-10-13 DIAGNOSIS — K219 Gastro-esophageal reflux disease without esophagitis: Secondary | ICD-10-CM | POA: Diagnosis not present

## 2022-10-13 DIAGNOSIS — E119 Type 2 diabetes mellitus without complications: Secondary | ICD-10-CM | POA: Diagnosis not present

## 2022-10-13 DIAGNOSIS — G4730 Sleep apnea, unspecified: Secondary | ICD-10-CM | POA: Diagnosis not present

## 2022-10-13 DIAGNOSIS — M48062 Spinal stenosis, lumbar region with neurogenic claudication: Secondary | ICD-10-CM | POA: Diagnosis not present

## 2022-10-13 DIAGNOSIS — M1990 Unspecified osteoarthritis, unspecified site: Secondary | ICD-10-CM | POA: Diagnosis not present

## 2022-10-13 DIAGNOSIS — D649 Anemia, unspecified: Secondary | ICD-10-CM | POA: Diagnosis not present

## 2022-10-13 DIAGNOSIS — Z7984 Long term (current) use of oral hypoglycemic drugs: Secondary | ICD-10-CM | POA: Diagnosis not present

## 2022-10-13 DIAGNOSIS — M5117 Intervertebral disc disorders with radiculopathy, lumbosacral region: Secondary | ICD-10-CM | POA: Diagnosis not present

## 2022-10-13 DIAGNOSIS — M5459 Other low back pain: Secondary | ICD-10-CM | POA: Diagnosis not present

## 2022-10-13 DIAGNOSIS — Z6841 Body Mass Index (BMI) 40.0 and over, adult: Secondary | ICD-10-CM | POA: Diagnosis not present

## 2022-10-13 DIAGNOSIS — M4807 Spinal stenosis, lumbosacral region: Secondary | ICD-10-CM | POA: Diagnosis not present

## 2022-10-13 DIAGNOSIS — M4317 Spondylolisthesis, lumbosacral region: Secondary | ICD-10-CM | POA: Diagnosis not present

## 2022-10-13 DIAGNOSIS — I10 Essential (primary) hypertension: Secondary | ICD-10-CM | POA: Diagnosis not present

## 2022-10-13 LAB — CBC
HCT: 30.1 % — ABNORMAL LOW (ref 39.0–52.0)
Hemoglobin: 9.5 g/dL — ABNORMAL LOW (ref 13.0–17.0)
MCH: 20.3 pg — ABNORMAL LOW (ref 26.0–34.0)
MCHC: 31.6 g/dL (ref 30.0–36.0)
MCV: 64.3 fL — ABNORMAL LOW (ref 80.0–100.0)
Platelets: 162 10*3/uL (ref 150–400)
RBC: 4.68 MIL/uL (ref 4.22–5.81)
RDW: 15.9 % — ABNORMAL HIGH (ref 11.5–15.5)
WBC: 9.3 10*3/uL (ref 4.0–10.5)
nRBC: 0 % (ref 0.0–0.2)

## 2022-10-13 LAB — BASIC METABOLIC PANEL
Anion gap: 8 (ref 5–15)
BUN: 13 mg/dL (ref 8–23)
CO2: 26 mmol/L (ref 22–32)
Calcium: 8.7 mg/dL — ABNORMAL LOW (ref 8.9–10.3)
Chloride: 101 mmol/L (ref 98–111)
Creatinine, Ser: 0.91 mg/dL (ref 0.61–1.24)
GFR, Estimated: >60 mL/min (ref >60)
Glucose, Bld: 149 mg/dL — ABNORMAL HIGH (ref 70–99)
Potassium: 3.9 mmol/L (ref 3.5–5.1)
Sodium: 135 mmol/L (ref 135–145)

## 2022-10-13 LAB — GLUCOSE, CAPILLARY: Glucose-Capillary: 157 mg/dL — ABNORMAL HIGH (ref 70–99)

## 2022-10-13 MED ORDER — DOCUSATE SODIUM 100 MG PO CAPS: 100.0000 mg | ORAL_CAPSULE | Freq: Two times a day (BID) | ORAL | 0 refills | Status: DC

## 2022-10-13 MED ORDER — CYCLOBENZAPRINE HCL 10 MG PO TABS: 10.0000 mg | ORAL_TABLET | Freq: Every day | ORAL | 1 refills | Status: AC

## 2022-10-13 MED ORDER — OXYCODONE-ACETAMINOPHEN 5-325 MG PO TABS: 1.0000 | ORAL_TABLET | ORAL | 0 refills | Status: DC | PRN

## 2022-10-13 NOTE — Discharge Instructions (Signed)
Wound Care Keep incision covered and dry for two days.    Do not put any creams, lotions, or ointments on incision. Leave steri-strips on back.  They will fall off by themselves. You are fine to shower. Let water run over incision and pat dry.  Activity Walk each and every day, increasing distance each day. No lifting greater than 5 lbs.  Avoid excessive back motion. No driving for 2 weeks; may ride as a passenger locally.  Diet Resume your normal diet.   Return to Work Will be discussed at your follow up appointment.  Call Your Doctor If Any of These Occur Redness, drainage, or swelling at the wound.  Temperature greater than 101 degrees. Severe pain not relieved by pain medication. Incision starts to come apart.  Follow Up Appt Call [phone] today for appointment in 2-3 weeks if you don't already have one or for any problems.  If you have any hardware placed in your spine, you will need an x-ray before your appointment.  

## 2022-10-13 NOTE — Evaluation (Signed)
Occupational Therapy Evaluation Patient Details Name: Justin Soto MRN: [id number] DOB: 02-19-1961 Today's Date: 10/13/2022   History of Present Illness 61 y/o male s/p bilateral decompression and PLIF L5-S1. PMH: diabetes, HTN, hx of back surgery, sleep apnea, hx of prostate cancer   Clinical Impression   PTA, pt was living with his wife and was independent. Currently, pt performing at [place] I level for ADLs and functional mobility using RW. Provided education and handout on back precautions, brace management, grooming, LB ADLs with AE, toileting with toilet aide, and tub transfer; pt demonstrated understanding. Answered all pt questions. Recommend dc home once medically stable per physician. All acute OT needs met and will sign off. Thank you.    Recommendations for follow up therapy are one component of a multi-disciplinary discharge planning process, led by the attending physician.  Recommendations may be updated based on patient status, additional functional criteria and insurance authorization.   Follow Up Recommendations  No OT follow up    Assistance Recommended at Discharge Intermittent Supervision/Assistance  Patient can return home with the following      Functional Status Assessment  Patient has had a recent decline in their functional status and demonstrates the ability to make significant improvements in function in a reasonable and predictable amount of time.  Equipment Recommendations  None recommended by OT    Recommendations for Other Services       Precautions / Restrictions Precautions Precautions: Back Precaution Booklet Issued: Yes (comment) Required Braces or Orthoses: Spinal Brace Spinal Brace: Lumbar corset Restrictions Weight Bearing Restrictions: No      Mobility Bed Mobility Overal bed mobility: Modified Independent             General bed mobility comments: able to recall log roll technique to return to supine     Transfers Overall transfer level: Needs assistance Equipment used: Rolling walker (2 wheels) Transfers: Sit to/from Stand Sit to Stand: Supervision           General transfer comment: safety      Balance Overall balance assessment: Needs assistance Sitting-balance support: No upper extremity supported, Feet supported Sitting balance-Leahy Scale: Good     Standing balance support: Bilateral upper extremity supported, Reliant on assistive device for balance Standing balance-Leahy Scale: Poor                             ADL either performed or assessed with clinical judgement   ADL Overall ADL's : Needs assistance/impaired                                       General ADL Comments: Pt performing at [place] I level for ADLs and functional mobility. Providing education on back precautions, brace management, grooming, LB ADLs, toileting, tub transfer, and use of AE. Providing handouts on AE for LB ADLs and toileting     Vision         Perception     Praxis      Pertinent Vitals/Pain Pain Assessment Pain Assessment: Faces Faces Pain Scale: Hurts little more Pain Location: back-incisional Pain Descriptors / Indicators: Sore Pain Intervention(s): Monitored during session, Limited activity within patient's tolerance, Repositioned     Hand Dominance Right   Extremity/Trunk Assessment Upper Extremity Assessment Upper Extremity Assessment: Generalized weakness   Lower Extremity Assessment Lower Extremity Assessment: Defer to PT evaluation  Cervical / Trunk Assessment Cervical / Trunk Assessment: Back Surgery   Communication Communication Communication: No difficulties   Cognition Arousal/Alertness: Awake/alert Behavior During Therapy: WFL for tasks assessed/performed Overall Cognitive Status: Within Functional Limits for tasks assessed                                       General Comments        Exercises     Shoulder Instructions      Home Living Family/patient expects to be discharged to:: Private residence Living Arrangements: Spouse/significant other Available Help at Discharge: Family Type of Home: House Home Access: Stairs to enter Technical brewer of Steps: 4 Entrance Stairs-Rails: None Home Layout: Two level;Able to live on main level with bedroom/bathroom;Bed/bath upstairs Alternate Level Stairs-Number of Steps: flight Alternate Level Stairs-Rails: None Bathroom Shower/Tub: Tub/shower unit;Curtain   Bathroom Toilet: Handicapped height     Home Equipment: Rollator (4 wheels);Shower seat          Prior Functioning/Environment Prior Level of Function : Independent/Modified Independent;Working/employed;Driving               ADLs Comments: Works at the [employer]        OT Problem List: Decreased range of motion;Decreased activity tolerance;Impaired balance (sitting and/or standing);Decreased knowledge of use of DME or AE;Decreased knowledge of precautions      OT Treatment/Interventions:      OT Goals(Current goals can be found in the care plan section) Acute Rehab OT Goals Patient Stated Goal: Go home OT Goal Formulation: All assessment and education complete, DC therapy  OT Frequency:      Co-evaluation              AM-PAC OT "6 Clicks" Daily Activity     Outcome Measure Help from another person eating meals?: None Help from another person taking care of personal grooming?: A Little Help from another person toileting, which includes using toliet, bedpan, or urinal?: A Little Help from another person bathing (including washing, rinsing, drying)?: A Little Help from another person to put on and taking off regular upper body clothing?: None Help from another person to put on and taking off regular lower body clothing?: A Little 6 Click Score: 20   End of Session Equipment Utilized During Treatment: Back brace;Rolling walker  (2 wheels) Nurse Communication: Mobility status  Activity Tolerance: Patient tolerated treatment well Patient left:  (in hallway with PT)  OT Visit Diagnosis: Unsteadiness on feet (R26.81);Other abnormalities of gait and mobility (R26.89);Muscle weakness (generalized) (M62.81)                Time: 6812-7517 OT Time Calculation (min): 22 min Charges:  OT General Charges $OT Visit: 1 Visit OT Evaluation $OT Eval Low Complexity: 1 Low  Markeisha Mancias MSOT, OTR/L Acute Rehab Office: [place] 10/13/2022, 10:30 AM

## 2022-10-13 NOTE — Discharge Summary (Signed)
Physician Discharge Summary     Providing Compassionate, Quality Care - Together   Patient ID: Justin Soto MRN: [id number] DOB/AGE: [birth year] 61 y.o.  Admit date: 10/12/2022 Discharge date: 10/13/2022  Admission Diagnoses: Spondylolisthesis  Discharge Diagnoses:  Principal Problem:   Spondylolisthesis of lumbosacral region   Discharged Condition: good  Hospital Course: Patient underwent an L5-S1 PLIF with posterior segmental instrumentation from his old fusion at L3-4 to S1 by Dr. Arnoldo Morale on 10/12/2022. He was admitted to [facility] following recovery from anesthesia in the PACU. His postoperative course has been uncomplicated. He has worked with both physical and occupational therapies who feel the patient is ready for discharge home. He is ambulating independently and without difficulty. He is tolerating a normal diet. He is not having any bowel or bladder dysfunction. His pain is well-controlled with oral pain medication. He is ready for discharge home.   Consults: PT/OT  Significant Diagnostic Studies: radiology: DG Lumbar Spine 2-3 Views  Result Date: 10/12/2022 CLINICAL DATA:  Lumbar surgery EXAM: LUMBAR SPINE - 2-3 VIEW COMPARISON:  None Available. FINDINGS: Fluoroscopic images are provided from L5 ileum fusion. Fluoroscopy time reported is 51 seconds with 50.4 mGy. There are 2 images. IMPRESSION: Fluoroscopic images are provided from L5 ileum fusion. Electronically Signed   By: Ulyses Jarred M.D.   On: 10/12/2022 14:19   DG C-Arm 1-60 Min-No Report  Result Date: 10/12/2022 Fluoroscopy was utilized by the requesting physician.  No radiographic interpretation.   DG C-Arm 1-60 Min-No Report  Result Date: 10/12/2022 Fluoroscopy was utilized by the requesting physician.  No radiographic interpretation.     Treatments: surgery: Bilateral redo L5-S1 laminotomy/foraminotomies/medial facetectomy to decompress the bilateral L5 and S1 nerve roots(the work required to do this was in  addition to the work required to do the posterior lumbar interbody fusion because of the patient's spinal stenosis, previous surgery, facet arthropathy. Etc. requiring a wide decompression of the nerve roots.);  Right L5-S1 transforaminal lumbar interbody fusion with local morselized autograft bone and Zimmer DBM; insertion of interbody prosthesis at L5-S1 (globus peek expandable interbody prosthesis); posterior segmental instrumentation from L3 to S1 with globus titanium pedicle screws and rods; posterior lateral arthrodesis at L5-S1 with local morselized autograft bone and Zimmer DBM; insertion of bilateral S2 AI screws; exploration of lumbar fusion/removal of lumbar hardware.   Discharge Exam: Blood pressure 132/71, pulse 96, temperature 98.5 F (36.9 C), temperature source Oral, resp. rate 18, height '5\' 6"'$  (1.676 m), weight 124.7 kg, SpO2 94 %.  Alert and oriented x 4 PERRLA CN II-XII grossly intact MAE, Strength and sensation intact Incision is covered with Honeycomb dressing and Steri Strips; Dressing is clean, dry, and intact   Disposition: Discharge disposition: 01-Home or Self Care       Discharge Instructions     Call MD for:  difficulty breathing, headache or visual disturbances   Complete by: As directed    Call MD for:  persistant nausea and vomiting   Complete by: As directed    Call MD for:  redness, tenderness, or signs of infection (pain, swelling, redness, odor or green/yellow discharge around incision site)   Complete by: As directed    Call MD for:  severe uncontrolled pain   Complete by: As directed    Call MD for:  temperature >100.4   Complete by: As directed    Diet - low sodium heart healthy   Complete by: As directed    Increase activity slowly   Complete by: As  directed    Remove dressing in 48 hours   Complete by: As directed       Allergies as of 10/13/2022       Reactions   Methocarbamol Other (See Comments)   Numbness, and change of taste     Tizanidine Nausea Only   Vicodin [hydrocodone-acetaminophen] Itching   Can take with benadryl         Medication List     STOP taking these medications    albuterol 108 (90 Base) MCG/ACT inhaler Commonly known as: VENTOLIN HFA   meloxicam 15 MG tablet Commonly known as: MOBIC   oxyCODONE-acetaminophen 10-325 MG tablet Commonly known as: Percocet Replaced by: oxyCODONE-acetaminophen 5-325 MG tablet       TAKE these medications    amLODipine-benazepril 10-40 MG capsule Commonly known as: LOTREL TAKE 1 CAPSULE BY MOUTH EVERY DAY   aspirin EC 81 MG tablet Take 81 mg by mouth daily.   atorvastatin 40 MG tablet Commonly known as: LIPITOR TAKE 1 TABLET BY MOUTH EVERY DAY   cetirizine 10 MG tablet Commonly known as: ZYRTEC TAKE ONE TO TWO TABLETS BY MOUTH EVERY DAY AS NEEDED. What changed:  how much to take how to take this when to take this additional instructions   chlorpheniramine-HYDROcodone 10-8 MG/5ML Suer Commonly known as: Tussionex Pennkinetic ER Take 5 mLs by mouth every 12 (twelve) hours as needed for cough.   cyclobenzaprine 10 MG tablet Commonly known as: FLEXERIL Take 1 tablet (10 mg total) by mouth at bedtime.   docusate sodium 100 MG capsule Commonly known as: COLACE Take 1 capsule (100 mg total) by mouth 2 (two) times daily.   Farxiga 10 MG Tabs tablet Generic drug: dapagliflozin propanediol TAKE 1 TABLET BY MOUTH EVERY DAY BEFORE BREAKFAST   finasteride 5 MG tablet Commonly known as: PROSCAR TAKE 1 TABLET (5 MG TOTAL) BY MOUTH DAILY.   fluticasone 50 MCG/ACT nasal spray Commonly known as: FLONASE USE TWO SPRAYS IN EACH NOSTRIL EVERY DAY   gabapentin 300 MG capsule Commonly known as: NEURONTIN Take 600 mg by mouth 2 (two) times daily.   hydrochlorothiazide 25 MG tablet Commonly known as: HYDRODIURIL TAKE 1 TABLET BY MOUTH EVERY DAY   metoprolol tartrate 25 MG tablet Commonly known as: LOPRESSOR TAKE 1 TABLET (25 MG TOTAL) BY  MOUTH DAILY.   montelukast 10 MG tablet Commonly known as: SINGULAIR TAKE 1 TABLET BY MOUTH EVERY DAY IN THE MORNING   multivitamin with minerals Tabs tablet Take 1 tablet by mouth daily.   NON FORMULARY Pt uses a cpap nightly   omeprazole 20 MG capsule Commonly known as: PRILOSEC Take 20 mg by mouth daily.   oxyCODONE-acetaminophen 5-325 MG tablet Commonly known as: Percocet Take 1-2 tablets by mouth every 4 (four) hours as needed for severe pain. Replaces: oxyCODONE-acetaminophen 10-325 MG tablet   sertraline 50 MG tablet Commonly known as: ZOLOFT TAKE 1 TABLET BY MOUTH EVERY DAY   tamsulosin 0.4 MG Caps capsule Commonly known as: FLOMAX TAKE 2 CAPSULES BY MOUTH EVERY DAY   terbinafine 250 MG tablet Commonly known as: LamISIL Take 1 tablet (250 mg total) by mouth daily.        Follow-up Information     Newman Pies, MD. Go on 11/08/2022.   Specialty: Neurosurgery Why: First post op appointment with x-rays is on 11/08/2022 at 8:15 AM. Contact information: [address] [place] [address] [place] [address] [phone]  Signed: Viona Gilmore, DNP, AGNP-C Nurse Practitioner  [facility] [place] [address], [address], [place], [place] [address] P: [phone]    F: [phone]  10/13/2022, 10:55 AM

## 2022-10-13 NOTE — Plan of Care (Signed)
Pt and wife given D/C instructions with verbal understanding. Rx's were sent to the pharmacy by MD. Pt's incision is clean and dry with no sign of infection. Pt's IV and Hemovac were removed prior to D/C. Pt D/C'd home via wheelchair per MD order. Pt is stable @ D/C and has no other needs at this time. Holli Humbles, RN

## 2022-10-13 NOTE — Evaluation (Signed)
Physical Therapy Evaluation Patient Details Name: Justin Soto MRN: [id number] DOB: 08/30/1961 Today's Date: 10/13/2022  History of Present Illness  61 y/o male s/p bilateral decompression and PLIF L5-S1. PMH: diabetes, HTN, hx of back surgery, sleep apnea, hx of prostate cancer  Clinical Impression  Patient admitted following above procedure. Patient is typically independent and working. Wife will be present to assist for first 4-5 days. OOB with OT on arrival. Able to ambulate with RW and supervision as well as negotiate 5 stairs with HHA on L (does not have rails at home) and supervision. Good recall of back precautions from OT session. Educated patient on brace wear, progressive walking program, and car transfer, patient verbalized understanding. No further skilled PT needs identified acutely. No PT follow up recommended at this time.      Recommendations for follow up therapy are one component of a multi-disciplinary discharge planning process, led by the attending physician.  Recommendations may be updated based on patient status, additional functional criteria and insurance authorization.  Follow Up Recommendations No PT follow up      Assistance Recommended at Discharge Intermittent Supervision/Assistance  Patient can return home with the following  A little help with walking and/or transfers;A little help with bathing/dressing/bathroom;Assistance with cooking/housework;Assist for transportation;Help with stairs or ramp for entrance    Equipment Recommendations None recommended by PT  Recommendations for Other Services       Functional Status Assessment Patient has had a recent decline in their functional status and demonstrates the ability to make significant improvements in function in a reasonable and predictable amount of time.     Precautions / Restrictions Precautions Precautions: Back Precaution Booklet Issued: Yes (comment) Required Braces or Orthoses: Spinal  Brace Spinal Brace: Lumbar corset Restrictions Weight Bearing Restrictions: No      Mobility  Bed Mobility Overal bed mobility: Modified Independent             General bed mobility comments: able to recall log roll technique to return to supine    Transfers Overall transfer level: Needs assistance Equipment used: Rolling Chimere Klingensmith (2 wheels) Transfers: Sit to/from Stand Sit to Stand: Supervision                Ambulation/Gait Ambulation/Gait assistance: Supervision Gait Distance (Feet): 200 Feet Assistive device: Rolling Deena Shaub (2 wheels) Gait Pattern/deviations: Step-through pattern, Decreased stride length, Trunk flexed Gait velocity: decreased     General Gait Details: cues for upright posture  Stairs Stairs: Yes Stairs assistance: Supervision Stair Management: Step to pattern, Forwards (HHA on L) Number of Stairs: 5 General stair comments: HHA on L as patient does not have rails at home. Instructed on up with stronger leg and down with weaker leg  Wheelchair Mobility    Modified Rankin (Stroke Patients Only)       Balance Overall balance assessment: Needs assistance Sitting-balance support: No upper extremity supported, Feet supported Sitting balance-Leahy Scale: Good     Standing balance support: Bilateral upper extremity supported, Reliant on assistive device for balance Standing balance-Leahy Scale: Poor                               Pertinent Vitals/Pain Pain Assessment Pain Assessment: Faces Faces Pain Scale: Hurts little more Pain Location: back-incisional Pain Descriptors / Indicators: Sore Pain Intervention(s): Monitored during session    Home Living Family/patient expects to be discharged to:: Private residence Living Arrangements: Spouse/significant other Available Help at Discharge:  Family Type of Home: House Home Access: Stairs to enter Entrance Stairs-Rails: None Entrance Stairs-Number of Steps: 4 Alternate  Level Stairs-Number of Steps: flight Home Layout: Two level;Able to live on main level with bedroom/bathroom;Bed/bath upstairs Home Equipment: Rollator (4 wheels)      Prior Function Prior Level of Function : Independent/Modified Independent;Working/employed;Driving                     Hand Dominance        Extremity/Trunk Assessment   Upper Extremity Assessment Upper Extremity Assessment: Defer to OT evaluation    Lower Extremity Assessment Lower Extremity Assessment: Generalized weakness    Cervical / Trunk Assessment Cervical / Trunk Assessment: Back Surgery  Communication   Communication: No difficulties  Cognition Arousal/Alertness: Awake/alert Behavior During Therapy: WFL for tasks assessed/performed Overall Cognitive Status: Within Functional Limits for tasks assessed                                          General Comments      Exercises     Assessment/Plan    PT Assessment Patient does not need any further PT services  PT Problem List         PT Treatment Interventions      PT Goals (Current goals can be found in the Care Plan section)  Acute Rehab PT Goals Patient Stated Goal: to go home PT Goal Formulation: All assessment and education complete, DC therapy    Frequency       Co-evaluation               AM-PAC PT "6 Clicks" Mobility  Outcome Measure Help needed turning from your back to your side while in a flat bed without using bedrails?: None Help needed moving from lying on your back to sitting on the side of a flat bed without using bedrails?: None Help needed moving to and from a bed to a chair (including a wheelchair)?: A Little Help needed standing up from a chair using your arms (e.g., wheelchair or bedside chair)?: A Little Help needed to walk in hospital room?: A Little Help needed climbing 3-5 steps with a railing? : A Little 6 Click Score: 20    End of Session Equipment Utilized During  Treatment: Back brace Activity Tolerance: Patient tolerated treatment well Patient left: in bed;with call bell/phone within reach Nurse Communication: Mobility status PT Visit Diagnosis: Muscle weakness (generalized) (M62.81)    Time: 5916-3846 PT Time Calculation (min) (ACUTE ONLY): 16 min   Charges:   PT Evaluation $PT Eval Low Complexity: 1 Low          Justin Soto PT, DPT Acute Rehabilitation Services Office [phone]   Linna Hoff 10/13/2022, 9:13 AM

## 2022-10-13 NOTE — Progress Notes (Signed)
Subjective: The patient is alert and pleasant.  He wants to go home.  He looks well.  His wife is at the bedside.  Objective: Vital signs in last 24 hours: Temp:  [98.4 F (36.9 C)-99.7 F (37.6 C)] 98.5 F (36.9 C) (11/02 0805) Pulse Rate:  [96-113] 96 (11/02 0805) Resp:  [16-30] 18 (11/02 0805) BP: (126-156)/(71-89) 132/71 (11/02 0805) SpO2:  [91 %-98 %] 94 % (11/02 0805) Estimated body mass index is 44.39 kg/m as calculated from the following:   Height as of this encounter: '5\' 6"'$  (1.676 m).   Weight as of this encounter: 124.7 kg.   Intake/Output from previous day: 11/01 0701 - 11/02 0700 In: 2710 [P.O.:360; I.V.:1600; Blood:200; IV Piggyback:550] Out: 2245 [Urine:1485; Drains:360; Blood:400] Intake/Output this shift: No intake/output data recorded.  Physical exam patient is alert and pleasant.  His strength is normal.  Lab Results: Recent Labs    10/13/22 0545  WBC 9.3  HGB 9.5*  HCT 30.1*  PLT 162   BMET Recent Labs    10/13/22 0545  NA 135  K 3.9  CL 101  CO2 26  GLUCOSE 149*  BUN 13  CREATININE 0.91  CALCIUM 8.7*    Studies/Results: DG Lumbar Spine 2-3 Views  Result Date: 10/12/2022 CLINICAL DATA:  Lumbar surgery EXAM: LUMBAR SPINE - 2-3 VIEW COMPARISON:  None Available. FINDINGS: Fluoroscopic images are provided from L5 ileum fusion. Fluoroscopy time reported is 51 seconds with 50.4 mGy. There are 2 images. IMPRESSION: Fluoroscopic images are provided from L5 ileum fusion. Electronically Signed   By: Ulyses Jarred M.D.   On: 10/12/2022 14:19   DG C-Arm 1-60 Min-No Report  Result Date: 10/12/2022 Fluoroscopy was utilized by the requesting physician.  No radiographic interpretation.   DG C-Arm 1-60 Min-No Report  Result Date: 10/12/2022 Fluoroscopy was utilized by the requesting physician.  No radiographic interpretation.    Assessment/Plan: The patient is doing well.  I gave him his discharge instructions and answered all his questions.  LOS: 0  days     Ophelia Charter 10/13/2022, 10:51 AM

## 2022-10-20 MED FILL — Sodium Chloride IV Soln 0.9%: INTRAVENOUS | Qty: 1000 | Status: AC

## 2022-10-20 MED FILL — Heparin Sodium (Porcine) Inj 1000 Unit/ML: INTRAMUSCULAR | Qty: 30 | Status: AC

## 2022-10-26 DIAGNOSIS — G4733 Obstructive sleep apnea (adult) (pediatric): Secondary | ICD-10-CM | POA: Diagnosis not present

## 2022-10-26 DIAGNOSIS — M4317 Spondylolisthesis, lumbosacral region: Secondary | ICD-10-CM | POA: Diagnosis not present

## 2022-11-06 ENCOUNTER — Other Ambulatory Visit: Payer: Self-pay | Admitting: Podiatry

## 2022-11-06 ENCOUNTER — Other Ambulatory Visit: Payer: Self-pay | Admitting: Family Medicine

## 2022-11-06 DIAGNOSIS — I10 Essential (primary) hypertension: Secondary | ICD-10-CM

## 2022-11-08 NOTE — Telephone Encounter (Signed)
Last ordered 09/22/22 #90 too soon  Requested Prescriptions  Refused Prescriptions Disp Refills   amLODipine-benazepril (LOTREL) 10-40 MG capsule [Pharmacy Med Name: AMLODIPINE-BENAZEPRIL 10-40 MG] 90 capsule 0    Sig: TAKE 1 CAPSULE BY MOUTH EVERY DAY     Cardiovascular: CCB + ACEI Combos Failed - 11/06/2022 10:23 AM      Failed - Valid encounter within last 6 months    Recent Outpatient Visits           7 months ago Annual physical exam   [facility] Jerrol Banana., MD   8 months ago GAD (generalized anxiety disorder)   [facility] Jerrol Banana., MD   11 months ago Left otitis media, unspecified otitis media type   [facility] Birdie Sons, MD   1 year ago Type 2 diabetes mellitus without complication, without long-term current use of insulin [facility])   [facility] Jerrol Banana., MD   1 year ago Annual physical exam   [facility] [place], Clearnce Sorrel, [place]       Future Appointments             In 2 months Stoioff, Ronda Fairly, MD [place] in normal range and within 180 days    Creatinine  Date Value Ref Range Status  08/11/2014 1.01 0.60 - 1.30 mg/dL Final   Creatinine, Ser  Date Value Ref Range Status  10/13/2022 0.91 0.61 - 1.24 mg/dL Final         Passed - K in normal range and within 180 days    Potassium  Date Value Ref Range Status  10/13/2022 3.9 3.5 - 5.1 mmol/L Final  08/11/2014 4.1 3.5 - 5.1 mmol/L Final         Passed - Na in normal range and within 180 days    Sodium  Date Value Ref Range Status  10/13/2022 135 135 - 145 mmol/L Final  04/04/2022 137 134 - 144 mmol/L Final  08/11/2014 138 136 - 145 mmol/L Final         Passed - eGFR is 30 or above and within 180 days    EGFR (African American)  Date Value Ref Range Status  08/11/2014 >60  Final   GFR calc Af Amer  Date Value Ref Range Status   01/20/2021 92 >59 mL/min/1.73 Final    Comment:    **In accordance with recommendations from the NKF-ASN Task force,**   [facility] is in the process of updating its eGFR calculation to the   2021 CKD-EPI creatinine equation that estimates kidney function   without a race variable.    EGFR (Non-African Amer.)  Date Value Ref Range Status  08/11/2014 >60  Final    Comment:    eGFR values <30m/min/1.73 m2 may be an indication of chronic kidney disease (CKD). Calculated eGFR is useful in patients with stable renal function. The eGFR calculation will not be reliable in acutely ill patients when serum creatinine is changing rapidly. It is not useful in  patients on dialysis. The eGFR calculation may not be applicable to patients at the low and high extremes of body sizes, pregnant women, and vegetarians.    GFR, Estimated  Date Value Ref Range Status  10/13/2022 >60 >60 mL/min Final    Comment:    (NOTE) Calculated using the CKD-EPI Creatinine Equation (2021)  eGFR  Date Value Ref Range Status  04/04/2022 96 >59 mL/min/1.73 Final         Passed - Patient is not pregnant      Passed - Last BP in normal range    BP Readings from Last 1 Encounters:  10/13/22 132/71

## 2022-11-21 NOTE — Progress Notes (Signed)
11/22/22 4:13 PM   Justin Soto 1961-11-02 [id number]  Referring provider:  Maple Hudson., MD No address on file  Urological history: T1c low risk prostate cancer  - PSA (07/2022) 1.9 - Biopsy 07/2018; abnormal PSA velocity (2.0 uncorrected); 48 g  - 4/12 cores; Gleason 3+3 adenocarcinoma - Elected active surveillance - MRI 08/2020; 31 g prostate; no high-grade lesions noted - Confirmatory biopsy 12/09/2020; 31 g volume -1/12 cores; Gleason 3+3 adenocarcinoma 5% - has follow up in Feb 2024  2.  BPH with LUTS - Combination therapy tamsulosin/finasteride  3.  History stone disease - Prior SWL - CT 06/2017 with 3 mm right upper pole calculus - CT 03/2022 - possible 2-3 mm left proximal ureteral stone   Chief Complaint  Patient presents with   Other     HPI: Justin Soto is a 61 y.o.male who presents today for penile infection.   He states over the weekend he started to experience foreskin swelling and burning at the head of the penis.  He has been having some issue with penile irritation since his surgery in November where he had a catheter placed.  Patient denies any modifying or aggravating factors.  Patient denies any gross hematuria, dysuria or suprapubic/flank pain.  Patient denies any fevers, chills, nausea or vomiting.    UA is yellow slightly cloudy, 2+ glucose, specific gravity 1.020, trace blood, pH 5.5, leukocyte trace, WBC 6-10, 3-10 RBCs, 0-10 epithelial cells, moderate bacteria and yeast present.    PMH: Past Medical History:  Diagnosis Date   Anemia    Arthritis    Diabetes mellitus without complication (HCC)    diet controlled   Dyspnea    Enlarged prostate    hx of   Environmental and seasonal allergies    Frequency of urination    GERD (gastroesophageal reflux disease)    History of kidney stones    HOH (hard of hearing)    wears bilateral hearing aids   Hypertension    Irregular heart beat    Pt feels this every once in a while; PCP  aware but stated it was OK   Kidney stone on right side 01/15/2017   Leg pain, right    Take Lyrica and Naproxen   MRSA (methicillin resistant staph aureus) culture positive    Neck pain of over 3 months duration    Prostate cancer (HCC)    Sleep apnea    cpap-15 setting   Thalassemia    Tick fever     Surgical History: Past Surgical History:  Procedure Laterality Date   ANTERIOR CERVICAL DECOMP/DISCECTOMY FUSION N/A 11/13/2014   Procedure: Cervical three-four, Cervical six-seven anterior cervical decompression with fusion interbody prosthesis plating and bonegraft;  Surgeon: Tressie Stalker, MD;  Location: [facility] OR;  Service: Neurosurgery;  Laterality: N/A;  Cervical three-four, Cervical six-seven anterior cervical decompression with fusion interbody prosthesis plating and bonegraft   BACK SURGERY     Lumbar X 2   CALCANEAL OSTEOTOMY Right 01/11/2017   Procedure: CALCANEAL OSTEOTOMY;  Surgeon: Deeann Saint, MD;  Location: [facility] ORS;  Service: Orthopedics;  Laterality: Right;   CIRCUMCISION N/A 12/13/2017   Procedure: PENILE BLOCK, LYSIS OF PENILE ADHESIONS;  Surgeon: Vanna Scotland, MD;  Location: [facility] ORS;  Service: Urology;  Laterality: N/A;   COLONOSCOPY     COLONOSCOPY WITH PROPOFOL N/A 09/15/2021   Procedure: COLONOSCOPY WITH PROPOFOL;  Surgeon: Toledo, Boykin Nearing, MD;  Location: [facility] ENDOSCOPY;  Service: Gastroenterology;  Laterality: N/A;  DM  CYSTOSCOPY N/A 12/13/2017   Procedure: CYSTOSCOPY;  Surgeon: Vanna Scotland, MD;  Location: [facility] ORS;  Service: Urology;  Laterality: N/A;   DIRECT LARYNGOSCOPY N/A 11/24/2020   Procedure: MICRO DIRECT LARYNGOSCOPY W/BIOPSY;  Surgeon: Linus Salmons, MD;  Location: [facility] ORS;  Service: ENT;  Laterality: N/A;   HERNIA REPAIR Left    groin   LEG SURGERY Right    Had surgery on right calf X 7   LITHOTRIPSY     LUMBAR WOUND DEBRIDEMENT N/A 11/28/2019   Procedure: LUMBAR WOUND DEBRIDEMENT;  Surgeon: Tressie Stalker, MD;  Location: [facility] OR;   Service: Neurosurgery;  Laterality: N/A;  LUMBAR WOUND DEBRIDEMENT   QUADRICEPS TENDON REPAIR Left 06/22/2015   Procedure: REPAIR QUADRICEP TENDON;  Surgeon: Deeann Saint, MD;  Location: [facility] ORS;  Service: Orthopedics;  Laterality: Left;   QUADRICEPS TENDON REPAIR Left 08/12/2015   Procedure: REPAIR QUADRICEP TENDON;  Surgeon: Deeann Saint, MD;  Location: [facility] ORS;  Service: Orthopedics;  Laterality: Left;   ROTATOR CUFF REPAIR Left    TONSILLECTOMY     VASECTOMY     with hydrocele repaired    Home Medications:  Allergies as of 11/22/2022       Reactions   Methocarbamol Other (See Comments)   Numbness, and change of taste    Tizanidine Nausea Only   Vicodin [hydrocodone-acetaminophen] Itching   Can take with benadryl         Medication List        Accurate as of November 22, 2022  4:13 PM. If you have any questions, ask your nurse or doctor.          STOP taking these medications    chlorpheniramine-HYDROcodone 10-8 MG/5ML Suer Commonly known as: Tussionex Pennkinetic ER Stopped by: Michiel Cowboy, PA-C       TAKE these medications    amLODipine-benazepril 10-40 MG capsule Commonly known as: LOTREL TAKE 1 CAPSULE BY MOUTH EVERY DAY   aspirin EC 81 MG tablet Take 81 mg by mouth daily.   atorvastatin 40 MG tablet Commonly known as: LIPITOR TAKE 1 TABLET BY MOUTH EVERY DAY   cetirizine 10 MG tablet Commonly known as: ZYRTEC TAKE ONE TO TWO TABLETS BY MOUTH EVERY DAY AS NEEDED. What changed:  how much to take how to take this when to take this additional instructions   cyclobenzaprine 10 MG tablet Commonly known as: FLEXERIL Take 1 tablet (10 mg total) by mouth at bedtime.   docusate sodium 100 MG capsule Commonly known as: COLACE Take 1 capsule (100 mg total) by mouth 2 (two) times daily.   Farxiga 10 MG Tabs tablet Generic drug: dapagliflozin propanediol TAKE 1 TABLET BY MOUTH EVERY DAY BEFORE BREAKFAST   finasteride 5 MG tablet Commonly  known as: PROSCAR TAKE 1 TABLET (5 MG TOTAL) BY MOUTH DAILY.   fluconazole 100 MG tablet Commonly known as: Diflucan Take 1 tablet (100 mg total) by mouth daily. Started by: Michiel Cowboy, PA-C   fluticasone 50 MCG/ACT nasal spray Commonly known as: FLONASE USE TWO SPRAYS IN EACH NOSTRIL EVERY DAY   gabapentin 300 MG capsule Commonly known as: NEURONTIN Take 600 mg by mouth 2 (two) times daily.   hydrochlorothiazide 25 MG tablet Commonly known as: HYDRODIURIL TAKE 1 TABLET BY MOUTH EVERY DAY   metoprolol tartrate 25 MG tablet Commonly known as: LOPRESSOR TAKE 1 TABLET (25 MG TOTAL) BY MOUTH DAILY.   montelukast 10 MG tablet Commonly known as: SINGULAIR TAKE 1 TABLET BY MOUTH EVERY DAY IN THE  MORNING   multivitamin with minerals Tabs tablet Take 1 tablet by mouth daily.   NON FORMULARY Pt uses a cpap nightly   nystatin-triamcinolone ointment Commonly known as: MYCOLOG Apply 1 Application topically 2 (two) times daily. Started by: Michiel Cowboy, PA-C   omeprazole 20 MG capsule Commonly known as: PRILOSEC Take 20 mg by mouth daily.   oxyCODONE-acetaminophen 5-325 MG tablet Commonly known as: Percocet Take 1-2 tablets by mouth every 4 (four) hours as needed for severe pain.   sertraline 50 MG tablet Commonly known as: ZOLOFT TAKE 1 TABLET BY MOUTH EVERY DAY   tamsulosin 0.4 MG Caps capsule Commonly known as: FLOMAX TAKE 2 CAPSULES BY MOUTH EVERY DAY   terbinafine 250 MG tablet Commonly known as: LamISIL Take 1 tablet (250 mg total) by mouth daily.        Allergies:  Allergies  Allergen Reactions   Methocarbamol Other (See Comments)    Numbness, and change of taste    Tizanidine Nausea Only   Vicodin [Hydrocodone-Acetaminophen] Itching    Can take with benadryl     Family History: Family History  Problem Relation Age of Onset   Prostate cancer Father    Heart disease Father    Diabetes Father    Alzheimer's disease Father    Anemia Mother     Osteoporosis Mother    Healthy Sister    Diabetes Brother    Hypertension Brother    Anemia Daughter    Diabetes Brother    Kidney disease Neg Hx    Bladder Cancer Neg Hx    Colon cancer Neg Hx     Social History:  reports that he has never smoked. He has never been exposed to tobacco smoke. He has never used smokeless tobacco. He reports that he does not drink alcohol and does not use drugs.   Physical Exam: BP (!) 164/90   Pulse 88   Ht 5\' 6"  (1.676 m)   Wt 270 lb (122.5 kg)   BMI 43.58 kg/m   Constitutional:  Well nourished. Alert and oriented, No acute distress. HEENT: [place] AT, moist mucus membranes.  Trachea midline Cardiovascular: No clubbing, cyanosis, or edema. Respiratory: Normal respiratory effort, no increased work of breathing. GU: No CVA tenderness.  No bladder fullness or masses.  Patient with uncircumcised phallus. Foreskin edematous and could not be easily retracted  Urethral meatus is patent.  No penile discharge.  Penile glans erythemic.  Neurologic: Grossly intact, no focal deficits, moving all 4 extremities. Psychiatric: Normal mood and affect.   Laboratory Data: Lab Results  Component Value Date   CREATININE 0.91 10/13/2022   Lab Results  Component Value Date   HGBA1C 6.9 (H) 10/03/2022   Urinalysis See EPIC and HPI I have reviewed the labs.   Pertinent imaging:  N/A  Assessment & Plan:    1. Balanoposthitis -Diflucan 100 mg once daily for 7 days -Mycolog cream to apply twice daily  Return in about 2 weeks (around 12/06/2022) for recheck .  Cloretta Ned  [facility] [address] [place], [place] [address] [phone]

## 2022-11-22 ENCOUNTER — Encounter: Payer: Self-pay | Admitting: Urology

## 2022-11-22 ENCOUNTER — Ambulatory Visit: Payer: BC Managed Care – PPO | Admitting: Urology

## 2022-11-22 VITALS — BP 164/90 | HR 88 | Ht 66.0 in | Wt 270.0 lb

## 2022-11-22 DIAGNOSIS — N476 Balanoposthitis: Secondary | ICD-10-CM | POA: Diagnosis not present

## 2022-11-22 DIAGNOSIS — N4889 Other specified disorders of penis: Secondary | ICD-10-CM

## 2022-11-22 LAB — URINALYSIS, COMPLETE
Bilirubin, UA: NEGATIVE
Ketones, UA: NEGATIVE
Nitrite, UA: NEGATIVE
Protein,UA: NEGATIVE
Specific Gravity, UA: 1.02 (ref 1.005–1.030)
Urobilinogen, Ur: 0.2 mg/dL (ref 0.2–1.0)
pH, UA: 5.5 (ref 5.0–7.5)

## 2022-11-22 LAB — MICROSCOPIC EXAMINATION

## 2022-11-22 MED ORDER — FLUCONAZOLE 100 MG PO TABS: 100.0000 mg | ORAL_TABLET | Freq: Every day | ORAL | 0 refills | Status: DC

## 2022-11-22 MED ORDER — NYSTATIN-TRIAMCINOLONE 100000-0.1 UNIT/GM-% EX OINT: 1.0000 | TOPICAL_OINTMENT | Freq: Two times a day (BID) | CUTANEOUS | 0 refills | Status: DC

## 2022-11-25 DIAGNOSIS — G4733 Obstructive sleep apnea (adult) (pediatric): Secondary | ICD-10-CM | POA: Diagnosis not present

## 2022-11-28 NOTE — Progress Notes (Unsigned)
11/29/22 11:45 AM   Justin Soto 1961-03-27 [id number]  Referring provider:  Jerrol Banana., MD No address on file  Urological history: T1c low risk prostate cancer  - PSA (07/2022) 1.9 - Biopsy 07/2018; abnormal PSA velocity (2.0 uncorrected); 48 g  - 4/12 cores; Gleason 3+3 adenocarcinoma - Elected active surveillance - MRI 08/2020; 31 g prostate; no high-grade lesions noted - Confirmatory biopsy 12/09/2020; 31 g volume -1/12 cores; Gleason 3+3 adenocarcinoma 5% - has follow up in Feb 2024  2.  BPH with LUTS - Combination therapy tamsulosin/finasteride  3.  History stone disease - Prior SWL - CT 06/2017 with 3 mm right upper pole calculus - CT 03/2022 - possible 2-3 mm left proximal ureteral stone   Chief Complaint  Patient presents with   Other     HPI: Justin Soto is a 61 y.o.male who presents today for one week follow up.    He did have lysis of penile adhesions in 2019.    At his visit one week ago, he stated over the weekend he started to experience foreskin swelling and burning at the head of the penis.  He had been having some issue with penile irritation since his surgery in November where he had a catheter placed.  UA is yellow slightly cloudy, 2+ glucose, specific gravity 1.020, trace blood, pH 5.5, leukocyte trace, WBC 6-10, 3-10 RBCs, 0-10 epithelial cells, moderate bacteria and yeast present.  He was prescribed Diflucan and Mycolog cream and scheduled to return in one week.   He states the swelling has improved, but the foreskin is still adhesed to his glans.  Patient denies any modifying or aggravating factors.  Patient denies any gross hematuria, dysuria or suprapubic/flank pain.  Patient denies any fevers, chills, nausea or vomiting.     PMH: Past Medical History:  Diagnosis Date   Anemia    Arthritis    Diabetes mellitus without complication (HCC)    diet controlled   Dyspnea    Enlarged prostate    hx of   Environmental and seasonal  allergies    Frequency of urination    GERD (gastroesophageal reflux disease)    History of kidney stones    HOH (hard of hearing)    wears bilateral hearing aids   Hypertension    Irregular heart beat    Pt feels this every once in a while; PCP aware but stated it was OK   Kidney stone on right side 01/15/2017   Leg pain, right    Take Lyrica and Naproxen   MRSA (methicillin resistant staph aureus) culture positive    Neck pain of over 3 months duration    Prostate cancer ([place])    Sleep apnea    cpap-15 setting   Thalassemia    Tick fever     Surgical History: Past Surgical History:  Procedure Laterality Date   ANTERIOR CERVICAL DECOMP/DISCECTOMY FUSION N/A 11/13/2014   Procedure: Cervical three-four, Cervical six-seven anterior cervical decompression with fusion interbody prosthesis plating and bonegraft;  Surgeon: Newman Pies, MD;  Location: [place];  Service: Neurosurgery;  Laterality: N/A;  Cervical three-four, Cervical six-seven anterior cervical decompression with fusion interbody prosthesis plating and bonegraft   BACK SURGERY     Lumbar X 2   CALCANEAL OSTEOTOMY Right 01/11/2017   Procedure: CALCANEAL OSTEOTOMY;  Surgeon: Earnestine Leys, MD;  Location: [facility] ORS;  Service: Orthopedics;  Laterality: Right;   CIRCUMCISION N/A 12/13/2017   Procedure: PENILE BLOCK, LYSIS OF PENILE  ADHESIONS;  Surgeon: Hollice Espy, MD;  Location: [facility] ORS;  Service: Urology;  Laterality: N/A;   COLONOSCOPY     COLONOSCOPY WITH PROPOFOL N/A 09/15/2021   Procedure: COLONOSCOPY WITH PROPOFOL;  Surgeon: Toledo, Benay Pike, MD;  Location: [facility];  Service: Gastroenterology;  Laterality: N/A;  DM   CYSTOSCOPY N/A 12/13/2017   Procedure: CYSTOSCOPY;  Surgeon: Hollice Espy, MD;  Location: [facility];  Service: Urology;  Laterality: N/A;   DIRECT LARYNGOSCOPY N/A 11/24/2020   Procedure: MICRO DIRECT LARYNGOSCOPY W/BIOPSY;  Surgeon: Beverly Gust, MD;  Location: [facility] OR[facility];  Service: ENT;   Laterality: N/A;   HERNIA REPAIR Left    groin   LEG SURGERY Right    Had surgery on right calf X 7   LITHOTRIPSY     LUMBAR WOUND DEBRIDEMENT N/A 11/28/2019   Procedure: LUMBAR WOUND DEBRIDEMENT;  Surgeon: Newman Pies, MD;  Location: [place];  Service: Neurosurgery;  Laterality: N/A;  LUMBAR WOUND DEBRIDEMENT   QUADRICEPS TENDON REPAIR Left 06/22/2015   Procedure: REPAIR QUADRICEP TENDON;  Surgeon: Earnestine Leys, MD;  Location: [facility];  Service: Orthopedics;  Laterality: Left;   QUADRICEPS TENDON REPAIR Left 08/12/2015   Procedure: REPAIR QUADRICEP TENDON;  Surgeon: Earnestine Leys, MD;  Location: [facility];  Service: Orthopedics;  Laterality: Left;   ROTATOR CUFF REPAIR Left    TONSILLECTOMY     VASECTOMY     with hydrocele repaired    Home Medications:  Allergies as of 11/29/2022       Reactions   Methocarbamol Other (See Comments)   Numbness, and change of taste    Tizanidine Nausea Only   Vicodin [hydrocodone-acetaminophen] Itching   Can take with benadryl         Medication List        Accurate as of November 29, 2022 11:45 AM. If you have any questions, ask your nurse or doctor.          amLODipine-benazepril 10-40 MG capsule Commonly known as: LOTREL TAKE 1 CAPSULE BY MOUTH EVERY DAY   aspirin EC 81 MG tablet Take 81 mg by mouth daily.   atorvastatin 40 MG tablet Commonly known as: LIPITOR TAKE 1 TABLET BY MOUTH EVERY DAY   betamethasone dipropionate 0.05 % cream Apply topically 2 (two) times daily. Started by: Zara Council, PA-C   cetirizine 10 MG tablet Commonly known as: ZYRTEC TAKE ONE TO TWO TABLETS BY MOUTH EVERY DAY AS NEEDED. What changed:  how much to take how to take this when to take this additional instructions   cyclobenzaprine 10 MG tablet Commonly known as: FLEXERIL Take 1 tablet (10 mg total) by mouth at bedtime.   docusate sodium 100 MG capsule Commonly known as: COLACE Take 1 capsule (100 mg total) by mouth 2 (two)  times daily.   Farxiga 10 MG Tabs tablet Generic drug: dapagliflozin propanediol TAKE 1 TABLET BY MOUTH EVERY DAY BEFORE BREAKFAST   finasteride 5 MG tablet Commonly known as: PROSCAR TAKE 1 TABLET (5 MG TOTAL) BY MOUTH DAILY.   fluconazole 100 MG tablet Commonly known as: Diflucan Take 1 tablet (100 mg total) by mouth daily.   fluticasone 50 MCG/ACT nasal spray Commonly known as: FLONASE USE TWO SPRAYS IN EACH NOSTRIL EVERY DAY   gabapentin 300 MG capsule Commonly known as: NEURONTIN Take 600 mg by mouth 2 (two) times daily.   hydrochlorothiazide 25 MG tablet Commonly known as: HYDRODIURIL TAKE 1 TABLET BY MOUTH EVERY DAY   metoprolol tartrate 25 MG tablet  Commonly known as: LOPRESSOR TAKE 1 TABLET (25 MG TOTAL) BY MOUTH DAILY.   montelukast 10 MG tablet Commonly known as: SINGULAIR TAKE 1 TABLET BY MOUTH EVERY DAY IN THE MORNING   multivitamin with minerals Tabs tablet Take 1 tablet by mouth daily.   NON FORMULARY Pt uses a cpap nightly   nystatin-triamcinolone ointment Commonly known as: MYCOLOG Apply 1 Application topically 2 (two) times daily.   omeprazole 20 MG capsule Commonly known as: PRILOSEC Take 20 mg by mouth daily.   oxyCODONE-acetaminophen 5-325 MG tablet Commonly known as: Percocet Take 1-2 tablets by mouth every 4 (four) hours as needed for severe pain.   sertraline 50 MG tablet Commonly known as: ZOLOFT TAKE 1 TABLET BY MOUTH EVERY DAY   tamsulosin 0.4 MG Caps capsule Commonly known as: FLOMAX TAKE 2 CAPSULES BY MOUTH EVERY DAY   terbinafine 250 MG tablet Commonly known as: LamISIL Take 1 tablet (250 mg total) by mouth daily.        Allergies:  Allergies  Allergen Reactions   Methocarbamol Other (See Comments)    Numbness, and change of taste    Tizanidine Nausea Only   Vicodin [Hydrocodone-Acetaminophen] Itching    Can take with benadryl     Family History: Family History  Problem Relation Age of Onset   Prostate  cancer Father    Heart disease Father    Diabetes Father    Alzheimer's disease Father    Anemia Mother    Osteoporosis Mother    Healthy Sister    Diabetes Brother    Hypertension Brother    Anemia Daughter    Diabetes Brother    Kidney disease Neg Hx    Bladder Cancer Neg Hx    Colon cancer Neg Hx     Social History:  reports that he has never smoked. He has never been exposed to tobacco smoke. He has never used smokeless tobacco. He reports that he does not drink alcohol and does not use drugs.   Physical Exam: BP 130/80   Pulse 74   Ht '5\' 6"'$  (1.676 m)   Wt 264 lb (119.7 kg)   BMI 42.61 kg/m   Constitutional:  Well nourished. Alert and oriented, No acute distress. HEENT: [place] AT, moist mucus membranes.  Trachea midline Cardiovascular: No clubbing, cyanosis, or edema. Respiratory: Normal respiratory effort, no increased work of breathing. GU: No CVA tenderness.  No bladder fullness or masses.  Patient with uncircumcised (buried) phallus. Foreskin not easily retracted.  Foreskin is adhered to the glans almost circumferentially.  Urethral meatus is patent.  No penile discharge. No penile lesions or rashes. Scrotum without lesions, cysts, rashes and/or edema.   Neurologic: Grossly intact, no focal deficits, moving all 4 extremities. Psychiatric: Normal mood and affect.   Laboratory Data: N/A  Pertinent imaging:  N/A  Assessment & Plan:    1. Balanoposthitis -resolved  2. Penile adhesions -Prescribed Diprolene cream to apply twice daily -He would like to proceed with lysis of the penile adhesions in the OR and possible consideration for circumcision -A circumcision was not performed at the time of the lysis takedown in 2019 as it was felt it would cause tethering of the penis interfering with erections -I explained that it may still be the case that performing a circumcision may tether the penis, but it has been a few years and he is certainly had body habitus changes and  that may no longer be the case, but that would be decided during  the time of the procedure -I reviewed how the procedure was performed and what to expect if a circumcision was deemed appropriate at the time and the risks involved (penile pain, penile deformity, bleeding, infection) -He is agreeable and would like to proceed  Return for lysis of penile adhesions w/possible circ w/ Dr. Erlene Quan in the [facility] .  Justin Soto, [facility] [address], [place], [place] [address] [phone]

## 2022-11-29 ENCOUNTER — Encounter: Payer: Self-pay | Admitting: Urology

## 2022-11-29 ENCOUNTER — Ambulatory Visit: Payer: BC Managed Care – PPO | Admitting: Urology

## 2022-11-29 ENCOUNTER — Other Ambulatory Visit: Payer: Self-pay | Admitting: Urology

## 2022-11-29 VITALS — BP 130/80 | HR 74 | Ht 66.0 in | Wt 264.0 lb

## 2022-11-29 DIAGNOSIS — N4889 Other specified disorders of penis: Secondary | ICD-10-CM

## 2022-11-29 DIAGNOSIS — N476 Balanoposthitis: Secondary | ICD-10-CM | POA: Diagnosis not present

## 2022-11-29 MED ORDER — BETAMETHASONE DIPROPIONATE 0.05 % EX CREA: TOPICAL_CREAM | Freq: Two times a day (BID) | CUTANEOUS | 0 refills | Status: DC

## 2022-11-29 NOTE — Progress Notes (Unsigned)
Surgical Physician Order Form [facility]  * Scheduling expectation : Next Available with Dr. Erlene Quan (he prefers Wednesdays)   *Length of Case:   *Clearance needed: yes - Dr. Nehemiah Massed  *Anticoagulation Instructions: Hold all anticoagulants  *Aspirin Instructions: Hold Aspirin  *Post-op visit Date/Instructions:   TBD  *Diagnosis: Phimosis w/ penile adhesions   *Procedure: Lysis of penile adhesions w/ possible Circumcision (09927)    Additional orders: N/A  -Admit type: OUTpatient  -Anesthesia: General  -VTE Prophylaxis Standing Order SCD's       Other:   -Standing Lab Orders Per Anesthesia    Lab other: None  -Standing Test orders EKG/Chest x-ray per Anesthesia       Test other:   - Medications:  Ancef 2gm IV  -Other orders:  N/A

## 2022-12-08 ENCOUNTER — Other Ambulatory Visit: Payer: Self-pay | Admitting: Podiatry

## 2022-12-20 ENCOUNTER — Other Ambulatory Visit: Payer: Self-pay | Admitting: Podiatry

## 2022-12-20 ENCOUNTER — Other Ambulatory Visit: Payer: Self-pay | Admitting: Family Medicine

## 2022-12-20 ENCOUNTER — Other Ambulatory Visit: Payer: Self-pay | Admitting: Urology

## 2022-12-20 DIAGNOSIS — I10 Essential (primary) hypertension: Secondary | ICD-10-CM

## 2022-12-25 DIAGNOSIS — G4733 Obstructive sleep apnea (adult) (pediatric): Secondary | ICD-10-CM | POA: Diagnosis not present

## 2022-12-27 ENCOUNTER — Encounter: Payer: Self-pay | Admitting: Physician Assistant

## 2022-12-27 ENCOUNTER — Ambulatory Visit (INDEPENDENT_AMBULATORY_CARE_PROVIDER_SITE_OTHER): Payer: BC Managed Care – PPO | Admitting: Physician Assistant

## 2022-12-27 VITALS — BP 129/75 | Temp 98.6°F | Ht 66.0 in | Wt 278.0 lb

## 2022-12-27 DIAGNOSIS — F411 Generalized anxiety disorder: Secondary | ICD-10-CM | POA: Diagnosis not present

## 2022-12-27 DIAGNOSIS — E1159 Type 2 diabetes mellitus with other circulatory complications: Secondary | ICD-10-CM

## 2022-12-27 DIAGNOSIS — I152 Hypertension secondary to endocrine disorders: Secondary | ICD-10-CM

## 2022-12-27 DIAGNOSIS — E119 Type 2 diabetes mellitus without complications: Secondary | ICD-10-CM

## 2022-12-27 DIAGNOSIS — D649 Anemia, unspecified: Secondary | ICD-10-CM | POA: Diagnosis not present

## 2022-12-27 MED ORDER — SERTRALINE HCL 50 MG PO TABS: 50.0000 mg | ORAL_TABLET | Freq: Every day | ORAL | 3 refills | Status: DC

## 2022-12-27 MED ORDER — DAPAGLIFLOZIN PROPANEDIOL 10 MG PO TABS: ORAL_TABLET | ORAL | 3 refills | Status: DC

## 2022-12-27 NOTE — Progress Notes (Signed)
Established patient visit   Patient: Justin Soto   DOB: Jul 14, 1961   62 y.o. Male  MRN: [id number] Visit Date: 12/27/2022  Today's healthcare provider: Mikey Kirschner, PA-C   Chief Complaint  Patient presents with   Follow-up    Diabetes and meds refill.   Subjective    HPI  Hypertension, follow-up  BP Readings from Last 3 Encounters:  12/27/22 129/75  11/29/22 130/80  11/22/22 (!) 164/90   Wt Readings from Last 3 Encounters:  12/27/22 278 lb (126.1 kg)  11/29/22 264 lb (119.7 kg)  11/22/22 270 lb (122.5 kg)     He reports excellent compliance with treatment. He is not having side effects  He does not smoke.  Symptoms: No chest pain No chest pressure  No palpitations No syncope  No dyspnea No orthopnea  No paroxysmal nocturnal dyspnea No lower extremity edema   Pertinent labs Lab Results  Component Value Date   CHOL 117 01/20/2021   HDL 30 (L) 01/20/2021   LDLCALC 62 01/20/2021   TRIG 142 01/20/2021   Lab Results  Component Value Date   NA 135 10/13/2022   K 3.9 10/13/2022   CREATININE 0.91 10/13/2022   GFRNONAA >60 10/13/2022   GLUCOSE 149 (H) 10/13/2022   TSH 1.160 04/04/2022     The ASCVD Risk score (Arnett DK, et al., 2019) failed to calculate for the following reasons:   The valid total cholesterol range is 130 to 320 mg/dL  ---------------------------------------------------------------------------------------------------  Anxiety, Follow-up  . Changes made at last visit include managed on zoloft 50 mg.    He reports excellent compliance with treatment. He reports excellent tolerance of treatment. He is not having side effects.   He feels his anxiety is mild and Improved since last visit.  Symptoms: No chest pain No difficulty concentrating  No dizziness No fatigue  No feelings of losing control No insomnia  No irritable No palpitations  No panic attacks No racing thoughts  No shortness of breath No sweating  No  tremors/shakes    GAD-7 Results    12/27/2022    8:38 AM  GAD-7 Generalized Anxiety Disorder Screening Tool  1. Feeling Nervous, Anxious, or on Edge 1  2. Not Being Able to Stop or Control Worrying 0  3. Worrying Too Much About Different Things 1  4. Trouble Relaxing 2  5. Being So Restless it's Hard To Sit Still 1  6. Becoming Easily Annoyed or Irritable 1  7. Feeling Afraid As If Something Awful Might Happen 1  Total GAD-7 Score 7    PHQ-9 Scores    12/27/2022    8:33 AM 08/12/2022    1:24 PM 01/20/2021    9:24 AM  PHQ9 SCORE ONLY  PHQ-9 Total Score 0 0 0    ---------------------------------------------------------------------------------------------------  Diabetes Mellitus Type II, Follow-up  Lab Results  Component Value Date   HGBA1C 6.9 (H) 10/03/2022   HGBA1C 6.6 (H) 04/04/2022   HGBA1C 6.7 (A) 10/11/2021   Wt Readings from Last 3 Encounters:  12/27/22 278 lb (126.1 kg)  11/29/22 264 lb (119.7 kg)  11/22/22 270 lb (122.5 kg)   Management since then includes managed with farxiga 10 mg.  He reports excellent compliance with treatment. He is not having side effects.  Symptoms: No fatigue No foot ulcerations  No appetite changes No nausea  Yes paresthesia of the feet  No polydipsia  Yes polyuria No visual disturbances   No vomiting  Home blood sugar records: 160s  Episodes of hypoglycemia? No   Current insulin regiment: none Most Recent Eye Exam: not in recent history.  Pertinent Labs: Lab Results  Component Value Date   CHOL 117 01/20/2021   HDL 30 (L) 01/20/2021   LDLCALC 62 01/20/2021   TRIG 142 01/20/2021   Lab Results  Component Value Date   NA 135 10/13/2022   K 3.9 10/13/2022   CREATININE 0.91 10/13/2022   GFRNONAA >60 10/13/2022   LABMICR See below: 11/22/2022     ---------------------------------------------------------------------------------------------------   Medications: Outpatient Medications Prior to Visit  Medication  Sig   amLODipine-benazepril (LOTREL) 10-40 MG capsule TAKE 1 CAPSULE BY MOUTH EVERY DAY   aspirin EC 81 MG tablet Take 81 mg by mouth daily.    atorvastatin (LIPITOR) 40 MG tablet TAKE 1 TABLET BY MOUTH EVERY DAY   betamethasone dipropionate 0.05 % cream APPLY TOPICALLY TWICE A DAY   cetirizine (ZYRTEC) 10 MG tablet TAKE ONE TO TWO TABLETS BY MOUTH EVERY DAY AS NEEDED. (Patient taking differently: Take 10 mg by mouth daily.)   cyclobenzaprine (FLEXERIL) 10 MG tablet Take 1 tablet (10 mg total) by mouth at bedtime.   docusate sodium (COLACE) 100 MG capsule Take 1 capsule (100 mg total) by mouth 2 (two) times daily.   finasteride (PROSCAR) 5 MG tablet TAKE 1 TABLET (5 MG TOTAL) BY MOUTH DAILY.   fluconazole (DIFLUCAN) 100 MG tablet Take 1 tablet (100 mg total) by mouth daily.   fluticasone (FLONASE) 50 MCG/ACT nasal spray USE TWO SPRAYS IN EACH NOSTRIL EVERY DAY   gabapentin (NEURONTIN) 300 MG capsule Take 600 mg by mouth 2 (two) times daily.   hydrochlorothiazide (HYDRODIURIL) 25 MG tablet TAKE 1 TABLET BY MOUTH EVERY DAY   metoprolol tartrate (LOPRESSOR) 25 MG tablet TAKE 1 TABLET (25 MG TOTAL) BY MOUTH DAILY.   montelukast (SINGULAIR) 10 MG tablet TAKE 1 TABLET BY MOUTH EVERY DAY IN THE MORNING   Multiple Vitamin (MULTIVITAMIN WITH MINERALS) TABS tablet Take 1 tablet by mouth daily.   NON FORMULARY Pt uses a cpap nightly   nystatin-triamcinolone ointment (MYCOLOG) Apply 1 Application topically 2 (two) times daily.   omeprazole (PRILOSEC) 20 MG capsule Take 20 mg by mouth daily.    tamsulosin (FLOMAX) 0.4 MG CAPS capsule TAKE 2 CAPSULES BY MOUTH EVERY DAY   terbinafine (LAMISIL) 250 MG tablet Take 1 tablet (250 mg total) by mouth daily.   [DISCONTINUED] FARXIGA 10 MG TABS tablet TAKE 1 TABLET BY MOUTH EVERY DAY BEFORE BREAKFAST   [DISCONTINUED] oxyCODONE-acetaminophen (PERCOCET) 5-325 MG tablet Take 1-2 tablets by mouth every 4 (four) hours as needed for severe pain.   [DISCONTINUED]  sertraline (ZOLOFT) 50 MG tablet TAKE 1 TABLET BY MOUTH EVERY DAY   No facility-administered medications prior to visit.      Objective    BP 129/75 (BP Location: Right Arm, Patient Position: Sitting, Cuff Size: Large)   Temp 98.6 F (37 C)   Ht '5\' 6"'$  (1.676 m)   Wt 278 lb (126.1 kg)   BMI 44.87 kg/m   Physical Exam Constitutional:      General: He is awake.     Appearance: He is well-developed.  HENT:     Head: Normocephalic.  Eyes:     Conjunctiva/sclera: Conjunctivae normal.  Cardiovascular:     Rate and Rhythm: Normal rate and regular rhythm.     Pulses:          Dorsalis pedis pulses are 3+ on  the right side and 2+ on the left side.       Posterior tibial pulses are 3+ on the right side and 2+ on the left side.     Heart sounds: Normal heart sounds.  Pulmonary:     Effort: Pulmonary effort is normal.     Breath sounds: Normal breath sounds.  Feet:     Right foot:     Protective Sensation: 4 sites tested.  0 sites sensed.     Skin integrity: Skin integrity normal.     Toenail Condition: Right toenails are abnormally thick. Fungal disease present.    Left foot:     Protective Sensation: 4 sites tested.  4 sites sensed.     Skin integrity: Skin integrity normal.     Toenail Condition: Left toenails are abnormally thick. Fungal disease present. Skin:    General: Skin is warm.  Neurological:     Mental Status: He is alert and oriented to person, place, and time.  Psychiatric:        Attention and Perception: Attention normal.        Mood and Affect: Mood normal.        Speech: Speech normal.        Behavior: Behavior is cooperative.     No results found for any visits on 12/27/22.  Assessment & Plan     Problem List Items Addressed This Visit       Cardiovascular and Mediastinum   Hypertension associated with diabetes ([place])    Managed with amlodipine 10 mg, benazepril 40 mg, metoprolol 25 mg,  Well controlled Reviewed last cmp F/u 6 mo      Relevant  Medications   dapagliflozin propanediol (FARXIGA) 10 MG TABS tablet     Endocrine   Type 2 diabetes mellitus without complication, without long-term current use of insulin (HCC) - Primary    Last A1c 6.1%, ordered repeat Managed with farxiga 10 mg  Foot exam completed today Uacr ordered On statin, ace I  Reminded optho F/u 6 mo pending a1c      Relevant Medications   dapagliflozin propanediol (FARXIGA) 10 MG TABS tablet   Other Relevant Orders   HgB A1c   Urine Microalbumin w/creat. ratio     Other   Absolute anemia    Historically with worsening 2/2 surgery will repeat to ensure back to baseline      Relevant Orders   CBC w/Diff/Platelet   GAD (generalized anxiety disorder)    With panic d/o features. Stable on zoloft 50 mg.        Relevant Medications   sertraline (ZOLOFT) 50 MG tablet      Return in about 6 months (around 06/27/2023) for hypertension, DMII, anxiety.      I, Mikey Kirschner, PA-C have reviewed all documentation for this visit. The documentation on  12/27/22  for the exam, diagnosis, procedures, and orders are all accurate and complete.  Mikey Kirschner, PA-C [facility] [address] [place], [place], [address] Office: [phone] Fax: [place]

## 2022-12-27 NOTE — Assessment & Plan Note (Signed)
With panic d/o features. Stable on zoloft 50 mg.

## 2022-12-27 NOTE — Assessment & Plan Note (Signed)
Managed with amlodipine 10 mg, benazepril 40 mg, metoprolol 25 mg,  Well controlled Reviewed last cmp F/u 6 mo

## 2022-12-27 NOTE — Assessment & Plan Note (Signed)
Historically with worsening 2/2 surgery will repeat to ensure back to baseline

## 2022-12-27 NOTE — Assessment & Plan Note (Signed)
Last A1c 6.1%, ordered repeat Managed with farxiga 10 mg  Foot exam completed today Uacr ordered On statin, ace I  Reminded optho F/u 6 mo pending a1c

## 2022-12-27 NOTE — Assessment & Plan Note (Signed)
>>  ASSESSMENT AND PLAN FOR TYPE 2 DIABETES MELLITUS WITHOUT COMPLICATION, WITHOUT LONG-TERM CURRENT USE OF INSULIN (HCC) WRITTEN ON 12/27/2022  9:36 AM BY DRUBEL, LINDSAY, PA-C  Last A1c 6.1%, ordered repeat Managed with farxiga 10 mg  Foot exam completed today Uacr ordered On statin, ace I  Reminded optho F/u 6 mo pending a1c

## 2022-12-28 LAB — CBC WITH DIFFERENTIAL/PLATELET
Basophils Absolute: 0.1 10*3/uL (ref 0.0–0.2)
Basos: 1 %
EOS (ABSOLUTE): 0.3 10*3/uL (ref 0.0–0.4)
Eos: 5 %
Hematocrit: 38.4 % (ref 37.5–51.0)
Hemoglobin: 11.8 g/dL — ABNORMAL LOW (ref 13.0–17.7)
Immature Grans (Abs): 0 10*3/uL (ref 0.0–0.1)
Immature Granulocytes: 0 %
Lymphocytes Absolute: 1.2 10*3/uL (ref 0.7–3.1)
Lymphs: 23 %
MCH: 19.3 pg — ABNORMAL LOW (ref 26.6–33.0)
MCHC: 30.7 g/dL — ABNORMAL LOW (ref 31.5–35.7)
MCV: 63 fL — ABNORMAL LOW (ref 79–97)
Monocytes Absolute: 0.6 10*3/uL (ref 0.1–0.9)
Monocytes: 12 %
Neutrophils Absolute: 3.1 10*3/uL (ref 1.4–7.0)
Neutrophils: 59 %
Platelets: 183 10*3/uL (ref 150–450)
RBC: 6.12 x10E6/uL — ABNORMAL HIGH (ref 4.14–5.80)
RDW: 18 % — ABNORMAL HIGH (ref 11.6–15.4)
WBC: 5.2 10*3/uL (ref 3.4–10.8)

## 2022-12-28 LAB — HEMOGLOBIN A1C
Est. average glucose Bld gHb Est-mCnc: 146 mg/dL
Hgb A1c MFr Bld: 6.7 % — ABNORMAL HIGH (ref 4.8–5.6)

## 2022-12-28 LAB — MICROALBUMIN / CREATININE URINE RATIO
Creatinine, Urine: 74.9 mg/dL
Microalb/Creat Ratio: 25 mg/g creat (ref 0–29)
Microalbumin, Urine: 18.8 ug/mL

## 2022-12-28 NOTE — Progress Notes (Signed)
Patient ID: Justin Soto, male   DOB: 1961/02/01, 62 y.o.   MRN: [id number]  Chief Complaint: Large right upper back dermal cyst  History of Present Illness Justin Soto is a 62 y.o. male with a prominent and progressively symptomatic cyst of the parascapular area right upper back.  Has grown over the years approximately 5, there is never drainage, never pain, no fevers or chills. There is a smaller lesion on the lower back which is less concern.  Past Medical History Past Medical History:  Diagnosis Date   Anemia    Arthritis    Diabetes mellitus without complication (HCC)    diet controlled   Dyspnea    Enlarged prostate    hx of   Environmental and seasonal allergies    Frequency of urination    GERD (gastroesophageal reflux disease)    History of kidney stones    HOH (hard of hearing)    wears bilateral hearing aids   Hypertension    Irregular heart beat    Pt feels this every once in a while; PCP aware but stated it was OK   Kidney stone on right side 01/15/2017   Leg pain, right    Take Lyrica and Naproxen   MRSA (methicillin resistant staph aureus) culture positive    Neck pain of over 3 months duration    Prostate cancer ([place])    Sleep apnea    cpap-15 setting   Thalassemia    Tick fever       Past Surgical History:  Procedure Laterality Date   ANTERIOR CERVICAL DECOMP/DISCECTOMY FUSION N/A 11/13/2014   Procedure: Cervical three-four, Cervical six-seven anterior cervical decompression with fusion interbody prosthesis plating and bonegraft;  Surgeon: Newman Pies, MD;  Location: [facility];  Service: Neurosurgery;  Laterality: N/A;  Cervical three-four, Cervical six-seven anterior cervical decompression with fusion interbody prosthesis plating and bonegraft   BACK SURGERY     Lumbar X 2   CALCANEAL OSTEOTOMY Right 01/11/2017   Procedure: CALCANEAL OSTEOTOMY;  Surgeon: Earnestine Leys, MD;  Location: [facility];  Service: Orthopedics;  Laterality: Right;   CIRCUMCISION N/A  12/13/2017   Procedure: PENILE BLOCK, LYSIS OF PENILE ADHESIONS;  Surgeon: Hollice Espy, MD;  Location: [facility];  Service: Urology;  Laterality: N/A;   COLONOSCOPY     COLONOSCOPY WITH PROPOFOL N/A 09/15/2021   Procedure: COLONOSCOPY WITH PROPOFOL;  Surgeon: Toledo, Benay Pike, MD;  Location: [facility];  Service: Gastroenterology;  Laterality: N/A;  DM   CYSTOSCOPY N/A 12/13/2017   Procedure: CYSTOSCOPY;  Surgeon: Hollice Espy, MD;  Location: [facility];  Service: Urology;  Laterality: N/A;   DIRECT LARYNGOSCOPY N/A 11/24/2020   Procedure: MICRO DIRECT LARYNGOSCOPY W/BIOPSY;  Surgeon: Beverly Gust, MD;  Location: [facility];  Service: ENT;  Laterality: N/A;   HERNIA REPAIR Left    groin   LEG SURGERY Right    Had surgery on right calf X 7   LITHOTRIPSY     LUMBAR WOUND DEBRIDEMENT N/A 11/28/2019   Procedure: LUMBAR WOUND DEBRIDEMENT;  Surgeon: Newman Pies, MD;  Location: [facility];  Service: Neurosurgery;  Laterality: N/A;  LUMBAR WOUND DEBRIDEMENT   QUADRICEPS TENDON REPAIR Left 06/22/2015   Procedure: REPAIR QUADRICEP TENDON;  Surgeon: Earnestine Leys, MD;  Location: [facility];  Service: Orthopedics;  Laterality: Left;   QUADRICEPS TENDON REPAIR Left 08/12/2015   Procedure: REPAIR QUADRICEP TENDON;  Surgeon: Earnestine Leys, MD;  Location: [facility];  Service: Orthopedics;  Laterality: Left;   ROTATOR CUFF  REPAIR Left    TONSILLECTOMY     VASECTOMY     with hydrocele repaired    Allergies  Allergen Reactions   Methocarbamol Other (See Comments)    Numbness, and change of taste    Tizanidine Nausea Only   Vicodin [Hydrocodone-Acetaminophen] Itching    Can take with benadryl     Current Outpatient Medications  Medication Sig Dispense Refill   amLODipine-benazepril (LOTREL) 10-40 MG capsule TAKE 1 CAPSULE BY MOUTH EVERY DAY 90 capsule 0   aspirin EC 81 MG tablet Take 81 mg by mouth daily.      atorvastatin (LIPITOR) 40 MG tablet TAKE 1 TABLET BY MOUTH EVERY DAY 90 tablet 3    betamethasone dipropionate 0.05 % cream APPLY TOPICALLY TWICE A DAY 30 g 0   cetirizine (ZYRTEC) 10 MG tablet TAKE ONE TO TWO TABLETS BY MOUTH EVERY DAY AS NEEDED. (Patient taking differently: Take 10 mg by mouth daily.) 180 tablet 4   cyclobenzaprine (FLEXERIL) 10 MG tablet Take 1 tablet (10 mg total) by mouth at bedtime. 50 tablet 1   dapagliflozin propanediol (FARXIGA) 10 MG TABS tablet TAKE 1 TABLET BY MOUTH EVERY DAY BEFORE BREAKFAST 90 tablet 3   docusate sodium (COLACE) 100 MG capsule Take 1 capsule (100 mg total) by mouth 2 (two) times daily. 10 capsule 0   finasteride (PROSCAR) 5 MG tablet TAKE 1 TABLET (5 MG TOTAL) BY MOUTH DAILY. 90 tablet 3   fluconazole (DIFLUCAN) 100 MG tablet Take 1 tablet (100 mg total) by mouth daily. 7 tablet 0   fluticasone (FLONASE) 50 MCG/ACT nasal spray USE TWO SPRAYS IN EACH NOSTRIL EVERY DAY 48 mL 4   gabapentin (NEURONTIN) 300 MG capsule Take 600 mg by mouth 2 (two) times daily.     hydrochlorothiazide (HYDRODIURIL) 25 MG tablet TAKE 1 TABLET BY MOUTH EVERY DAY 90 tablet 0   metoprolol tartrate (LOPRESSOR) 25 MG tablet TAKE 1 TABLET (25 MG TOTAL) BY MOUTH DAILY. 90 tablet 3   montelukast (SINGULAIR) 10 MG tablet TAKE 1 TABLET BY MOUTH EVERY DAY IN THE MORNING 90 tablet 0   Multiple Vitamin (MULTIVITAMIN WITH MINERALS) TABS tablet Take 1 tablet by mouth daily.     NON FORMULARY Pt uses a cpap nightly     nystatin-triamcinolone ointment (MYCOLOG) Apply 1 Application topically 2 (two) times daily. 30 g 0   omeprazole (PRILOSEC) 20 MG capsule Take 20 mg by mouth daily.      sertraline (ZOLOFT) 50 MG tablet Take 1 tablet (50 mg total) by mouth daily. 90 tablet 3   tamsulosin (FLOMAX) 0.4 MG CAPS capsule TAKE 2 CAPSULES BY MOUTH EVERY DAY 180 capsule 3   terbinafine (LAMISIL) 250 MG tablet Take 1 tablet (250 mg total) by mouth daily. 90 tablet 0   No current facility-administered medications for this visit.    Family History Family History  Problem  Relation Age of Onset   Prostate cancer Father    Heart disease Father    Diabetes Father    Alzheimer's disease Father    Anemia Mother    Osteoporosis Mother    Healthy Sister    Diabetes Brother    Hypertension Brother    Anemia Daughter    Diabetes Brother    Kidney disease Neg Hx    Bladder Cancer Neg Hx    Colon cancer Neg Hx       Social History Social History   Tobacco Use   Smoking status: Never  Passive exposure: Never   Smokeless tobacco: Never  Vaping Use   Vaping Use: Never used  Substance Use Topics   Alcohol use: No   Drug use: No        Review of Systems  Constitutional: Negative.   HENT:  Positive for hearing loss.   Eyes: Negative.   Respiratory: Negative.    Cardiovascular: Negative.   Gastrointestinal:  Positive for heartburn.  Genitourinary: Negative.   Skin: Negative.   Neurological: Negative.   Psychiatric/Behavioral: Negative.       Physical Exam Blood pressure (!) 153/88, pulse 96, temperature 98.4 F (36.9 C), temperature source Oral, height '5\' 6"'$  (1.676 m), weight 280 lb (127 kg), SpO2 95 %. Last Weight  Most recent update: 12/29/2022  3:12 PM    Weight  127 kg (280 lb)             CONSTITUTIONAL: Well developed, and nourished, appropriately responsive and aware without distress.   EYES: Sclera non-icteric.   EARS, NOSE, MOUTH AND THROAT:  The oropharynx is clear. Oral mucosa is pink and moist.    Hearing is intact to voice.  NECK: Trachea is midline, and there is no jugular venous distension.  LYMPH NODES:  Lymph nodes in the neck are not appreciated. RESPIRATORY:  Lungs are clear, and breath sounds are equal bilaterally. Normal respiratory effort without pathologic use of accessory muscles. CARDIOVASCULAR: Heart is regular in rate and rhythm.  Well perfused.  GI: The abdomen is  soft, nontender, and nondistended. There were no palpable masses. I did not appreciate hepatosplenomegaly. There were normal bowel  sounds.  MUSCULOSKELETAL:  Symmetrical muscle tone appreciated in all four extremities.    SKIN: Skin turgor is normal. No pathologic skin lesions appreciated.  There is a 3.8 cm sized dermal cyst of the parascapular area right upper back raised from the normal skin by approximately 1 cm, the overlying skin is shiny, but it is not erythematous, not indurated.  There may be a small punctum present centrally.  But no other evidence of active inflammation or infection.  This is readily palpable through clothing and likely visible through clothing as well. NEUROLOGIC:  Motor and sensation appear grossly normal.  Cranial nerves are grossly without defect. PSYCH:  Alert and oriented to person, place and time. Affect is appropriate for situation.  Data Reviewed I have personally reviewed what is currently available of the patient's imaging, recent labs and medical records.   Labs:     Latest Ref Rng & Units 12/27/2022    9:08 AM 10/13/2022    5:45 AM 10/03/2022    8:30 AM  CBC  WBC 3.4 - 10.8 x10E3/uL 5.2  9.3  6.5   Hemoglobin 13.0 - 17.7 g/dL 11.8  9.5  11.7   Hematocrit 37.5 - 51.0 % 38.4  30.1  37.6   Platelets 150 - 450 x10E3/uL 183  162  169       Latest Ref Rng & Units 10/13/2022    5:45 AM 10/04/2022    9:41 AM 10/03/2022    8:30 AM  CMP  Glucose 70 - 99 mg/dL 149   148   BUN 8 - 23 mg/dL 13   20   Creatinine 0.61 - 1.24 mg/dL 0.91   0.90   Sodium 135 - 145 mmol/L 135   136   Potassium 3.5 - 5.1 mmol/L 3.9   4.0   Chloride 98 - 111 mmol/L 101   101   CO2 22 -  32 mmol/L 26   22   Calcium 8.9 - 10.3 mg/dL 8.7   9.3   Total Protein 6.0 - 8.5 g/dL  7.7    Total Bilirubin 0.0 - 1.2 mg/dL  0.7    Alkaline Phos 44 - 121 IU/L  70    AST 0 - 40 IU/L  34    ALT 0 - 44 IU/L  37        Imaging: Radiological images reviewed:   Within last 24 hrs: No results found.  Assessment    Symptomatic dermal cyst right upper back, 3.8 cm. Patient Active Problem List   Diagnosis Date Noted    GAD (generalized anxiety disorder) 12/27/2022   Spondylolisthesis of lumbosacral region 10/12/2022   Morbid obesity with BMI of 40.0-44.9, adult ([place]) 10/04/2022   Type 2 diabetes mellitus without complication, without long-term current use of insulin ([place]) 10/19/2020   Postoperative wound infection 11/28/2019   Lumbar stenosis with neurogenic claudication 10/23/2019   Precordial pain 06/20/2019   Trochanteric bursitis of right hip 11/01/2018   Prostate cancer ([place]) 99/24/2683   Umbilical hernia without obstruction and without gangrene 03/06/2018   Nephrolithiasis 01/15/2017   OSA (obstructive sleep apnea) 11/08/2016   ADD (attention deficit disorder) 09/08/2015   Absolute anemia 09/08/2015   Achilles bursitis 09/08/2015   Back pain, thoracic 09/08/2015   Acid reflux 09/08/2015   Bulge of cervical disc without myelopathy 09/08/2015   H/O 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 system disorder 09/08/2015   Hypertension associated with diabetes ([place]) 09/08/2015   Allergic rhinitis, seasonal 09/08/2015   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  Pre-operative Diagnosis: Sebaceous cyst right upper back, 3.8 cm.  Post-operative Diagnosis: same.    Surgeon: Ronny Bacon, M.D., FACS  Anesthesia: Local   Findings: Expected, cyst removed in its entirety, salvaging as much skin as feasible.  Estimated Blood Loss: 3 mL         Specimens: Sebaceous cyst          Complications: none              Procedure Details  The patient was evaluated, the benefits, complications, treatment options, and expected outcomes were discussed with the patient. The risks of bleeding, infection, recurrence of symptoms, failure to resolve symptoms, unanticipated injury, any of which could require further surgery were reviewed with the patient. The likelihood of  improving the patient's symptoms with return to their baseline status is expected.  The patient and/or family concurred with the proposed plan, giving informed consent.  The patient was taken to our procedure room, identified and the procedure verified.    The patient was positioned in the prone position and the right upper back area was prepped with  Chloraprep and draped in the sterile fashion.  A Time Out was held and the above information confirmed. Local infiltration with 1% lidocaine with epinephrine is applied to adequate anesthetic effect.  An elliptical excision is designed vertically along the skin lines.  This would remove the dermal punctum and all the underlying cyst.  I was able to preserve the adjacent skin to the most part.  Once this cyst was completely excised from the underlying soft tissues it was sent to specimen.  Adequate hemostasis was obtained.  The wound was irrigated.  And the wound was closed with interrupted vertical mattress sutures of 3-0  Ethilon.  Sterile dry dressings applied.  The patient tolerated procedure well.  Face-to-face time spent with the patient and accompanying care providers(if present) was 45 minutes, with more than 50% of the time spent counseling, educating, and coordinating care of the patient.    These notes generated with voice recognition software. I apologize for typographical errors.  Ronny Bacon M.D., FACS 12/30/2022, 4:58 PM

## 2022-12-29 ENCOUNTER — Other Ambulatory Visit: Payer: Self-pay | Admitting: Surgery

## 2022-12-29 ENCOUNTER — Encounter: Payer: Self-pay | Admitting: Surgery

## 2022-12-29 ENCOUNTER — Ambulatory Visit (INDEPENDENT_AMBULATORY_CARE_PROVIDER_SITE_OTHER): Payer: BC Managed Care – PPO | Admitting: Surgery

## 2022-12-29 VITALS — BP 153/88 | HR 96 | Temp 98.4°F | Ht 66.0 in | Wt 280.0 lb

## 2022-12-29 DIAGNOSIS — L723 Sebaceous cyst: Secondary | ICD-10-CM

## 2022-12-29 DIAGNOSIS — L720 Epidermal cyst: Secondary | ICD-10-CM | POA: Diagnosis not present

## 2022-12-29 NOTE — Patient Instructions (Signed)
If you have any concerns or questions, please feel free to call our office. See follow up appointment below.  Excision of Skin Lesions, Care After The following information offers guidance on how to care for yourself after your procedure. Your health care provider may also give you more specific instructions. If you have problems or questions, contact your health care provider. What can I expect after the procedure? After your procedure, it is common to have: Soreness or mild pain. Some redness and swelling. Follow these instructions at home: Excision site care  Follow instructions from your health care provider about how to take care of your excision site. Make sure you: Wash your hands with soap and water for at least 20 seconds before and after you change your bandage (dressing). If soap and water are not available, use hand sanitizer. Change your dressing as told by your health care provider. Leave stitches (sutures), skin glue, or adhesive strips in place. These skin closures may need to stay in place for 2 weeks or longer. If adhesive strip edges start to loosen and curl up, you may trim the loose edges. Do not remove adhesive strips completely unless your health care provider tells you to do that. Check the excision area every day for signs of infection. Watch for: More redness, swelling, or pain. Fluid or blood. Warmth. Pus or a bad smell. Keep the site clean, dry, and protected for at least 48 hours. For bleeding, apply gentle but firm pressure to the area using a folded towel for 20 minutes. Do not take baths, swim, or use a hot tub until your health care provider approves. Ask your health care provider if you may take showers. You may only be allowed to take sponge baths. General instructions Take over-the-counter and prescription medicines only as told by your health care provider. Follow instructions from your health care provider about how to minimize scarring. Scarring should  lessen over time. Avoid sun exposure until the area has healed. Use sunscreen to protect the area from the sun after it has healed. Avoid high-impact exercise and activities until the sutures are removed or the area heals. Keep all follow-up visits. This is important. Contact a health care provider if: You have more redness, swelling, or pain around your excision site. You have fluid or blood coming from your excision site. Your excision site feels warm to the touch. You have pus or a bad smell coming from your excision site. You have a fever. You have pain that does not improve in 2-3 days after your procedure. Get help right away if: You have bleeding that does not stop with pressure or a dressing. Your wound opens up. Summary Take over-the-counter and prescription medicines only as told by your health care provider. Change your dressing as told by your health care provider. Contact a health care provider if you have redness, swelling, pain, or other signs of infection around your excision site. Keep all follow-up visits. This is important. This information is not intended to replace advice given to you by your health care provider. Make sure you discuss any questions you have with your health care provider. Document Revised: 06/29/2021 Document Reviewed: 06/29/2021 Elsevier Patient Education  2023 Elsevier Inc.  

## 2023-01-05 DIAGNOSIS — H02883 Meibomian gland dysfunction of right eye, unspecified eyelid: Secondary | ICD-10-CM | POA: Diagnosis not present

## 2023-01-06 ENCOUNTER — Other Ambulatory Visit: Payer: Self-pay | Admitting: Podiatry

## 2023-01-06 ENCOUNTER — Other Ambulatory Visit: Payer: Self-pay | Admitting: Family Medicine

## 2023-01-06 DIAGNOSIS — I10 Essential (primary) hypertension: Secondary | ICD-10-CM

## 2023-01-10 ENCOUNTER — Ambulatory Visit (INDEPENDENT_AMBULATORY_CARE_PROVIDER_SITE_OTHER): Payer: BC Managed Care – PPO | Admitting: Physician Assistant

## 2023-01-10 ENCOUNTER — Encounter: Payer: Self-pay | Admitting: Physician Assistant

## 2023-01-10 VITALS — BP 131/77 | HR 80 | Temp 98.0°F | Ht 66.0 in | Wt 280.0 lb

## 2023-01-10 DIAGNOSIS — L723 Sebaceous cyst: Secondary | ICD-10-CM | POA: Diagnosis not present

## 2023-01-10 DIAGNOSIS — Z09 Encounter for follow-up examination after completed treatment for conditions other than malignant neoplasm: Secondary | ICD-10-CM

## 2023-01-10 NOTE — Patient Instructions (Signed)
   Follow-up with our office as needed.  Please call and ask to speak with a nurse if you develop questions or concerns.  

## 2023-01-10 NOTE — Progress Notes (Signed)
[facility] POST-OP OFFICE VISIT  01/10/2023  HPI: Justin Soto is a 62 y.o. male 12 days s/p excision of right upper back cyst with Dr Christian Mate   Doing well Soreness but improved No fever, chills Incision is well healed; no drainage  Vital signs: BP 131/77   Pulse 80   Temp 98 F (36.7 C)   Ht '5\' 6"'$  (1.676 m)   Wt 280 lb (127 kg)   SpO2 98%   BMI 45.19 kg/m    Physical Exam: Constitutional: Well appearing male, NAD Skin: Incision to mid upper back on the right; there are 4 nylon sutures present, skin has started to scar over these. I was able to remove them. No erythema or drainage   Assessment/Plan: This is a 62 y.o. male 12 days s/p excision of right upper back cyst with Dr Christian Mate    - Sutures removed; dressing placed  - Reviewed surgical pathology; Epidermoid Cyst   - He can follow up on as needed basis; He understands to call with questions/concerns  -- Edison Simon, PA-C [facility] 01/10/2023, 2:12 PM M-F: 7am - 4pm

## 2023-01-16 ENCOUNTER — Other Ambulatory Visit: Payer: BC Managed Care – PPO

## 2023-01-19 ENCOUNTER — Ambulatory Visit: Payer: BC Managed Care – PPO | Admitting: Urology

## 2023-01-23 DIAGNOSIS — E782 Mixed hyperlipidemia: Secondary | ICD-10-CM | POA: Diagnosis not present

## 2023-01-23 DIAGNOSIS — I10 Essential (primary) hypertension: Secondary | ICD-10-CM | POA: Diagnosis not present

## 2023-01-25 DIAGNOSIS — G4733 Obstructive sleep apnea (adult) (pediatric): Secondary | ICD-10-CM | POA: Diagnosis not present

## 2023-02-03 ENCOUNTER — Other Ambulatory Visit: Payer: Self-pay | Admitting: Podiatry

## 2023-02-23 DIAGNOSIS — G4733 Obstructive sleep apnea (adult) (pediatric): Secondary | ICD-10-CM | POA: Diagnosis not present

## 2023-03-03 DIAGNOSIS — M5412 Radiculopathy, cervical region: Secondary | ICD-10-CM | POA: Diagnosis not present

## 2023-03-03 DIAGNOSIS — M47812 Spondylosis without myelopathy or radiculopathy, cervical region: Secondary | ICD-10-CM | POA: Diagnosis not present

## 2023-03-03 DIAGNOSIS — M4317 Spondylolisthesis, lumbosacral region: Secondary | ICD-10-CM | POA: Diagnosis not present

## 2023-03-09 DIAGNOSIS — D3132 Benign neoplasm of left choroid: Secondary | ICD-10-CM | POA: Diagnosis not present

## 2023-03-09 DIAGNOSIS — H2513 Age-related nuclear cataract, bilateral: Secondary | ICD-10-CM | POA: Diagnosis not present

## 2023-03-09 DIAGNOSIS — E119 Type 2 diabetes mellitus without complications: Secondary | ICD-10-CM | POA: Diagnosis not present

## 2023-03-09 LAB — HM DIABETES EYE EXAM

## 2023-03-16 ENCOUNTER — Other Ambulatory Visit: Payer: Self-pay | Admitting: Family Medicine

## 2023-03-16 ENCOUNTER — Other Ambulatory Visit: Payer: Self-pay | Admitting: Podiatry

## 2023-03-16 DIAGNOSIS — I10 Essential (primary) hypertension: Secondary | ICD-10-CM

## 2023-03-26 DIAGNOSIS — G4733 Obstructive sleep apnea (adult) (pediatric): Secondary | ICD-10-CM | POA: Diagnosis not present

## 2023-04-13 ENCOUNTER — Other Ambulatory Visit: Payer: Self-pay | Admitting: Urology

## 2023-04-13 ENCOUNTER — Other Ambulatory Visit: Payer: Self-pay | Admitting: Podiatry

## 2023-04-18 DIAGNOSIS — M5412 Radiculopathy, cervical region: Secondary | ICD-10-CM | POA: Diagnosis not present

## 2023-04-18 DIAGNOSIS — M47812 Spondylosis without myelopathy or radiculopathy, cervical region: Secondary | ICD-10-CM | POA: Diagnosis not present

## 2023-04-18 DIAGNOSIS — M4312 Spondylolisthesis, cervical region: Secondary | ICD-10-CM | POA: Diagnosis not present

## 2023-04-25 DIAGNOSIS — G4733 Obstructive sleep apnea (adult) (pediatric): Secondary | ICD-10-CM | POA: Diagnosis not present

## 2023-04-27 ENCOUNTER — Other Ambulatory Visit: Payer: Self-pay | Admitting: Student

## 2023-04-27 DIAGNOSIS — M4312 Spondylolisthesis, cervical region: Secondary | ICD-10-CM

## 2023-05-04 ENCOUNTER — Ambulatory Visit
Admission: RE | Admit: 2023-05-04 | Discharge: 2023-05-04 | Disposition: A | Discharge status: Home / Self Care | Payer: BC Managed Care – PPO | Source: Ambulatory Visit | Attending: Student | Admitting: Student

## 2023-05-04 DIAGNOSIS — M4312 Spondylolisthesis, cervical region: Secondary | ICD-10-CM | POA: Diagnosis not present

## 2023-05-04 DIAGNOSIS — M4802 Spinal stenosis, cervical region: Secondary | ICD-10-CM | POA: Diagnosis not present

## 2023-05-18 ENCOUNTER — Other Ambulatory Visit: Payer: Self-pay | Admitting: Podiatry

## 2023-05-19 NOTE — Telephone Encounter (Signed)
Patient will need appointment and new bloodwork if wanting to go back on oral terbinafine

## 2023-05-26 DIAGNOSIS — G4733 Obstructive sleep apnea (adult) (pediatric): Secondary | ICD-10-CM | POA: Diagnosis not present

## 2023-06-05 ENCOUNTER — Other Ambulatory Visit: Payer: Self-pay | Admitting: Urology

## 2023-06-05 ENCOUNTER — Other Ambulatory Visit: Payer: Self-pay | Admitting: Podiatry

## 2023-06-05 DIAGNOSIS — N401 Enlarged prostate with lower urinary tract symptoms: Secondary | ICD-10-CM

## 2023-06-24 DIAGNOSIS — G4733 Obstructive sleep apnea (adult) (pediatric): Secondary | ICD-10-CM | POA: Diagnosis not present

## 2023-06-30 ENCOUNTER — Ambulatory Visit: Payer: BC Managed Care – PPO | Admitting: Family Medicine

## 2023-06-30 ENCOUNTER — Encounter: Payer: Self-pay | Admitting: Family Medicine

## 2023-06-30 ENCOUNTER — Telehealth: Payer: Self-pay | Admitting: Family Medicine

## 2023-06-30 VITALS — BP 122/79 | HR 91 | Temp 98.9°F | Ht 65.0 in | Wt 270.0 lb

## 2023-06-30 DIAGNOSIS — E1159 Type 2 diabetes mellitus with other circulatory complications: Secondary | ICD-10-CM

## 2023-06-30 DIAGNOSIS — M2540 Effusion, unspecified joint: Secondary | ICD-10-CM

## 2023-06-30 DIAGNOSIS — M2560 Stiffness of unspecified joint, not elsewhere classified: Secondary | ICD-10-CM | POA: Diagnosis not present

## 2023-06-30 DIAGNOSIS — H6692 Otitis media, unspecified, left ear: Secondary | ICD-10-CM

## 2023-06-30 DIAGNOSIS — I152 Hypertension secondary to endocrine disorders: Secondary | ICD-10-CM | POA: Diagnosis not present

## 2023-06-30 MED ORDER — PREDNISONE 50 MG PO TABS
50.0000 mg | ORAL_TABLET | Freq: Every day | ORAL | 0 refills | Status: DC
Start: 2023-06-30 — End: 2023-09-29

## 2023-06-30 MED ORDER — OZEMPIC (0.25 OR 0.5 MG/DOSE) 2 MG/1.5ML ~~LOC~~ SOPN: 0.2500 mg | PEN_INJECTOR | SUBCUTANEOUS | 1 refills | Status: DC

## 2023-06-30 MED ORDER — AMOXICILLIN-POT CLAVULANATE 875-125 MG PO TABS
1.0000 | ORAL_TABLET | Freq: Two times a day (BID) | ORAL | 0 refills | Status: DC
Start: 2023-06-30 — End: 2023-07-25

## 2023-06-30 NOTE — Telephone Encounter (Signed)
Covermymeds is requesting prior authorization Key: [id number] Name: [place]ffer Ozempic 0.25 or 0.5 mg/dose 2mg /1.42ml pen injectors

## 2023-06-30 NOTE — Progress Notes (Unsigned)
Established patient visit   Patient: Justin Soto   DOB: 08-18-1961   62 y.o. Male  MRN: [id number] Visit Date: 06/30/2023  Today's healthcare provider: Sherlyn Hay, DO   Chief Complaint  Patient presents with  . joint pain    Multiple joint pain and stiffness.  Fingers, knees and shoulders.  States his hands get stiff and he is unable to straighten them.     Subjective    HPI HPI     joint pain    Additional comments: Multiple joint pain and stiffness.  Fingers, knees and shoulders.  States his hands get stiff and he is unable to straighten them.        Last edited by Adline Peals, CMA on 06/30/2023  2:06 PM.      Has to use palm to get things open Gradually getting worse  BGs have been into the 200s  Fell approx. 2 months ago; saw Dr. Lovell Sheehan Two fusions in neck - disc pushed forward in between the two fusions; goes next Thursday for f/u  Sleeps on left side; right arm hurts when sleeping. Back surgery done 10/2022  Rheum labs, then poss referral to rheum    Labs - had breakfast, sausage, egg and cheese croissant (jimmy deans)  ***  Medications: Outpatient Medications Prior to Visit  Medication Sig  . amLODipine-benazepril (LOTREL) 10-40 MG capsule TAKE 1 CAPSULE BY MOUTH EVERY DAY  . aspirin EC 81 MG tablet Take 81 mg by mouth daily.   Marland Kitchen atorvastatin (LIPITOR) 40 MG tablet TAKE 1 TABLET BY MOUTH EVERY DAY  . betamethasone dipropionate 0.05 % cream APPLY TOPICALLY TWICE A DAY  . cetirizine (ZYRTEC) 10 MG tablet TAKE ONE TO TWO TABLETS BY MOUTH EVERY DAY AS NEEDED. (Patient taking differently: Take 10 mg by mouth daily.)  . cyclobenzaprine (FLEXERIL) 10 MG tablet Take 1 tablet (10 mg total) by mouth at bedtime.  . dapagliflozin propanediol (FARXIGA) 10 MG TABS tablet TAKE 1 TABLET BY MOUTH EVERY DAY BEFORE BREAKFAST  . docusate sodium (COLACE) 100 MG capsule Take 1 capsule (100 mg total) by mouth 2 (two) times daily.  . finasteride (PROSCAR) 5 MG  tablet TAKE 1 TABLET (5 MG TOTAL) BY MOUTH DAILY.  . fluconazole (DIFLUCAN) 100 MG tablet Take 1 tablet (100 mg total) by mouth daily.  . fluticasone (FLONASE) 50 MCG/ACT nasal spray USE TWO SPRAYS IN EACH NOSTRIL EVERY DAY  . gabapentin (NEURONTIN) 300 MG capsule Take 600 mg by mouth 2 (two) times daily.  . hydrochlorothiazide (HYDRODIURIL) 25 MG tablet TAKE 1 TABLET BY MOUTH EVERY DAY  . metoprolol tartrate (LOPRESSOR) 25 MG tablet TAKE 1 TABLET (25 MG TOTAL) BY MOUTH DAILY.  . montelukast (SINGULAIR) 10 MG tablet TAKE 1 TABLET BY MOUTH EVERY DAY IN THE MORNING  . Multiple Vitamin (MULTIVITAMIN WITH MINERALS) TABS tablet Take 1 tablet by mouth daily.  . NON FORMULARY Pt uses a cpap nightly  . nystatin-triamcinolone ointment (MYCOLOG) Apply 1 Application topically 2 (two) times daily.  Marland Kitchen omeprazole (PRILOSEC) 20 MG capsule Take 20 mg by mouth daily.   . sertraline (ZOLOFT) 50 MG tablet Take 1 tablet (50 mg total) by mouth daily.  . tamsulosin (FLOMAX) 0.4 MG CAPS capsule TAKE 2 CAPSULES BY MOUTH EVERY DAY  . terbinafine (LAMISIL) 250 MG tablet Take 1 tablet (250 mg total) by mouth daily.   No facility-administered medications prior to visit.    Review of Systems  {Insert previous labs (  optional):23779}  {See past labs  Heme  Chem  Endocrine  Serology  Results Review (optional):1}   Objective    BP 122/79 (BP Location: Right Arm, Patient Position: Sitting, Cuff Size: Large)   Pulse 91   Temp 98.9 F (37.2 C) (Oral)   Ht 5\' 5"  (1.651 m)   Wt 270 lb (122.5 kg)   SpO2 96%   BMI 44.93 kg/m  {Insert last BP/Wt (optional):23777}  {See vitals history (optional):1}  Physical Exam  ***  No results found for any visits on 06/30/23.  Assessment & Plan     ***  No follow-ups on file.      {provider attestation***:1}   Sherlyn Hay, DO  [facility] [phone] (phone) [phone] (fax)  [facility]

## 2023-07-01 LAB — ANA 12PLUS PROFILE, DO ALL RDL

## 2023-07-01 LAB — COMPREHENSIVE METABOLIC PANEL
Alkaline Phosphatase: 77 IU/L (ref 44–121)
Bilirubin Total: 0.7 mg/dL (ref 0.0–1.2)
Creatinine, Ser: 0.88 mg/dL (ref 0.76–1.27)
Glucose: 159 mg/dL — ABNORMAL HIGH (ref 70–99)

## 2023-07-01 LAB — LIPID PANEL
Triglycerides: 366 mg/dL — ABNORMAL HIGH (ref 0–149)
VLDL Cholesterol Cal: 52 mg/dL — ABNORMAL HIGH (ref 5–40)

## 2023-07-01 LAB — HEMOGLOBIN A1C: Est. average glucose Bld gHb Est-mCnc: 163 mg/dL

## 2023-07-03 LAB — COMPREHENSIVE METABOLIC PANEL
Chloride: 95 mmol/L — ABNORMAL LOW (ref 96–106)
Sodium: 134 mmol/L (ref 134–144)
eGFR: 97 mL/min/{1.73_m2} (ref >59)

## 2023-07-03 LAB — ANA 12PLUS PROFILE, DO ALL RDL

## 2023-07-03 LAB — LIPID PANEL: Chol/HDL Ratio: 4 ratio (ref 0.0–5.0)

## 2023-07-04 DIAGNOSIS — M47812 Spondylosis without myelopathy or radiculopathy, cervical region: Secondary | ICD-10-CM | POA: Diagnosis not present

## 2023-07-04 DIAGNOSIS — M542 Cervicalgia: Secondary | ICD-10-CM | POA: Diagnosis not present

## 2023-07-05 LAB — LIPID PANEL
Cholesterol, Total: 103 mg/dL (ref 100–199)
HDL: 26 mg/dL — ABNORMAL LOW (ref >39)
LDL Chol Calc (NIH): 25 mg/dL (ref 0–99)

## 2023-07-05 LAB — COMPREHENSIVE METABOLIC PANEL
ALT: 45 IU/L — ABNORMAL HIGH (ref 0–44)
AST: 41 IU/L — ABNORMAL HIGH (ref 0–40)
BUN: 18 mg/dL (ref 8–27)
Potassium: 4.4 mmol/L (ref 3.5–5.2)

## 2023-07-05 LAB — ANA 12PLUS PROFILE, DO ALL RDL: Anti-Centromere Ab (RDL): <1:40 {titer}

## 2023-07-05 LAB — ANA TITER AND PATTERN: Speckled Pattern: 1:40 {titer} — ABNORMAL HIGH

## 2023-07-06 ENCOUNTER — Encounter: Payer: Self-pay | Admitting: Family Medicine

## 2023-07-06 DIAGNOSIS — H6692 Otitis media, unspecified, left ear: Secondary | ICD-10-CM | POA: Insufficient documentation

## 2023-07-06 DIAGNOSIS — M2560 Stiffness of unspecified joint, not elsewhere classified: Secondary | ICD-10-CM | POA: Insufficient documentation

## 2023-07-06 DIAGNOSIS — M2540 Effusion, unspecified joint: Secondary | ICD-10-CM | POA: Insufficient documentation

## 2023-07-06 LAB — COMPREHENSIVE METABOLIC PANEL
Albumin: 4.7 g/dL (ref 3.9–4.9)
BUN/Creatinine Ratio: 20 (ref 10–24)
CO2: 22 mmol/L (ref 20–29)
Calcium: 9.7 mg/dL (ref 8.6–10.2)
Globulin, Total: 3.7 g/dL (ref 1.5–4.5)
Total Protein: 8.4 g/dL (ref 6.0–8.5)

## 2023-07-06 LAB — ANA 12PLUS PROFILE, DO ALL RDL
Anti-TPO Ab (RDL): <9 IU/mL (ref <9.0)
C3 Complement (RDL): 160 mg/dL (ref 82–167)
C4 Complement (RDL): 19 mg/dL (ref 14–44)

## 2023-07-06 LAB — HEMOGLOBIN A1C: Hgb A1c MFr Bld: 7.3 % — ABNORMAL HIGH (ref 4.8–5.6)

## 2023-07-06 NOTE — Assessment & Plan Note (Signed)
Patient endorses acute ear pain during this visit with notable effusion behind the tympanic membrane.  Will go ahead and prescribe Augmentin as noted below.

## 2023-07-06 NOTE — Assessment & Plan Note (Addendum)
Given patient's reported elevated blood glucoses, we will go ahead and start him on Ozempic as noted below.  Will check routine lab work as noted below as well. Patient's blood pressure is well-controlled at this time.  No changes to his antihypertensive regimen.

## 2023-07-06 NOTE — Telephone Encounter (Signed)
Patient called to let Dr. Payton Mccallum know his blood sugar has been running 300 for the past 3 days. Patient is on Prednisone and wanted to know if that could be the reason.

## 2023-07-06 NOTE — Assessment & Plan Note (Addendum)
Patient's concerns are fairly consistent with rheumatoid arthritis.  Will check rheumatological labs as noted below.  If any of this is positive, we will go ahead and refer patient to rheumatology. Given the significant pain he is having, we will give him a short burst of prednisone to help alleviate this until we have his blood work back and he is potentially able to follow-up with rheumatology.  Did discuss with patient that prednisone will cause an increase in his blood sugars; he still wanted to proceed with it due to his high level of pain/discomfort.

## 2023-07-10 NOTE — Telephone Encounter (Signed)
Etta Quill (Key: [id number]) Rx #: [id number] Ozempic (0.25 or 0.5 MG/DOSE) 2MG /1.5ML pen-injectors Approved. . Authorization Expiration Date: July 03, 2024. [facility] pharmacy advised.

## 2023-07-12 DIAGNOSIS — M5412 Radiculopathy, cervical region: Secondary | ICD-10-CM | POA: Diagnosis not present

## 2023-07-17 ENCOUNTER — Other Ambulatory Visit: Payer: Self-pay | Admitting: Physician Assistant

## 2023-07-17 DIAGNOSIS — I10 Essential (primary) hypertension: Secondary | ICD-10-CM

## 2023-07-25 ENCOUNTER — Ambulatory Visit: Payer: BC Managed Care – PPO | Admitting: Family Medicine

## 2023-07-25 ENCOUNTER — Encounter: Payer: Self-pay | Admitting: Family Medicine

## 2023-07-25 VITALS — BP 118/82 | HR 97 | Temp 98.4°F | Ht 65.0 in | Wt 273.0 lb

## 2023-07-25 DIAGNOSIS — I152 Hypertension secondary to endocrine disorders: Secondary | ICD-10-CM

## 2023-07-25 DIAGNOSIS — E785 Hyperlipidemia, unspecified: Secondary | ICD-10-CM

## 2023-07-25 DIAGNOSIS — C61 Malignant neoplasm of prostate: Secondary | ICD-10-CM

## 2023-07-25 DIAGNOSIS — E1169 Type 2 diabetes mellitus with other specified complication: Secondary | ICD-10-CM

## 2023-07-25 DIAGNOSIS — D509 Iron deficiency anemia, unspecified: Secondary | ICD-10-CM

## 2023-07-25 DIAGNOSIS — R748 Abnormal levels of other serum enzymes: Secondary | ICD-10-CM

## 2023-07-25 DIAGNOSIS — E1159 Type 2 diabetes mellitus with other circulatory complications: Secondary | ICD-10-CM

## 2023-07-25 DIAGNOSIS — E1165 Type 2 diabetes mellitus with hyperglycemia: Secondary | ICD-10-CM

## 2023-07-25 DIAGNOSIS — G4733 Obstructive sleep apnea (adult) (pediatric): Secondary | ICD-10-CM | POA: Diagnosis not present

## 2023-07-25 DIAGNOSIS — K2900 Acute gastritis without bleeding: Secondary | ICD-10-CM | POA: Diagnosis not present

## 2023-07-25 NOTE — Assessment & Plan Note (Signed)
Chronic, goal <7 Continue to recommend balanced, lower carb meals. Smaller meal size, adding snacks. Choosing water as drink of choice and increasing purposeful exercise. Defer medication changes at this time

## 2023-07-25 NOTE — Progress Notes (Signed)
Established patient visit   Patient: Justin Soto   DOB: [birth year]   62 y.o. Male  MRN: [id number] Visit Date: 07/25/2023  Today's healthcare provider: Jacky Kindle, FNP  Introduced to nurse practitioner role and practice setting.  All questions answered.  Discussed provider/patient relationship and expectations.  Subjective    Abdominal Pain   HPI     Abdominal Pain    Additional comments: Patient had 3 weeks of loose stools and then he developed abdominal pain.  During that time he has also had increased indigestion.  He has been on Ozempic for 2 weeks now and notes that he is now constipated.        Last edited by Adline Peals, CMA on 07/25/2023  8:36 AM.      Medications: Outpatient Medications Prior to Visit  Medication Sig   amLODipine-benazepril (LOTREL) 10-40 MG capsule TAKE 1 CAPSULE BY MOUTH EVERY DAY   aspirin EC 81 MG tablet Take 81 mg by mouth daily.    atorvastatin (LIPITOR) 40 MG tablet TAKE 1 TABLET BY MOUTH EVERY DAY   betamethasone dipropionate 0.05 % cream APPLY TOPICALLY TWICE A DAY   cetirizine (ZYRTEC) 10 MG tablet TAKE ONE TO TWO TABLETS BY MOUTH EVERY DAY AS NEEDED. (Patient taking differently: Take 10 mg by mouth daily.)   cyclobenzaprine (FLEXERIL) 10 MG tablet Take 1 tablet (10 mg total) by mouth at bedtime.   dapagliflozin propanediol (FARXIGA) 10 MG TABS tablet TAKE 1 TABLET BY MOUTH EVERY DAY BEFORE BREAKFAST   docusate sodium (COLACE) 100 MG capsule Take 1 capsule (100 mg total) by mouth 2 (two) times daily.   finasteride (PROSCAR) 5 MG tablet TAKE 1 TABLET (5 MG TOTAL) BY MOUTH DAILY.   fluconazole (DIFLUCAN) 100 MG tablet Take 1 tablet (100 mg total) by mouth daily.   fluticasone (FLONASE) 50 MCG/ACT nasal spray USE TWO SPRAYS IN EACH NOSTRIL EVERY DAY   gabapentin (NEURONTIN) 300 MG capsule Take 600 mg by mouth 2 (two) times daily.   hydrochlorothiazide (HYDRODIURIL) 25 MG tablet TAKE 1 TABLET BY MOUTH EVERY DAY   metoprolol tartrate  (LOPRESSOR) 25 MG tablet TAKE 1 TABLET (25 MG TOTAL) BY MOUTH DAILY.   montelukast (SINGULAIR) 10 MG tablet TAKE 1 TABLET BY MOUTH EVERY DAY IN THE MORNING   Multiple Vitamin (MULTIVITAMIN WITH MINERALS) TABS tablet Take 1 tablet by mouth daily.   NON FORMULARY Pt uses a cpap nightly   nystatin-triamcinolone ointment (MYCOLOG) Apply 1 Application topically 2 (two) times daily.   omeprazole (PRILOSEC) 20 MG capsule Take 20 mg by mouth daily.    predniSONE (DELTASONE) 50 MG tablet Take 1 tablet (50 mg total) by mouth daily with breakfast.   Semaglutide,0.25 or 0.5MG /DOS, (OZEMPIC, 0.25 OR 0.5 MG/DOSE,) 2 MG/1.5ML SOPN Inject 0.25 mg into the skin once a week.   sertraline (ZOLOFT) 50 MG tablet Take 1 tablet (50 mg total) by mouth daily.   tamsulosin (FLOMAX) 0.4 MG CAPS capsule TAKE 2 CAPSULES BY MOUTH EVERY DAY   terbinafine (LAMISIL) 250 MG tablet Take 1 tablet (250 mg total) by mouth daily.   [DISCONTINUED] amoxicillin-clavulanate (AUGMENTIN) 875-125 MG tablet Take 1 tablet by mouth 2 (two) times daily.   No facility-administered medications prior to visit.    Review of Systems  Gastrointestinal:  Positive for abdominal pain.     Objective    BP 118/82 (BP Location: Right Arm, Patient Position: Sitting, Cuff Size: Large)   Pulse 97   Temp 98.4  F (36.9 C) (Oral)   Ht 5\' 5"  (1.651 m)   Wt 273 lb (123.8 kg)   SpO2 100%   BMI 45.43 kg/m   Physical Exam Vitals and nursing note reviewed.  Constitutional:      General: He is not in acute distress.    Appearance: Normal appearance. He is well-developed. He is obese. He is not ill-appearing, toxic-appearing or diaphoretic.  HENT:     Head: Normocephalic and atraumatic.  Eyes:     Pupils: Pupils are equal, round, and reactive to light.  Cardiovascular:     Rate and Rhythm: Normal rate and regular rhythm.     Pulses: Normal pulses.     Heart sounds: Normal heart sounds.  Pulmonary:     Effort: Pulmonary effort is normal.      Breath sounds: Normal breath sounds.  Abdominal:     General: Bowel sounds are normal. There is distension.     Palpations: Abdomen is soft.     Tenderness: There is no abdominal tenderness.     Comments: Rounded, obese abdomnen  Musculoskeletal:        General: Normal range of motion.     Cervical back: Normal range of motion.  Skin:    General: Skin is warm and dry.     Capillary Refill: Capillary refill takes less than 2 seconds.  Neurological:     General: No focal deficit present.     Mental Status: He is alert and oriented to person, place, and time. Mental status is at baseline.  Psychiatric:        Mood and Affect: Mood normal.        Behavior: Behavior normal.     No results found for any visits on 07/25/23.  Assessment & Plan     Problem List Items Addressed This Visit       Cardiovascular and Mediastinum   Hypertension associated with diabetes (HCC)    Chronic, borderline Goal 129/79 Continue diet/exercise Continue combo lotrel 10-40 and farxiga 10, hydrochlorothiazide 25       Relevant Orders   CBC with Differential/Platelet   Comprehensive Metabolic Panel (CMET)     Digestive   Acute gastritis without hemorrhage - Primary    Acute, <1 month Repeat labs Has transitioned from loose to constipated iso ozempic use       Relevant Orders   CBC with Differential/Platelet   Comprehensive Metabolic Panel (CMET)     Endocrine   Hyperlipidemia associated with type 2 diabetes mellitus (HCC)    Chronic, stable LDL goal <70 recommend diet low in saturated fat and regular exercise - 30 min at least 5 times per week Remains on lipitor 40       Uncontrolled type 2 diabetes mellitus with hyperglycemia (HCC)    Chronic, goal <7 Continue to recommend balanced, lower carb meals. Smaller meal size, adding snacks. Choosing water as drink of choice and increasing purposeful exercise. Defer medication changes at this time        Genitourinary   Prostate cancer  [facility])    Chronic, followed by urology Repeat PSA No complaints Remains on proscar 5 daily and flomax 0.8 mg       Relevant Orders   PSA     Other   Absolute anemia    Chronic, repeat CBC given complaints of hemorrhoids s/s constipation/straining as well as iron panel       Relevant Orders   Iron, TIBC and Ferritin Panel   CBC with  Differential/Platelet   Elevated liver enzymes    Repeat CMP given c/o abdominal pain and new medication start with GI complaints       Relevant Orders   Comprehensive Metabolic Panel (CMET)   Morbid obesity (HCC)    Chronic, Body mass index is 45.43 kg/m. Discussed importance of healthy weight management Discussed diet and exercise        Return in about 8 weeks (around 09/19/2023), or if symptoms worsen or fail to improve, for chonic disease management.      Leilani Merl, FNP, have reviewed all documentation for this visit. The documentation on 07/25/23 for the exam, diagnosis, procedures, and orders are all accurate and complete.  Jacky Kindle, FNP  [facility] [phone] (phone) [phone] (fax)  [facility]

## 2023-07-25 NOTE — Assessment & Plan Note (Signed)
Chronic, stable LDL goal <70 recommend diet low in saturated fat and regular exercise - 30 min at least 5 times per week Remains on lipitor 40

## 2023-07-25 NOTE — Assessment & Plan Note (Signed)
Chronic, followed by urology Repeat PSA No complaints Remains on proscar 5 daily and flomax 0.8 mg

## 2023-07-25 NOTE — Assessment & Plan Note (Signed)
Chronic, borderline Goal 129/79 Continue diet/exercise Continue combo lotrel 10-40 and farxiga 10, hydrochlorothiazide 25

## 2023-07-25 NOTE — Patient Instructions (Addendum)
The CDC recommends two doses of Shingrix (the new shingles vaccine) separated by 2 to 6 months for adults age 64 years and older. I recommend checking with your insurance plan regarding coverage for this vaccine.    Please perform your diabetic eye exam annually   Trial of Metamucil to assist with drug related constipation

## 2023-07-25 NOTE — Assessment & Plan Note (Signed)
Chronic, repeat CBC given complaints of hemorrhoids s/s constipation/straining as well as iron panel

## 2023-07-25 NOTE — Assessment & Plan Note (Signed)
Acute, <1 month Repeat labs Has transitioned from loose to constipated iso ozempic use

## 2023-07-25 NOTE — Assessment & Plan Note (Signed)
Repeat CMP given c/o abdominal pain and new medication start with GI complaints

## 2023-07-25 NOTE — Assessment & Plan Note (Signed)
Chronic, Body mass index is 45.43 kg/m. Discussed importance of healthy weight management Discussed diet and exercise

## 2023-07-26 NOTE — Progress Notes (Signed)
Slightly worse hemoglobin; continue to monitor anemia with PCP.

## 2023-08-01 ENCOUNTER — Other Ambulatory Visit: Payer: Self-pay | Admitting: Physician Assistant

## 2023-08-02 ENCOUNTER — Encounter: Payer: Self-pay | Admitting: Family Medicine

## 2023-08-02 ENCOUNTER — Telehealth (INDEPENDENT_AMBULATORY_CARE_PROVIDER_SITE_OTHER): Payer: BC Managed Care – PPO | Admitting: Family Medicine

## 2023-08-02 DIAGNOSIS — U071 COVID-19: Secondary | ICD-10-CM | POA: Diagnosis not present

## 2023-08-02 MED ORDER — BENZONATATE 100 MG PO CAPS
100.0000 mg | ORAL_CAPSULE | Freq: Three times a day (TID) | ORAL | 0 refills | Status: DC | PRN
Start: 2023-08-02 — End: 2023-09-29

## 2023-08-02 MED ORDER — NIRMATRELVIR/RITONAVIR (PAXLOVID)TABLET
3.0000 | ORAL_TABLET | Freq: Two times a day (BID) | ORAL | 0 refills | Status: AC
Start: 2023-08-02 — End: 2023-08-07

## 2023-08-02 MED ORDER — ALBUTEROL SULFATE HFA 108 (90 BASE) MCG/ACT IN AERS
1.0000 | INHALATION_SPRAY | Freq: Four times a day (QID) | RESPIRATORY_TRACT | 0 refills | Status: DC | PRN
Start: 2023-08-02 — End: 2023-09-29

## 2023-08-02 NOTE — Progress Notes (Signed)
MyChart Video Visit    Virtual Visit via Video Note   This format is felt to be most appropriate for this patient at this time. Physical exam was limited by quality of the video and audio technology used for the visit.   Patient location: Home Provider location: [facility] [address]  I discussed the limitations of evaluation and management by telemedicine and the availability of in person appointments. The patient expressed understanding and agreed to proceed.  Patient: Justin Soto   DOB: 1961/07/27   62 y.o. Male  MRN: [id number] Visit Date: 08/02/2023  Today's healthcare provider: Sherlyn Hay, DO   Chief Complaint  Patient presents with   Covid Positive    Patient reports testing positive for Covid yesterday. Reports symptoms started late Monday worsening yesterday.   Subjective    HPI HPI     Covid Positive    Additional comments: Patient reports testing positive for Covid yesterday. Reports symptoms started late Monday worsening yesterday.      Last edited by Marjie Skiff, CMA on 08/02/2023  9:43 AM.      Patient notes that he started having congestion Monday evening, which worsened going into Tuesday, so he bought a home COVID test at [facility] and tested positive. - His wife's sister had gone to his wife's work and later found out that she was COVID-positive.  He also works at [employer], so he may have gotten COVID from either of those places. - He notes he has been having a lot of congestion, both in his head and chest as well as a bad headache.   - His stomach was hurting him some this morning  He has tried: - some tylenol for the headache - Nyquil for cough, which he describes as a hacking cough  No fever Felt chillier than normal last night. This is the 3rd time he has had COVID, worst time Some shortness of breath Some nausea    Medications: Outpatient Medications Prior to Visit  Medication Sig   amLODipine-benazepril (LOTREL)  10-40 MG capsule TAKE 1 CAPSULE BY MOUTH EVERY DAY   aspirin EC 81 MG tablet Take 81 mg by mouth daily.    atorvastatin (LIPITOR) 40 MG tablet TAKE 1 TABLET BY MOUTH EVERY DAY   betamethasone dipropionate 0.05 % cream APPLY TOPICALLY TWICE A DAY   cetirizine (ZYRTEC) 10 MG tablet TAKE ONE TO TWO TABLETS BY MOUTH EVERY DAY AS NEEDED. (Patient taking differently: Take 10 mg by mouth daily.)   cyclobenzaprine (FLEXERIL) 10 MG tablet Take 1 tablet (10 mg total) by mouth at bedtime.   dapagliflozin propanediol (FARXIGA) 10 MG TABS tablet TAKE 1 TABLET BY MOUTH EVERY DAY BEFORE BREAKFAST   docusate sodium (COLACE) 100 MG capsule Take 1 capsule (100 mg total) by mouth 2 (two) times daily.   finasteride (PROSCAR) 5 MG tablet TAKE 1 TABLET (5 MG TOTAL) BY MOUTH DAILY.   fluconazole (DIFLUCAN) 100 MG tablet Take 1 tablet (100 mg total) by mouth daily.   fluticasone (FLONASE) 50 MCG/ACT nasal spray USE TWO SPRAYS IN EACH NOSTRIL EVERY DAY   gabapentin (NEURONTIN) 300 MG capsule Take 600 mg by mouth 2 (two) times daily.   hydrochlorothiazide (HYDRODIURIL) 25 MG tablet TAKE 1 TABLET BY MOUTH EVERY DAY   metoprolol tartrate (LOPRESSOR) 25 MG tablet TAKE 1 TABLET (25 MG TOTAL) BY MOUTH DAILY.   montelukast (SINGULAIR) 10 MG tablet TAKE 1 TABLET BY MOUTH EVERY DAY IN THE MORNING  Multiple Vitamin (MULTIVITAMIN WITH MINERALS) TABS tablet Take 1 tablet by mouth daily.   NON FORMULARY Pt uses a cpap nightly   nystatin-triamcinolone ointment (MYCOLOG) Apply 1 Application topically 2 (two) times daily.   omeprazole (PRILOSEC) 20 MG capsule Take 20 mg by mouth daily.    predniSONE (DELTASONE) 50 MG tablet Take 1 tablet (50 mg total) by mouth daily with breakfast.   Semaglutide,0.25 or 0.5MG /DOS, (OZEMPIC, 0.25 OR 0.5 MG/DOSE,) 2 MG/1.5ML SOPN Inject 0.25 mg into the skin once a week.   sertraline (ZOLOFT) 50 MG tablet Take 1 tablet (50 mg total) by mouth daily.   tamsulosin (FLOMAX) 0.4 MG CAPS capsule TAKE 2  CAPSULES BY MOUTH EVERY DAY   terbinafine (LAMISIL) 250 MG tablet Take 1 tablet (250 mg total) by mouth daily.   No facility-administered medications prior to visit.    Review of Systems  Constitutional:  Positive for chills. Negative for diaphoresis and fever.  HENT:  Positive for congestion.   Respiratory:  Positive for cough and shortness of breath. Negative for choking, chest tightness and wheezing.   Cardiovascular:  Negative for chest pain and palpitations.  Gastrointestinal:  Positive for abdominal pain. Negative for constipation, diarrhea, nausea and vomiting.  Musculoskeletal:  Negative for myalgias.  Neurological:  Positive for headaches. Negative for weakness.        Objective    There were no vitals taken for this visit.      Physical Exam Constitutional:      General: He is not in acute distress.    Appearance: Normal appearance. He is not diaphoretic.  HENT:     Head: Normocephalic.  Eyes:     Conjunctiva/sclera: Conjunctivae normal.  Pulmonary:     Effort: Pulmonary effort is normal. No respiratory distress.  Neurological:     Mental Status: He is alert and oriented to person, place, and time. Mental status is at baseline.        Assessment & Plan    Positive self-administered antigen test for COVID-19 -     Benzonatate; Take 1 capsule (100 mg total) by mouth 3 (three) times daily as needed for cough.  Dispense: 30 capsule; Refill: 0 -     nirmatrelvir/ritonavir; Take 3 tablets by mouth 2 (two) times daily for 5 days. (Take nirmatrelvir 150 mg two tablets twice daily for 5 days and ritonavir 100 mg one tablet twice daily for 5 days) Patient GFR is 100  Dispense: 30 tablet; Refill: 0 -     Albuterol Sulfate HFA; Inhale 1-2 puffs into the lungs every 6 (six) hours as needed for shortness of breath.  Dispense: 1 each; Refill: 0   Patient has multiple risk factors for complicated course of illness.  Will start Paxlovid as noted.  Prescribed benzonatate to  address patient's cough, as well as albuterol for patient's shortness of breath to be used as needed.  Encouraged patient to continue supportive measures at home and isolate himself is much as possible; instructions given in AVS.   Return if symptoms worsen or fail to improve.     I discussed the assessment and treatment plan with the patient. The patient was provided an opportunity to ask questions and all were answered. The patient agreed with the plan and demonstrated an understanding of the instructions.   The patient was advised to call back or seek an in-person evaluation if the symptoms worsen or if the condition fails to improve as anticipated.  I provided 15 minutes of virtual-face-to-face time  during this encounter.  I discussed the assessment and treatment plan with the patient  The patient was provided an opportunity to ask questions and all were answered. The patient agreed with the plan and demonstrated an understanding of the instructions.   The patient was advised to call back or seek an in-person evaluation if the symptoms worsen or if the condition fails to improve as anticipated.   Sherlyn Hay, DO [facility] [phone] (phone) [phone] (fax)  [facility]

## 2023-08-02 NOTE — Patient Instructions (Addendum)
 You can go back to your normal activities when, for at least 24 hours, both are true:  Your symptoms are getting better overall, and  You have not had a fever (and are not using fever-reducing medication).  When you go back to your normal activities, take added precaution over the next 5 days, such as taking additional steps for cleaner air, hygiene, masks, physical distancing, and/or testing when you will be around other people indoors. This is especially important to protect people with factors that increase their risk of severe illness from respiratory viruses.  Keep in mind that you may still be able to spread the virus that made you sick, even if you are feeling better. You are likely to be less contagious at this time, depending on factors like how long you were sick or how sick you were.  If you develop a fever or you start to feel worse after you have gone back to normal activities, stay home and away from others again until, for at least 24 hours, both are true: your symptoms are improving overall, and you have not had a fever (and are not using fever-reducing medication). Then take added precaution for the next 5 days.

## 2023-08-17 DIAGNOSIS — M2550 Pain in unspecified joint: Secondary | ICD-10-CM | POA: Diagnosis not present

## 2023-08-17 DIAGNOSIS — M6530 Trigger finger, unspecified finger: Secondary | ICD-10-CM | POA: Diagnosis not present

## 2023-08-17 DIAGNOSIS — M1812 Unilateral primary osteoarthritis of first carpometacarpal joint, left hand: Secondary | ICD-10-CM | POA: Diagnosis not present

## 2023-08-17 DIAGNOSIS — M2560 Stiffness of unspecified joint, not elsewhere classified: Secondary | ICD-10-CM | POA: Diagnosis not present

## 2023-08-17 DIAGNOSIS — M79641 Pain in right hand: Secondary | ICD-10-CM | POA: Diagnosis not present

## 2023-08-17 DIAGNOSIS — M19041 Primary osteoarthritis, right hand: Secondary | ICD-10-CM | POA: Diagnosis not present

## 2023-08-17 DIAGNOSIS — M79642 Pain in left hand: Secondary | ICD-10-CM | POA: Diagnosis not present

## 2023-08-24 ENCOUNTER — Other Ambulatory Visit: Payer: Self-pay | Admitting: Family Medicine

## 2023-08-24 DIAGNOSIS — U071 COVID-19: Secondary | ICD-10-CM

## 2023-08-25 DIAGNOSIS — G4733 Obstructive sleep apnea (adult) (pediatric): Secondary | ICD-10-CM | POA: Diagnosis not present

## 2023-08-28 ENCOUNTER — Encounter: Payer: Self-pay | Admitting: Oncology

## 2023-08-28 ENCOUNTER — Inpatient Hospital Stay: Payer: BC Managed Care – PPO

## 2023-08-28 ENCOUNTER — Inpatient Hospital Stay: Payer: BC Managed Care – PPO | Attending: Oncology | Admitting: Oncology

## 2023-08-28 VITALS — BP 155/91 | HR 107 | Temp 98.0°F | Resp 18 | Ht 65.0 in | Wt 271.0 lb

## 2023-08-28 DIAGNOSIS — D509 Iron deficiency anemia, unspecified: Secondary | ICD-10-CM

## 2023-08-28 DIAGNOSIS — Z8546 Personal history of malignant neoplasm of prostate: Secondary | ICD-10-CM | POA: Diagnosis not present

## 2023-08-28 DIAGNOSIS — Z8042 Family history of malignant neoplasm of prostate: Secondary | ICD-10-CM | POA: Diagnosis not present

## 2023-08-28 LAB — DIRECT ANTIGLOBULIN TEST (NOT AT ARMC)
DAT, IgG: NEGATIVE
DAT, complement: NEGATIVE

## 2023-08-28 LAB — CBC (CANCER CENTER ONLY)
HCT: 35.4 % — ABNORMAL LOW (ref 39.0–52.0)
Hemoglobin: 11 g/dL — ABNORMAL LOW (ref 13.0–17.0)
MCH: 20 pg — ABNORMAL LOW (ref 26.0–34.0)
MCHC: 31.1 g/dL (ref 30.0–36.0)
MCV: 64.2 fL — ABNORMAL LOW (ref 80.0–100.0)
Platelet Count: 160 10*3/uL (ref 150–400)
RBC: 5.51 MIL/uL (ref 4.22–5.81)
RDW: 16.9 % — ABNORMAL HIGH (ref 11.5–15.5)
WBC Count: 5.2 10*3/uL (ref 4.0–10.5)
nRBC: 0 % (ref 0.0–0.2)

## 2023-08-28 LAB — FOLATE: Folate: 25 ng/mL (ref >5.9)

## 2023-08-28 LAB — LACTATE DEHYDROGENASE: LDH: 175 U/L (ref 98–192)

## 2023-08-28 LAB — VITAMIN B12: Vitamin B-12: 507 pg/mL (ref 180–914)

## 2023-08-28 NOTE — Progress Notes (Signed)
[facility]  [phone] [phone]  ID: Justin Soto OB: Mar 25, 1961  MR#: [id number]  LKG#:[id number]  Patient Care Team: Sherlyn Hay, DO as PCP - General (Family Medicine)  CHIEF COMPLAINT: Microcytic anemia.  INTERVAL HISTORY: Patient is a 62 year old male who has a reported history of thalassemia who was referred for microcytic anemia.  He currently feels well and is asymptomatic.  He has no neurologic complaints.  He does not complain of any weakness or fatigue.  He has a good appetite and denies weight loss.  He has no chest pain, shortness of breath, cough, or hemoptysis.  He denies any nausea, vomiting, constipation, or diarrhea.  He has no urinary complaints.  Patient offers no specific complaints today.  REVIEW OF SYSTEMS:   Review of Systems  Constitutional: Negative.  Negative for fever, malaise/fatigue and weight loss.  Respiratory: Negative.  Negative for cough, hemoptysis and shortness of breath.   Cardiovascular: Negative.  Negative for chest pain and leg swelling.  Gastrointestinal: Negative.  Negative for abdominal pain.  Genitourinary: Negative.  Negative for dysuria.  Musculoskeletal: Negative.  Negative for back pain.  Skin: Negative.  Negative for rash.  Neurological: Negative.  Negative for dizziness, focal weakness, weakness and headaches.  Psychiatric/Behavioral: Negative.  The patient is not nervous/anxious.     As per HPI. Otherwise, a complete review of systems is negative.  PAST MEDICAL HISTORY: Past Medical History:  Diagnosis Date   Anemia    Arthritis    Diabetes mellitus without complication (HCC)    diet controlled   Dyspnea    Enlarged prostate    hx of   Environmental and seasonal allergies    Frequency of urination    GERD (gastroesophageal reflux disease)    History of kidney stones    HOH (hard of hearing)    wears bilateral hearing aids   Hypertension    Irregular heart beat    Pt feels  this every once in a while; PCP aware but stated it was OK   Kidney stone on right side 01/15/2017   Leg pain, right    Take Lyrica and Naproxen   MRSA (methicillin resistant staph aureus) culture positive    Neck pain of over 3 months duration    Prostate cancer (HCC)    Sleep apnea    cpap-15 setting   Thalassemia    Tick fever     PAST SURGICAL HISTORY: Past Surgical History:  Procedure Laterality Date   ANTERIOR CERVICAL DECOMP/DISCECTOMY FUSION N/A 11/13/2014   Procedure: Cervical three-four, Cervical six-seven anterior cervical decompression with fusion interbody prosthesis plating and bonegraft;  Surgeon: Tressie Stalker, MD;  Location: [facility] OR;  Service: Neurosurgery;  Laterality: N/A;  Cervical three-four, Cervical six-seven anterior cervical decompression with fusion interbody prosthesis plating and bonegraft   BACK SURGERY     Lumbar X 2   CALCANEAL OSTEOTOMY Right 01/11/2017   Procedure: CALCANEAL OSTEOTOMY;  Surgeon: Deeann Saint, MD;  Location: [facility] ORS;  Service: Orthopedics;  Laterality: Right;   CIRCUMCISION N/A 12/13/2017   Procedure: PENILE BLOCK, LYSIS OF PENILE ADHESIONS;  Surgeon: Vanna Scotland, MD;  Location: [facility] ORS;  Service: Urology;  Laterality: N/A;   COLONOSCOPY     COLONOSCOPY WITH PROPOFOL N/A 09/15/2021   Procedure: COLONOSCOPY WITH PROPOFOL;  Surgeon: Toledo, Boykin Nearing, MD;  Location: [facility] ENDOSCOPY;  Service: Gastroenterology;  Laterality: N/A;  DM   CYSTOSCOPY N/A 12/13/2017   Procedure: CYSTOSCOPY;  Surgeon: Vanna Scotland, MD;  Location:  [facility] ORS;  Service: Urology;  Laterality: N/A;   DIRECT LARYNGOSCOPY N/A 11/24/2020   Procedure: MICRO DIRECT LARYNGOSCOPY W/BIOPSY;  Surgeon: Linus Salmons, MD;  Location: [facility] ORS;  Service: ENT;  Laterality: N/A;   HERNIA REPAIR Left    groin   LEG SURGERY Right    Had surgery on right calf X 7   LITHOTRIPSY     LUMBAR WOUND DEBRIDEMENT N/A 11/28/2019   Procedure: LUMBAR WOUND DEBRIDEMENT;  Surgeon: Tressie Stalker, MD;  Location: [facility] OR;  Service: Neurosurgery;  Laterality: N/A;  LUMBAR WOUND DEBRIDEMENT   QUADRICEPS TENDON REPAIR Left 06/22/2015   Procedure: REPAIR QUADRICEP TENDON;  Surgeon: Deeann Saint, MD;  Location: [facility] ORS;  Service: Orthopedics;  Laterality: Left;   QUADRICEPS TENDON REPAIR Left 08/12/2015   Procedure: REPAIR QUADRICEP TENDON;  Surgeon: Deeann Saint, MD;  Location: [facility] ORS;  Service: Orthopedics;  Laterality: Left;   ROTATOR CUFF REPAIR Left    TONSILLECTOMY     VASECTOMY     with hydrocele repaired    FAMILY HISTORY: Family History  Problem Relation Age of Onset   Prostate cancer Father    Heart disease Father    Diabetes Father    Alzheimer's disease Father    Anemia Mother    Osteoporosis Mother    Healthy Sister    Diabetes Brother    Hypertension Brother    Anemia Daughter    Diabetes Brother    Kidney disease Neg Hx    Bladder Cancer Neg Hx    Colon cancer Neg Hx     ADVANCED DIRECTIVES (Y/N):  N  HEALTH MAINTENANCE: Social History   Tobacco Use   Smoking status: Never    Passive exposure: Never   Smokeless tobacco: Never  Vaping Use   Vaping status: Never Used  Substance Use Topics   Alcohol use: No   Drug use: No     Colonoscopy:  PAP:  Bone density:  Lipid panel:  Allergies  Allergen Reactions   Methocarbamol Other (See Comments)    Numbness, and change of taste    Tizanidine Nausea Only   Vicodin [Hydrocodone-Acetaminophen] Itching    Can take with benadryl     Current Outpatient Medications  Medication Sig Dispense Refill   amLODipine-benazepril (LOTREL) 10-40 MG capsule TAKE 1 CAPSULE BY MOUTH EVERY DAY 90 capsule 1   aspirin EC 81 MG tablet Take 81 mg by mouth daily.      atorvastatin (LIPITOR) 40 MG tablet TAKE 1 TABLET BY MOUTH EVERY DAY 90 tablet 3   cetirizine (ZYRTEC) 10 MG tablet TAKE ONE TO TWO TABLETS BY MOUTH EVERY DAY AS NEEDED. (Patient taking differently: Take 10 mg by mouth daily.) 180 tablet 4    cyclobenzaprine (FLEXERIL) 10 MG tablet Take 1 tablet (10 mg total) by mouth at bedtime. 50 tablet 1   dapagliflozin propanediol (FARXIGA) 10 MG TABS tablet TAKE 1 TABLET BY MOUTH EVERY DAY BEFORE BREAKFAST 90 tablet 3   finasteride (PROSCAR) 5 MG tablet TAKE 1 TABLET (5 MG TOTAL) BY MOUTH DAILY. 90 tablet 3   fluticasone (FLONASE) 50 MCG/ACT nasal spray USE TWO SPRAYS IN EACH NOSTRIL EVERY DAY 48 mL 4   gabapentin (NEURONTIN) 300 MG capsule Take 600 mg by mouth 2 (two) times daily.     hydrochlorothiazide (HYDRODIURIL) 25 MG tablet TAKE 1 TABLET BY MOUTH EVERY DAY 90 tablet 1   metoprolol tartrate (LOPRESSOR) 25 MG tablet TAKE 1 TABLET (25 MG TOTAL) BY MOUTH DAILY. 90 tablet  3   montelukast (SINGULAIR) 10 MG tablet TAKE 1 TABLET BY MOUTH EVERY DAY IN THE MORNING 90 tablet 1   Multiple Vitamin (MULTIVITAMIN WITH MINERALS) TABS tablet Take 1 tablet by mouth daily.     NON FORMULARY Pt uses a cpap nightly     nystatin-triamcinolone ointment (MYCOLOG) Apply 1 Application topically 2 (two) times daily. 30 g 0   omeprazole (PRILOSEC) 20 MG capsule Take 20 mg by mouth daily.      Semaglutide,0.25 or 0.5MG /DOS, (OZEMPIC, 0.25 OR 0.5 MG/DOSE,) 2 MG/1.5ML SOPN Inject 0.25 mg into the skin once a week. 3 mL 1   sertraline (ZOLOFT) 50 MG tablet Take 1 tablet (50 mg total) by mouth daily. 90 tablet 3   tamsulosin (FLOMAX) 0.4 MG CAPS capsule TAKE 2 CAPSULES BY MOUTH EVERY DAY 180 capsule 3   terbinafine (LAMISIL) 250 MG tablet Take 1 tablet (250 mg total) by mouth daily. 90 tablet 0   albuterol (VENTOLIN HFA) 108 (90 Base) MCG/ACT inhaler Inhale 1-2 puffs into the lungs every 6 (six) hours as needed for shortness of breath. (Patient not taking: Reported on 08/28/2023) 1 each 0   benzonatate (TESSALON) 100 MG capsule Take 1 capsule (100 mg total) by mouth 3 (three) times daily as needed for cough. (Patient not taking: Reported on 08/28/2023) 30 capsule 0   betamethasone dipropionate 0.05 % cream APPLY TOPICALLY  TWICE A DAY 30 g 0   docusate sodium (COLACE) 100 MG capsule Take 1 capsule (100 mg total) by mouth 2 (two) times daily. (Patient not taking: Reported on 08/28/2023) 10 capsule 0   fluconazole (DIFLUCAN) 100 MG tablet Take 1 tablet (100 mg total) by mouth daily. (Patient not taking: Reported on 08/28/2023) 7 tablet 0   predniSONE (DELTASONE) 50 MG tablet Take 1 tablet (50 mg total) by mouth daily with breakfast. (Patient not taking: Reported on 08/28/2023) 5 tablet 0   No current facility-administered medications for this visit.    OBJECTIVE: Vitals:   08/28/23 1056  BP: (!) 155/91  Pulse: (!) 107  Resp: 18  Temp: 98 F (36.7 C)  SpO2: 96%     Body mass index is 45.1 kg/m.    ECOG FS:0 - Asymptomatic  General: Well-developed, well-nourished, no acute distress. Eyes: Pink conjunctiva, anicteric sclera. HEENT: Normocephalic, moist mucous membranes. Lungs: No audible wheezing or coughing. Heart: Regular rate and rhythm. Abdomen: Soft, nontender, no obvious distention. Musculoskeletal: No edema, cyanosis, or clubbing. Neuro: Alert, answering all questions appropriately. Cranial nerves grossly intact. Skin: No rashes or petechiae noted. Psych: Normal affect. Lymphatics: No cervical, calvicular, axillary or inguinal LAD.   LAB RESULTS:  Lab Results  Component Value Date   NA 138 07/25/2023   K 4.2 07/25/2023   CL 99 07/25/2023   CO2 21 07/25/2023   GLUCOSE 146 (H) 07/25/2023   BUN 18 07/25/2023   CREATININE 0.80 07/25/2023   CALCIUM 9.4 07/25/2023   PROT 7.3 07/25/2023   ALBUMIN 4.3 07/25/2023   AST 41 (H) 07/25/2023   ALT 47 (H) 07/25/2023   ALKPHOS 63 07/25/2023   BILITOT 0.6 07/25/2023   GFRNONAA >60 10/13/2022   GFRAA 92 01/20/2021    Lab Results  Component Value Date   WBC 5.9 07/25/2023   NEUTROABS 3.7 07/25/2023   HGB 10.9 (L) 07/25/2023   HCT 35.3 (L) 07/25/2023   MCV 65 (L) 07/25/2023   PLT 183 07/25/2023   Lab Results  Component Value Date   IRON 110  07/25/2023  TIBC 337 07/25/2023   IRONPCTSAT 33 07/25/2023   Lab Results  Component Value Date   FERRITIN 90 07/25/2023     STUDIES: No results found.  ASSESSMENT: Microcytic anemia.  PLAN:    Microcytic anemia: Patient noted to have decreased hemoglobin with normal iron stores on routine blood work.  He has "thalassemia" listed in his past medical history, but patient does not recall this diagnosis.  Have ordered hemoglobin fractionation cascade to confirm.  B12, folate, and hemolysis labs were drawn for completeness and are pending at time of dictation.  No intervention is needed.  If thalassemia is confirmed, no further follow-up is necessary. History of prostate cancer: Continue follow-up with urology as scheduled.  I spent a total of 45 minutes reviewing chart data, face-to-face evaluation with the patient, counseling and coordination of care as detailed above.   Patient expressed understanding and was in agreement with this plan. He also understands that He can call clinic at any time with any questions, concerns, or complaints.     Jeralyn Ruths, MD   08/28/2023 11:32 AM

## 2023-08-29 LAB — HGB FRACTIONATION CASCADE
Hgb A2: 5.2 % — ABNORMAL HIGH (ref 1.8–3.2)
Hgb A: 94.8 % — ABNORMAL LOW (ref 96.4–98.8)
Hgb F: 0 % (ref 0.0–2.0)
Hgb S: 0 %

## 2023-08-31 ENCOUNTER — Other Ambulatory Visit: Payer: Self-pay | Admitting: Podiatry

## 2023-09-23 DIAGNOSIS — G4733 Obstructive sleep apnea (adult) (pediatric): Secondary | ICD-10-CM | POA: Diagnosis not present

## 2023-09-26 DIAGNOSIS — G4733 Obstructive sleep apnea (adult) (pediatric): Secondary | ICD-10-CM | POA: Diagnosis not present

## 2023-09-28 DIAGNOSIS — Z79899 Other long term (current) drug therapy: Secondary | ICD-10-CM | POA: Diagnosis not present

## 2023-09-28 DIAGNOSIS — M0609 Rheumatoid arthritis without rheumatoid factor, multiple sites: Secondary | ICD-10-CM | POA: Diagnosis not present

## 2023-09-29 ENCOUNTER — Ambulatory Visit: Payer: BC Managed Care – PPO | Admitting: Family Medicine

## 2023-09-29 VITALS — BP 141/84 | HR 95 | Temp 98.9°F | Ht 65.0 in | Wt 260.0 lb

## 2023-09-29 DIAGNOSIS — E118 Type 2 diabetes mellitus with unspecified complications: Secondary | ICD-10-CM

## 2023-09-29 DIAGNOSIS — G4733 Obstructive sleep apnea (adult) (pediatric): Secondary | ICD-10-CM | POA: Diagnosis not present

## 2023-09-29 DIAGNOSIS — I152 Hypertension secondary to endocrine disorders: Secondary | ICD-10-CM

## 2023-09-29 DIAGNOSIS — Z7984 Long term (current) use of oral hypoglycemic drugs: Secondary | ICD-10-CM

## 2023-09-29 DIAGNOSIS — Z7985 Long-term (current) use of injectable non-insulin antidiabetic drugs: Secondary | ICD-10-CM

## 2023-09-29 DIAGNOSIS — E1159 Type 2 diabetes mellitus with other circulatory complications: Secondary | ICD-10-CM

## 2023-09-29 LAB — POCT GLYCOSYLATED HEMOGLOBIN (HGB A1C): Hemoglobin A1C: 6.9 % — AB (ref 4.0–5.6)

## 2023-09-29 MED ORDER — METOPROLOL SUCCINATE ER 50 MG PO TB24
50.0000 mg | ORAL_TABLET | Freq: Every day | ORAL | 0 refills | Status: DC
Start: 2023-09-29 — End: 2023-12-25

## 2023-09-29 MED ORDER — SEMAGLUTIDE (1 MG/DOSE) 4 MG/3ML ~~LOC~~ SOPN
1.0000 mg | PEN_INJECTOR | SUBCUTANEOUS | 0 refills | Status: DC
Start: 2023-10-20 — End: 2023-11-07

## 2023-09-29 MED ORDER — OZEMPIC (0.25 OR 0.5 MG/DOSE) 2 MG/3ML ~~LOC~~ SOPN
0.5000 mg | PEN_INJECTOR | SUBCUTANEOUS | 0 refills | Status: DC
Start: 2023-09-29 — End: 2023-11-07

## 2023-09-29 NOTE — Progress Notes (Signed)
Established patient visit   Patient: Justin Soto   DOB: [birth year]   62 y.o. Male  MRN: [id number] Visit Date: 09/29/2023  Today's healthcare provider: Sherlyn Hay, DO   Chief Complaint  Patient presents with   Diabetes    Patient has been checking his glucose and getting readings of less than 160 until he started Prednisone.  Rheumatology put him on a taper and during that time him glucose went up into the 300s.  He states they are back to normal and he is no longer taking Prednisone.    Subjective    HPI Weight: Ordered Ozempic last visit: He was able to pick it up and start.  Tolerating it well  Diabetes Mellitus Type II, Follow-up  Lab Results  Component Value Date   HGBA1C 6.9 (A) 09/29/2023   HGBA1C 7.3 (H) 06/30/2023   HGBA1C 6.7 (H) 12/27/2022   Wt Readings from Last 3 Encounters:  09/29/23 260 lb (117.9 kg)  08/28/23 271 lb (122.9 kg)  07/25/23 273 lb (123.8 kg)   Last seen for diabetes 3 months ago.  Management since then includes started Ozempic 0.25 mg weekly; has not yet increased to 0.5 mg weekly. Continued Marcelline Deist He reports excellent compliance with treatment. He is not having side effects.   Symptoms: No fatigue No foot ulcerations  No appetite changes No nausea  Yes paresthesia of the feet  - numbness in 1st R great toe since prior surgery and when wearing his boots for work. No polydipsia  No polyuria No visual disturbances   No vomiting    Saw rheumatology yesterday. Home blood sugar records: fasting range: 160s  Episodes of hypoglycemia? No    Current insulin regiment: None Most Recent Eye Exam: Had one 3 to 4 months ago  Current exercise: walking, carefully increasing Current diet habits: not asked  Pertinent Labs: Lab Results  Component Value Date   CHOL 103 06/30/2023   HDL 26 (L) 06/30/2023   LDLCALC 25 06/30/2023   TRIG 366 (H) 06/30/2023   CHOLHDL 4.0 06/30/2023   Lab Results  Component Value Date   NA 138  07/25/2023   K 4.2 07/25/2023   CREATININE 0.80 07/25/2023   EGFR 100 07/25/2023   MICRALBCREAT 25 12/27/2022     --------------------------------------------------------------------------------------------------- Hasn't been checking home blood pressures     Medications: Outpatient Medications Prior to Visit  Medication Sig   amLODipine-benazepril (LOTREL) 10-40 MG capsule TAKE 1 CAPSULE BY MOUTH EVERY DAY   aspirin EC 81 MG tablet Take 81 mg by mouth daily.    atorvastatin (LIPITOR) 40 MG tablet TAKE 1 TABLET BY MOUTH EVERY DAY   cetirizine (ZYRTEC) 10 MG tablet TAKE ONE TO TWO TABLETS BY MOUTH EVERY DAY AS NEEDED. (Patient taking differently: Take 10 mg by mouth daily.)   cyclobenzaprine (FLEXERIL) 10 MG tablet Take 1 tablet (10 mg total) by mouth at bedtime.   dapagliflozin propanediol (FARXIGA) 10 MG TABS tablet TAKE 1 TABLET BY MOUTH EVERY DAY BEFORE BREAKFAST   finasteride (PROSCAR) 5 MG tablet TAKE 1 TABLET (5 MG TOTAL) BY MOUTH DAILY.   fluticasone (FLONASE) 50 MCG/ACT nasal spray USE TWO SPRAYS IN EACH NOSTRIL EVERY DAY   gabapentin (NEURONTIN) 300 MG capsule Take 600 mg by mouth 2 (two) times daily.   hydrochlorothiazide (HYDRODIURIL) 25 MG tablet TAKE 1 TABLET BY MOUTH EVERY DAY   montelukast (SINGULAIR) 10 MG tablet TAKE 1 TABLET BY MOUTH EVERY DAY IN THE MORNING  Multiple Vitamin (MULTIVITAMIN WITH MINERALS) TABS tablet Take 1 tablet by mouth daily.   NON FORMULARY Pt uses a cpap nightly   omeprazole (PRILOSEC) 20 MG capsule Take 20 mg by mouth daily.    Semaglutide,0.25 or 0.5MG /DOS, (OZEMPIC, 0.25 OR 0.5 MG/DOSE,) 2 MG/1.5ML SOPN Inject 0.25 mg into the skin once a week.   sertraline (ZOLOFT) 50 MG tablet Take 1 tablet (50 mg total) by mouth daily.   tamsulosin (FLOMAX) 0.4 MG CAPS capsule TAKE 2 CAPSULES BY MOUTH EVERY DAY   [DISCONTINUED] metoprolol tartrate (LOPRESSOR) 25 MG tablet TAKE 1 TABLET (25 MG TOTAL) BY MOUTH DAILY.   [DISCONTINUED] albuterol (VENTOLIN  HFA) 108 (90 Base) MCG/ACT inhaler Inhale 1-2 puffs into the lungs every 6 (six) hours as needed for shortness of breath. (Patient not taking: Reported on 08/28/2023)   [DISCONTINUED] benzonatate (TESSALON) 100 MG capsule Take 1 capsule (100 mg total) by mouth 3 (three) times daily as needed for cough. (Patient not taking: Reported on 08/28/2023)   [DISCONTINUED] betamethasone dipropionate 0.05 % cream APPLY TOPICALLY TWICE A DAY   [DISCONTINUED] docusate sodium (COLACE) 100 MG capsule Take 1 capsule (100 mg total) by mouth 2 (two) times daily. (Patient not taking: Reported on 08/28/2023)   [DISCONTINUED] fluconazole (DIFLUCAN) 100 MG tablet Take 1 tablet (100 mg total) by mouth daily. (Patient not taking: Reported on 08/28/2023)   [DISCONTINUED] nystatin-triamcinolone ointment (MYCOLOG) Apply 1 Application topically 2 (two) times daily.   [DISCONTINUED] predniSONE (DELTASONE) 50 MG tablet Take 1 tablet (50 mg total) by mouth daily with breakfast. (Patient not taking: Reported on 08/28/2023)   [DISCONTINUED] terbinafine (LAMISIL) 250 MG tablet Take 1 tablet (250 mg total) by mouth daily.   No facility-administered medications prior to visit.    Review of Systems  Eyes:  Negative for visual disturbance.  Respiratory: Negative.  Negative for cough, shortness of breath and wheezing.   Cardiovascular:  Negative for chest pain, palpitations and leg swelling.  Gastrointestinal:  Negative for abdominal pain, constipation, diarrhea, nausea and vomiting.  Endocrine: Negative for polyuria.  Neurological:  Positive for numbness. Negative for weakness and headaches.        Objective    BP (!) 141/84 (BP Location: Left Arm, Patient Position: Sitting, Cuff Size: Normal)   Pulse 95   Temp 98.9 F (37.2 C) (Oral)   Ht 5\' 5"  (1.651 m)   Wt 260 lb (117.9 kg)   SpO2 97%   BMI 43.27 kg/m     Physical Exam Vitals and nursing note reviewed.  Constitutional:      General: He is not in acute distress.     Appearance: Normal appearance.  HENT:     Head: Normocephalic and atraumatic.  Eyes:     General: No scleral icterus.    Conjunctiva/sclera: Conjunctivae normal.  Cardiovascular:     Rate and Rhythm: Normal rate.  Pulmonary:     Effort: Pulmonary effort is normal.  Neurological:     Mental Status: He is alert and oriented to person, place, and time. Mental status is at baseline.  Psychiatric:        Mood and Affect: Mood normal.        Behavior: Behavior normal.       Results for orders placed or performed in visit on 09/29/23  POCT glycosylated hemoglobin (Hb A1C)  Result Value Ref Range   Hemoglobin A1C 6.9 (A) 4.0 - 5.6 %   HbA1c POC (<> result, manual entry)     HbA1c, POC (  prediabetic range)     HbA1c, POC (controlled diabetic range)      Assessment & Plan    Hypertension associated with diabetes [facility]) Assessment & Plan: Blood pressure mildly elevated but stable from previous cardiology visit Per cardiology note, his blood pressure is within target goal at the current level.  Will defer to their management. Additionally, I do expect his blood pressure to improve as he will likely lose weight with the Ozempic. Refilled patient's metoprolol XL 50 mg daily. Continue amlodipine-benazepril 10-40 mg daily   Orders: -     Metoprolol Succinate ER; Take 1 tablet (50 mg total) by mouth daily. Take with or immediately following a meal.  Dispense: 90 tablet; Refill: 0  Type 2 diabetes mellitus with complication (HCC) Assessment & Plan: POC A1c 6.9 today Increasing Ozempic dosing. Continue Farxiga for time-being; will stop if BG becomes low or when at a higher dose of Ozempic.  Orders: -     POCT glycosylated hemoglobin (Hb A1C) -     Ozempic (0.25 or 0.5 MG/DOSE); Inject 0.5 mg into the skin once a week.  Dispense: 3 mL; Refill: 0 -     Semaglutide (1 MG/DOSE); Inject 1 mg as directed once a week.  Dispense: 3 mL; Refill: 0  OSA (obstructive sleep apnea) Assessment &  Plan: Uses CPAP with built-in humidifier regularly No acute concerns.     Return in about 4 weeks (around 10/27/2023).      I discussed the assessment and treatment plan with the patient  The patient was provided an opportunity to ask questions and all were answered. The patient agreed with the plan and demonstrated an understanding of the instructions.   The patient was advised to call back or seek an in-person evaluation if the symptoms worsen or if the condition fails to improve as anticipated.    Sherlyn Hay, DO  [facility] [phone] (phone) [phone] (fax)  [facility]

## 2023-09-29 NOTE — Assessment & Plan Note (Signed)
POC A1c 6.9 today Increasing Ozempic dosing. Continue Farxiga for time-being; will stop if BG becomes low or when at a higher dose of Ozempic.

## 2023-09-29 NOTE — Patient Instructions (Signed)
If you have 1 or 2 doses left on your current pen, go ahead and take 0.5 mg for your next dose.  After that, go ahead and start 0.5 mg on your next pen for every dose for 4 weeks.  Four weeks from now go ahead and fill your 1 mg pen and start taking 1 mg/week, as long as you are tolerating the 0.5 mg dose well.  If you are not, please let me know prior to needing the refill

## 2023-09-29 NOTE — Assessment & Plan Note (Addendum)
Uses CPAP with built-in humidifier regularly No acute concerns.

## 2023-10-09 ENCOUNTER — Encounter: Payer: Self-pay | Admitting: Family Medicine

## 2023-10-09 NOTE — Assessment & Plan Note (Addendum)
Blood pressure mildly elevated but stable from previous cardiology visit Per cardiology note, his blood pressure is within target goal at the current level.  Will defer to their management. Additionally, I do expect his blood pressure to improve as he will likely lose weight with the Ozempic. Refilled patient's metoprolol XL 50 mg daily. Continue amlodipine-benazepril 10-40 mg daily

## 2023-10-24 DIAGNOSIS — G4733 Obstructive sleep apnea (adult) (pediatric): Secondary | ICD-10-CM | POA: Diagnosis not present

## 2023-10-28 ENCOUNTER — Other Ambulatory Visit: Payer: Self-pay | Admitting: Family Medicine

## 2023-10-28 ENCOUNTER — Encounter: Payer: Self-pay | Admitting: Family Medicine

## 2023-10-28 DIAGNOSIS — E118 Type 2 diabetes mellitus with unspecified complications: Secondary | ICD-10-CM

## 2023-10-30 NOTE — Telephone Encounter (Signed)
Requested medications are due for refill today.  unsure  Requested medications are on the active medications list.  yes  Last refill. 10/20/2023 3 mL 0 rf  Future visit scheduled.   yes  Notes to clinic.  Please review for refill.    Requested Prescriptions  Pending Prescriptions Disp Refills   OZEMPIC, 1 MG/DOSE, 4 MG/3ML SOPN [Pharmacy Med Name: OZEMPIC 4 MG/3 ML (1 MG/DOSE)]      Sig: INJECT 1 MG ONCE A WEEK AS DIRECTED     Endocrinology:  Diabetes - GLP-1 Receptor Agonists - semaglutide Failed - 10/28/2023  5:02 PM      Failed - HBA1C in normal range and within 180 days    Hemoglobin A1C  Date Value Ref Range Status  09/29/2023 6.9 (A) 4.0 - 5.6 % Final   Hgb A1c MFr Bld  Date Value Ref Range Status  06/30/2023 7.3 (H) 4.8 - 5.6 % Final    Comment:             Prediabetes: 5.7 - 6.4          Diabetes: >6.4          Glycemic control for adults with diabetes: <7.0          Passed - Cr in normal range and within 360 days    Creatinine  Date Value Ref Range Status  08/11/2014 1.01 0.60 - 1.30 mg/dL Final   Creatinine, Ser  Date Value Ref Range Status  07/25/2023 0.80 0.76 - 1.27 mg/dL Final         Passed - Valid encounter within last 6 months    Recent Outpatient Visits           1 month ago Type 2 diabetes mellitus with complication [facility])   [place] [facility] Pardue, Monico Blitz, DO   2 months ago Positive self-administered antigen test for COVID-19   [facility] Pardue, Sarah N, DO   3 months ago Acute gastritis without hemorrhage, unspecified gastritis type   [facility] Merita Norton T, FNP   4 months ago Hypertension associated with diabetes [facility])   [place] [facility] Pardue, Monico Blitz, DO   10 months ago Type 2 diabetes mellitus without complication, without long-term current use of insulin [facility])   [facility] Alfredia Ferguson, PA-C        Future Appointments             In 1 week Pardue, Monico Blitz, DO [facility], [facility]

## 2023-11-07 ENCOUNTER — Ambulatory Visit: Payer: BC Managed Care – PPO | Admitting: Family Medicine

## 2023-11-07 ENCOUNTER — Encounter: Payer: Self-pay | Admitting: Family Medicine

## 2023-11-07 VITALS — BP 149/79 | HR 97 | Ht 66.0 in | Wt 254.0 lb

## 2023-11-07 DIAGNOSIS — H60542 Acute eczematoid otitis externa, left ear: Secondary | ICD-10-CM | POA: Diagnosis not present

## 2023-11-07 DIAGNOSIS — H6692 Otitis media, unspecified, left ear: Secondary | ICD-10-CM | POA: Diagnosis not present

## 2023-11-07 DIAGNOSIS — E118 Type 2 diabetes mellitus with unspecified complications: Secondary | ICD-10-CM | POA: Diagnosis not present

## 2023-11-07 DIAGNOSIS — I152 Hypertension secondary to endocrine disorders: Secondary | ICD-10-CM

## 2023-11-07 DIAGNOSIS — E1159 Type 2 diabetes mellitus with other circulatory complications: Secondary | ICD-10-CM

## 2023-11-07 MED ORDER — HYDROCORTISONE-ACETIC ACID 1-2 % OT SOLN: 4.0000 [drp] | Freq: Two times a day (BID) | OTIC | 0 refills | Status: AC

## 2023-11-07 MED ORDER — SEMAGLUTIDE (1 MG/DOSE) 4 MG/3ML ~~LOC~~ SOPN: 1.0000 mg | PEN_INJECTOR | SUBCUTANEOUS | 1 refills | Status: DC

## 2023-11-07 MED ORDER — AMOXICILLIN-POT CLAVULANATE 875-125 MG PO TABS
1.0000 | ORAL_TABLET | Freq: Two times a day (BID) | ORAL | 0 refills | Status: AC
Start: 2023-11-07 — End: 2023-11-12

## 2023-11-07 NOTE — Assessment & Plan Note (Addendum)
Hypertension: Mildly elevated blood pressure today; however, given reported well controlled blood pressures until the past few days, will not adjust blood pressure medication today as it is likely secondary to his recent poor sleep rather than poor control by his medication. Continue amlodipine--benazepril 10-40 mg daily Continue hydrochlorothiazide 25 mg daily Continue metoprolol succinate 50 mg daily  Diabetes: Well-controlled. Continue semaglutide at 1 mg [place] every week. Plan to recheck A1c at next visit (12/30/2023 or later)

## 2023-11-07 NOTE — Progress Notes (Signed)
Established patient visit   Patient: Justin Soto   DOB: August 12, 1961   62 y.o. Male  MRN: [id number] Visit Date: 11/07/2023  Today's healthcare provider: Sherlyn Hay, DO   Chief Complaint  Patient presents with   Medical Management of Chronic Issues    5 week follow-up   Hypertension    173/99 this morning. Reports he didn't sleep good last night   Immunizations    Patient declined shingles and tetanus vaccine   Diabetes    Eye exam first of 2024   Subjective    HPI Patient reports doing well on Ozempic However blood sugars been? Ready to increase to 2 mg or would like to stay with 1 mg question 149/79  The patient, with a history of sleep apnea, diabetes, and hypertension, presents with complaints of poor sleep quality due to issues with his CPAP machine. He reports that the pressure from the machine is waking him up frequently, leading to disrupted sleep. The patient notes that the issue started after changing from an older model of the machine (model 10) to a newer one (model 11), and changing the mask style. He has contacted the machine provider for assistance and is currently awaiting a response.  The patient also reports a recent increase in blood pressure readings at home due to his recent poor sleep. However, he notes that his blood pressure has generally been well-controlled since his last visit and the initiation of new medication.  The patient has been experiencing some constipation, which he attributes to the new diabetes medication. However, he reports that it is not severe and does not disrupt his daily activities. His blood sugars have been ranging from 110 to 146 since starting the medication.  The patient has been making lifestyle changes, including regular walking and dietary modifications, which have resulted in a weight loss of nearly 20 pounds since his last visit. He reports feeling more energetic and overall better since the weight loss.  Lastly,  the patient reports some discomfort and itching in his left ear, which has been ongoing for a couple of weeks. He has a history of using hearing aids, but has had issues with skin reactions and device malfunctions. He has been managing his symptoms with cetirizine and fluticasone nasal spray.     Medications: Outpatient Medications Prior to Visit  Medication Sig   amLODipine-benazepril (LOTREL) 10-40 MG capsule TAKE 1 CAPSULE BY MOUTH EVERY DAY   aspirin EC 81 MG tablet Take 81 mg by mouth daily.    atorvastatin (LIPITOR) 40 MG tablet TAKE 1 TABLET BY MOUTH EVERY DAY   cetirizine (ZYRTEC) 10 MG tablet TAKE ONE TO TWO TABLETS BY MOUTH EVERY DAY AS NEEDED. (Patient taking differently: Take 10 mg by mouth daily. Take one tablet daily)   cyclobenzaprine (FLEXERIL) 10 MG tablet Take 1 tablet (10 mg total) by mouth at bedtime.   dapagliflozin propanediol (FARXIGA) 10 MG TABS tablet TAKE 1 TABLET BY MOUTH EVERY DAY BEFORE BREAKFAST   finasteride (PROSCAR) 5 MG tablet TAKE 1 TABLET (5 MG TOTAL) BY MOUTH DAILY.   fluticasone (FLONASE) 50 MCG/ACT nasal spray USE TWO SPRAYS IN EACH NOSTRIL EVERY DAY   gabapentin (NEURONTIN) 300 MG capsule Take 600 mg by mouth 2 (two) times daily.   hydrochlorothiazide (HYDRODIURIL) 25 MG tablet TAKE 1 TABLET BY MOUTH EVERY DAY   metoprolol succinate (TOPROL-XL) 50 MG 24 hr tablet Take 1 tablet (50 mg total) by mouth daily. Take with or  immediately following a meal.   montelukast (SINGULAIR) 10 MG tablet TAKE 1 TABLET BY MOUTH EVERY DAY IN THE MORNING   Multiple Vitamin (MULTIVITAMIN WITH MINERALS) TABS tablet Take 1 tablet by mouth daily.   NON FORMULARY Pt uses a cpap nightly   omeprazole (PRILOSEC) 20 MG capsule Take 20 mg by mouth daily.    sertraline (ZOLOFT) 50 MG tablet Take 1 tablet (50 mg total) by mouth daily.   tamsulosin (FLOMAX) 0.4 MG CAPS capsule TAKE 2 CAPSULES BY MOUTH EVERY DAY   [DISCONTINUED] Semaglutide, 1 MG/DOSE, 4 MG/3ML SOPN Inject 1 mg as  directed once a week.   [DISCONTINUED] Semaglutide,0.25 or 0.5MG /DOS, (OZEMPIC, 0.25 OR 0.5 MG/DOSE,) 2 MG/1.5ML SOPN Inject 0.25 mg into the skin once a week.   [DISCONTINUED] Semaglutide,0.25 or 0.5MG /DOS, (OZEMPIC, 0.25 OR 0.5 MG/DOSE,) 2 MG/3ML SOPN Inject 0.5 mg into the skin once a week.   No facility-administered medications prior to visit.    Review of Systems  HENT:  Positive for ear pain (left ear only).        +left ear itching  Respiratory: Negative.  Negative for cough, shortness of breath and wheezing.   Cardiovascular:  Negative for chest pain, palpitations and leg swelling.  Neurological:  Negative for weakness and headaches.        Objective    BP (!) 149/79 (BP Location: Left Arm, Patient Position: Sitting, Cuff Size: Large) Comment: home reading this am  Pulse 97   Ht 5\' 6"  (1.676 m)   Wt 254 lb (115.2 kg)   SpO2 98%   BMI 41.00 kg/m     Physical Exam Vitals and nursing note reviewed.  Constitutional:      General: He is not in acute distress.    Appearance: Normal appearance.  HENT:     Head: Normocephalic and atraumatic.     Right Ear: Ear canal and external ear normal. A middle ear effusion is present. There is no impacted cerumen.     Left Ear: External ear normal. A middle ear effusion is present. There is no impacted cerumen.  Eyes:     General: No scleral icterus.    Conjunctiva/sclera: Conjunctivae normal.  Cardiovascular:     Rate and Rhythm: Normal rate.  Pulmonary:     Effort: Pulmonary effort is normal.  Neurological:     Mental Status: He is alert and oriented to person, place, and time. Mental status is at baseline.  Psychiatric:        Mood and Affect: Mood normal.        Behavior: Behavior normal.      Results for orders placed or performed in visit on 11/07/23  HM DIABETES EYE EXAM  Result Value Ref Range   HM Diabetic Eye Exam No Retinopathy No Retinopathy    Assessment & Plan    Hypertension associated with diabetes  (HCC) Assessment & Plan: Hypertension: Mildly elevated blood pressure today; however, given reported well controlled blood pressures until the past few days, will not adjust blood pressure medication today as it is likely secondary to his recent poor sleep rather than poor control by his medication. Continue amlodipine--benazepril 10-40 mg daily Continue hydrochlorothiazide 25 mg daily Continue metoprolol succinate 50 mg daily  Diabetes: Well-controlled. Continue semaglutide at 1 mg [place] every week. Plan to recheck A1c at next visit (12/30/2023 or later)   Acute left otitis media -     Amoxicillin-Pot Clavulanate; Take 1 tablet by mouth 2 (two) times daily for 5  days.  Dispense: 10 tablet; Refill: 0  Type 2 diabetes mellitus with complication (HCC) -     Semaglutide (1 MG/DOSE); Inject 1 mg as directed once a week.  Dispense: 3 mL; Refill: 1  Acute eczematoid otitis externa of left ear -     Hydrocortisone-Acetic Acid; Place 4 drops into the left ear 2 (two) times daily for 7 days.  Dispense: 10 mL; Refill: 0    Return in about 8 weeks (around 01/01/2024) for DM, Weight.      I discussed the assessment and treatment plan with the patient  The patient was provided an opportunity to ask questions and all were answered. The patient agreed with the plan and demonstrated an understanding of the instructions.   The patient was advised to call back or seek an in-person evaluation if the symptoms worsen or if the condition fails to improve as anticipated.    Sherlyn Hay, DO  [facility] [phone] (phone) [phone] (fax)  [facility]

## 2023-11-23 DIAGNOSIS — G4733 Obstructive sleep apnea (adult) (pediatric): Secondary | ICD-10-CM | POA: Diagnosis not present

## 2023-11-28 ENCOUNTER — Other Ambulatory Visit: Payer: Self-pay | Admitting: Physician Assistant

## 2023-11-28 DIAGNOSIS — I10 Essential (primary) hypertension: Secondary | ICD-10-CM

## 2023-12-01 NOTE — Telephone Encounter (Signed)
 Results abstracted and Care Team is up to date.

## 2023-12-22 DIAGNOSIS — G4733 Obstructive sleep apnea (adult) (pediatric): Secondary | ICD-10-CM | POA: Diagnosis not present

## 2023-12-23 ENCOUNTER — Other Ambulatory Visit: Payer: Self-pay | Admitting: Family Medicine

## 2023-12-23 DIAGNOSIS — F411 Generalized anxiety disorder: Secondary | ICD-10-CM

## 2023-12-24 ENCOUNTER — Other Ambulatory Visit: Payer: Self-pay | Admitting: Family Medicine

## 2023-12-24 DIAGNOSIS — E1159 Type 2 diabetes mellitus with other circulatory complications: Secondary | ICD-10-CM

## 2023-12-27 DIAGNOSIS — G4733 Obstructive sleep apnea (adult) (pediatric): Secondary | ICD-10-CM | POA: Diagnosis not present

## 2023-12-28 ENCOUNTER — Other Ambulatory Visit: Payer: Self-pay | Admitting: Family Medicine

## 2023-12-28 DIAGNOSIS — E118 Type 2 diabetes mellitus with unspecified complications: Secondary | ICD-10-CM

## 2024-01-02 ENCOUNTER — Ambulatory Visit: Payer: BC Managed Care – PPO | Admitting: Family Medicine

## 2024-01-02 DIAGNOSIS — M47812 Spondylosis without myelopathy or radiculopathy, cervical region: Secondary | ICD-10-CM | POA: Diagnosis not present

## 2024-01-02 DIAGNOSIS — M4317 Spondylolisthesis, lumbosacral region: Secondary | ICD-10-CM | POA: Diagnosis not present

## 2024-01-04 ENCOUNTER — Encounter: Payer: Self-pay | Admitting: Family Medicine

## 2024-01-04 ENCOUNTER — Ambulatory Visit (INDEPENDENT_AMBULATORY_CARE_PROVIDER_SITE_OTHER): Payer: BC Managed Care – PPO | Admitting: Family Medicine

## 2024-01-04 VITALS — BP 138/77 | HR 94 | Resp 18 | Ht 66.0 in | Wt 247.0 lb

## 2024-01-04 DIAGNOSIS — E1169 Type 2 diabetes mellitus with other specified complication: Secondary | ICD-10-CM

## 2024-01-04 DIAGNOSIS — E118 Type 2 diabetes mellitus with unspecified complications: Secondary | ICD-10-CM

## 2024-01-04 DIAGNOSIS — J301 Allergic rhinitis due to pollen: Secondary | ICD-10-CM

## 2024-01-04 DIAGNOSIS — E785 Hyperlipidemia, unspecified: Secondary | ICD-10-CM

## 2024-01-04 DIAGNOSIS — Z7985 Long-term (current) use of injectable non-insulin antidiabetic drugs: Secondary | ICD-10-CM | POA: Diagnosis not present

## 2024-01-04 DIAGNOSIS — E1159 Type 2 diabetes mellitus with other circulatory complications: Secondary | ICD-10-CM

## 2024-01-04 DIAGNOSIS — I152 Hypertension secondary to endocrine disorders: Secondary | ICD-10-CM

## 2024-01-04 MED ORDER — DAPAGLIFLOZIN PROPANEDIOL 10 MG PO TABS: ORAL_TABLET | ORAL | 3 refills | Status: DC

## 2024-01-04 MED ORDER — HYDROCHLOROTHIAZIDE 25 MG PO TABS: 25.0000 mg | ORAL_TABLET | Freq: Every day | ORAL | 1 refills | Status: DC

## 2024-01-04 MED ORDER — SEMAGLUTIDE (2 MG/DOSE) 8 MG/3ML ~~LOC~~ SOPN: 2.0000 mg | PEN_INJECTOR | SUBCUTANEOUS | 1 refills | Status: DC

## 2024-01-04 MED ORDER — AMLODIPINE BESY-BENAZEPRIL HCL 10-40 MG PO CAPS: 1.0000 | ORAL_CAPSULE | Freq: Every day | ORAL | 3 refills | Status: AC

## 2024-01-04 MED ORDER — METOPROLOL SUCCINATE ER 50 MG PO TB24: 50.0000 mg | ORAL_TABLET | Freq: Every day | ORAL | 1 refills | Status: DC

## 2024-01-04 MED ORDER — MONTELUKAST SODIUM 10 MG PO TABS: 10.0000 mg | ORAL_TABLET | Freq: Every day | ORAL | 3 refills | Status: AC

## 2024-01-04 MED ORDER — ATORVASTATIN CALCIUM 40 MG PO TABS: 40.0000 mg | ORAL_TABLET | Freq: Every day | ORAL | 3 refills | Status: AC

## 2024-01-04 MED ORDER — CETIRIZINE HCL 10 MG PO TABS: 10.0000 mg | ORAL_TABLET | Freq: Every day | ORAL | 3 refills | Status: DC

## 2024-01-04 NOTE — Progress Notes (Signed)
Established patient visit   Patient: Justin Soto   DOB: [birth year]   64 y.o. Male  MRN: [id number] Visit Date: 01/04/2024  Today's healthcare provider: Sherlyn Hay, DO   Chief Complaint  Patient presents with   Diabetes   Subjective    HPI The patient, with a history of diabetes, hypertension, and back surgery, reports no new health issues since the last visit. He has been managing his diabetes with Ozempic, which was recently increased in dosage. The patient reports feeling better overall, with blood sugars consistently around 128-130.   However, he has plateaued in his weight loss, currently at 247 lbs, despite regular walking. The patient also reports a recent fall in a parking lot due to a foot drop, resulting in a hand and knee injury, but no residual issues. He has a history of numbness in the tip of his toe and back pain radiating down the leg, which improves with movement.   He is also on sertraline, metoprolol, Farxiga, atorvastatin, gabapentin, and Zyrtec, and uses Flonase year-round, purchased over-the-counter. He has not had an eye exam since the last visit and is due for several vaccines, including tetanus and pneumonia, but declined the COVID booster and shingles vaccine.     Medications: Outpatient Medications Prior to Visit  Medication Sig   aspirin EC 81 MG tablet Take 81 mg by mouth daily.    cyclobenzaprine (FLEXERIL) 10 MG tablet Take 1 tablet (10 mg total) by mouth at bedtime.   finasteride (PROSCAR) 5 MG tablet TAKE 1 TABLET (5 MG TOTAL) BY MOUTH DAILY.   fluticasone (FLONASE) 50 MCG/ACT nasal spray USE TWO SPRAYS IN EACH NOSTRIL EVERY DAY   gabapentin (NEURONTIN) 300 MG capsule Take 600 mg by mouth 2 (two) times daily.   Multiple Vitamin (MULTIVITAMIN WITH MINERALS) TABS tablet Take 1 tablet by mouth daily.   NON FORMULARY Pt uses a cpap nightly   omeprazole (PRILOSEC) 20 MG capsule Take 20 mg by mouth daily.    sertraline (ZOLOFT) 50 MG tablet  TAKE 1 TABLET BY MOUTH EVERY DAY   tamsulosin (FLOMAX) 0.4 MG CAPS capsule TAKE 2 CAPSULES BY MOUTH EVERY DAY   [DISCONTINUED] amLODipine-benazepril (LOTREL) 10-40 MG capsule TAKE 1 CAPSULE BY MOUTH EVERY DAY   [DISCONTINUED] atorvastatin (LIPITOR) 40 MG tablet TAKE 1 TABLET BY MOUTH EVERY DAY   [DISCONTINUED] cetirizine (ZYRTEC) 10 MG tablet TAKE ONE TO TWO TABLETS BY MOUTH EVERY DAY AS NEEDED. (Patient taking differently: Take 10 mg by mouth daily. Take one tablet daily)   [DISCONTINUED] dapagliflozin propanediol (FARXIGA) 10 MG TABS tablet TAKE 1 TABLET BY MOUTH EVERY DAY BEFORE BREAKFAST   [DISCONTINUED] hydrochlorothiazide (HYDRODIURIL) 25 MG tablet TAKE 1 TABLET BY MOUTH EVERY DAY   [DISCONTINUED] metoprolol succinate (TOPROL-XL) 50 MG 24 hr tablet TAKE 1 TABLET BY MOUTH DAILY. TAKE WITH OR IMMEDIATELY FOLLOWING A MEAL.   [DISCONTINUED] montelukast (SINGULAIR) 10 MG tablet TAKE 1 TABLET BY MOUTH EVERY DAY IN THE MORNING   [DISCONTINUED] Semaglutide, 1 MG/DOSE, (OZEMPIC, 1 MG/DOSE,) 4 MG/3ML SOPN INJECT 1 MG ONCE A WEEK AS DIRECTED   No facility-administered medications prior to visit.        Objective    BP 138/77 (BP Location: Left Arm, Patient Position: Sitting, Cuff Size: Large)   Pulse 94   Resp 18   Ht 5\' 6"  (1.676 m)   Wt 247 lb (112 kg)   SpO2 96%   BMI 39.87 kg/m     Physical  Exam Constitutional:      Appearance: Normal appearance.  HENT:     Head: Normocephalic and atraumatic.  Eyes:     General: No scleral icterus.    Conjunctiva/sclera: Conjunctivae normal.  Cardiovascular:     Pulses:          Dorsalis pedis pulses are 2+ on the right side and 2+ on the left side.       Posterior tibial pulses are 2+ on the right side and 2+ on the left side.  Musculoskeletal:     Right foot: Normal range of motion. No deformity, bunion, Charcot foot, foot drop or prominent metatarsal heads.     Left foot: Normal range of motion. No deformity, bunion, Charcot foot, foot  drop or prominent metatarsal heads.  Feet:     Right foot:     Protective Sensation: 10 sites tested.  10 sites sensed.     Skin integrity: No ulcer, blister, skin breakdown, erythema, warmth, callus, dry skin or fissure.     Toenail Condition: Right toenails are normal.     Left foot:     Protective Sensation: 10 sites tested.  10 sites sensed.     Skin integrity: No ulcer, blister, skin breakdown, erythema, warmth, callus, dry skin or fissure.     Toenail Condition: Left toenails are normal.  Neurological:     Mental Status: He is alert and oriented to person, place, and time. Mental status is at baseline.  Psychiatric:        Mood and Affect: Mood normal.        Behavior: Behavior normal.      Results for orders placed or performed in visit on 01/04/24  Microalbumin / creatinine urine ratio  Result Value Ref Range   Creatinine, Urine 151.9 Not Estab. mg/dL   Microalbumin, Urine 60.4 Not Estab. ug/mL   Microalb/Creat Ratio 15 0 - 29 mg/g creat  Comprehensive metabolic panel  Result Value Ref Range   Glucose 104 (H) 70 - 99 mg/dL   BUN 15 8 - 27 mg/dL   Creatinine, Ser 5.40 0.76 - 1.27 mg/dL   eGFR 98 >98 JX/BJY/7.82   BUN/Creatinine Ratio 18 10 - 24   Sodium 139 134 - 144 mmol/L   Potassium 4.2 3.5 - 5.2 mmol/L   Chloride 98 96 - 106 mmol/L   CO2 28 20 - 29 mmol/L   Calcium 9.8 8.6 - 10.2 mg/dL   Total Protein 7.8 6.0 - 8.5 g/dL   Albumin 4.4 3.9 - 4.9 g/dL   Globulin, Total 3.4 1.5 - 4.5 g/dL   Bilirubin Total 0.8 0.0 - 1.2 mg/dL   Alkaline Phosphatase 71 44 - 121 IU/L   AST 34 0 - 40 IU/L   ALT 44 0 - 44 IU/L  Hemoglobin A1c  Result Value Ref Range   Hgb A1c MFr Bld 6.0 (H) 4.8 - 5.6 %   Est. average glucose Bld gHb Est-mCnc 126 mg/dL  Lipid panel  Result Value Ref Range   Cholesterol, Total 101 100 - 199 mg/dL   Triglycerides 956 (H) 0 - 149 mg/dL   HDL 27 (L) >21 mg/dL   VLDL Cholesterol Cal 28 5 - 40 mg/dL   LDL Chol Calc (NIH) 46 0 - 99 mg/dL   Chol/HDL  Ratio 3.7 0.0 - 5.0 ratio    Assessment & Plan    Hypertension associated with diabetes (HCC) Assessment & Plan: Fairly stable. On metoprolol. Medication refill requested. - Refill metoprolol prescription  Orders: -     Comprehensive metabolic panel -     Lipid panel -     amLODIPine Besy-Benazepril HCl; Take 1 capsule by mouth daily.  Dispense: 90 capsule; Refill: 3 -     hydroCHLOROthiazide; Take 1 tablet (25 mg total) by mouth daily.  Dispense: 90 tablet; Refill: 1 -     Metoprolol Succinate ER; Take 1 tablet (50 mg total) by mouth daily. TAKE WITH OR IMMEDIATELY FOLLOWING A MEAL.  Dispense: 90 tablet; Refill: 1 -     Dapagliflozin Propanediol; TAKE 1 TABLET BY MOUTH EVERY DAY BEFORE BREAKFAST  Dispense: 90 tablet; Refill: 3  Type 2 diabetes mellitus with complication (HCC) Assessment & Plan: Blood sugars around 128-130 mg/dL. Current A1c is 6.9%. On Ozempic 1 mg with good tolerance, no significant side effects. Discussed increasing to 2 mg for further weight loss and glycemic control. Alternative of switching to [facility] discussed but insurance approval may be an issue. Also on Farxiga. - Increase Ozempic to 2 mg once current supply is confirmed - Order A1c, metabolic panel, and urine test for protein to monitor kidney function  Orders: -     Microalbumin / creatinine urine ratio -     Comprehensive metabolic panel -     Hemoglobin A1c -     Lipid panel -     Semaglutide (2 MG/DOSE); Inject 2 mg as directed once a week.  Dispense: 9 mL; Refill: 1  Hyperlipidemia associated with type 2 diabetes mellitus (HCC) Assessment & Plan: On atorvastatin. Medication refill requested. - Refill atorvastatin prescription   Orders: -     Atorvastatin Calcium; Take 1 tablet (40 mg total) by mouth daily.  Dispense: 90 tablet; Refill: 3  Morbid obesity (HCC) Assessment & Plan: Weight is 247 lbs, down from 254 lbs. Reports difficulty with weight loss due to decreased physical activity related  to weather conditions. Continues to walk when weather permits. - Encourage continued physical activity as weather permits - Monitor weight and adjust treatment as needed   Non-seasonal allergic rhinitis due to pollen Assessment & Plan: Refill zyrtec and singulair.  Orders: -     Cetirizine HCl; Take 1 tablet (10 mg total) by mouth daily. Take one tablet daily  Dispense: 90 tablet; Refill: 3 -     Montelukast Sodium; Take 1 tablet (10 mg total) by mouth at bedtime.  Dispense: 90 tablet; Refill: 3  Chronic Back Pain On gabapentin for back pain related to previous back surgery. Reports numbness and tingling in the left foot, which improves with movement. No new symptoms reported. - Refill gabapentin prescription  General Health Maintenance Due for tetanus, pneumonia, and shingles vaccinations. Declined COVID booster and shingles vaccine due to upcoming travel. Discussed shingles vaccine effectiveness (97%) and common side effects (pain at injection site, flu-like symptoms). - Patient declined tetanus vaccine, pneumonia vaccine (Prevnar 20 or 21), and shingles vaccine.   Return in about 3 months (around 04/03/2024) for DM, HTN, Weight.      I discussed the assessment and treatment plan with the patient  The patient was provided an opportunity to ask questions and all were answered. The patient agreed with the plan and demonstrated an understanding of the instructions.   The patient was advised to call back or seek an in-person evaluation if the symptoms worsen or if the condition fails to improve as anticipated.    Sherlyn Hay, DO  [facility] [phone] (phone) [phone] (fax)  [facility]

## 2024-01-05 LAB — COMPREHENSIVE METABOLIC PANEL
ALT: 44 [IU]/L (ref 0–44)
AST: 34 [IU]/L (ref 0–40)
Albumin: 4.4 g/dL (ref 3.9–4.9)
Alkaline Phosphatase: 71 [IU]/L (ref 44–121)
BUN/Creatinine Ratio: 18 (ref 10–24)
BUN: 15 mg/dL (ref 8–27)
Bilirubin Total: 0.8 mg/dL (ref 0.0–1.2)
CO2: 28 mmol/L (ref 20–29)
Calcium: 9.8 mg/dL (ref 8.6–10.2)
Chloride: 98 mmol/L (ref 96–106)
Creatinine, Ser: 0.84 mg/dL (ref 0.76–1.27)
Globulin, Total: 3.4 g/dL (ref 1.5–4.5)
Glucose: 104 mg/dL — ABNORMAL HIGH (ref 70–99)
Potassium: 4.2 mmol/L (ref 3.5–5.2)
Sodium: 139 mmol/L (ref 134–144)
Total Protein: 7.8 g/dL (ref 6.0–8.5)
eGFR: 98 mL/min/{1.73_m2} (ref >59)

## 2024-01-05 LAB — LIPID PANEL
Chol/HDL Ratio: 3.7 {ratio} (ref 0.0–5.0)
Cholesterol, Total: 101 mg/dL (ref 100–199)
HDL: 27 mg/dL — ABNORMAL LOW (ref >39)
LDL Chol Calc (NIH): 46 mg/dL (ref 0–99)
Triglycerides: 167 mg/dL — ABNORMAL HIGH (ref 0–149)
VLDL Cholesterol Cal: 28 mg/dL (ref 5–40)

## 2024-01-05 LAB — MICROALBUMIN / CREATININE URINE RATIO
Creatinine, Urine: 151.9 mg/dL
Microalb/Creat Ratio: 15 mg/g{creat} (ref 0–29)
Microalbumin, Urine: 23 ug/mL

## 2024-01-05 LAB — HEMOGLOBIN A1C
Est. average glucose Bld gHb Est-mCnc: 126 mg/dL
Hgb A1c MFr Bld: 6 % — ABNORMAL HIGH (ref 4.8–5.6)

## 2024-01-09 ENCOUNTER — Encounter: Payer: Self-pay | Admitting: Family Medicine

## 2024-01-09 NOTE — Assessment & Plan Note (Signed)
Weight is 247 lbs, down from 254 lbs. Reports difficulty with weight loss due to decreased physical activity related to weather conditions. Continues to walk when weather permits. - Encourage continued physical activity as weather permits - Monitor weight and adjust treatment as needed

## 2024-01-09 NOTE — Assessment & Plan Note (Signed)
Blood sugars around 128-130 mg/dL. Current A1c is 6.9%. On Ozempic 1 mg with good tolerance, no significant side effects. Discussed increasing to 2 mg for further weight loss and glycemic control. Alternative of switching to [facility] discussed but insurance approval may be an issue. Also on Farxiga. - Increase Ozempic to 2 mg once current supply is confirmed - Order A1c, metabolic panel, and urine test for protein to monitor kidney function

## 2024-01-09 NOTE — Assessment & Plan Note (Signed)
On atorvastatin. Medication refill requested. - Refill atorvastatin prescription

## 2024-01-09 NOTE — Assessment & Plan Note (Addendum)
Refill zyrtec and singulair.

## 2024-01-09 NOTE — Assessment & Plan Note (Signed)
Fairly stable. On metoprolol. Medication refill requested. - Refill metoprolol prescription

## 2024-01-22 DIAGNOSIS — G4733 Obstructive sleep apnea (adult) (pediatric): Secondary | ICD-10-CM | POA: Diagnosis not present

## 2024-01-30 DIAGNOSIS — I10 Essential (primary) hypertension: Secondary | ICD-10-CM | POA: Diagnosis not present

## 2024-01-30 DIAGNOSIS — E782 Mixed hyperlipidemia: Secondary | ICD-10-CM | POA: Diagnosis not present

## 2024-01-30 DIAGNOSIS — I2510 Atherosclerotic heart disease of native coronary artery without angina pectoris: Secondary | ICD-10-CM | POA: Diagnosis not present

## 2024-02-19 DIAGNOSIS — G4733 Obstructive sleep apnea (adult) (pediatric): Secondary | ICD-10-CM | POA: Diagnosis not present

## 2024-02-19 NOTE — Progress Notes (Unsigned)
 02/20/24 2:08 PM   Justin Soto 02/02/1961 [id number]  Referring provider:  Sherlyn Hay, DO [address] [place],  [place] [address]  Urological history: T1c low risk prostate cancer  - PSA (07/2023) 2.0 - Biopsy 07/2018; abnormal PSA velocity (2.0 uncorrected); 48 g  - 4/12 cores; Gleason 3+3 adenocarcinoma - Elected active surveillance - MRI 08/2020; 31 g prostate; no high-grade lesions noted - Confirmatory biopsy 12/09/2020; 31 g volume -1/12 cores; Gleason 3+3 adenocarcinoma 5%  2.  BPH with LUTS - Combination therapy tamsulosin/finasteride  3.  History stone disease - Prior SWL - CT 06/2017 with 3 mm right upper pole calculus - CT 03/2022 - possible 2-3 mm left proximal ureteral stone   Chief Complaint  Patient presents with   Other    HPI: Justin Soto is a 63 y.o.male who presents today pain with sitting and pain with urination.  He is concerned that he may have a UTI.  Previous records reviewed.     For the last week, he has been having suprapubic pain.  He also has torn the end of his penis.  Patient denies any modifying or aggravating factors.  Patient denies any recent UTI's, gross hematuria, dysuria or suprapubic/flank pain.  Patient denies any fevers, chills, nausea or vomiting.    The pain is a deep dull ache waking him up at night.  It is relieved by urination.  He has not been sexually active, so he is unsure if there is pain with ejaculation.  UA yellow clear, 3+ glucose, specific gravity 1.015, pH 6.0, 0-5 WBCs, 0-2 RBCs and 0-10 epithelial cells.  He is also having issues with his foreskin, it is almost completely adhered to the glans.  He has torn section of the adhered area off his penis at the 3 o'clock position.  PMH: Past Medical History:  Diagnosis Date   Anemia    Arthritis    Diabetes mellitus without complication (HCC)    diet controlled   Dyspnea    Enlarged prostate    hx of   Environmental and seasonal allergies     Frequency of urination    GERD (gastroesophageal reflux disease)    History of kidney stones    HOH (hard of hearing)    wears bilateral hearing aids   Hypertension    Irregular heart beat    Pt feels this every once in a while; PCP aware but stated it was OK   Kidney stone on right side 01/15/2017   Leg pain, right    Take Lyrica and Naproxen   MRSA (methicillin resistant staph aureus) culture positive    Neck pain of over 3 months duration    Prostate cancer (HCC)    Sleep apnea    cpap-15 setting   Thalassemia    Tick fever     Surgical History: Past Surgical History:  Procedure Laterality Date   ANTERIOR CERVICAL DECOMP/DISCECTOMY FUSION N/A 11/13/2014   Procedure: Cervical three-four, Cervical six-seven anterior cervical decompression with fusion interbody prosthesis plating and bonegraft;  Surgeon: Tressie Stalker, MD;  Location: [facility] OR;  Service: Neurosurgery;  Laterality: N/A;  Cervical three-four, Cervical six-seven anterior cervical decompression with fusion interbody prosthesis plating and bonegraft   BACK SURGERY     Lumbar X 2   CALCANEAL OSTEOTOMY Right 01/11/2017   Procedure: CALCANEAL OSTEOTOMY;  Surgeon: Deeann Saint, MD;  Location: [facility] ORS;  Service: Orthopedics;  Laterality: Right;   CIRCUMCISION N/A 12/13/2017   Procedure:  PENILE BLOCK, LYSIS OF PENILE ADHESIONS;  Surgeon: Vanna Scotland, MD;  Location: [facility] ORS;  Service: Urology;  Laterality: N/A;   COLONOSCOPY     COLONOSCOPY WITH PROPOFOL N/A 09/15/2021   Procedure: COLONOSCOPY WITH PROPOFOL;  Surgeon: Toledo, Boykin Nearing, MD;  Location: [facility] ENDOSCOPY;  Service: Gastroenterology;  Laterality: N/A;  DM   CYSTOSCOPY N/A 12/13/2017   Procedure: CYSTOSCOPY;  Surgeon: Vanna Scotland, MD;  Location: [facility] ORS;  Service: Urology;  Laterality: N/A;   DIRECT LARYNGOSCOPY N/A 11/24/2020   Procedure: MICRO DIRECT LARYNGOSCOPY W/BIOPSY;  Surgeon: Linus Salmons, MD;  Location: [facility] ORS;  Service: ENT;  Laterality: N/A;    HERNIA REPAIR Left    groin   LEG SURGERY Right    Had surgery on right calf X 7   LITHOTRIPSY     LUMBAR WOUND DEBRIDEMENT N/A 11/28/2019   Procedure: LUMBAR WOUND DEBRIDEMENT;  Surgeon: Tressie Stalker, MD;  Location: [facility] OR;  Service: Neurosurgery;  Laterality: N/A;  LUMBAR WOUND DEBRIDEMENT   QUADRICEPS TENDON REPAIR Left 06/22/2015   Procedure: REPAIR QUADRICEP TENDON;  Surgeon: Deeann Saint, MD;  Location: [facility] ORS;  Service: Orthopedics;  Laterality: Left;   QUADRICEPS TENDON REPAIR Left 08/12/2015   Procedure: REPAIR QUADRICEP TENDON;  Surgeon: Deeann Saint, MD;  Location: [facility] ORS;  Service: Orthopedics;  Laterality: Left;   ROTATOR CUFF REPAIR Left    TONSILLECTOMY     VASECTOMY     with hydrocele repaired    Home Medications:  Allergies as of 02/20/2024       Reactions   Tizanidine Nausea Only   Methocarbamol Other (See Comments)   Numbness, and change of taste    Vicodin [hydrocodone-acetaminophen] Itching   Can take with benadryl         Medication List        Accurate as of February 20, 2024  2:08 PM. If you have any questions, ask your nurse or doctor.          amLODipine-benazepril 10-40 MG capsule Commonly known as: LOTREL Take 1 capsule by mouth daily.   aspirin EC 81 MG tablet Take 81 mg by mouth daily.   atorvastatin 40 MG tablet Commonly known as: LIPITOR Take 1 tablet (40 mg total) by mouth daily.   betamethasone dipropionate 0.05 % cream Apply topically 2 (two) times daily. Started by: Michiel Cowboy   cetirizine 10 MG tablet Commonly known as: ZYRTEC Take 1 tablet (10 mg total) by mouth daily. Take one tablet daily   cyclobenzaprine 10 MG tablet Commonly known as: FLEXERIL Take 1 tablet (10 mg total) by mouth at bedtime.   dapagliflozin propanediol 10 MG Tabs tablet Commonly known as: Farxiga TAKE 1 TABLET BY MOUTH EVERY DAY BEFORE BREAKFAST   finasteride 5 MG tablet Commonly known as: PROSCAR TAKE 1 TABLET (5 MG TOTAL) BY  MOUTH DAILY.   fluticasone 50 MCG/ACT nasal spray Commonly known as: FLONASE USE TWO SPRAYS IN EACH NOSTRIL EVERY DAY   gabapentin 300 MG capsule Commonly known as: NEURONTIN Take 600 mg by mouth 2 (two) times daily.   hydrochlorothiazide 25 MG tablet Commonly known as: HYDRODIURIL Take 1 tablet (25 mg total) by mouth daily.   metoprolol succinate 50 MG 24 hr tablet Commonly known as: TOPROL-XL Take 1 tablet (50 mg total) by mouth daily. TAKE WITH OR IMMEDIATELY FOLLOWING A MEAL.   montelukast 10 MG tablet Commonly known as: SINGULAIR Take 1 tablet (10 mg total) by mouth at bedtime.   multivitamin with minerals Tabs  tablet Take 1 tablet by mouth daily.   NON FORMULARY Pt uses a cpap nightly   omeprazole 20 MG capsule Commonly known as: PRILOSEC Take 20 mg by mouth daily.   Semaglutide (2 MG/DOSE) 8 MG/3ML Sopn Inject 2 mg as directed once a week.   sertraline 50 MG tablet Commonly known as: ZOLOFT TAKE 1 TABLET BY MOUTH EVERY DAY   sulfamethoxazole-trimethoprim 800-160 MG tablet Commonly known as: BACTRIM DS Take 1 tablet by mouth every 12 (twelve) hours. Started by: Michiel Cowboy   tamsulosin 0.4 MG Caps capsule Commonly known as: FLOMAX TAKE 2 CAPSULES BY MOUTH EVERY DAY        Allergies:  Allergies  Allergen Reactions   Tizanidine Nausea Only   Methocarbamol Other (See Comments)    Numbness, and change of taste    Vicodin [Hydrocodone-Acetaminophen] Itching    Can take with benadryl     Family History: Family History  Problem Relation Age of Onset   Prostate cancer Father    Heart disease Father    Diabetes Father    Alzheimer's disease Father    Anemia Mother    Osteoporosis Mother    Healthy Sister    Diabetes Brother    Hypertension Brother    Anemia Daughter    Diabetes Brother    Kidney disease Neg Hx    Bladder Cancer Neg Hx    Colon cancer Neg Hx     Social History:  reports that he has never smoked. He has never been  exposed to tobacco smoke. He has never used smokeless tobacco. He reports that he does not drink alcohol and does not use drugs.   Physical Exam: BP (!) 156/89   Pulse 74   Ht 5\' 6"  (1.676 m)   Wt 245 lb (111.1 kg)   BMI 39.54 kg/m   Constitutional:  Well nourished. Alert and oriented, No acute distress. HEENT: [place] AT, moist mucus membranes.  Trachea midline Cardiovascular: No clubbing, cyanosis, or edema. Respiratory: Normal respiratory effort, no increased work of breathing. GU: No CVA tenderness.  No bladder fullness or masses.  Patient with uncircumcised phallus.  Foreskin could not be retracted due to glans adhesions.  Urethral meatus is patent.  No penile discharge. No penile lesions or rashes. Scrotum without lesions, cysts, rashes and/or edema.  Testicles are located scrotally bilaterally. No masses are appreciated in the testicles. Left and right epididymis are normal. Rectal: Patient with  normal sphincter tone. Anus and perineum without scarring or rashes. No rectal masses are appreciated. Prostate is approximately 50 +  grams, could not palpate the entire gland due to prostate size, no nodules are appreciated. Seminal vesicles could not be palpated.  Neurologic: Grossly intact, no focal deficits, moving all 4 extremities. Psychiatric: Normal mood and affect.   Laboratory Data: Lipid Panel     Component Value Date/Time   CHOL 101 01/04/2024 0913   TRIG 167 (H) 01/04/2024 0913   HDL 27 (L) 01/04/2024 0913   CHOLHDL 3.7 01/04/2024 0913   LDLCALC 46 01/04/2024 0913   LABVLDL 28 01/04/2024 0913    Component     Latest Ref Rng 01/04/2024  Hemoglobin A1C     4.8 - 5.6 % 6.0 (H)   Est. average glucose Bld gHb Est-mCnc     mg/dL 469     Legend: (H) High  CMP     Component Value Date/Time   NA 139 01/04/2024 0913   NA 138 08/11/2014 1230   K 4.2  01/04/2024 0913   K 4.1 08/11/2014 1230   CL 98 01/04/2024 0913   CL 104 08/11/2014 1230   CO2 28 01/04/2024 0913   CO2 28  08/11/2014 1230   GLUCOSE 104 (H) 01/04/2024 0913   GLUCOSE 149 (H) 10/13/2022 0545   GLUCOSE 104 (H) 08/11/2014 1230   BUN 15 01/04/2024 0913   BUN 11 08/11/2014 1230   CREATININE 0.84 01/04/2024 0913   CREATININE 1.01 08/11/2014 1230   CALCIUM 9.8 01/04/2024 0913   CALCIUM 8.5 08/11/2014 1230   PROT 7.8 01/04/2024 0913   PROT 7.7 08/11/2014 1230   ALBUMIN 4.4 01/04/2024 0913   ALBUMIN 4.0 08/11/2014 1230   AST 34 01/04/2024 0913   AST 16 08/11/2014 1230   ALT 44 01/04/2024 0913   ALT 28 08/11/2014 1230   ALKPHOS 71 01/04/2024 0913   ALKPHOS 55 08/11/2014 1230   BILITOT 0.8 01/04/2024 0913   BILITOT 0.6 08/11/2014 1230   EGFR 98 01/04/2024 0913   GFRNONAA >60 10/13/2022 0545   GFRNONAA >60 08/11/2014 1230    CBC    Component Value Date/Time   WBC 5.2 08/28/2023 1129   WBC 9.3 10/13/2022 0545   RBC 5.51 08/28/2023 1129   HGB 11.0 (L) 08/28/2023 1129   HGB 10.9 (L) 07/25/2023 0901   HCT 35.4 (L) 08/28/2023 1129   HCT 35.3 (L) 07/25/2023 0901   PLT 160 08/28/2023 1129   PLT 183 07/25/2023 0901   MCV 64.2 (L) 08/28/2023 1129   MCV 65 (L) 07/25/2023 0901   MCV 61 (L) 08/11/2014 1230   MCH 20.0 (L) 08/28/2023 1129   MCHC 31.1 08/28/2023 1129   RDW 16.9 (H) 08/28/2023 1129   RDW 16.8 (H) 07/25/2023 0901   RDW 16.3 (H) 08/11/2014 1230   LYMPHSABS 1.2 07/25/2023 0901   LYMPHSABS 1.3 08/11/2014 1230   MONOABS 0.6 01/31/2022 1546   MONOABS 0.5 08/11/2014 1230   EOSABS 0.5 (H) 07/25/2023 0901   EOSABS 0.2 08/11/2014 1230   BASOSABS 0.0 07/25/2023 0901   BASOSABS 0.0 08/11/2014 1230    Urinalysis See EPIC and HPI I have reviewed the labs.  See HPI.      Pertinent imaging:  N/A  Assessment & Plan:    1. Prostatitis -UA benign -prostate tender on DRE -Started empirically on Septra DS twice daily for 30 days -follow up in one month  2. Prostate cancer -on active surveillance -will recheck PSA on return if asymptomatic  3. Penile adhesions -refilled  diprolene cream  -he would like this surgically addressed  -will discuss with Dr. Apolinar Junes   Return in about 1 month (around 03/22/2024) for symptom recheck .  Cloretta Ned   [facility] [address] [place], [place] [address] [phone]

## 2024-02-20 ENCOUNTER — Ambulatory Visit: Admitting: Urology

## 2024-02-20 ENCOUNTER — Encounter: Payer: Self-pay | Admitting: Urology

## 2024-02-20 VITALS — BP 156/89 | HR 74 | Ht 66.0 in | Wt 245.0 lb

## 2024-02-20 DIAGNOSIS — R3989 Other symptoms and signs involving the genitourinary system: Secondary | ICD-10-CM

## 2024-02-20 DIAGNOSIS — C61 Malignant neoplasm of prostate: Secondary | ICD-10-CM

## 2024-02-20 DIAGNOSIS — N478 Other disorders of prepuce: Secondary | ICD-10-CM

## 2024-02-20 DIAGNOSIS — N419 Inflammatory disease of prostate, unspecified: Secondary | ICD-10-CM

## 2024-02-20 LAB — MICROSCOPIC EXAMINATION: Bacteria, UA: NONE SEEN

## 2024-02-20 LAB — URINALYSIS, COMPLETE
Bilirubin, UA: NEGATIVE
Ketones, UA: NEGATIVE
Leukocytes,UA: NEGATIVE
Nitrite, UA: NEGATIVE
Protein,UA: NEGATIVE
RBC, UA: NEGATIVE
Specific Gravity, UA: 1.015 (ref 1.005–1.030)
Urobilinogen, Ur: 0.2 mg/dL (ref 0.2–1.0)
pH, UA: 6 (ref 5.0–7.5)

## 2024-02-20 MED ORDER — BETAMETHASONE DIPROPIONATE 0.05 % EX CREA: TOPICAL_CREAM | Freq: Two times a day (BID) | CUTANEOUS | 0 refills | Status: DC

## 2024-02-20 MED ORDER — SULFAMETHOXAZOLE-TRIMETHOPRIM 800-160 MG PO TABS: 1.0000 | ORAL_TABLET | Freq: Two times a day (BID) | ORAL | 0 refills | Status: DC

## 2024-02-27 ENCOUNTER — Other Ambulatory Visit: Payer: Self-pay

## 2024-02-27 MED ORDER — FLUCONAZOLE 100 MG PO TABS: 100.0000 mg | ORAL_TABLET | Freq: Every day | ORAL | 0 refills | Status: DC

## 2024-03-13 DIAGNOSIS — D3132 Benign neoplasm of left choroid: Secondary | ICD-10-CM | POA: Diagnosis not present

## 2024-03-13 DIAGNOSIS — H40003 Preglaucoma, unspecified, bilateral: Secondary | ICD-10-CM | POA: Diagnosis not present

## 2024-03-13 DIAGNOSIS — H2513 Age-related nuclear cataract, bilateral: Secondary | ICD-10-CM | POA: Diagnosis not present

## 2024-03-13 DIAGNOSIS — E119 Type 2 diabetes mellitus without complications: Secondary | ICD-10-CM | POA: Diagnosis not present

## 2024-03-13 LAB — HM DIABETES EYE EXAM

## 2024-03-14 NOTE — Progress Notes (Signed)
 03/17/24 8:41 AM   Justin Soto [birth year] [id number]  Referring provider:  Sherlyn Hay, DO [address] [place],  [place] [address]  Urological history: T1c low risk prostate cancer  - PSA (07/2023) 2.0 - Biopsy 07/2018; abnormal PSA velocity (2.0 uncorrected); 48 g  - 4/12 cores; Gleason 3+3 adenocarcinoma - Elected active surveillance - MRI 08/2020; 31 g prostate; no high-grade lesions noted - Confirmatory biopsy 12/09/2020; 31 g volume -1/12 cores; Gleason 3+3 adenocarcinoma 5%  2.  BPH with LUTS - Combination therapy tamsulosin/finasteride  3.  History stone disease - Prior SWL - CT 06/2017 with 3 mm right upper pole calculus - CT 03/2022 - possible 2-3 mm left proximal ureteral stone   Chief Complaint  Patient presents with   Follow-up    HPI: Justin Soto is a 63 y.o.male who presents today for penile pain getting worse.    Previous records reviewed.     At his visit on 02/20/2024, For the last week, he has been having suprapubic pain.  He also has torn the end of his penis.  Patient denies any modifying or aggravating factors.  Patient denies any recent UTI's, gross hematuria, dysuria or suprapubic/flank pain.  Patient denies any fevers, chills, nausea or vomiting.  The pain is a deep dull ache waking him up at night.  It is relieved by urination.  He has not been sexually active, so he is unsure if there is pain with ejaculation.  UA yellow clear, 3+ glucose, specific gravity 1.015, pH 6.0, 0-5 WBCs, 0-2 RBCs and 0-10 epithelial cells.  He is also having issues with his foreskin, it is almost completely adhered to the glans.  He has torn section of the adhered area off his penis at the 3 o'clock position.  He was placed on Septra DS and prescribed Diprolene cream.    He is no longer experiencing suprapubic pain.  He is however, experiencing penile pain when his urine stream hits the areas on his penis and causes burning.   Patient denies any modifying  or aggravating factors.  Patient denies any recent UTI's, gross hematuria, dysuria or suprapubic/flank pain.  Patient denies any fevers, chills, nausea or vomiting.     PMH: Past Medical History:  Diagnosis Date   Anemia    Arthritis    Diabetes mellitus without complication (HCC)    diet controlled   Dyspnea    Enlarged prostate    hx of   Environmental and seasonal allergies    Frequency of urination    GERD (gastroesophageal reflux disease)    History of kidney stones    HOH (hard of hearing)    wears bilateral hearing aids   Hypertension    Irregular heart beat    Pt feels this every once in a while; PCP aware but stated it was OK   Kidney stone on right side 01/15/2017   Leg pain, right    Take Lyrica and Naproxen   MRSA (methicillin resistant staph aureus) culture positive    Neck pain of over 3 months duration    Prostate cancer (HCC)    Sleep apnea    cpap-15 setting   Thalassemia    Tick fever     Surgical History: Past Surgical History:  Procedure Laterality Date   ANTERIOR CERVICAL DECOMP/DISCECTOMY FUSION N/A 11/13/2014   Procedure: Cervical three-four, Cervical six-seven anterior cervical decompression with fusion interbody prosthesis plating and bonegraft;  Surgeon: Tressie Stalker, MD;  Location: [facility];  Service: Neurosurgery;  Laterality: N/A;  Cervical three-four, Cervical six-seven anterior cervical decompression with fusion interbody prosthesis plating and bonegraft   BACK SURGERY     Lumbar X 2   CALCANEAL OSTEOTOMY Right 01/11/2017   Procedure: CALCANEAL OSTEOTOMY;  Surgeon: Deeann Saint, MD;  Location: [facility] ORS;  Service: Orthopedics;  Laterality: Right;   CIRCUMCISION N/A 12/13/2017   Procedure: PENILE BLOCK, LYSIS OF PENILE ADHESIONS;  Surgeon: Vanna Scotland, MD;  Location: [facility] ORS;  Service: Urology;  Laterality: N/A;   COLONOSCOPY     COLONOSCOPY WITH PROPOFOL N/A 09/15/2021   Procedure: COLONOSCOPY WITH PROPOFOL;  Surgeon: Toledo, Boykin Nearing, MD;   Location: [facility] ENDOSCOPY;  Service: Gastroenterology;  Laterality: N/A;  DM   CYSTOSCOPY N/A 12/13/2017   Procedure: CYSTOSCOPY;  Surgeon: Vanna Scotland, MD;  Location: [facility] ORS;  Service: Urology;  Laterality: N/A;   DIRECT LARYNGOSCOPY N/A 11/24/2020   Procedure: MICRO DIRECT LARYNGOSCOPY W/BIOPSY;  Surgeon: Linus Salmons, MD;  Location: [facility] ORS;  Service: ENT;  Laterality: N/A;   HERNIA REPAIR Left    groin   LEG SURGERY Right    Had surgery on right calf X 7   LITHOTRIPSY     LUMBAR WOUND DEBRIDEMENT N/A 11/28/2019   Procedure: LUMBAR WOUND DEBRIDEMENT;  Surgeon: Tressie Stalker, MD;  Location: [facility] OR;  Service: Neurosurgery;  Laterality: N/A;  LUMBAR WOUND DEBRIDEMENT   QUADRICEPS TENDON REPAIR Left 06/22/2015   Procedure: REPAIR QUADRICEP TENDON;  Surgeon: Deeann Saint, MD;  Location: [facility] ORS;  Service: Orthopedics;  Laterality: Left;   QUADRICEPS TENDON REPAIR Left 08/12/2015   Procedure: REPAIR QUADRICEP TENDON;  Surgeon: Deeann Saint, MD;  Location: [facility] ORS;  Service: Orthopedics;  Laterality: Left;   ROTATOR CUFF REPAIR Left    TONSILLECTOMY     VASECTOMY     with hydrocele repaired    Home Medications:  Allergies as of 03/15/2024       Reactions   Tizanidine Nausea Only   Methocarbamol Other (See Comments)   Numbness, and change of taste    Vicodin [hydrocodone-acetaminophen] Itching   Can take with benadryl         Medication List        Accurate as of March 15, 2024 11:59 PM. If you have any questions, ask your nurse or doctor.          STOP taking these medications    fluconazole 100 MG tablet Commonly known as: Diflucan       TAKE these medications    amLODipine-benazepril 10-40 MG capsule Commonly known as: LOTREL Take 1 capsule by mouth daily.   aspirin EC 81 MG tablet Take 81 mg by mouth daily.   atorvastatin 40 MG tablet Commonly known as: LIPITOR Take 1 tablet (40 mg total) by mouth daily.   betamethasone dipropionate 0.05 %  cream Apply topically 2 (two) times daily.   cetirizine 10 MG tablet Commonly known as: ZYRTEC Take 1 tablet (10 mg total) by mouth daily. Take one tablet daily   cyclobenzaprine 10 MG tablet Commonly known as: FLEXERIL Take 1 tablet (10 mg total) by mouth at bedtime.   dapagliflozin propanediol 10 MG Tabs tablet Commonly known as: Farxiga TAKE 1 TABLET BY MOUTH EVERY DAY BEFORE BREAKFAST   finasteride 5 MG tablet Commonly known as: PROSCAR TAKE 1 TABLET (5 MG TOTAL) BY MOUTH DAILY.   fluticasone 50 MCG/ACT nasal spray Commonly known as: FLONASE USE TWO SPRAYS IN EACH NOSTRIL EVERY DAY   gabapentin 300  MG capsule Commonly known as: NEURONTIN Take 600 mg by mouth 2 (two) times daily.   hydrochlorothiazide 25 MG tablet Commonly known as: HYDRODIURIL Take 1 tablet (25 mg total) by mouth daily.   metoprolol succinate 50 MG 24 hr tablet Commonly known as: TOPROL-XL Take 1 tablet (50 mg total) by mouth daily. TAKE WITH OR IMMEDIATELY FOLLOWING A MEAL.   montelukast 10 MG tablet Commonly known as: SINGULAIR Take 1 tablet (10 mg total) by mouth at bedtime.   multivitamin with minerals Tabs tablet Take 1 tablet by mouth daily.   NON FORMULARY Pt uses a cpap nightly   omeprazole 20 MG capsule Commonly known as: PRILOSEC Take 20 mg by mouth daily.   Semaglutide (2 MG/DOSE) 8 MG/3ML Sopn Inject 2 mg as directed once a week.   sertraline 50 MG tablet Commonly known as: ZOLOFT TAKE 1 TABLET BY MOUTH EVERY DAY   sulfamethoxazole-trimethoprim 800-160 MG tablet Commonly known as: BACTRIM DS Take 1 tablet by mouth every 12 (twelve) hours.   tamsulosin 0.4 MG Caps capsule Commonly known as: FLOMAX TAKE 2 CAPSULES BY MOUTH EVERY DAY        Allergies:  Allergies  Allergen Reactions   Tizanidine Nausea Only   Methocarbamol Other (See Comments)    Numbness, and change of taste    Vicodin [Hydrocodone-Acetaminophen] Itching    Can take with benadryl     Family  History: Family History  Problem Relation Age of Onset   Prostate cancer Father    Heart disease Father    Diabetes Father    Alzheimer's disease Father    Anemia Mother    Osteoporosis Mother    Healthy Sister    Diabetes Brother    Hypertension Brother    Anemia Daughter    Diabetes Brother    Kidney disease Neg Hx    Bladder Cancer Neg Hx    Colon cancer Neg Hx     Social History:  reports that he has never smoked. He has never been exposed to tobacco smoke. He has never used smokeless tobacco. He reports that he does not drink alcohol and does not use drugs.   Physical Exam: BP 138/82   Pulse 97   Ht 5\' 6"  (1.676 m)   Wt 241 lb (109.3 kg)   BMI 38.90 kg/m   Constitutional:  Well nourished. Alert and oriented, No acute distress. HEENT: [place] AT, moist mucus membranes.  Trachea midline Cardiovascular: No clubbing, cyanosis, or edema. Respiratory: Normal respiratory effort, no increased work of breathing. Neurologic: Grossly intact, no focal deficits, moving all 4 extremities. Psychiatric: Normal mood and affect.    Laboratory Data: N/A    Pertinent imaging:  N/A  Assessment & Plan:    1. Prostatitis -resolved  2. Prostate cancer -on active surveillance -will recheck PSA on return if asymptomatic -will recheck PSA when he recovers from procedure  3. Penile adhesions -refilled diprolene cream  -he would like this surgically addressed  -explained that we will go ahead and plan for adhesion take down and possibly biopsy, but he needs to be diligent about his aftercare as adhesions has a high propensity to recur -the risk of the surgery are pain, penile deformity, infection and the risks of general surgery -he understands the risks and want to proceed   Return for lysis penile adhesion, cysto and possible penile biopsy .  Michiel Cowboy, PA-C   [facility] [address] [place], [place] [address] [phone]  [phone]   Surgical Physician Order Form [facility]  * Scheduling expectation : ASAP w/ Dr. Apolinar Junes   *Length of Case:   *Clearance needed: no  *Anticoagulation Instructions: Hold all anticoagulants  *Aspirin Instructions: Hold ASA   *Post-op visit Date/Instructions:   TBD  *Diagnosis:  Penile adhesions   *Procedure:   Lysis of penile adhesions, penile block, possible penile biopsy  and flexible cystoscopy    Additional orders: N/A  -Admit type: OUTpatient  -Anesthesia: General  -VTE Prophylaxis Standing Order SCD's       Other:   -Standing Lab Orders Per Anesthesia    Lab other: UA&Urine Culture  -Standing Test orders EKG/Chest x-ray per Anesthesia       Test other:   - Medications:  Ancef 2gm IV  -Other orders:  N/A

## 2024-03-15 ENCOUNTER — Ambulatory Visit (INDEPENDENT_AMBULATORY_CARE_PROVIDER_SITE_OTHER): Admitting: Urology

## 2024-03-15 VITALS — BP 138/82 | HR 97 | Ht 66.0 in | Wt 241.0 lb

## 2024-03-15 DIAGNOSIS — N419 Inflammatory disease of prostate, unspecified: Secondary | ICD-10-CM

## 2024-03-15 DIAGNOSIS — C61 Malignant neoplasm of prostate: Secondary | ICD-10-CM | POA: Diagnosis not present

## 2024-03-15 DIAGNOSIS — N478 Other disorders of prepuce: Secondary | ICD-10-CM

## 2024-03-15 LAB — URINALYSIS, COMPLETE
Bilirubin, UA: NEGATIVE
Leukocytes,UA: NEGATIVE
Nitrite, UA: NEGATIVE
Protein,UA: NEGATIVE
RBC, UA: NEGATIVE
Specific Gravity, UA: 1.015 (ref 1.005–1.030)
Urobilinogen, Ur: 0.2 mg/dL (ref 0.2–1.0)
pH, UA: 6 (ref 5.0–7.5)

## 2024-03-15 LAB — MICROSCOPIC EXAMINATION

## 2024-03-17 ENCOUNTER — Encounter: Payer: Self-pay | Admitting: Urology

## 2024-03-18 ENCOUNTER — Other Ambulatory Visit: Payer: Self-pay

## 2024-03-18 DIAGNOSIS — N478 Other disorders of prepuce: Secondary | ICD-10-CM

## 2024-03-18 NOTE — Progress Notes (Unsigned)
 Surgical Physician Order Form [facility] [place]  * Scheduling expectation : ASAP w/ Dr. Apolinar Junes   *Length of Case:   *Clearance needed: no  *Anticoagulation Instructions: Hold all anticoagulants  *Aspirin Instructions: Hold ASA   *Post-op visit Date/Instructions:   TBD  *Diagnosis:  Penile adhesions   *Procedure:   Lysis of penile adhesions, penile block, possible penile biopsy  and flexible cystoscopy    Additional orders: N/A  -Admit type: OUTpatient  -Anesthesia: General  -VTE Prophylaxis Standing Order SCD's       Other:   -Standing Lab Orders Per Anesthesia    Lab other: UA&Urine Culture  -Standing Test orders EKG/Chest x-ray per Anesthesia       Test other:   - Medications:  Ancef 2gm IV  -Other orders:  N/A

## 2024-03-19 ENCOUNTER — Telehealth: Payer: Self-pay

## 2024-03-19 NOTE — Progress Notes (Addendum)
   [facility] Surgical Posting Form  Surgery Date: Date: 04/01/2024  Surgeon: Dr. 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

## 2024-03-19 NOTE — Progress Notes (Addendum)
  Phone Number: [phone] for Surgical Coordinator Fax Number: [phone]  REQUEST FOR SURGICAL CLEARANCE     Date: Date: 03/19/24  Faxed to: Dr. Corky Sing, MD  Surgeon: Dr. Vanna Scotland, MD     Date of Surgery: 04/01/2024  Operation: Lysis of Penile Adhesion, Flexible Cystoscopy and Possible Penile Biopsy   Anesthesia Type: General   Diagnosis: Penile Adhesions  Patient Requires:   Cardiac / Vascular Clearance : Yes  Reason: Would like for patient to hold 81mg  ASA prior to surgery.   Risk Assessment:    Low   []       Moderate   []     High   []           This patient is optimized for surgery  YES []       NO   []    I recommend further assessment/workup prior to surgery. YES []      NO  []   Appointment scheduled for: _______________________   Further recommendations: ____________________________________     Physician Signature:__________________________________   Printed Name: ________________________________________   Date: _________________

## 2024-03-19 NOTE — Telephone Encounter (Signed)
  Per Dr. Apolinar Junes, Patient is to be scheduled for Lysis of Penile Adhesion, Flexible Cystoscopy and Possible Penile Biopsy   Justin Soto was contacted and possible surgical dates were discussed, Monday April 21st, 2025 was agreed upon for surgery.   Patient was instructed that Dr. Apolinar Junes will require them to provide a pre-op UA & CX prior to surgery. This was ordered and scheduled drop off appointment was made for 03/20/2024.    Patient was directed to call [phone] between 1-3pm the day before surgery to find out surgical arrival time.  Instructions were given not to eat or drink from midnight on the night before surgery and have a driver for the day of surgery. On the surgery day patient was instructed to enter through the [facility] entrance of [facility] report the Same Day Surgery desk.   Pre-Admit Testing will be in contact via phone to set up an interview with the anesthesia team to review your history and medications prior to surgery.   Reminder of this information was sent via MyChart to the patient.

## 2024-03-20 ENCOUNTER — Other Ambulatory Visit

## 2024-03-20 DIAGNOSIS — N478 Other disorders of prepuce: Secondary | ICD-10-CM

## 2024-03-20 LAB — URINALYSIS, COMPLETE
Bilirubin, UA: NEGATIVE
Ketones, UA: NEGATIVE
Leukocytes,UA: NEGATIVE
Nitrite, UA: NEGATIVE
Protein,UA: NEGATIVE
RBC, UA: NEGATIVE
Specific Gravity, UA: 1.015 (ref 1.005–1.030)
Urobilinogen, Ur: 0.2 mg/dL (ref 0.2–1.0)
pH, UA: 6 (ref 5.0–7.5)

## 2024-03-20 LAB — MICROSCOPIC EXAMINATION: Bacteria, UA: NONE SEEN

## 2024-03-26 ENCOUNTER — Encounter: Payer: Self-pay | Admitting: Urology

## 2024-03-26 LAB — CULTURE, URINE COMPREHENSIVE

## 2024-03-29 ENCOUNTER — Encounter: Payer: Self-pay | Admitting: Urgent Care

## 2024-03-29 ENCOUNTER — Encounter: Payer: Self-pay | Admitting: Urology

## 2024-03-29 ENCOUNTER — Inpatient Hospital Stay
Admission: RE | Admit: 2024-03-29 | Discharge: 2024-03-29 | Disposition: A | Discharge status: Home / Self Care | Source: Ambulatory Visit

## 2024-03-29 HISTORY — DX: Spinal stenosis, lumbar region with neurogenic claudication: M48.062

## 2024-03-29 HISTORY — DX: Morbid (severe) obesity due to excess calories: E66.01

## 2024-03-29 HISTORY — DX: Type 2 diabetes mellitus without complications: E11.9

## 2024-03-29 HISTORY — DX: Adhesions of prepuce and glans penis: N47.5

## 2024-03-29 HISTORY — DX: Unilateral inguinal hernia, without obstruction or gangrene, not specified as recurrent: K40.90

## 2024-03-29 HISTORY — DX: Attention-deficit hyperactivity disorder, unspecified type: F90.9

## 2024-03-29 HISTORY — DX: Anxiety disorder, unspecified: F41.9

## 2024-03-29 HISTORY — DX: Hyperlipidemia, unspecified: E78.5

## 2024-03-29 HISTORY — DX: Umbilical hernia without obstruction or gangrene: K42.9

## 2024-03-29 NOTE — Progress Notes (Addendum)
  Perioperative Services Pre-Admission/Anesthesia Testing    Date: 03/29/24  Name: Justin Soto MRN:   [id number]  Re: Plans for surgery  Planned Surgical Procedure(s):    Case: [id number] Date/Time: 04/01/24 0730   Procedures:      DESTRUCTION, LESION, PENIS     BIOPSY, PENIS     CYSTOSCOPY   Anesthesia type: General   Diagnosis: Penile adhesions [N47.5]   Pre-op diagnosis: Penile Adhesions   Location: [facility] OR ROOM 10 / [facility]   Surgeons: Dustin Gimenez, MD      Clinical Notes:  Patient is scheduled for the above procedure on 04/01/2024 with Dr. Dustin Gimenez, MD.  Preparation for his procedure, patient participated in preoperative interview on the afternoon of 03/29/2024.  During the course of the interview, it was determined that patient has not appropriately held his SGLT2i (dapagliflozin ) x 3 days and he is GLP-1 (semaglutide ) x 7 days.  Last dose of both his dapagliflozin  and semaglutide  were today (03/29/2024).  Patient advises that the office told him to stop his aspirin and meloxicam , however did not advise him on the need to hold his SGLT2i and GLP-1 medications.    Unfortunately, these medications place patient at increased risk for complications such as the DKA and pulmonary aspiration while under anesthesia.  Additionally, patient has a cardiovascular history and clearance from his primary attending cardiologist was requested on 03/19/2024.  To date, clearance has not been received.  Primary attending surgeons office was called to discuss the aforementioned.  Given the fact that patient has not been cleared by cardiology and he has not had an adequate washout period for 2 of his high risk medications, it was determined that patient's elective case would need to be postponed.  Patient has been made aware of this by PAT staff.  Surgeons office to remove patient's case from the OR board for 04/01/2024.  Case to be rescheduled once patient has received  cardiac clearance.  Office will need to ensure appropriate hold periods for both his SGLT2i and GLP-1 prior to patient being rescheduled.  No further needs from the PAT department at this time.  Renate Caroline, MSN, APRN, FNP-C, CEN [facility]  Perioperative Services Nurse Practitioner Phone: [phone] Fax: [phone] 03/29/24 1:19 PM  NOTE: This note has been prepared using Dragon dictation software. Despite my best ability to proofread, there is always the potential that unintentional transcriptional errors may still occur from this process.

## 2024-03-29 NOTE — Progress Notes (Signed)
 Called pt to do his anesthesia interview for upcoming surgery on Monday 4-21 with Dr Ace Holder. After going over medications pt states he took his Semaglutide  (Ozempic ) this morning. I informed pt that he should have stopped that 7 days prior to surgery and that anesthesia will not do his surgery since he has not been off of it for 7 days. Pt states no one from Dr Lindsey Rhodes office notified him to stop. I informed pt of the reason that these GLP-1 are held and pt verbalized understanding. Pt is aware that once he is put back on for surgery that he will need to stop that for 7 days. Pt aware. Dr Lindsey Rhodes office is closed for Good Friday, Renate Caroline NP is reaching out to [facility] surgery scheduler to inform her and to get pt taken off the OR board.

## 2024-04-01 ENCOUNTER — Ambulatory Visit: Admission: RE | Admit: 2024-04-01 | Source: Ambulatory Visit | Admitting: Urology

## 2024-04-01 HISTORY — DX: Obstructive sleep apnea (adult) (pediatric): G47.33

## 2024-04-03 NOTE — H&P (View-Only) (Signed)
 04/04/24 2:27 PM   Justin Soto [birth year] [id number]  Referring provider:  Carlean Charter, DO [address] [place],  [place] [address]  Urological history: T1c low risk prostate cancer  - PSA (07/2023) 2.0 - Biopsy 07/2018; abnormal PSA velocity (2.0 uncorrected); 48 g  - 4/12 cores; Gleason 3+3 adenocarcinoma - Elected active surveillance - MRI 08/2020; 31 g prostate; no high-grade lesions noted - Confirmatory biopsy 12/09/2020; 31 g volume -1/12 cores; Gleason 3+3 adenocarcinoma 5%  2.  BPH with LUTS - Combination therapy tamsulosin /finasteride   3.  History stone disease - Prior SWL - CT 06/2017 with 3 mm right upper pole calculus - CT 03/2022 - possible 2-3 mm left proximal ureteral stone   Chief Complaint  Patient presents with   Penis Pain    HPI: Justin Soto is a 63 y.o.male who presents today for penile pain getting worse.    Previous records reviewed.     He was scheduled for takedown of his penile adhesions on April 01, 2024, but unfortunately he was not instructed to discontinue his Farxiga  and his semaglutide .  His case needed to be postponed.  He states he feels that it is infected now.  He has been noticing a whitish discharge that is pretty copious especially in the a.m.  Is also having some burning through the shaft as well.  Patient denies any modifying or aggravating factors.  Patient denies any recent UTI's, gross hematuria, dysuria or suprapubic/flank pain.  Patient denies any fevers, chills, nausea or vomiting.    PMH: Past Medical History:  Diagnosis Date   ADHD (attention deficit hyperactivity disorder)    Anemia    Anxiety    Arthritis    DM (diabetes mellitus), type 2 (HCC)    Dyspnea    Enlarged prostate    hx of   Environmental and seasonal allergies    Frequency of urination    GERD (gastroesophageal reflux disease)    History of kidney stones    HOH (hard of hearing)    wears bilateral hearing aids   Hyperlipidemia     Hypertension    Inguinal hernia    Irregular heart beat    Pt feels this every once in a while; PCP aware but stated it was OK   Kidney stone on right side 01/15/2017   Leg pain, right    Take Lyrica  and Naproxen   Lumbar stenosis with neurogenic claudication    Morbid obesity (HCC)    MRSA (methicillin resistant staph aureus) culture positive    Neck pain of over 3 months duration    OSA on CPAP    Penile adhesions    Prostate cancer (HCC)    Thalassemia    Tick fever    Umbilical hernia     Surgical History: Past Surgical History:  Procedure Laterality Date   ANTERIOR CERVICAL DECOMP/DISCECTOMY FUSION N/A 11/13/2014   Procedure: Cervical three-four, Cervical six-seven anterior cervical decompression with fusion interbody prosthesis plating and bonegraft;  Surgeon: Garry Kansas, MD;  Location: [facility] OR;  Service: Neurosurgery;  Laterality: N/A;  Cervical three-four, Cervical six-seven anterior cervical decompression with fusion interbody prosthesis plating and bonegraft   BACK SURGERY     Lumbar X 2   CALCANEAL OSTEOTOMY Right 01/11/2017   Procedure: CALCANEAL OSTEOTOMY;  Surgeon: Marlynn Singer, MD;  Location: [facility] ORS;  Service: Orthopedics;  Laterality: Right;   CIRCUMCISION N/A 12/13/2017   Procedure: PENILE BLOCK, LYSIS OF PENILE ADHESIONS;  Surgeon: Ace Holder,  Odilia Bennett, MD;  Location: [facility] OR[facility];  Service: Urology;  Laterality: N/A;   COLONOSCOPY     COLONOSCOPY WITH PROPOFOL  N/A 09/15/2021   Procedure: COLONOSCOPY WITH PROPOFOL ;  Surgeon: Toledo, Alphonsus Jeans, MD;  Location: [facility];  Service: Gastroenterology;  Laterality: N/A;  DM   CYSTOSCOPY N/A 12/13/2017   Procedure: CYSTOSCOPY;  Surgeon: Dustin Gimenez, MD;  Location: [facility] OR[facility];  Service: Urology;  Laterality: N/A;   DIRECT LARYNGOSCOPY N/A 11/24/2020   Procedure: MICRO DIRECT LARYNGOSCOPY W/BIOPSY;  Surgeon: Lesly Raspberry, MD;  Location: [facility] ORS;  Service: ENT;  Laterality: N/A;   HERNIA REPAIR Left    groin   LEG  SURGERY Right    Had surgery on right calf X 7   LITHOTRIPSY     LUMBAR WOUND DEBRIDEMENT N/A 11/28/2019   Procedure: LUMBAR WOUND DEBRIDEMENT;  Surgeon: Garry Kansas, MD;  Location: [facility] OR;  Service: Neurosurgery;  Laterality: N/A;  LUMBAR WOUND DEBRIDEMENT   QUADRICEPS TENDON REPAIR Left 06/22/2015   Procedure: REPAIR QUADRICEP TENDON;  Surgeon: Marlynn Singer, MD;  Location: [facility] OR[facility];  Service: Orthopedics;  Laterality: Left;   QUADRICEPS TENDON REPAIR Left 08/12/2015   Procedure: REPAIR QUADRICEP TENDON;  Surgeon: Marlynn Singer, MD;  Location: [facility] OR[facility];  Service: Orthopedics;  Laterality: Left;   ROTATOR CUFF REPAIR Left    TONSILLECTOMY     VASECTOMY     with hydrocele repaired    Home Medications:  Allergies as of 04/04/2024       Reactions   Tizanidine Nausea Only   Methocarbamol Other (See Comments)   Numbness, and change of taste    Vicodin [hydrocodone -acetaminophen ] Itching   Can take with benadryl          Medication List        Accurate as of April 04, 2024  2:27 PM. If you have any questions, ask your nurse or doctor.          amLODipine -benazepril  10-40 MG capsule Commonly known as: LOTREL Take 1 capsule by mouth daily.   aspirin EC 81 MG tablet Take 81 mg by mouth daily.   atorvastatin  40 MG tablet Commonly known as: LIPITOR Take 1 tablet (40 mg total) by mouth daily.   betamethasone  dipropionate 0.05 % cream Apply topically 2 (two) times daily. What changed:  how much to take when to take this   cetirizine  10 MG tablet Commonly known as: ZYRTEC  Take 1 tablet (10 mg total) by mouth daily. Take one tablet daily What changed: additional instructions   ciprofloxacin  500 MG tablet Commonly known as: Cipro  Take 1 tablet (500 mg total) by mouth 2 (two) times daily for 7 days.   cyclobenzaprine  10 MG tablet Commonly known as: FLEXERIL  Take 1 tablet (10 mg total) by mouth at bedtime. What changed: how much to take   dapagliflozin  propanediol  10 MG Tabs tablet Commonly known as: Farxiga  TAKE 1 TABLET BY MOUTH EVERY DAY BEFORE BREAKFAST   finasteride  5 MG tablet Commonly known as: PROSCAR  TAKE 1 TABLET (5 MG TOTAL) BY MOUTH DAILY.   fluconazole  100 MG tablet Commonly known as: DIFLUCAN  Take 1 tablet (100 mg total) by mouth daily. X 7 days   fluticasone  50 MCG/ACT nasal spray Commonly known as: FLONASE  USE TWO SPRAYS IN EACH NOSTRIL EVERY DAY   hydrochlorothiazide  25 MG tablet Commonly known as: HYDRODIURIL  Take 1 tablet (25 mg total) by mouth daily.   meloxicam  15 MG tablet Commonly known as: MOBIC  Take 15 mg by mouth daily.   metoprolol   succinate 50 MG 24 hr tablet Commonly known as: TOPROL -XL Take 1 tablet (50 mg total) by mouth daily. TAKE WITH OR IMMEDIATELY FOLLOWING A MEAL.   montelukast  10 MG tablet Commonly known as: SINGULAIR  Take 1 tablet (10 mg total) by mouth at bedtime.   multivitamin with minerals Tabs tablet Take 1 tablet by mouth daily.   NON FORMULARY Pt uses a cpap nightly   omeprazole 20 MG capsule Commonly known as: PRILOSEC Take 20 mg by mouth daily.   Semaglutide  (2 MG/DOSE) 8 MG/3ML Sopn Inject 2 mg as directed once a week.   sertraline  50 MG tablet Commonly known as: ZOLOFT  TAKE 1 TABLET BY MOUTH EVERY DAY   tamsulosin  0.4 MG Caps capsule Commonly known as: FLOMAX  TAKE 2 CAPSULES BY MOUTH EVERY DAY        Allergies:  Allergies  Allergen Reactions   Tizanidine Nausea Only   Methocarbamol Other (See Comments)    Numbness, and change of taste    Vicodin [Hydrocodone -Acetaminophen ] Itching    Can take with benadryl      Family History: Family History  Problem Relation Age of Onset   Prostate cancer Father    Heart disease Father    Diabetes Father    Alzheimer's disease Father    Anemia Mother    Osteoporosis Mother    Healthy Sister    Diabetes Brother    Hypertension Brother    Anemia Daughter    Diabetes Brother    Kidney disease Neg Hx    Bladder  Cancer Neg Hx    Colon cancer Neg Hx     Social History:  reports that he has never smoked. He has never been exposed to tobacco smoke. He has never used smokeless tobacco. He reports that he does not drink alcohol and does not use drugs.   Physical Exam: BP 128/79   Pulse 99   Constitutional:  Well nourished. Alert and oriented, No acute distress. HEENT: [place] AT, moist mucus membranes.  Trachea midline, no masses. Cardiovascular: No clubbing, cyanosis, or edema. Respiratory: Normal respiratory effort, no increased work of breathing. GU: Unchanged from previous pictures  Neurologic: Grossly intact, no focal deficits, moving all 4 extremities. Psychiatric: Normal mood and affect.     Laboratory Data: N/A    Pertinent imaging:  N/A  Assessment & Plan:    1. Prostate cancer -on active surveillance -will recheck PSA on return if asymptomatic -will recheck PSA when he recovers from procedure  2. Penile adhesions - I prescribed Diflucan  100 mg daily for 7 days - Cipro  500 mg twice daily for 7 days - We will get his procedure rescheduled to have his adhesions addressed  Return for will need to reschedule his  procedure .  Briant Camper   [facility] [address], [address] [place], [place] [address] [phone]

## 2024-04-03 NOTE — Progress Notes (Unsigned)
 04/04/24 2:27 PM   [name] [birth year] [id number]  Referring provider:  Carlean Charter, DO [address] [place],  [place] [address]  Urological history: T1c low risk prostate cancer  - PSA (07/2023) 2.0 - Biopsy 07/2018; abnormal PSA velocity (2.0 uncorrected); 48 g  - 4/12 cores; Gleason 3+3 adenocarcinoma - Elected active surveillance - MRI 08/2020; 31 g prostate; no high-grade lesions noted - Confirmatory biopsy 12/09/2020; 31 g volume -1/12 cores; Gleason 3+3 adenocarcinoma 5%  2.  BPH with LUTS - Combination therapy tamsulosin /finasteride   3.  History stone disease - Prior SWL - CT 06/2017 with 3 mm right upper pole calculus - CT 03/2022 - possible 2-3 mm left proximal ureteral stone   Chief Complaint  Patient presents with   Penis Pain    HPI: NEAMIAH Soto is a 63 y.o.male who presents today for penile pain getting worse.    Previous records reviewed.     He was scheduled for takedown of his penile adhesions on April 01, 2024, but unfortunately he was not instructed to discontinue his Farxiga  and his semaglutide .  His case needed to be postponed.  He states he feels that it is infected now.  He has been noticing a whitish discharge that is pretty copious especially in the a.m.  Is also having some burning through the shaft as well.  Patient denies any modifying or aggravating factors.  Patient denies any recent UTI's, gross hematuria, dysuria or suprapubic/flank pain.  Patient denies any fevers, chills, nausea or vomiting.    PMH: Past Medical History:  Diagnosis Date   ADHD (attention deficit hyperactivity disorder)    Anemia    Anxiety    Arthritis    DM (diabetes mellitus), type 2 (HCC)    Dyspnea    Enlarged prostate    hx of   Environmental and seasonal allergies    Frequency of urination    GERD (gastroesophageal reflux disease)    History of kidney stones    HOH (hard of hearing)    wears bilateral hearing aids   Hyperlipidemia     Hypertension    Inguinal hernia    Irregular heart beat    Pt feels this every once in a while; PCP aware but stated it was OK   Kidney stone on right side 01/15/2017   Leg pain, right    Take Lyrica  and Naproxen   Lumbar stenosis with neurogenic claudication    Morbid obesity (HCC)    MRSA (methicillin resistant staph aureus) culture positive    Neck pain of over 3 months duration    OSA on CPAP    Penile adhesions    Prostate cancer (HCC)    Thalassemia    Tick fever    Umbilical hernia     Surgical History: Past Surgical History:  Procedure Laterality Date   ANTERIOR CERVICAL DECOMP/DISCECTOMY FUSION N/A 11/13/2014   Procedure: Cervical three-four, Cervical six-seven anterior cervical decompression with fusion interbody prosthesis plating and bonegraft;  Surgeon: Garry Kansas, MD;  Location: [facility] OR;  Service: Neurosurgery;  Laterality: N/A;  Cervical three-four, Cervical six-seven anterior cervical decompression with fusion interbody prosthesis plating and bonegraft   BACK SURGERY     Lumbar X 2   CALCANEAL OSTEOTOMY Right 01/11/2017   Procedure: CALCANEAL OSTEOTOMY;  Surgeon: Marlynn Singer, MD;  Location: [facility];  Service: Orthopedics;  Laterality: Right;   CIRCUMCISION N/A 12/13/2017   Procedure: PENILE BLOCK, LYSIS OF PENILE ADHESIONS;  Surgeon: Ace Holder,  Odilia Bennett, MD;  Location: [facility];  Service: Urology;  Laterality: N/A;   COLONOSCOPY     COLONOSCOPY WITH PROPOFOL  N/A 09/15/2021   Procedure: COLONOSCOPY WITH PROPOFOL ;  Surgeon: Toledo, Alphonsus Jeans, MD;  Location: [facility];  Service: Gastroenterology;  Laterality: N/A;  DM   CYSTOSCOPY N/A 12/13/2017   Procedure: CYSTOSCOPY;  Surgeon: Dustin Gimenez, MD;  Location: [facility];  Service: Urology;  Laterality: N/A;   DIRECT LARYNGOSCOPY N/A 11/24/2020   Procedure: MICRO DIRECT LARYNGOSCOPY W/BIOPSY;  Surgeon: Lesly Raspberry, MD;  Location: [facility];  Service: ENT;  Laterality: N/A;   HERNIA REPAIR Left    groin   LEG  SURGERY Right    Had surgery on right calf X 7   LITHOTRIPSY     LUMBAR WOUND DEBRIDEMENT N/A 11/28/2019   Procedure: LUMBAR WOUND DEBRIDEMENT;  Surgeon: Garry Kansas, MD;  Location: [facility] OR;  Service: Neurosurgery;  Laterality: N/A;  LUMBAR WOUND DEBRIDEMENT   QUADRICEPS TENDON REPAIR Left 06/22/2015   Procedure: REPAIR QUADRICEP TENDON;  Surgeon: Marlynn Singer, MD;  Location: [facility];  Service: Orthopedics;  Laterality: Left;   QUADRICEPS TENDON REPAIR Left 08/12/2015   Procedure: REPAIR QUADRICEP TENDON;  Surgeon: Marlynn Singer, MD;  Location: [facility];  Service: Orthopedics;  Laterality: Left;   ROTATOR CUFF REPAIR Left    TONSILLECTOMY     VASECTOMY     with hydrocele repaired    Home Medications:  Allergies as of 04/04/2024       Reactions   Tizanidine Nausea Only   Methocarbamol Other (See Comments)   Numbness, and change of taste    Vicodin [hydrocodone -acetaminophen ] Itching   Can take with benadryl          Medication List        Accurate as of April 04, 2024  2:27 PM. If you have any questions, ask your nurse or doctor.          amLODipine -benazepril  10-40 MG capsule Commonly known as: LOTREL Take 1 capsule by mouth daily.   aspirin EC 81 MG tablet Take 81 mg by mouth daily.   atorvastatin  40 MG tablet Commonly known as: LIPITOR Take 1 tablet (40 mg total) by mouth daily.   betamethasone  dipropionate 0.05 % cream Apply topically 2 (two) times daily. What changed:  how much to take when to take this   cetirizine  10 MG tablet Commonly known as: ZYRTEC  Take 1 tablet (10 mg total) by mouth daily. Take one tablet daily What changed: additional instructions   ciprofloxacin  500 MG tablet Commonly known as: Cipro  Take 1 tablet (500 mg total) by mouth 2 (two) times daily for 7 days.   cyclobenzaprine  10 MG tablet Commonly known as: FLEXERIL  Take 1 tablet (10 mg total) by mouth at bedtime. What changed: how much to take   dapagliflozin  propanediol  10 MG Tabs tablet Commonly known as: Farxiga  TAKE 1 TABLET BY MOUTH EVERY DAY BEFORE BREAKFAST   finasteride  5 MG tablet Commonly known as: PROSCAR  TAKE 1 TABLET (5 MG TOTAL) BY MOUTH DAILY.   fluconazole  100 MG tablet Commonly known as: DIFLUCAN  Take 1 tablet (100 mg total) by mouth daily. X 7 days   fluticasone  50 MCG/ACT nasal spray Commonly known as: FLONASE  USE TWO SPRAYS IN EACH NOSTRIL EVERY DAY   hydrochlorothiazide  25 MG tablet Commonly known as: HYDRODIURIL  Take 1 tablet (25 mg total) by mouth daily.   meloxicam  15 MG tablet Commonly known as: MOBIC  Take 15 mg by mouth daily.   metoprolol   succinate 50 MG 24 hr tablet Commonly known as: TOPROL -XL Take 1 tablet (50 mg total) by mouth daily. TAKE WITH OR IMMEDIATELY FOLLOWING A MEAL.   montelukast  10 MG tablet Commonly known as: SINGULAIR  Take 1 tablet (10 mg total) by mouth at bedtime.   multivitamin with minerals Tabs tablet Take 1 tablet by mouth daily.   NON FORMULARY Pt uses a cpap nightly   omeprazole 20 MG capsule Commonly known as: PRILOSEC Take 20 mg by mouth daily.   Semaglutide  (2 MG/DOSE) 8 MG/3ML Sopn Inject 2 mg as directed once a week.   sertraline  50 MG tablet Commonly known as: ZOLOFT  TAKE 1 TABLET BY MOUTH EVERY DAY   tamsulosin  0.4 MG Caps capsule Commonly known as: FLOMAX  TAKE 2 CAPSULES BY MOUTH EVERY DAY        Allergies:  Allergies  Allergen Reactions   Tizanidine Nausea Only   Methocarbamol Other (See Comments)    Numbness, and change of taste    Vicodin [Hydrocodone -Acetaminophen ] Itching    Can take with benadryl      Family History: Family History  Problem Relation Age of Onset   Prostate cancer Father    Heart disease Father    Diabetes Father    Alzheimer's disease Father    Anemia Mother    Osteoporosis Mother    Healthy Sister    Diabetes Brother    Hypertension Brother    Anemia Daughter    Diabetes Brother    Kidney disease Neg Hx    Bladder  Cancer Neg Hx    Colon cancer Neg Hx     Social History:  reports that he has never smoked. He has never been exposed to tobacco smoke. He has never used smokeless tobacco. He reports that he does not drink alcohol and does not use drugs.   Physical Exam: BP 128/79   Pulse 99   Constitutional:  Well nourished. Alert and oriented, No acute distress. HEENT: [place] AT, moist mucus membranes.  Trachea midline, no masses. Cardiovascular: No clubbing, cyanosis, or edema. Respiratory: Normal respiratory effort, no increased work of breathing. GU: Unchanged from previous pictures  Neurologic: Grossly intact, no focal deficits, moving all 4 extremities. Psychiatric: Normal mood and affect.     Laboratory Data: N/A    Pertinent imaging:  N/A  Assessment & Plan:    1. Prostate cancer -on active surveillance -will recheck PSA on return if asymptomatic -will recheck PSA when he recovers from procedure  2. Penile adhesions - I prescribed Diflucan  100 mg daily for 7 days - Cipro  500 mg twice daily for 7 days - We will get his procedure rescheduled to have his adhesions addressed  Return for will need to reschedule his  procedure .  Briant Camper   [facility] [address], [address] [place], [place] [address] [phone]

## 2024-04-04 ENCOUNTER — Encounter: Payer: Self-pay | Admitting: Urology

## 2024-04-04 ENCOUNTER — Ambulatory Visit: Admitting: Urology

## 2024-04-04 ENCOUNTER — Telehealth: Payer: Self-pay

## 2024-04-04 VITALS — BP 128/79 | HR 99

## 2024-04-04 DIAGNOSIS — N419 Inflammatory disease of prostate, unspecified: Secondary | ICD-10-CM | POA: Diagnosis not present

## 2024-04-04 DIAGNOSIS — N478 Other disorders of prepuce: Secondary | ICD-10-CM

## 2024-04-04 DIAGNOSIS — C61 Malignant neoplasm of prostate: Secondary | ICD-10-CM | POA: Diagnosis not present

## 2024-04-04 MED ORDER — FLUCONAZOLE 100 MG PO TABS: 100.0000 mg | ORAL_TABLET | Freq: Every day | ORAL | 0 refills | Status: AC

## 2024-04-04 MED ORDER — CIPROFLOXACIN HCL 500 MG PO TABS
500.0000 mg | ORAL_TABLET | Freq: Two times a day (BID) | ORAL | 0 refills | Status: AC
Start: 2024-04-04 — End: 2024-04-11

## 2024-04-04 NOTE — Addendum Note (Signed)
 Addended by: Sheryn Doom A on: 04/04/2024 02:49 PM   Modules accepted: Orders

## 2024-04-04 NOTE — Telephone Encounter (Signed)
 Surgery Rescheduled to 5/6 with Dr. Cherylene Corrente, MD.

## 2024-04-05 ENCOUNTER — Encounter: Payer: Self-pay | Admitting: Family Medicine

## 2024-04-05 ENCOUNTER — Ambulatory Visit: Payer: Self-pay | Admitting: Family Medicine

## 2024-04-05 VITALS — BP 126/77 | HR 91 | Ht 66.0 in | Wt 240.0 lb

## 2024-04-05 DIAGNOSIS — E118 Type 2 diabetes mellitus with unspecified complications: Secondary | ICD-10-CM

## 2024-04-05 DIAGNOSIS — C61 Malignant neoplasm of prostate: Secondary | ICD-10-CM | POA: Diagnosis not present

## 2024-04-05 DIAGNOSIS — Z7984 Long term (current) use of oral hypoglycemic drugs: Secondary | ICD-10-CM

## 2024-04-05 DIAGNOSIS — Z7985 Long-term (current) use of injectable non-insulin antidiabetic drugs: Secondary | ICD-10-CM

## 2024-04-05 DIAGNOSIS — E785 Hyperlipidemia, unspecified: Secondary | ICD-10-CM

## 2024-04-05 DIAGNOSIS — E1159 Type 2 diabetes mellitus with other circulatory complications: Secondary | ICD-10-CM

## 2024-04-05 DIAGNOSIS — E1169 Type 2 diabetes mellitus with other specified complication: Secondary | ICD-10-CM | POA: Diagnosis not present

## 2024-04-05 DIAGNOSIS — I152 Hypertension secondary to endocrine disorders: Secondary | ICD-10-CM | POA: Diagnosis not present

## 2024-04-05 DIAGNOSIS — M1990 Unspecified osteoarthritis, unspecified site: Secondary | ICD-10-CM

## 2024-04-05 NOTE — Progress Notes (Signed)
 Established patient visit   Patient: Justin Soto   DOB: Aug 09, 1961   63 y.o. Male  MRN: [id number] Visit Date: 04/05/2024  Today's healthcare provider: Carlean Charter, DO   Chief Complaint  Patient presents with   Medical Management of Chronic Issues    Pt reports taking all medications as prescribed as well as just started two abx   Diabetes    No symptoms to report   Hyperlipidemia    No symptoms to report   Hypertension    No symptoms to report   Subjective    Diabetes Pertinent negatives for hypoglycemia include no headaches. Pertinent negatives for diabetes include no chest pain and no weakness.  Hyperlipidemia Pertinent negatives include no chest pain or shortness of breath.  Hypertension Pertinent negatives include no chest pain, headaches, palpitations or shortness of breath.   Justin Soto is a 63 year old male with diabetes who presents for a follow-up visit.  He is currently managing his diabetes with Ozempic  2 mg and Farxiga . His blood glucose levels at home have been stable, ranging from 100 to 120 mg/dL, with no recent readings over 120 mg/dL. He does not take metformin .  He has a history of a penile lesion, which has been problematic. A procedure with urology was postponed due to scheduling issues and a medication conflict. The infection is located on the side of the penis tip.  He experiences neck and upper back pain, attributed to arthritis. He has received two injections in the past, which provided temporary relief. He stopped taking gabapentin  after long-term use and reports the pain as an ache that worsens with activity, particularly near the shoulder blade. He occasionally takes cyclobenzaprine  for muscle relaxation, prescribed by a neurosurgeon.  He has a small abdominal hernia, more noticeable since weight loss. It is tender but not causing significant discomfort.  He experiences constipation with hard stools, having bowel movements two to three  times a day. Colace has provided slight relief. He ensures regular bowel movements but notes the stools are hard.  No new vision changes, though he requires glasses for reading and driving. A recent complete eye exam was satisfactory.  No chest pain, shortness of breath, or significant fatigue, though he feels tired after physical activity such as yard work. He experiences some numbness and tingling in his feet, which has not changed recently.     Medications: Outpatient Medications Prior to Visit  Medication Sig   amLODipine -benazepril  (LOTREL) 10-40 MG capsule Take 1 capsule by mouth daily.   aspirin EC 81 MG tablet Take 81 mg by mouth daily.    atorvastatin  (LIPITOR) 40 MG tablet Take 1 tablet (40 mg total) by mouth daily.   betamethasone  dipropionate 0.05 % cream Apply topically 2 (two) times daily. (Patient taking differently: Apply 1 Application topically at bedtime.)   cetirizine  (ZYRTEC ) 10 MG tablet Take 1 tablet (10 mg total) by mouth daily. Take one tablet daily (Patient taking differently: Take 10 mg by mouth daily. May take a second 10 mg dose at night as needed for allergies)   ciprofloxacin  (CIPRO ) 500 MG tablet Take 1 tablet (500 mg total) by mouth 2 (two) times daily for 7 days.   cyclobenzaprine  (FLEXERIL ) 10 MG tablet Take 1 tablet (10 mg total) by mouth at bedtime. (Patient taking differently: Take 10-20 mg by mouth at bedtime.)   dapagliflozin  propanediol (FARXIGA ) 10 MG TABS tablet TAKE 1 TABLET BY MOUTH EVERY DAY BEFORE BREAKFAST  finasteride  (PROSCAR ) 5 MG tablet TAKE 1 TABLET (5 MG TOTAL) BY MOUTH DAILY.   fluconazole  (DIFLUCAN ) 100 MG tablet Take 1 tablet (100 mg total) by mouth daily. X 7 days   fluticasone  (FLONASE ) 50 MCG/ACT nasal spray USE TWO SPRAYS IN EACH NOSTRIL EVERY DAY   hydrochlorothiazide  (HYDRODIURIL ) 25 MG tablet Take 1 tablet (25 mg total) by mouth daily.   meloxicam  (MOBIC ) 15 MG tablet Take 15 mg by mouth daily.   metoprolol  succinate (TOPROL -XL)  50 MG 24 hr tablet Take 1 tablet (50 mg total) by mouth daily. TAKE WITH OR IMMEDIATELY FOLLOWING A MEAL.   montelukast  (SINGULAIR ) 10 MG tablet Take 1 tablet (10 mg total) by mouth at bedtime.   Multiple Vitamin (MULTIVITAMIN WITH MINERALS) TABS tablet Take 1 tablet by mouth daily.   NON FORMULARY Pt uses a cpap nightly   omeprazole (PRILOSEC) 20 MG capsule Take 20 mg by mouth daily.    Semaglutide , 2 MG/DOSE, 8 MG/3ML SOPN Inject 2 mg as directed once a week.   sertraline  (ZOLOFT ) 50 MG tablet TAKE 1 TABLET BY MOUTH EVERY DAY   tamsulosin  (FLOMAX ) 0.4 MG CAPS capsule TAKE 2 CAPSULES BY MOUTH EVERY DAY   No facility-administered medications prior to visit.    Review of Systems  Respiratory: Negative.  Negative for cough, shortness of breath and wheezing.   Cardiovascular:  Negative for chest pain, palpitations and leg swelling.  Gastrointestinal:  Positive for abdominal pain (minimal, intermittent at hernia) and constipation. Negative for diarrhea, nausea and vomiting.  Neurological:  Negative for weakness and headaches.        Objective    BP 126/77 (BP Location: Right Arm, Patient Position: Sitting, Cuff Size: Large)   Pulse 91   Ht 5\' 6"  (1.676 m)   Wt 240 lb (108.9 kg)   SpO2 97%   BMI 38.74 kg/m     Physical Exam Vitals reviewed.  Constitutional:      General: He is not in acute distress.    Appearance: Normal appearance. He is not diaphoretic.  HENT:     Head: Normocephalic and atraumatic.  Eyes:     General: No scleral icterus.    Conjunctiva/sclera: Conjunctivae normal.  Cardiovascular:     Rate and Rhythm: Normal rate and regular rhythm.     Pulses: Normal pulses.     Heart sounds: Normal heart sounds. No murmur heard. Pulmonary:     Effort: Pulmonary effort is normal. No respiratory distress.     Breath sounds: Normal breath sounds. No wheezing or rhonchi.  Abdominal:     General: Bowel sounds are normal. There is no distension.     Palpations: Abdomen  is soft. There is no mass.     Tenderness: There is no abdominal tenderness.     Hernia: A hernia is present.  Skin:    General: Skin is warm and dry.  Neurological:     Mental Status: He is alert and oriented to person, place, and time. Mental status is at baseline.  Psychiatric:        Mood and Affect: Mood normal.        Behavior: Behavior normal.      No results found for any visits on 04/05/24.  Assessment & Plan    Type 2 diabetes mellitus with complication (HCC) -     Comprehensive metabolic panel with GFR -     Hemoglobin A1c -     Lipid panel  Hypertension associated with diabetes (HCC) -  Comprehensive metabolic panel with GFR  Prostate cancer [facility]) Assessment & Plan: Continues surveillance with urology; defer the their management.    Hyperlipidemia associated with type 2 diabetes mellitus (HCC) -     Lipid panel  Arthritis    Type 2 diabetes mellitus Well-controlled with Ozempic  and Farxiga . Home blood glucose levels consistently range from 100-120 mg/dL. - Continue current dose of Ozempic  2 mg weekly and Farxiga  10 mg daily. - Schedule follow-up in 3 months for annual exam and reassessment of diabetes management.  Hypertension Chronic, stable.   - Continue hydrochlorothiazide  25 mg daily and amlodipine -benazepril  10-40 mg daily.  Hyperlipidemia - Will recheck lipid panel today - Continue atorvastatin  40 mg daily  Arthritis of the neck Chronic arthritis with intermittent aches during physical activity. Previous injections provided temporary relief. X-rays confirm arthritis without other significant findings (though record does show bulge of a cervical disc). - Consider neurosurgery re-evaluation if symptoms worsen. - Encourage continuation of current management strategies, including activity modification.  Constipation Chronic constipation with hard stools 2-3 times daily. Symptoms are not severe but could lead to complications like  hemorrhoids. - Increase dietary fiber intake and ensure adequate hydration. - Consider daily Colace to soften stools.  Hernia Small abdominal hernia, more noticeable post-weight loss, not causing significant discomfort. Discussed potential need for emergency intervention if painful and irreducible. - Monitor for symptom changes. - Seek immediate care if hernia becomes painful and irreducible.  General Health Maintenance Declined shingles vaccine due to previous adverse experiences with vaccines. - Reassess interest in shingles vaccine in one year.    Return in about 6 months (around 10/05/2024) for CPE, DM.      I discussed the assessment and treatment plan with the patient  The patient was provided an opportunity to ask questions and all were answered. The patient agreed with the plan and demonstrated an understanding of the instructions.   The patient was advised to call back or seek an in-person evaluation if the symptoms worsen or if the condition fails to improve as anticipated.    Carlean Charter, DO  [facility] [phone] (phone) [phone] (fax)  [facility]

## 2024-04-05 NOTE — Assessment & Plan Note (Signed)
 Continues surveillance with urology; defer the their management.

## 2024-04-06 LAB — COMPREHENSIVE METABOLIC PANEL WITH GFR
ALT: 39 IU/L (ref 0–44)
AST: 40 IU/L (ref 0–40)
Albumin: 4.4 g/dL (ref 3.9–4.9)
Alkaline Phosphatase: 74 IU/L (ref 44–121)
BUN/Creatinine Ratio: 14 (ref 10–24)
BUN: 13 mg/dL (ref 8–27)
Bilirubin Total: 0.9 mg/dL (ref 0.0–1.2)
CO2: 23 mmol/L (ref 20–29)
Calcium: 9.8 mg/dL (ref 8.6–10.2)
Chloride: 98 mmol/L (ref 96–106)
Creatinine, Ser: 0.93 mg/dL (ref 0.76–1.27)
Globulin, Total: 3.3 g/dL (ref 1.5–4.5)
Glucose: 102 mg/dL — ABNORMAL HIGH (ref 70–99)
Potassium: 3.5 mmol/L (ref 3.5–5.2)
Sodium: 138 mmol/L (ref 134–144)
Total Protein: 7.7 g/dL (ref 6.0–8.5)
eGFR: 92 mL/min/{1.73_m2} (ref >59)

## 2024-04-06 LAB — LIPID PANEL
Chol/HDL Ratio: 3.3 ratio (ref 0.0–5.0)
Cholesterol, Total: 97 mg/dL — ABNORMAL LOW (ref 100–199)
HDL: 29 mg/dL — ABNORMAL LOW (ref >39)
LDL Chol Calc (NIH): 42 mg/dL (ref 0–99)
Triglycerides: 148 mg/dL (ref 0–149)
VLDL Cholesterol Cal: 26 mg/dL (ref 5–40)

## 2024-04-06 LAB — HEMOGLOBIN A1C
Est. average glucose Bld gHb Est-mCnc: 117 mg/dL
Hgb A1c MFr Bld: 5.7 % — ABNORMAL HIGH (ref 4.8–5.6)

## 2024-04-10 ENCOUNTER — Other Ambulatory Visit: Payer: Self-pay

## 2024-04-10 ENCOUNTER — Encounter
Admission: RE | Admit: 2024-04-10 | Discharge: 2024-04-10 | Disposition: A | Discharge status: Home / Self Care | Source: Ambulatory Visit | Attending: Urology | Admitting: Urology

## 2024-04-10 DIAGNOSIS — I2510 Atherosclerotic heart disease of native coronary artery without angina pectoris: Secondary | ICD-10-CM

## 2024-04-10 DIAGNOSIS — Z01812 Encounter for preprocedural laboratory examination: Secondary | ICD-10-CM

## 2024-04-10 DIAGNOSIS — Z01810 Encounter for preprocedural cardiovascular examination: Secondary | ICD-10-CM

## 2024-04-10 DIAGNOSIS — D509 Iron deficiency anemia, unspecified: Secondary | ICD-10-CM

## 2024-04-10 DIAGNOSIS — Z01818 Encounter for other preprocedural examination: Secondary | ICD-10-CM

## 2024-04-10 DIAGNOSIS — E1159 Type 2 diabetes mellitus with other circulatory complications: Secondary | ICD-10-CM

## 2024-04-10 HISTORY — DX: Obesity, unspecified: E66.9

## 2024-04-10 HISTORY — DX: Atherosclerotic heart disease of native coronary artery without angina pectoris: I25.10

## 2024-04-10 HISTORY — DX: Iron deficiency anemia, unspecified: D50.9

## 2024-04-10 HISTORY — DX: Family history of other specified conditions: Z84.89

## 2024-04-10 HISTORY — DX: Personal history of other diseases of 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 organs: Z87.438

## 2024-04-10 NOTE — Patient Instructions (Signed)
 Your procedure is scheduled on:04-16-24 Tuesday Report to the Registration Desk on the 1st floor of the [facility].Then proceed to the 2nd floor Surgery Desk To find out your arrival time, please call [phone] between 1PM - 3PM on:04-15-24 Monday If your arrival time is 6:00 am, do not arrive before that time as the [facility] entrance doors do not open until 6:00 am.  REMEMBER: Instructions that are not followed completely may result in serious medical risk, up to and including death; or upon the discretion of your surgeon and anesthesiologist your surgery may need to be rescheduled.  Do not eat food OR drink any liquids after midnight the night before surgery.  No gum chewing or hard candies.  One week prior to surgery:Stop NOW (04-10-24) Stop Anti-inflammatories (NSAIDS) such as meloxicam  (MOBIC ), Advil , Aleve, Ibuprofen , Motrin , Naproxen, Naprosyn and Aspirin based products such as Excedrin, Goody's Powder, BC Powder. Stop ANY OVER THE COUNTER supplements until after surgery (Multivitamin)  You may however, continue to take Tylenol  if needed for pain up until the day of surgery.  Stop Semaglutide  (Ozempic ) 7 days prior to surgery-Do NOT take again until AFTER surgery  Stop dapagliflozin  propanediol (FARXIGA ) 3 days prior to surgery-Last dose will be on 04-12-24 Friday  Continue taking all of your other prescription medications up until the day of surgery.  ON THE DAY OF SURGERY ONLY TAKE THESE MEDICATIONS WITH SIPS OF WATER: -atorvastatin  (LIPITOR)  -finasteride  (PROSCAR )  -metoprolol  succinate (TOPROL -XL)  -omeprazole (PRILOSEC)  -sertraline  (ZOLOFT )  -tamsulosin  (FLOMAX )   Last dose of 81 mg Aspirin was on 04-08-24 Monday  No Alcohol for 24 hours before or after surgery.  No Smoking including e-cigarettes for 24 hours before surgery.  No chewable tobacco products for at least 6 hours before surgery.  No nicotine patches on the day of surgery.  Do not use any  "recreational" drugs for at least a week (preferably 2 weeks) before your surgery.  Please be advised that the combination of cocaine and anesthesia may have negative outcomes, up to and including death. If you test positive for cocaine, your surgery will be cancelled.  On the morning of surgery brush your teeth with toothpaste and water, you may rinse your mouth with mouthwash if you wish. Do not swallow any toothpaste or mouthwash.  Do not wear jewelry, make-up, hairpins, clips or nail polish.  For welded (permanent) jewelry: bracelets, anklets, waist bands, etc.  Please have this removed prior to surgery.  If it is not removed, there is a chance that hospital personnel will need to cut it off on the day of surgery.  Do not wear lotions, powders, or perfumes.   Do not shave body hair from the neck down 48 hours before surgery.  Contact lenses, hearing aids and dentures may not be worn into surgery.  Do not bring valuables to the hospital. [facility] is not responsible for any missing/lost belongings or valuables.   Bring your C-PAP to the hospital  Notify your doctor if there is any change in your medical condition (cold, fever, infection).  Wear comfortable clothing (specific to your surgery type) to the hospital.  After surgery, you can help prevent lung complications by doing breathing exercises.  Take deep breaths and cough every 1-2 hours. Your doctor may order a device called an Incentive Spirometer to help you take deep breaths. When coughing or sneezing, hold a pillow firmly against your incision with both hands. This is called "splinting." Doing this helps protect your  incision. It also decreases belly discomfort.  If you are being admitted to the hospital overnight, leave your suitcase in the car. After surgery it may be brought to your room.  In case of increased patient census, it may be necessary for you, the patient, to continue your postoperative care in the Same Day  Surgery department.  If you are being discharged the day of surgery, you will not be allowed to drive home. You will need a responsible individual to drive you home and stay with you for 24 hours after surgery.   If you are taking public transportation, you will need to have a responsible individual with you.  Please call the Pre-admissions Testing Dept. at [phone] if you have any questions about these instructions.  Surgery Visitation Policy:  Patients having surgery or a procedure may have two visitors.  Children under the age of 82 must have an adult with them who is not the patient.

## 2024-04-11 ENCOUNTER — Encounter
Admission: RE | Admit: 2024-04-11 | Discharge: 2024-04-11 | Disposition: A | Discharge status: Home / Self Care | Source: Ambulatory Visit | Attending: Urology | Admitting: Urology

## 2024-04-11 DIAGNOSIS — Z01818 Encounter for other preprocedural examination: Secondary | ICD-10-CM | POA: Diagnosis not present

## 2024-04-11 DIAGNOSIS — I152 Hypertension secondary to endocrine disorders: Secondary | ICD-10-CM | POA: Diagnosis not present

## 2024-04-11 DIAGNOSIS — I2510 Atherosclerotic heart disease of native coronary artery without angina pectoris: Secondary | ICD-10-CM | POA: Insufficient documentation

## 2024-04-11 DIAGNOSIS — Z01812 Encounter for preprocedural laboratory examination: Secondary | ICD-10-CM

## 2024-04-11 DIAGNOSIS — D509 Iron deficiency anemia, unspecified: Secondary | ICD-10-CM | POA: Insufficient documentation

## 2024-04-11 DIAGNOSIS — Z01810 Encounter for preprocedural cardiovascular examination: Secondary | ICD-10-CM | POA: Diagnosis not present

## 2024-04-11 DIAGNOSIS — E1159 Type 2 diabetes mellitus with other circulatory complications: Secondary | ICD-10-CM | POA: Diagnosis not present

## 2024-04-11 LAB — CBC
HCT: 35.1 % — ABNORMAL LOW (ref 39.0–52.0)
Hemoglobin: 11.3 g/dL — ABNORMAL LOW (ref 13.0–17.0)
MCH: 20.3 pg — ABNORMAL LOW (ref 26.0–34.0)
MCHC: 32.2 g/dL (ref 30.0–36.0)
MCV: 63.1 fL — ABNORMAL LOW (ref 80.0–100.0)
Platelets: 172 10*3/uL (ref 150–400)
RBC: 5.56 MIL/uL (ref 4.22–5.81)
RDW: 17.4 % — ABNORMAL HIGH (ref 11.5–15.5)
WBC: 4.5 10*3/uL (ref 4.0–10.5)
nRBC: 0 % (ref 0.0–0.2)

## 2024-04-12 ENCOUNTER — Encounter: Payer: Self-pay | Admitting: Urology

## 2024-04-12 NOTE — Progress Notes (Signed)
 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 Clinical nursing staff has updated patient's PMH/PSHx, current medication list, and drug allergies/intolerances to ensure comprehensive history available to assist in medical decision making as it pertains to the aforementioned surgical procedure and anticipated anesthetic course. Extensive review of available clinical information personally performed. [place] PMH and PSHx updated with any diagnoses/procedures that  may have been inadvertently omitted during his intake with the pre-admission testing department's nursing staff.  Clinical Discussion:  Justin Soto is a 63 y.o. male who is submitted for pre-surgical anesthesia review and clearance prior to him undergoing the above procedure. Patient has never been a smoker in the past. Pertinent PMH includes: CAD, palpitations, HTN, HLD, T2DM, dyspnea, OSAH (requires nocturnal PAP therapy), GERD (on daily PPI), IDA, thalassemia, BPH, penile adhesions, nephrolithiasis, OA, RA, cervical DDD (s/p ACDF C3-C7), lumbar stenosis with neurogenic claudication (s/p laminectomy and fusion L3-L5 and laminectomy L5-S1).   Patient is followed by cardiology Bob Burn, MD). He was last seen in the cardiology clinic on 01/30/2024; notes reviewed. At the time of his clinic visit, patient doing well overall from a cardiovascular  perspective. Patient denied any chest pain, shortness of breath, PND, orthopnea, palpitations, significant peripheral edema, weakness, fatigue, vertiginous symptoms, or presyncope/syncope. Patient with a past medical history significant for cardiovascular diagnoses. Documented physical exam was grossly benign, providing no evidence of acute exacerbation and/or decompensation of the patient's known cardiovascular conditions.  Most recent myocardial perfusion imaging study was performed on 11/30/2016 revealing a normal left ventricular systolic function with an EF of 62%.  There were no regional wall motion abnormalities.  There is no evidence of stress-induced myocardial ischemia or arrhythmia; no scintigraphic evidence of scar.  Study determined to be normal and low risk.  Stress echocardiogram was performed on 12/25/2018 revealing a normal left ventricular systolic function with an EF of >55%.  There were no regional wall motion abnormalities.  No significant valvular abnormalities were noted.  Patient stressed for a total of 5 minutes achieving a max heart rate of 141 bpm.  Maximum workload of 7 METS was achieved during stress.  Coronary CTA was performed on 06/27/2019 demonstrating moderate three-vessel coronary artery disease with 30-50% focal stenosis in the proximal RCA, mid LAD, and proximal OM1.  CT FFR analysis was performed (ranges: < 0.75 high likelihood of hemodynamically significant stenosis, 0.76-0.80 borderline, > 0.80 normal):  LM: normal FFR of greater than 0.8  LAD: normal FFR throughout the vessel. FFR = 0.84 at the distal most aspect.  LCx: normal FFR throughout the vessel. FFR = 0.9 at the distal aspect of the vessel  RCA: normal FFR throughout the vessel. FFR = 0.93 of the distal aspect.   Blood pressure reasonably controlled at 132/80 mmHg on currently prescribed CCB (amlodipine ), ACEi (benazepril ) diuretic (HCTZ), and beta-blocker (metoprolol  succinate) therapies.  Patient is on  atorvastatin  for his HLD diagnosis and ASCVD prevention.  At the time of patient's visit with cardiology, T2DM demonstrated adequate control with an A1c of 6.0%.  Of note, since patient was last seen by cardiology, A1c has been rechecked with further improvement down to 5.7% In the setting of known cardiovascular diagnoses and concurrent T2DM, patient is taking an SGLT2i (dapagliflozin ) for added cardiovascular and renovascular protection.  Patient does have an OSAH diagnosis and is reported to be compliant with prescribed nocturnal PAP therapy.  Patient making efforts to remain active.  He is still employed as a Engineer, structural.  He is able to complete all his ADLs/IADLs without cardiovascular limitation.  Per the [facility], patient is able to exceed 4 METS of physical activity without experiencing any significant degrees of angina/anginal equivalent symptoms. No changes were made to his medication regimen during his visit with cardiology.  Patient scheduled to follow-up with outpatient cardiology in 1 year or sooner if needed.  [name] is scheduled for an elective DESTRUCTION OF PENILE LESIONS, PENILE BIOPSY, CYSTOSCOPY on 04/16/2024 with Dr. Darlynn Elam, MD.  Given patient's past medical history significant for cardiovascular diagnoses, presurgical cardiac clearance was sought by the PAT team. Per cardiology, "this patient is optimized for surgery and may proceed with the planned procedural course with a MODERATE risk of significant perioperative cardiovascular complications".  In review of the patient's chart, it is noted that he is on daily oral antithrombotic therapy. He has been instructed on recommendations for holding his daily low-dose ASA for 3 days prior to his procedure with plans to restart as soon as postoperative bleeding risk felt to be minimized by his attending surgeon. The patient has been instructed that his last dose of ASA should be on 04/12/2024.  Patient denies previous perioperative  complications with anesthesia in the past.  Patient does have a (+) family history of PONV in a first-degree relative (mother). In review his EMR, it is noted that patient underwent a general anesthetic course at [facility] ([facility] in 10/2022 without documented complications.      04/05/2024    8:13 AM 04/04/2024    1:59 PM 03/15/2024   10:08 AM  Vitals with BMI  Height 5\' 6"   5\' 6"   Weight 240 lbs  241 lbs  BMI 38.76  38.92  Systolic 126 128 478  Diastolic 77 79 82  Pulse 91 99 97   Providers/Specialists:  NOTE: Primary physician provider listed below. Patient may have been seen by APP or partner within same practice.   PROVIDER ROLE / SPECIALTY LAST Adan Holms, MD Urology (Surgeon) 04/04/2024  Carlean Charter, DO Primary Care Provider 04/05/2024  Joetta Mustache, MD Cardiology 01/30/2024  Karole Pacer, MD Rheumatology 09/28/2023   Allergies:   Allergies  Allergen Reactions   Tizanidine Nausea Only   Methocarbamol Other (See Comments)    Numbness, and change of taste    Vicodin [Hydrocodone -Acetaminophen ] Itching    Can take with benadryl     Current Home Medications:   No current facility-administered medications for this encounter.    amLODipine -benazepril  (LOTREL) 10-40 MG capsule   aspirin EC 81 MG tablet   atorvastatin  (LIPITOR) 40 MG tablet   betamethasone  dipropionate 0.05 % cream   cetirizine  (ZYRTEC ) 10 MG tablet   cyclobenzaprine  (FLEXERIL ) 10 MG tablet   dapagliflozin  propanediol (FARXIGA ) 10 MG TABS tablet   finasteride  (PROSCAR ) 5 MG tablet   fluconazole  (DIFLUCAN ) 100 MG tablet   fluticasone  (FLONASE ) 50  MCG/ACT nasal spray   hydrochlorothiazide  (HYDRODIURIL ) 25 MG tablet   meloxicam  (MOBIC ) 15 MG tablet   metoprolol  succinate (TOPROL -XL) 50 MG 24 hr tablet   montelukast  (SINGULAIR ) 10 MG tablet   Multiple Vitamin (MULTIVITAMIN WITH MINERALS) TABS tablet   omeprazole (PRILOSEC) 20 MG capsule   Semaglutide , 2 MG/DOSE, 8 MG/3ML SOPN    sertraline  (ZOLOFT ) 50 MG tablet   tamsulosin  (FLOMAX ) 0.4 MG CAPS capsule   NON FORMULARY   History:   Past Medical History:  Diagnosis Date   ADHD (attention deficit hyperactivity disorder)    Anxiety    Arthritis    Coronary artery disease    DM (diabetes mellitus), type 2 (HCC)    Dyspnea    Enlarged prostate    Environmental and seasonal allergies    Family history of adverse reaction to anesthesia    a.) PONV in 1st degree relative (mother)   GERD (gastroesophageal reflux disease)    History of kidney stones    History of prostatitis    HOH (hard of hearing); wears bilateral hearing aids    Hyperlipidemia    Hypertension    IDA (iron deficiency anemia)    Inguinal hernia    Leg pain, right    Lumbar stenosis with neurogenic claudication    Morbid obesity (HCC)    MRSA (methicillin resistant staph aureus) culture positive    Neck pain of over 3 months duration    Obesity    OSA on CPAP    Palpitations    Penile adhesions    Prostate cancer (HCC)    Thalassemia    Tick fever    Umbilical hernia    Past Surgical History:  Procedure Laterality Date   ANTERIOR CERVICAL DECOMP/DISCECTOMY FUSION N/A 11/13/2014   Procedure: Cervical three-four, Cervical six-seven anterior cervical decompression with fusion interbody prosthesis plating and bonegraft;  Surgeon: Garry Kansas, MD;  Location: [facility] OR;  Service: Neurosurgery;  Laterality: N/A;  Cervical three-four, Cervical six-seven anterior cervical decompression with fusion interbody prosthesis plating and bonegraft   BACK SURGERY     Lumbar X 2   CALCANEAL OSTEOTOMY Right 01/11/2017   Procedure: CALCANEAL OSTEOTOMY;  Surgeon: Marlynn Singer, MD;  Location: [facility] ORS;  Service: Orthopedics;  Laterality: Right;   CIRCUMCISION N/A 12/13/2017   Procedure: PENILE BLOCK, LYSIS OF PENILE ADHESIONS;  Surgeon: Dustin Gimenez, MD;  Location: [facility] ORS;  Service: Urology;  Laterality: N/A;   COLONOSCOPY     COLONOSCOPY WITH PROPOFOL   N/A 09/15/2021   Procedure: COLONOSCOPY WITH PROPOFOL ;  Surgeon: Toledo, Alphonsus Jeans, MD;  Location: [facility]COPY;  Service: Gastroenterology;  Laterality: N/A;  DM   CYSTOSCOPY N/A 12/13/2017   Procedure: CYSTOSCOPY;  Surgeon: Dustin Gimenez, MD;  Location: [facility] ORS;  Service: Urology;  Laterality: N/A;   DIRECT LARYNGOSCOPY N/A 11/24/2020   Procedure: MICRO DIRECT LARYNGOSCOPY W/BIOPSY;  Surgeon: Lesly Raspberry, MD;  Location: [facility] ORS;  Service: ENT;  Laterality: N/A;   HERNIA REPAIR Left    groin   LEG SURGERY Right    Had surgery on right calf X 7   LITHOTRIPSY     LUMBAR WOUND DEBRIDEMENT N/A 11/28/2019   Procedure: LUMBAR WOUND DEBRIDEMENT;  Surgeon: Garry Kansas, MD;  [facility] OR;  Service: Neurosurgery;  Laterality: N/A;  LUMBAR WOUND DEBRIDEMENT   QUADRICEPS TENDON REPAIR Left 06/22/2015   Procedure: REPAIR QUADRICEP TENDON;  Surgeon: Marlynn Singer, MD;  Location: [facility] ORS;  Service: Orthopedics;  Laterality: Left;   QUADRICEPS TENDON REPAIR Left 08/12/2015  Procedure: REPAIR QUADRICEP TENDON;  Surgeon: Marlynn Singer, MD;  Location: [facility] ORS;  Service: Orthopedics;  Laterality: Left;   ROTATOR CUFF REPAIR Left    TONSILLECTOMY     VASECTOMY     with hydrocele repaired   Family History  Problem Relation Age of Onset   Prostate cancer Father    Heart disease Father    Diabetes Father    Alzheimer's disease Father    Anemia Mother    Osteoporosis Mother    Healthy Sister    Diabetes Brother    Hypertension Brother    Anemia Daughter    Diabetes Brother    Kidney disease Neg Hx    Bladder Cancer Neg Hx    Colon cancer Neg Hx    Social History   Tobacco Use   Smoking status: Never    Passive exposure: Never   Smokeless tobacco: Never  Substance Use Topics   Alcohol use: No   Pertinent Clinical Results:  LABS:  Lab Results  Component Value Date   WBC 4.5 04/11/2024   HGB 11.3 (L) 04/11/2024   HCT 35.1 (L) 04/11/2024   MCV 63.1 (L) 04/11/2024   PLT  172 04/11/2024   Lab Results  Component Value Date   NA 138 04/05/2024   CL 98 04/05/2024   K 3.5 04/05/2024   CO2 23 04/05/2024   BUN 13 04/05/2024   CREATININE 0.93 04/05/2024   EGFR 92 04/05/2024   CALCIUM  9.8 04/05/2024   ALBUMIN  4.4 04/05/2024   GLUCOSE 102 (H) 04/05/2024   Lab Results  Component Value Date   HGBA1C 5.7 (H) 04/05/2024    ECG: Date: 04/11/2024  Time ECG obtained: 0843 AM Rate: 89 bpm Rhythm: normal sinus Axis (leads I and aVF): normal Intervals: PR 192 ms. QRS 92 ms. QTc 438 ms. ST segment and T wave changes: Inferior T wave abnormalities Evidence of a possible, age undetermined, prior infarct:  No Comparison: Similar to previous tracing obtained on 01/31/2022   IMAGING / PROCEDURES: MR CERVICAL SPINE WO CONTRAST performed on 05/04/2023 Prior ACDF at C3-4 and C6-7 without significant residual spinal stenosis. Multifactorial degenerative changes at C2-3 and C7-T1 with resultant mild spinal stenosis, with moderate to severe right C3 and bilateral C8 foraminal narrowing as above. Changes are mildly progressed as compared to previous MRI from 08/06/2019. 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  MR LUMBAR SPINE W WO CONTRAST  performed on 05/03/2022 Degenerative changes of the lumbar spine, not significantly changed since prior MRI. Severe hypertrophic arthropathy at L5-S1 with severe bilateral neural foraminal narrowing. Moderate spinal canal stenosis at L1-2 and severe at L2-3.  CT HEART ANGIOGRAM INCLUDING 3D IMAGING performed on 06/27/2019 Moderate three-vessel coronary artery calcifications with approximately 30-50% focal stenosis in the proximal RCA, mid LAD, and proximal OM1. CAD-RADS 3.  Normal heart size and function. Mild circumferential left ventricular hypertrophy.  Tiny pulmonary nodules. Consider follow-up CT in 12 months if patient is high risk for lung malignancy.  CT-FFR analysis (ranges: < 0.75  high likelihood of hemodynamically significant stenosis, 0.76-0.80 borderline, > 0.80 normal): Left Main Artery: Normal FFR of greater than 0.8  Left Anterior Descending System: Normal FFR throughout the vessel. FFR equals 0.84 at the distal most aspect.  Left Circumflex System: Normal FFR throughout the vessel. FFR equals 0.9 at the distal aspect of the vessel  Right Coronary Artery system: Normal FFR throughout the vessel. FFR equals 0.93 of the distal aspect.  STRESS ECHOCARDIOGRAM performed on 12/25/2018 Stress duration 5 minutes and 0 seconds Maximum heart rate 141 bpm Maximum workload = 7.00 METS Normal left ventricular systolic function with an EF of >55% No regional wall motion abnormalities No significant valvular abnormalities Normal study  MYOCARDIAL PERFUSION IMAGING STUDY (LEXISCAN) performed on 11/30/2016 Normal left ventricular systolic function with a normal LVEF of 60 to % Normal myocardial thickening and wall motion Left ventricular cavity size normal SPECT images demonstrate homogenous tracer distribution throughout the myocardium TID ratio = 0.99 No evidence of stress-induced myocardial ischemia or arrhythmia Normal low risk study  Impression and Plan:  Justin Soto has been referred for pre-anesthesia review and clearance prior to him undergoing the planned anesthetic and procedural courses. Available labs, pertinent testing, and imaging results were personally reviewed by me in preparation for upcoming operative/procedural course. [facility] medical record has been updated following extensive record review and patient interview with PAT staff.   This patient has been appropriately cleared by cardiology with an overall MODERATE risk of patient experiencing significant perioperative cardiovascular complications. Based on clinical review performed today (04/12/24), barring any significant acute changes in the patient's overall condition, it is anticipated that he will  be able to proceed with the planned surgical intervention. Any acute changes in clinical condition may necessitate his procedure being postponed and/or cancelled. Patient will meet with anesthesia team (MD and/or CRNA) on the day of his procedure for preoperative evaluation/assessment. Questions regarding anesthetic course will be fielded at that time.   Pre-surgical instructions were reviewed with the patient during his PAT appointment, and questions were fielded to satisfaction by PAT clinical staff. He has been instructed on which medications that he will need to hold prior to surgery, as well as the ones that have been deemed safe/appropriate to take on the day of his procedure. As part of the general education provided by PAT, patient made aware both verbally and in writing, that he would need to abstain from the use of any illegal substances during his perioperative course. He was advised that failure to follow the provided instructions could necessitate case cancellation or result in serious perioperative complications up to and including death. Patient encouraged to contact PAT and/or his surgeon's office to discuss any questions or concerns that may arise prior to surgery; verbalized understanding.   Renate Caroline, MSN, APRN, FNP-C, CEN [facility]  Perioperative Services Nurse Practitioner Phone: [phone] Fax: [phone] 04/12/24 9:52 AM  NOTE: This note has been prepared using Dragon dictation software. Despite my best ability to proofread, there is always the potential that unintentional transcriptional errors may still occur from this process.

## 2024-04-15 ENCOUNTER — Encounter: Payer: Self-pay | Admitting: Family Medicine

## 2024-04-15 MED ORDER — CEFAZOLIN SODIUM-DEXTROSE 2-4 GM/100ML-% IV SOLN
2.0000 g | INTRAVENOUS | Status: AC
  Administered 2024-04-16: 2 g via INTRAVENOUS

## 2024-04-15 MED ORDER — SODIUM CHLORIDE 0.9 % IV SOLN: INTRAVENOUS | Status: DC

## 2024-04-15 MED ORDER — ORAL CARE MOUTH RINSE: 15.0000 mL | Freq: Once | OROMUCOSAL | Status: AC

## 2024-04-15 MED ORDER — CHLORHEXIDINE GLUCONATE 0.12 % MT SOLN
15.0000 mL | Freq: Once | OROMUCOSAL | Status: AC
  Administered 2024-04-16: 15 mL via OROMUCOSAL

## 2024-04-16 ENCOUNTER — Ambulatory Visit: Payer: Self-pay | Admitting: Urgent Care

## 2024-04-16 ENCOUNTER — Ambulatory Visit
Admission: RE | Admit: 2024-04-16 | Discharge: 2024-04-16 | Disposition: A | Discharge status: Home / Self Care | Attending: Urology | Admitting: Urology

## 2024-04-16 ENCOUNTER — Encounter: Payer: Self-pay | Admitting: Urology

## 2024-04-16 ENCOUNTER — Other Ambulatory Visit: Payer: Self-pay

## 2024-04-16 ENCOUNTER — Encounter
Admission: RE | Disposition: A | Discharge status: Home / Self Care | Payer: Self-pay | Source: Home / Self Care | Attending: Urology

## 2024-04-16 ENCOUNTER — Encounter: Admission: RE | Payer: Self-pay | Source: Ambulatory Visit

## 2024-04-16 DIAGNOSIS — C61 Malignant neoplasm of prostate: Secondary | ICD-10-CM | POA: Insufficient documentation

## 2024-04-16 DIAGNOSIS — N475 Adhesions of prepuce and glans penis: Secondary | ICD-10-CM | POA: Insufficient documentation

## 2024-04-16 DIAGNOSIS — N401 Enlarged prostate with lower urinary tract symptoms: Secondary | ICD-10-CM | POA: Diagnosis not present

## 2024-04-16 DIAGNOSIS — I10 Essential (primary) hypertension: Secondary | ICD-10-CM | POA: Insufficient documentation

## 2024-04-16 DIAGNOSIS — Z6838 Body mass index (BMI) 38.0-38.9, adult: Secondary | ICD-10-CM | POA: Diagnosis not present

## 2024-04-16 DIAGNOSIS — N478 Other disorders of prepuce: Secondary | ICD-10-CM

## 2024-04-16 DIAGNOSIS — K219 Gastro-esophageal reflux disease without esophagitis: Secondary | ICD-10-CM | POA: Insufficient documentation

## 2024-04-16 DIAGNOSIS — I2510 Atherosclerotic heart disease of native coronary artery without angina pectoris: Secondary | ICD-10-CM | POA: Insufficient documentation

## 2024-04-16 DIAGNOSIS — N481 Balanitis: Secondary | ICD-10-CM | POA: Diagnosis not present

## 2024-04-16 DIAGNOSIS — E119 Type 2 diabetes mellitus without complications: Secondary | ICD-10-CM | POA: Diagnosis not present

## 2024-04-16 DIAGNOSIS — G4733 Obstructive sleep apnea (adult) (pediatric): Secondary | ICD-10-CM | POA: Insufficient documentation

## 2024-04-16 DIAGNOSIS — N4889 Other specified disorders of penis: Secondary | ICD-10-CM | POA: Diagnosis not present

## 2024-04-16 DIAGNOSIS — Z01818 Encounter for other preprocedural examination: Secondary | ICD-10-CM

## 2024-04-16 HISTORY — DX: Rheumatoid arthritis, unspecified: M06.9

## 2024-04-16 HISTORY — DX: Other cervical disc degeneration, unspecified cervical region: M50.30

## 2024-04-16 HISTORY — DX: Other intervertebral disc degeneration, lumbar region without mention of lumbar back pain or lower extremity pain: M51.369

## 2024-04-16 HISTORY — DX: Palpitations: R00.2

## 2024-04-16 HISTORY — PX: LESION DESTRUCTION: SHX5132

## 2024-04-16 HISTORY — PX: PENILE BIOPSY: SHX6013

## 2024-04-16 HISTORY — DX: Benign prostatic hyperplasia without lower urinary tract symptoms: N40.0

## 2024-04-16 HISTORY — PX: CYSTOSCOPY: SHX5120

## 2024-04-16 LAB — GLUCOSE, CAPILLARY
Glucose-Capillary: 109 mg/dL — ABNORMAL HIGH (ref 70–99)
Glucose-Capillary: 99 mg/dL (ref 70–99)

## 2024-04-16 SURGERY — DESTRUCTION, LESION, PENIS
Anesthesia: General

## 2024-04-16 MED ORDER — PHENYLEPHRINE 80 MCG/ML (10ML) SYRINGE FOR IV PUSH (FOR BLOOD PRESSURE SUPPORT)
PREFILLED_SYRINGE | INTRAVENOUS | Status: DC | PRN
  Administered 2024-04-16 (×3): 80 ug via INTRAVENOUS

## 2024-04-16 MED ORDER — OXYCODONE HCL 5 MG PO TABS
ORAL_TABLET | ORAL | Status: AC
  Filled 2024-04-16: qty 1

## 2024-04-16 MED ORDER — FENTANYL CITRATE (PF) 100 MCG/2ML IJ SOLN
25.0000 ug | INTRAMUSCULAR | Status: DC | PRN
  Administered 2024-04-16 (×4): 25 ug via INTRAVENOUS

## 2024-04-16 MED ORDER — ACETAMINOPHEN 500 MG PO TABS
1000.0000 mg | ORAL_TABLET | Freq: Once | ORAL | Status: AC | PRN
  Administered 2024-04-16: 1000 mg via ORAL

## 2024-04-16 MED ORDER — STERILE WATER FOR IRRIGATION IR SOLN
Status: DC | PRN
  Administered 2024-04-16: 3000 mL

## 2024-04-16 MED ORDER — FENTANYL CITRATE (PF) 100 MCG/2ML IJ SOLN
INTRAMUSCULAR | Status: DC | PRN
  Administered 2024-04-16: 25 ug via INTRAVENOUS
  Administered 2024-04-16: 50 ug via INTRAVENOUS
  Administered 2024-04-16: 25 ug via INTRAVENOUS

## 2024-04-16 MED ORDER — ONDANSETRON HCL 4 MG/2ML IJ SOLN
INTRAMUSCULAR | Status: DC | PRN
  Administered 2024-04-16: 4 mg via INTRAVENOUS

## 2024-04-16 MED ORDER — MIDAZOLAM HCL 2 MG/2ML IJ SOLN
INTRAMUSCULAR | Status: DC | PRN
  Administered 2024-04-16 (×2): 1 mg via INTRAVENOUS

## 2024-04-16 MED ORDER — OXYCODONE HCL 5 MG PO TABS
5.0000 mg | ORAL_TABLET | Freq: Once | ORAL | Status: AC | PRN
  Administered 2024-04-16: 5 mg via ORAL

## 2024-04-16 MED ORDER — DEXAMETHASONE SODIUM PHOSPHATE 10 MG/ML IJ SOLN
INTRAMUSCULAR | Status: DC | PRN
  Administered 2024-04-16: 5 mg via INTRAVENOUS

## 2024-04-16 MED ORDER — OXYCODONE HCL 5 MG PO TABS: 5.0000 mg | ORAL_TABLET | Freq: Four times a day (QID) | ORAL | 0 refills | Status: DC | PRN

## 2024-04-16 MED ORDER — STERILE WATER FOR IRRIGATION IR SOLN
Status: DC | PRN
  Administered 2024-04-16: 500 mL

## 2024-04-16 MED ORDER — CHLORHEXIDINE GLUCONATE 0.12 % MT SOLN
OROMUCOSAL | Status: AC
  Filled 2024-04-16: qty 15

## 2024-04-16 MED ORDER — ONDANSETRON HCL 4 MG/2ML IJ SOLN
INTRAMUSCULAR | Status: AC
  Filled 2024-04-16: qty 2

## 2024-04-16 MED ORDER — BACITRACIN ZINC 500 UNIT/GM EX OINT
TOPICAL_OINTMENT | CUTANEOUS | Status: AC
  Filled 2024-04-16: qty 28.35

## 2024-04-16 MED ORDER — PROPOFOL 10 MG/ML IV BOLUS
INTRAVENOUS | Status: DC | PRN
Start: 2024-04-16 — End: 2024-04-16
  Administered 2024-04-16: 200 mg via INTRAVENOUS

## 2024-04-16 MED ORDER — MIDAZOLAM HCL 2 MG/2ML IJ SOLN
INTRAMUSCULAR | Status: AC
  Filled 2024-04-16: qty 2

## 2024-04-16 MED ORDER — CEFAZOLIN SODIUM-DEXTROSE 2-4 GM/100ML-% IV SOLN
INTRAVENOUS | Status: AC
  Filled 2024-04-16: qty 100

## 2024-04-16 MED ORDER — EPHEDRINE 5 MG/ML INJ
INTRAVENOUS | Status: AC
  Filled 2024-04-16: qty 5

## 2024-04-16 MED ORDER — SODIUM CHLORIDE 0.9 % IR SOLN: Status: DC | PRN

## 2024-04-16 MED ORDER — PHENYLEPHRINE 80 MCG/ML (10ML) SYRINGE FOR IV PUSH (FOR BLOOD PRESSURE SUPPORT)
PREFILLED_SYRINGE | INTRAVENOUS | Status: AC
  Filled 2024-04-16: qty 10

## 2024-04-16 MED ORDER — EPHEDRINE SULFATE-NACL 50-0.9 MG/10ML-% IV SOSY
PREFILLED_SYRINGE | INTRAVENOUS | Status: DC | PRN
  Administered 2024-04-16: 5 mg via INTRAVENOUS

## 2024-04-16 MED ORDER — FENTANYL CITRATE (PF) 100 MCG/2ML IJ SOLN
INTRAMUSCULAR | Status: AC
  Filled 2024-04-16: qty 2

## 2024-04-16 MED ORDER — LIDOCAINE HCL (PF) 2 % IJ SOLN
INTRAMUSCULAR | Status: AC
  Filled 2024-04-16: qty 5

## 2024-04-16 MED ORDER — DEXAMETHASONE SODIUM PHOSPHATE 10 MG/ML IJ SOLN
INTRAMUSCULAR | Status: AC
  Filled 2024-04-16: qty 1

## 2024-04-16 MED ORDER — BACITRACIN 500 UNIT/GM EX OINT
TOPICAL_OINTMENT | CUTANEOUS | Status: DC | PRN
  Administered 2024-04-16: 1 via TOPICAL

## 2024-04-16 MED ORDER — PROPOFOL 1000 MG/100ML IV EMUL
INTRAVENOUS | Status: AC
  Filled 2024-04-16: qty 100

## 2024-04-16 MED ORDER — LIDOCAINE HCL (CARDIAC) PF 100 MG/5ML IV SOSY
PREFILLED_SYRINGE | INTRAVENOUS | Status: DC | PRN
  Administered 2024-04-16: 80 mg via INTRAVENOUS

## 2024-04-16 MED ORDER — OXYCODONE HCL 5 MG/5ML PO SOLN: 5.0000 mg | Freq: Once | ORAL | Status: AC | PRN

## 2024-04-16 MED ORDER — ACETAMINOPHEN 500 MG PO TABS
ORAL_TABLET | ORAL | Status: AC
  Filled 2024-04-16: qty 2

## 2024-04-16 SURGICAL SUPPLY — 36 items
BAG DRAIN SIEMENS DORNER NS (MISCELLANEOUS) ×1 IMPLANT
BANDAGE GAUZE 1X75IN STRL (MISCELLANEOUS) IMPLANT
BLADE CLIPPER SURG (BLADE) ×1 IMPLANT
BLADE SURG 15 STRL LF DISP TIS (BLADE) ×1 IMPLANT
BNDG COHESIVE 1X5 TAN NS LF (GAUZE/BANDAGES/DRESSINGS) IMPLANT
CATH ROBINSON RED A/P 16FR (CATHETERS) IMPLANT
CHLORAPREP W/TINT 26 (MISCELLANEOUS) ×1 IMPLANT
DRAPE LAPAROTOMY 77X122 PED (DRAPES) ×1 IMPLANT
DRSG TELFA 3X4 N-ADH STERILE (GAUZE/BANDAGES/DRESSINGS) IMPLANT
ELECTRODE REM PT RTRN 9FT ADLT (ELECTROSURGICAL) ×1 IMPLANT
GAUZE 4X4 16PLY ~~LOC~~+RFID DBL (SPONGE) ×1 IMPLANT
GAUZE PETROLATUM 1 X8 (GAUZE/BANDAGES/DRESSINGS) ×1 IMPLANT
GAUZE STRETCH 2X75IN STRL (MISCELLANEOUS) ×1 IMPLANT
GLOVE BIOGEL PI IND STRL 7.5 (GLOVE) ×1 IMPLANT
GOWN STRL REUS W/ TWL LRG LVL3 (GOWN DISPOSABLE) ×1 IMPLANT
GOWN STRL REUS W/ TWL XL LVL3 (GOWN DISPOSABLE) ×1 IMPLANT
KIT TURNOVER CYSTO (KITS) ×1 IMPLANT
KIT TURNOVER KIT A (KITS) ×1 IMPLANT
LABEL OR SOLS (LABEL) ×1 IMPLANT
MANIFOLD NEPTUNE II (INSTRUMENTS) ×1 IMPLANT
NDL HYPO 25X1 1.5 SAFETY (NEEDLE) ×1 IMPLANT
NEEDLE HYPO 25X1 1.5 SAFETY (NEEDLE) ×1 IMPLANT
NS IRRIG 500ML POUR BTL (IV SOLUTION) ×1 IMPLANT
PACK BASIN MINOR ARMC (MISCELLANEOUS) ×1 IMPLANT
PACK CYSTO AR (MISCELLANEOUS) ×1 IMPLANT
PENCIL SMOKE EVACUATOR (MISCELLANEOUS) IMPLANT
SET CYSTO W/LG BORE CLAMP LF (SET/KITS/TRAYS/PACK) ×1 IMPLANT
SOLUTION PREP PVP 2OZ (MISCELLANEOUS) ×1 IMPLANT
SURGILUBE 2OZ TUBE FLIPTOP (MISCELLANEOUS) ×1 IMPLANT
SUT CHROMIC 3 0 PS 2 (SUTURE) ×2 IMPLANT
SUT CHROMIC 4 0 RB 1X27 (SUTURE) IMPLANT
SYR 10ML LL (SYRINGE) ×1 IMPLANT
SYRINGE TOOMEY IRRIG 70ML (MISCELLANEOUS) ×1 IMPLANT
TRAP FLUID SMOKE EVACUATOR (MISCELLANEOUS) ×1 IMPLANT
WATER STERILE IRR 3000ML UROMA (IV SOLUTION) ×1 IMPLANT
WATER STERILE IRR 500ML POUR (IV SOLUTION) ×1 IMPLANT

## 2024-04-16 NOTE — Transfer of Care (Signed)
 Immediate Anesthesia Transfer of Care Note  Patient: Justin Soto  Procedure(s) Performed: DESTRUCTION, LESION, PENIS BIOPSY, PENIS CYSTOSCOPY  Patient Location: PACU  Anesthesia Type:General  Level of Consciousness: awake  Airway & Oxygen Therapy: Patient Spontanous Breathing and Patient connected to face mask oxygen  Post-op Assessment: Report given to RN and Post -op Vital signs reviewed and stable  Post vital signs: Reviewed and stable  Last Vitals:  Vitals Value Taken Time  BP 135/76 04/16/24 1146  Temp    Pulse 85 04/16/24 1151  Resp 19 04/16/24 1151  SpO2 98 % 04/16/24 1151  Vitals shown include unfiled device data.  Last Pain:  Vitals:   04/16/24 1002  TempSrc: Temporal  PainSc: 0-No pain         Complications: No notable events documented.

## 2024-04-16 NOTE — Discharge Instructions (Signed)
 Postoperative instructions   Wound:  You will note redness and swelling of the head and shaft of the penis.  Apply bacitracin  ointment to the area 3 times daily.  Retract skin away from the head of the penis to prevent adherence soft tissue to the head of the penis.  You do have some sutures where adhesions were taken down and where the the penile biopsy was performed.  The sutures will dissolve on their own.  Diet:  You may return to your normal diet within 24 hours following your surgery. You may note some mild nausea and possibly vomiting the first 6-8 hours following surgery. This is usually due to the side effects of anesthesia, and will disappear quite soon. I would suggest clear liquids and a very light meal the first evening following your surgery.  Activity:  Your physical activity should be restricted the first 48 hours. During that time you should remain relatively inactive, moving about only when necessary. During the first 7 days following surgery he should avoid lifting any heavy objects (anything greater than 15 pounds), and avoid strenuous exercise. If you work, ask us  specifically about your restrictions, both for work and home. We will write a note to your employer if needed.  No intercourse or masturbation for 4 weeks  Ice packs can be placed on and off over the penis for the first 48 hours to help relieve the pain and keep the swelling down. Frozen peas or corn in a ZipLock bag can be frozen, used and re-frozen. Fifteen minutes on and 15 minutes off is a reasonable schedule.   Dressing:  Your dressing consist of 3 layers: An inner layer of Vaseline gauze around the incision, a gauze dressing around the penis and a mesh dressing to hold in place.  You may remove this dressing in 48 hours.  Remove earlier if it gets saturated with urine or falls over the head of the penis.  If the Vaseline gauze sticks to the incision you may remove in the shower.  Hygiene:  You may shower  48 hours after your surgery. Tub bathing should be restricted until the seventh day.  Medication:  You will be sent home with some type of pain medication. In many cases you will be sent home with a narcotic pain pill (Vicodin or Tylox). If the pain is not too bad, you may take either Tylenol  (acetaminophen ) or Advil  (ibuprofen ) which contain no narcotic agents, and might be tolerated a little better, with fewer side effects. If the pain medication you are sent home with does not control the pain, you will have to let us  know. Some narcotic pain medications cannot be given or refilled by a phone call to a pharmacy.  Problems you should report to us :  Fever of 101.0 degrees Fahrenheit or greater. Moderate or severe swelling under the skin incision or involving the scrotum. Drug reaction such as hives, a rash, nausea or vomiting.

## 2024-04-16 NOTE — Anesthesia Procedure Notes (Signed)
 Procedure Name: LMA Insertion Date/Time: 04/16/2024 10:42 AM  Performed by: Juanda Noon, CRNAPre-anesthesia Checklist: Patient identified, Emergency Drugs available, Suction available and Patient being monitored Patient Re-evaluated:Patient Re-evaluated prior to induction Oxygen Delivery Method: Circle system utilized Preoxygenation: Pre-oxygenation with 100% oxygen Induction Type: IV induction Ventilation: Mask ventilation without difficulty LMA: LMA flexible inserted LMA Size: 5.0 Tube type: Oral Number of attempts: 2 Airway Equipment and Method: Oral airway Placement Confirmation: positive ETCO2 and breath sounds checked- equal and bilateral Tube secured with: Tape Dental Injury: Teeth and Oropharynx as per pre-operative assessment

## 2024-04-16 NOTE — Op Note (Signed)
   Preoperative diagnosis:  Penile adhesions Penile lesion  Postoperative diagnosis:  Same  Procedure: Lysis of penile adhesions Penile biopsy Cystoscopy  Surgeon: Geraline Knapp, MD  Anesthesia: General  Complications: None  Intraoperative findings:  Cystoscopy-wide caliber bulbar urethral stricture; moderate lateral lobe enlargement.  Bladder mucosa without erythema, solid or papillary lesions.  Mild trabeculation 1 cm erythematous area left distal shaft.  Marked adhesions distal penile shaft skin to ventral glans.  Additional adhesions dorsal shaft  EBL: Minimal  Specimens: Penile biopsy  Indication: Justin Soto is a 63 y.o. male who is circumcised however has chronic penile adhesions and a nonhealing inflamed area penile shaft which has not responded to topical steroid.  Refer to admission H&P for details.  After reviewing the management options for treatment, he elected to proceed with the above surgical procedure(s). We have discussed the potential benefits and risks of the procedure, side effects of the proposed treatment, the likelihood of the patient achieving the goals of the procedure, and any potential problems that might occur during the procedure or recuperation. Informed consent has been obtained.  Description of procedure:  The patient was taken to the operating room and general anesthesia was induced.  The patient was placed in the dorsal lithotomy position, prepped and draped in the usual sterile fashion, and preoperative antibiotics were administered. A preoperative time-out was performed.   A 17 French cystoscope was lubricated, inserted per urethra and advanced into the bladder under direct vision with findings as described above.  The cystoscope was removed and attention was directed to adhesions dorsal glans.  Using hemostatic pressure the adhesions were lysed.  A few bands were developed with a hemostat, clamped and sharply divided.  A red rubber catheter  was inserted per urethra and the ventral adhesions were then developed.  A plane was developed and these adhesions were bluntly lysed in a similar fashion.  Once all lesions were completely lysed there was an oozing area just distal to the corona dorsally which was closed with interrupted 4-0 chromic suture.  A similar area ventrally was noted and closed in a similar fashion.  A portion of the nonhealing area of erythema was elevated with Adson forceps and excisional biopsy was performed with Metzenbaum scissors.  This area was closed with interrupted 4-0 chromic suture.  At the completion of the procedure hemostasis was excellent.  Antibiotic ointment was applied to the area of lysed adhesions and Vaseline gauze was then placed followed by a conformer dressing and StretchNet  After anesthetic reversal he was transported to the PACU in stable condition.  Plan: Follow-up postop visit 1 month He will be contacted with the biopsy results   Geraline Knapp, M.D.

## 2024-04-16 NOTE — Anesthesia Preprocedure Evaluation (Signed)
 Anesthesia Evaluation  Patient identified by MRN, date of birth, ID band Patient awake    Reviewed: Allergy & Precautions, NPO status , Patient's Chart, lab work & pertinent test results  History of Anesthesia Complications Negative for: history of anesthetic complications  Airway Mallampati: III  TM Distance: <3 FB Neck ROM: full    Dental  (+) Chipped   Pulmonary sleep apnea and Continuous Positive Airway Pressure Ventilation    Pulmonary exam normal        Cardiovascular Exercise Tolerance: Good hypertension, (-) angina + CAD  (-) Past MI Normal cardiovascular exam     Neuro/Psych  Neuromuscular disease    GI/Hepatic Neg liver ROS,GERD  Controlled,,  Endo/Other  negative endocrine ROSdiabetes, Type 2    Renal/GU Renal disease     Musculoskeletal   Abdominal   Peds  Hematology negative hematology ROS (+)   Anesthesia Other Findings Past Medical History: No date: ADHD (attention deficit hyperactivity disorder) No date: Anxiety No date: Arthritis No date: BPH (benign prostatic hyperplasia) No date: Coronary artery disease No date: DDD (degenerative disc disease), cervical     Comment:  a.) a/p ACDF C3-C7 No date: DDD (degenerative disc disease), lumbar     Comment:  a.) s/p laminectomy and fusion L3-L5; b.) s/p               laminectomy L5-S1 No date: DM (diabetes mellitus), type 2 (HCC) No date: Dyspnea No date: Environmental and seasonal allergies No date: Family history of adverse reaction to anesthesia     Comment:  a.) PONV in 1st degree relative (mother) No date: GERD (gastroesophageal reflux disease) No date: History of kidney stones No date: History of prostatitis No date: HOH (hard of hearing); wears bilateral hearing aids No date: Hyperlipidemia No date: Hypertension No date: IDA (iron deficiency anemia) No date: Inguinal hernia No date: Leg pain, right No date: Lumbar stenosis with  neurogenic claudication No date: Morbid obesity (HCC) No date: MRSA (methicillin resistant staph aureus) culture positive No date: Neck pain of over 3 months duration No date: Obesity No date: OSA on CPAP No date: Palpitations No date: Penile adhesions No date: Prostate cancer (HCC) No date: RA (rheumatoid arthritis) (HCC)     Comment:  a.) followed by rheumatolofy; negative rheumatoid               factor; Dx based on pattern of joint involvement, gelling              phenomenon, and mildly prolonged morning stiffness No date: Thalassemia No date: Tick fever No date: Umbilical hernia  Past Surgical History: 11/13/2014: ANTERIOR CERVICAL DECOMP/DISCECTOMY FUSION; N/A     Comment:  Procedure: Cervical three-four, Cervical six-seven               anterior cervical decompression with fusion interbody               prosthesis plating and bonegraft;  Surgeon: Garry Kansas, MD;  Location: [facility] OR;  Service: Neurosurgery;                Laterality: N/A;  Cervical three-four, Cervical six-seven              anterior cervical decompression with fusion interbody               prosthesis plating and bonegraft 01/11/2017: CALCANEAL OSTEOTOMY; Right     Comment:  Procedure:  CALCANEAL OSTEOTOMY;  Surgeon: Marlynn Singer,              MD;  Location: [facility] ORS;  Service: Orthopedics;                Laterality: Right; 12/13/2017: CIRCUMCISION; N/A     Comment:  Procedure: PENILE BLOCK, LYSIS OF PENILE ADHESIONS;                Surgeon: Dustin Gimenez, MD;  Location: [facility] ORS;                Service: Urology;  Laterality: N/A; No date: COLONOSCOPY 09/15/2021: COLONOSCOPY WITH PROPOFOL ; N/A     Comment:  Procedure: COLONOSCOPY WITH PROPOFOL ;  Surgeon: Toledo,               Alphonsus Jeans, MD;  Location: [facility] ENDOSCOPY;  Service:               Gastroenterology;  Laterality: N/A;  DM 12/13/2017: CYSTOSCOPY; N/A     Comment:  Procedure: CYSTOSCOPY;  Surgeon: Dustin Gimenez, MD;                 Location: [facility] ORS;  Service: Urology;  Laterality: N/A; 11/24/2020: DIRECT LARYNGOSCOPY; N/A     Comment:  Procedure: MICRO DIRECT LARYNGOSCOPY W/BIOPSY;  Surgeon:              Lesly Raspberry, MD;  Location: [facility] ORS;  Service: ENT;              Laterality: N/A; No date: HYDROCELE EXCISION / REPAIR No date: INGUINAL HERNIA REPAIR; Left No date: LEG SURGERY; Right     Comment:  Had surgery on right calf X 7 No date: LITHOTRIPSY No date: LUMBAR LAMINECTOMY (L5-S1); N/A No date: LUMBAR LAMINECTOMY AND FUSION (L3-L5); N/A 11/28/2019: LUMBAR WOUND DEBRIDEMENT; N/A     Comment:  Procedure: LUMBAR WOUND DEBRIDEMENT;  Surgeon: Garry Kansas, MD;  Location: [facility] OR;  Service: Neurosurgery;                Laterality: N/A;  LUMBAR WOUND DEBRIDEMENT 06/22/2015: QUADRICEPS TENDON REPAIR; Left     Comment:  Procedure: REPAIR QUADRICEP TENDON;  Surgeon: Marlynn Singer, MD;  Location: [facility] ORS;  Service: Orthopedics;                Laterality: Left; 08/12/2015: QUADRICEPS TENDON REPAIR; Left     Comment:  Procedure: REPAIR QUADRICEP TENDON;  Surgeon: Marlynn Singer, MD;  Location: [facility] ORS;  Service: Orthopedics;                Laterality: Left; No date: ROTATOR CUFF REPAIR; Left No date: TONSILLECTOMY No date: VASECTOMY  BMI    Body Mass Index: 38.74 kg/m      Reproductive/Obstetrics negative OB ROS                             Anesthesia Physical Anesthesia Plan  ASA: 3  Anesthesia Plan: General LMA   Post-op Pain Management:    Induction: Intravenous  PONV Risk Score and Plan: Dexamethasone , Ondansetron , Midazolam  and Treatment may vary due to age or medical condition  Airway Management Planned: LMA  Additional Equipment:   Intra-op Plan:  Post-operative Plan: Extubation in OR  Informed Consent: I have reviewed the patients History and Physical, chart, labs and discussed the procedure including the  risks, benefits and alternatives for the proposed anesthesia with the patient or authorized representative who has indicated his/her understanding and acceptance.     Dental Advisory Given  Plan Discussed with: Anesthesiologist, CRNA and Surgeon  Anesthesia Plan Comments: (Patient consented for risks of anesthesia including but not limited to:  - adverse reactions to medications - damage to eyes, teeth, lips or other oral mucosa - nerve damage due to positioning  - sore throat or hoarseness - Damage to heart, brain, nerves, lungs, other parts of body or loss of life  Patient voiced understanding and assent.)       Anesthesia Quick Evaluation

## 2024-04-16 NOTE — Interval H&P Note (Signed)
 History and Physical Interval Note:  04/16/2024 10:35 AM  Justin Soto  has presented today for surgery, with the diagnosis of Penile Adhesions.  The various methods of treatment have been discussed with the patient and family. After consideration of risks, benefits and other options for treatment, the patient has consented to  Procedure(s): DESTRUCTION, LESION, PENIS (N/A) BIOPSY, PENIS (N/A) CYSTOSCOPY (N/A) as a surgical intervention.  The patient's history has been reviewed, patient examined, no change in status, stable for surgery.  I have reviewed the patient's chart and labs.  Questions were answered to the patient's satisfaction.    Patient with penile adhesions.  Was scheduled for lysis of adhesion and possible penile biopsy with Dr. Ace Holder last month however her schedule had to be canceled that day.  We discussed the procedure including possibility of recurrent adhesions.  CV: RRR Lungs: Clear   Hibah Odonnell C Danaja Lasota

## 2024-04-16 NOTE — Anesthesia Postprocedure Evaluation (Signed)
 Anesthesia Post Note  Patient: Justin Soto  Procedure(s) Performed: DESTRUCTION, LESION, PENIS BIOPSY, PENIS CYSTOSCOPY  Patient location during evaluation: PACU Anesthesia Type: General Level of consciousness: awake and alert Pain management: pain level controlled Vital Signs Assessment: post-procedure vital signs reviewed and stable Respiratory status: spontaneous breathing, nonlabored ventilation, respiratory function stable and patient connected to nasal cannula oxygen Cardiovascular status: blood pressure returned to baseline and stable Postop Assessment: no apparent nausea or vomiting Anesthetic complications: no   No notable events documented.   Last Vitals:  Vitals:   04/16/24 1215 04/16/24 1230  BP: (!) 147/66 137/74  Pulse: 88 83  Resp: 14 14  Temp:  36.4 C  SpO2: 92% 94%    Last Pain:  Vitals:   04/16/24 1230  TempSrc:   PainSc: 3                  Portia Brittle Kenyata Napier

## 2024-04-17 ENCOUNTER — Encounter: Payer: Self-pay | Admitting: Urology

## 2024-04-17 LAB — SURGICAL PATHOLOGY

## 2024-04-18 ENCOUNTER — Encounter: Payer: Self-pay | Admitting: Urology

## 2024-04-19 ENCOUNTER — Other Ambulatory Visit: Payer: Self-pay | Admitting: Urology

## 2024-04-20 DIAGNOSIS — G4733 Obstructive sleep apnea (adult) (pediatric): Secondary | ICD-10-CM | POA: Diagnosis not present

## 2024-04-23 ENCOUNTER — Telehealth: Payer: Self-pay

## 2024-04-23 NOTE — Telephone Encounter (Signed)
 Pt Justin Soto on triage line stating that he had surgery on 04/16/24 and has since noticed pus from incision site and is worried about infection.   Called pt back to offer appointment. No answer. Justin Soto for pt to call back.

## 2024-04-24 ENCOUNTER — Ambulatory Visit (INDEPENDENT_AMBULATORY_CARE_PROVIDER_SITE_OTHER): Admitting: Physician Assistant

## 2024-04-24 ENCOUNTER — Encounter: Payer: Self-pay | Admitting: Physician Assistant

## 2024-04-24 VITALS — BP 126/83 | HR 99 | Ht 66.0 in | Wt 240.0 lb

## 2024-04-24 DIAGNOSIS — T8149XA Infection following a procedure, other surgical site, initial encounter: Secondary | ICD-10-CM

## 2024-04-24 MED ORDER — NYSTATIN 100000 UNIT/GM EX OINT: 1.0000 | TOPICAL_OINTMENT | Freq: Two times a day (BID) | CUTANEOUS | 0 refills | Status: DC

## 2024-04-24 MED ORDER — MUPIROCIN 2 % EX OINT: 1.0000 | TOPICAL_OINTMENT | Freq: Two times a day (BID) | CUTANEOUS | 0 refills | Status: DC

## 2024-04-24 NOTE — Progress Notes (Signed)
 Error (duplicate); see note under other same-day visit.

## 2024-04-24 NOTE — Progress Notes (Signed)
 04/24/2024 9:59 AM   Justin Soto 09/23/1961 [id number]  CC: Chief Complaint  Patient presents with   Post-op Problem   HPI: Justin Soto is a 63 y.o. male who underwent cystoscopy, penile biopsy, and lysis of penile adhesions with Dr. Cherylene Corrente 8 days ago who presents today for evaluation of possible wound infection.  Today he reports purulent discharge from his surgical site as well as penile discomfort.  He denies fevers.  PMH: Past Medical History:  Diagnosis Date   ADHD (attention deficit hyperactivity disorder)    Anxiety    Arthritis    BPH (benign prostatic hyperplasia)    Coronary artery disease    DDD (degenerative disc disease), cervical    a.) a/p ACDF C3-C7   DDD (degenerative disc disease), lumbar    a.) s/p laminectomy and fusion L3-L5; b.) s/p laminectomy L5-S1   DM (diabetes mellitus), type 2 (HCC)    Dyspnea    Environmental and seasonal allergies    Family history of adverse reaction to anesthesia    a.) PONV in 1st degree relative (mother)   GERD (gastroesophageal reflux disease)    History of kidney stones    History of prostatitis    HOH (hard of hearing); wears bilateral hearing aids    Hyperlipidemia    Hypertension    IDA (iron deficiency anemia)    Inguinal hernia    Leg pain, right    Lumbar stenosis with neurogenic claudication    Morbid obesity (HCC)    MRSA (methicillin resistant staph aureus) culture positive    Neck pain of over 3 months duration    Obesity    OSA on CPAP    Palpitations    Penile adhesions    Prostate cancer (HCC)    RA (rheumatoid arthritis) (HCC)    a.) followed by rheumatolofy; negative rheumatoid factor; Dx based on pattern of joint involvement, gelling phenomenon, and mildly prolonged morning stiffness   Thalassemia    Tick fever    Umbilical hernia     Surgical History: Past Surgical History:  Procedure Laterality Date   ANTERIOR CERVICAL DECOMP/DISCECTOMY FUSION N/A 11/13/2014   Procedure:  Cervical three-four, Cervical six-seven anterior cervical decompression with fusion interbody prosthesis plating and bonegraft;  Surgeon: Garry Kansas, MD;  Location: [facility] OR;  Service: Neurosurgery;  Laterality: N/A;  Cervical three-four, Cervical six-seven anterior cervical decompression with fusion interbody prosthesis plating and bonegraft   CALCANEAL OSTEOTOMY Right 01/11/2017   Procedure: CALCANEAL OSTEOTOMY;  Surgeon: Marlynn Singer, MD;  Location: [facility] ORS;  Service: Orthopedics;  Laterality: Right;   CIRCUMCISION N/A 12/13/2017   Procedure: PENILE BLOCK, LYSIS OF PENILE ADHESIONS;  Surgeon: Dustin Gimenez, MD;  Location: [facility] ORS;  Service: Urology;  Laterality: N/A;   COLONOSCOPY     COLONOSCOPY WITH PROPOFOL  N/A 09/15/2021   Procedure: COLONOSCOPY WITH PROPOFOL ;  Surgeon: Toledo, Alphonsus Jeans, MD;  Location: [facility];  Service: Gastroenterology;  Laterality: N/A;  DM   CYSTOSCOPY N/A 12/13/2017   Procedure: CYSTOSCOPY;  Surgeon: Dustin Gimenez, MD;  Location: [facility] ORS;  Service: Urology;  Laterality: N/A;   CYSTOSCOPY N/A 04/16/2024   Procedure: CYSTOSCOPY;  Surgeon: Geraline Knapp, MD;  Location: [facility] ORS;  Service: Urology;  Laterality: N/A;   DIRECT LARYNGOSCOPY N/A 11/24/2020   Procedure: MICRO DIRECT LARYNGOSCOPY W/BIOPSY;  Surgeon: Lesly Raspberry, MD;  Location: [facility] ORS;  Service: ENT;  Laterality: N/A;   HYDROCELE EXCISION / REPAIR     INGUINAL HERNIA REPAIR Left  LEG SURGERY Right    Had surgery on right calf X 7   LESION DESTRUCTION N/A 04/16/2024   Procedure: DESTRUCTION, LESION, PENIS;  Surgeon: Geraline Knapp, MD;  Location: [facility] ORS;  Service: Urology;  Laterality: N/A;   LITHOTRIPSY     LUMBAR LAMINECTOMY (L5-S1) N/A    LUMBAR LAMINECTOMY AND FUSION (L3-L5) N/A    LUMBAR WOUND DEBRIDEMENT N/A 11/28/2019   Procedure: LUMBAR WOUND DEBRIDEMENT;  Surgeon: Garry Kansas, MD;  Location: [facility] OR;  Service: Neurosurgery;  Laterality: N/A;  LUMBAR WOUND DEBRIDEMENT    PENILE BIOPSY N/A 04/16/2024   Procedure: BIOPSY, PENIS;  Surgeon: Geraline Knapp, MD;  Location: [facility] ORS;  Service: Urology;  Laterality: N/A;   QUADRICEPS TENDON REPAIR Left 06/22/2015   Procedure: REPAIR QUADRICEP TENDON;  Surgeon: Marlynn Singer, MD;  Location: [facility] ORS;  Service: Orthopedics;  Laterality: Left;   QUADRICEPS TENDON REPAIR Left 08/12/2015   Procedure: REPAIR QUADRICEP TENDON;  Surgeon: Marlynn Singer, MD;  Location: [facility] ORS;  Service: Orthopedics;  Laterality: Left;   ROTATOR CUFF REPAIR Left    TONSILLECTOMY     VASECTOMY      Home Medications:  Allergies as of 04/24/2024       Reactions   Tizanidine Nausea Only   Methocarbamol Other (See Comments)   Numbness, and change of taste    Vicodin [hydrocodone -acetaminophen ] Itching   Can take with benadryl          Medication List        Accurate as of Apr 24, 2024  9:59 AM. If you have any questions, ask your nurse or doctor.          amLODipine -benazepril  10-40 MG capsule Commonly known as: LOTREL Take 1 capsule by mouth daily. What changed: when to take this   aspirin EC 81 MG tablet Take 81 mg by mouth in the morning.   atorvastatin  40 MG tablet Commonly known as: LIPITOR Take 1 tablet (40 mg total) by mouth daily. What changed: when to take this   betamethasone  dipropionate 0.05 % cream Apply topically 2 (two) times daily. What changed:  how much to take when to take this reasons to take this   cetirizine  10 MG tablet Commonly known as: ZYRTEC  Take 1 tablet (10 mg total) by mouth daily. Take one tablet daily What changed:  when to take this additional instructions   cyclobenzaprine  10 MG tablet Commonly known as: FLEXERIL  Take 1 tablet (10 mg total) by mouth at bedtime.   dapagliflozin  propanediol 10 MG Tabs tablet Commonly known as: Farxiga  TAKE 1 TABLET BY MOUTH EVERY DAY BEFORE BREAKFAST   finasteride  5 MG tablet Commonly known as: PROSCAR  TAKE 1 TABLET (5 MG TOTAL) BY  MOUTH DAILY. What changed: when to take this   fluconazole  100 MG tablet Commonly known as: DIFLUCAN  Take 1 tablet (100 mg total) by mouth daily. X 7 days   fluticasone  50 MCG/ACT nasal spray Commonly known as: FLONASE  USE TWO SPRAYS IN EACH NOSTRIL EVERY DAY   hydrochlorothiazide  25 MG tablet Commonly known as: HYDRODIURIL  Take 1 tablet (25 mg total) by mouth daily. What changed: when to take this   meloxicam  15 MG tablet Commonly known as: MOBIC  Take 15 mg by mouth in the morning.   metoprolol  succinate 50 MG 24 hr tablet Commonly known as: TOPROL -XL Take 1 tablet (50 mg total) by mouth daily. TAKE WITH OR IMMEDIATELY FOLLOWING A MEAL. What changed: when to take this   montelukast  10 MG tablet Commonly  known as: SINGULAIR  Take 1 tablet (10 mg total) by mouth at bedtime.   multivitamin with minerals Tabs tablet Take 1 tablet by mouth in the morning.   mupirocin  ointment 2 % Commonly known as: BACTROBAN  Apply 1 Application topically 2 (two) times daily. Started by: Kathreen Pare   NON FORMULARY Pt uses a cpap nightly   nystatin  ointment Commonly known as: MYCOSTATIN  Apply 1 Application topically 2 (two) times daily. Started by: Jacob Chamblee   omeprazole 20 MG capsule Commonly known as: PRILOSEC Take 20 mg by mouth every morning. Take 1 capsule (20 mg) by mouth scheduled every morning, may take an additional dose (20 mg) in the evening, if needed for indigestion/heartburn.   oxyCODONE  5 MG immediate release tablet Commonly known as: Oxy IR/ROXICODONE  Take 1-2 tablets (5-10 mg total) by mouth every 6 (six) hours as needed for severe pain (pain score 7-10).   Semaglutide  (2 MG/DOSE) 8 MG/3ML Sopn Inject 2 mg as directed once a week. What changed:  how to take this when to take this   sertraline  50 MG tablet Commonly known as: ZOLOFT  TAKE 1 TABLET BY MOUTH EVERY DAY What changed: when to take this   tamsulosin  0.4 MG Caps capsule Commonly  known as: FLOMAX  TAKE 2 CAPSULES BY MOUTH EVERY DAY What changed:  when to take this additional instructions        Allergies:  Allergies  Allergen Reactions   Tizanidine Nausea Only   Methocarbamol Other (See Comments)    Numbness, and change of taste    Vicodin [Hydrocodone -Acetaminophen ] Itching    Can take with benadryl      Family History: Family History  Problem Relation Age of Onset   Prostate cancer Father    Heart disease Father    Diabetes Father    Alzheimer's disease Father    Anemia Mother    Osteoporosis Mother    Healthy Sister    Diabetes Brother    Hypertension Brother    Anemia Daughter    Diabetes Brother    Kidney disease Neg Hx    Bladder Cancer Neg Hx    Colon cancer Neg Hx     Social History:   reports that he has never smoked. He has never been exposed to tobacco smoke. He has never used smokeless tobacco. He reports that he does not drink alcohol and does not use drugs.  Physical Exam: BP 126/83   Pulse 99   Ht 5\' 6"  (1.676 m)   Wt 240 lb (108.9 kg)   BMI 38.74 kg/m   Constitutional:  Alert and oriented, no acute distress, nontoxic appearing HEENT: [place], AT Cardiovascular: No clubbing, cyanosis, or edema Respiratory: Normal respiratory effort, no increased work of breathing GU: Buried penis.  No skin breaks.  There are well-demarcated red plaques on the glans penis consistent with balanitis.  No erythema of the scrotum or penile shaft.  No edema of the scrotum or penis.  Bilateral descended testicles, nontender. Skin: No rashes, bruises or suspicious lesions Neurologic: Grossly intact, no focal deficits, moving all 4 extremities Psychiatric: Normal mood and affect  Assessment & Plan:   1. Postoperative wound infection (Primary) He appears to be healing well, the physical exam is consistent with possible balanitis.  With recent postop status, we will cover for bacterial and fungal etiologies with topical mupirocin  and nystatin  and have  him follow-up for postop follow-up with Dr. Cherylene Corrente next month. - nystatin  ointment (MYCOSTATIN ); Apply 1 Application topically 2 (two) times daily.  Dispense: 30 g; Refill: 0 - mupirocin  ointment (BACTROBAN ) 2 %; Apply 1 Application topically 2 (two) times daily.  Dispense: 22 g; Refill: 0    Return in about 3 weeks (around 05/15/2024) for Postop f/u with Dr. Cherylene Corrente.  Kathreen Pare, PA-C  [facility] [place] [address], [address] [place], [place] [address] [phone]

## 2024-05-21 DIAGNOSIS — G4733 Obstructive sleep apnea (adult) (pediatric): Secondary | ICD-10-CM | POA: Diagnosis not present

## 2024-05-22 ENCOUNTER — Ambulatory Visit (INDEPENDENT_AMBULATORY_CARE_PROVIDER_SITE_OTHER): Admitting: Urology

## 2024-05-22 VITALS — BP 137/82 | HR 96 | Ht 66.0 in | Wt 238.0 lb

## 2024-05-22 DIAGNOSIS — Z8546 Personal history of malignant neoplasm of prostate: Secondary | ICD-10-CM

## 2024-05-22 DIAGNOSIS — Z9889 Other specified postprocedural states: Secondary | ICD-10-CM | POA: Diagnosis not present

## 2024-05-22 NOTE — Progress Notes (Signed)
 I, Maysun Jamey Mccallum, acting as a scribe for Geraline Knapp, MD., have documented all relevant documentation on the behalf of Geraline Knapp, MD, as directed by Geraline Knapp, MD while in the presence of Geraline Knapp, MD.  05/22/2024 8:57 AM   [name] 08-25-1961 [id number]  Referring provider: Carlean Charter, DO [address] [place],  [place] [address]  Chief Complaint  Patient presents with   Other    Post op visit    HPI: Justin Soto is a 63 y.o. male presents for post-op follow-up.  Status post lysis of severe penile adhesions and biopsy of a non healing erythematous area distal penile shaft. Several adhesions were realized with the combination of blunt and sharp dissection.  States he has noted significant improvement with the resolution of his adhesions Penile biopsy returned marked acute/chronic inflammation; negative for dysplasia/carcinoma.    PMH: Past Medical History:  Diagnosis Date   ADHD (attention deficit hyperactivity disorder)    Anxiety    Arthritis    BPH (benign prostatic hyperplasia)    Coronary artery disease    DDD (degenerative disc disease), cervical    a.) a/p ACDF C3-C7   DDD (degenerative disc disease), lumbar    a.) s/p laminectomy and fusion L3-L5; b.) s/p laminectomy L5-S1   DM (diabetes mellitus), type 2 (HCC)    Dyspnea    Environmental and seasonal allergies    Family history of adverse reaction to anesthesia    a.) PONV in 1st degree relative (mother)   GERD (gastroesophageal reflux disease)    History of kidney stones    History of prostatitis    HOH (hard of hearing); wears bilateral hearing aids    Hyperlipidemia    Hypertension    IDA (iron deficiency anemia)    Inguinal hernia    Leg pain, right    Lumbar stenosis with neurogenic claudication    Morbid obesity (HCC)    MRSA (methicillin resistant staph aureus) culture positive    Neck pain of over 3 months duration    Obesity    OSA on CPAP     Palpitations    Penile adhesions    Prostate cancer (HCC)    RA (rheumatoid arthritis) (HCC)    a.) followed by rheumatolofy; negative rheumatoid factor; Dx based on pattern of joint involvement, gelling phenomenon, and mildly prolonged morning stiffness   Thalassemia    Tick fever    Umbilical hernia     Surgical History: Past Surgical History:  Procedure Laterality Date   ANTERIOR CERVICAL DECOMP/DISCECTOMY FUSION N/A 11/13/2014   Procedure: Cervical three-four, Cervical six-seven anterior cervical decompression with fusion interbody prosthesis plating and bonegraft;  Surgeon: Garry Kansas, MD;  Location: [facility] OR;  Service: Neurosurgery;  Laterality: N/A;  Cervical three-four, Cervical six-seven anterior cervical decompression with fusion interbody prosthesis plating and bonegraft   CALCANEAL OSTEOTOMY Right 01/11/2017   Procedure: CALCANEAL OSTEOTOMY;  Surgeon: Marlynn Singer, MD;  Location: [facility] ORS;  Service: Orthopedics;  Laterality: Right;   CIRCUMCISION N/A 12/13/2017   Procedure: PENILE BLOCK, LYSIS OF PENILE ADHESIONS;  Surgeon: Dustin Gimenez, MD;  Location: [facility] ORS;  Service: Urology;  Laterality: N/A;   COLONOSCOPY     COLONOSCOPY WITH PROPOFOL  N/A 09/15/2021   Procedure: COLONOSCOPY WITH PROPOFOL ;  Surgeon: Toledo, Alphonsus Jeans, MD;  Location: [facility];  Service: Gastroenterology;  Laterality: N/A;  DM   CYSTOSCOPY N/A 12/13/2017   Procedure: CYSTOSCOPY;  Surgeon: Dustin Gimenez,  MD;  Location: [facility] OR[facility];  Service: Urology;  Laterality: N/A;   CYSTOSCOPY N/A 04/16/2024   Procedure: CYSTOSCOPY;  Surgeon: Geraline Knapp, MD;  Location: [facility] OR[facility];  Service: Urology;  Laterality: N/A;   DIRECT LARYNGOSCOPY N/A 11/24/2020   Procedure: MICRO DIRECT LARYNGOSCOPY W/BIOPSY;  Surgeon: Lesly Raspberry, MD;  Location: [facility] OR[facility];  Service: ENT;  Laterality: N/A;   HYDROCELE EXCISION / REPAIR     INGUINAL HERNIA REPAIR Left    LEG SURGERY Right    Had surgery on right calf X 7    LESION DESTRUCTION N/A 04/16/2024   Procedure: DESTRUCTION, LESION, PENIS;  Surgeon: Geraline Knapp, MD;  Location: [facility] OR[facility];  Service: Urology;  Laterality: N/A;   LITHOTRIPSY     LUMBAR LAMINECTOMY (L5-S1) N/A    LUMBAR LAMINECTOMY AND FUSION (L3-L5) N/A    LUMBAR WOUND DEBRIDEMENT N/A 11/28/2019   Procedure: LUMBAR WOUND DEBRIDEMENT;  Surgeon: Garry Kansas, MD;  Location: [facility] OR;  Service: Neurosurgery;  Laterality: N/A;  LUMBAR WOUND DEBRIDEMENT   PENILE BIOPSY N/A 04/16/2024   Procedure: BIOPSY, PENIS;  Surgeon: Geraline Knapp, MD;  Location: [facility] OR[facility];  Service: Urology;  Laterality: N/A;   QUADRICEPS TENDON REPAIR Left 06/22/2015   Procedure: REPAIR QUADRICEP TENDON;  Surgeon: Marlynn Singer, MD;  Location: [facility] OR[facility];  Service: Orthopedics;  Laterality: Left;   QUADRICEPS TENDON REPAIR Left 08/12/2015   Procedure: REPAIR QUADRICEP TENDON;  Surgeon: Marlynn Singer, MD;  Location: [facility] OR[facility];  Service: Orthopedics;  Laterality: Left;   ROTATOR CUFF REPAIR Left    TONSILLECTOMY     VASECTOMY      Home Medications:  Allergies as of 05/22/2024       Reactions   Tizanidine Nausea Only   Methocarbamol Other (See Comments)   Numbness, and change of taste    Vicodin [hydrocodone -acetaminophen ] Itching   Can take with benadryl          Medication List        Accurate as of May 22, 2024 11:59 PM. If you have any questions, ask your nurse or doctor.          amLODipine -benazepril  10-40 MG capsule Commonly known as: LOTREL Take 1 capsule by mouth daily. What changed: when to take this   aspirin EC 81 MG tablet Take 81 mg by mouth in the morning.   atorvastatin  40 MG tablet Commonly known as: LIPITOR Take 1 tablet (40 mg total) by mouth daily. What changed: when to take this   betamethasone  dipropionate 0.05 % cream Apply topically 2 (two) times daily. What changed:  how much to take when to take this reasons to take this   cetirizine  10 MG tablet Commonly known as:  ZYRTEC  Take 1 tablet (10 mg total) by mouth daily. Take one tablet daily What changed:  when to take this additional instructions   cyclobenzaprine  10 MG tablet Commonly known as: FLEXERIL  Take 1 tablet (10 mg total) by mouth at bedtime.   dapagliflozin  propanediol 10 MG Tabs tablet Commonly known as: Farxiga  TAKE 1 TABLET BY MOUTH EVERY DAY BEFORE BREAKFAST   finasteride  5 MG tablet Commonly known as: PROSCAR  TAKE 1 TABLET (5 MG TOTAL) BY MOUTH DAILY. What changed: when to take this   fluconazole  100 MG tablet Commonly known as: DIFLUCAN  Take 1 tablet (100 mg total) by mouth daily. X 7 days   fluticasone  50 MCG/ACT nasal spray Commonly known as: FLONASE  USE TWO SPRAYS IN EACH NOSTRIL EVERY DAY   hydrochlorothiazide  25 MG  tablet Commonly known as: HYDRODIURIL  Take 1 tablet (25 mg total) by mouth daily. What changed: when to take this   meloxicam  15 MG tablet Commonly known as: MOBIC  Take 15 mg by mouth in the morning.   metoprolol  succinate 50 MG 24 hr tablet Commonly known as: TOPROL -XL Take 1 tablet (50 mg total) by mouth daily. TAKE WITH OR IMMEDIATELY FOLLOWING A MEAL. What changed: when to take this   montelukast  10 MG tablet Commonly known as: SINGULAIR  Take 1 tablet (10 mg total) by mouth at bedtime.   multivitamin with minerals Tabs tablet Take 1 tablet by mouth in the morning.   mupirocin  ointment 2 % Commonly known as: BACTROBAN  Apply 1 Application topically 2 (two) times daily.   NON FORMULARY Pt uses a cpap nightly   nystatin  ointment Commonly known as: MYCOSTATIN  Apply 1 Application topically 2 (two) times daily.   omeprazole 20 MG capsule Commonly known as: PRILOSEC Take 20 mg by mouth every morning. Take 1 capsule (20 mg) by mouth scheduled every morning, may take an additional dose (20 mg) in the evening, if needed for indigestion/heartburn.   oxyCODONE  5 MG immediate release tablet Commonly known as: Oxy IR/ROXICODONE  Take 1-2 tablets  (5-10 mg total) by mouth every 6 (six) hours as needed for severe pain (pain score 7-10).   Semaglutide  (2 MG/DOSE) 8 MG/3ML Sopn Inject 2 mg as directed once a week. What changed:  how to take this when to take this   sertraline  50 MG tablet Commonly known as: ZOLOFT  TAKE 1 TABLET BY MOUTH EVERY DAY What changed: when to take this   tamsulosin  0.4 MG Caps capsule Commonly known as: FLOMAX  TAKE 2 CAPSULES BY MOUTH EVERY DAY What changed:  when to take this additional instructions        Allergies:  Allergies  Allergen Reactions   Tizanidine Nausea Only   Methocarbamol Other (See Comments)    Numbness, and change of taste    Vicodin [Hydrocodone -Acetaminophen ] Itching    Can take with benadryl      Family History: Family History  Problem Relation Age of Onset   Prostate cancer Father    Heart disease Father    Diabetes Father    Alzheimer's disease Father    Anemia Mother    Osteoporosis Mother    Healthy Sister    Diabetes Brother    Hypertension Brother    Anemia Daughter    Diabetes Brother    Kidney disease Neg Hx    Bladder Cancer Neg Hx    Colon cancer Neg Hx     Social History:  reports that he has never smoked. He has never been exposed to tobacco smoke. He has never used smokeless tobacco. He reports that he does not drink alcohol and does not use drugs.   Physical Exam: BP 137/82   Pulse 96   Ht 5' 6 (1.676 m)   Wt 238 lb (108 kg)   BMI 38.41 kg/m   Constitutional:  Alert and oriented, No acute distress. HEENT: [place] AT Respiratory: Normal respiratory effort, no increased work of breathing. GU: The glans is completely exposed.  Persistent corona adhesions, which were noted to be fused intraoperatively  Psychiatric: Normal mood and affect.   Assessment & Plan:    1.Status post-lysis penile adhesions Doing well  2. Benign penile biopsy  3. History of low-risk prostate cancer Will schedule his regular follow-up.  I have reviewed the  above documentation for accuracy and completeness, and I agree  with the above.   Geraline Knapp, MD  [facility] [address], [address] [place], [place] [address] [phone]

## 2024-05-22 NOTE — Progress Notes (Signed)
 error

## 2024-05-24 ENCOUNTER — Encounter: Payer: Self-pay | Admitting: Urology

## 2024-06-03 NOTE — Progress Notes (Unsigned)
 Patient ID: Justin Soto, male   DOB: October 25, 1961, 63 y.o.   MRN: [id number]  Chief Complaint: Umbilical bulge,  History of Present Illness Justin Soto is a 63 y.o. male with a self diagnosed umbilical hernia, progressively present for the last month or more.  He has lost weight, and has been doing a lot of walking.  He is retired and does not utilize a lot of heavy lifting.  He does note as the day wears on with increased activity that the bulge becomes more prominent and has become progressively more tender.  He has a known diastases recti which is asymptomatic.  He denies any bowel issues, denies nausea, vomiting, fevers and chills.  No prior abdominal surgery aside from an inguinal hernia repair.  He has been having weekly Ozempic  injections to assist with his weight loss.  He is well aware of needing to withhold this prior to surgery.  Past Medical History Past Medical History:  Diagnosis Date   ADHD (attention deficit hyperactivity disorder)    Anxiety    Arthritis    BPH (benign prostatic hyperplasia)    Coronary artery disease    DDD (degenerative disc disease), cervical    a.) a/p ACDF C3-C7   DDD (degenerative disc disease), lumbar    a.) s/p laminectomy and fusion L3-L5; b.) s/p laminectomy L5-S1   DM (diabetes mellitus), type 2 (HCC)    Dyspnea    Environmental and seasonal allergies    Family history of adverse reaction to anesthesia    a.) PONV in 1st degree relative (mother)   GERD (gastroesophageal reflux disease)    History of kidney stones    History of prostatitis    HOH (hard of hearing); wears bilateral hearing aids    Hyperlipidemia    Hypertension    IDA (iron deficiency anemia)    Inguinal hernia    Leg pain, right    Lumbar stenosis with neurogenic claudication    Morbid obesity (HCC)    MRSA (methicillin resistant staph aureus) culture positive    Neck pain of over 3 months duration    Obesity    OSA on CPAP    Palpitations    Penile adhesions     Prostate cancer (HCC)    RA (rheumatoid arthritis) (HCC)    a.) followed by rheumatolofy; negative rheumatoid factor; Dx based on pattern of joint involvement, gelling phenomenon, and mildly prolonged morning stiffness   Thalassemia    Tick fever    Umbilical hernia       Past Surgical History:  Procedure Laterality Date   ANTERIOR CERVICAL DECOMP/DISCECTOMY FUSION N/A 11/13/2014   Procedure: Cervical three-four, Cervical six-seven anterior cervical decompression with fusion interbody prosthesis plating and bonegraft;  Surgeon: Reyes Budge, MD;  Location: [facility] OR;  Service: Neurosurgery;  Laterality: N/A;  Cervical three-four, Cervical six-seven anterior cervical decompression with fusion interbody prosthesis plating and bonegraft   CALCANEAL OSTEOTOMY Right 01/11/2017   Procedure: CALCANEAL OSTEOTOMY;  Surgeon: Kayla Pinal, MD;  Location: [facility] ORS;  Service: Orthopedics;  Laterality: Right;   CIRCUMCISION N/A 12/13/2017   Procedure: PENILE BLOCK, LYSIS OF PENILE ADHESIONS;  Surgeon: Penne Knee, MD;  Location: [facility] ORS;  Service: Urology;  Laterality: N/A;   COLONOSCOPY     COLONOSCOPY WITH PROPOFOL  N/A 09/15/2021   Procedure: COLONOSCOPY WITH PROPOFOL ;  Surgeon: Toledo, Ladell POUR, MD;  Location: [facility] ENDOSCOPY;  Service: Gastroenterology;  Laterality: N/A;  DM   CYSTOSCOPY N/A 12/13/2017   Procedure: CYSTOSCOPY;  Surgeon:  Penne Knee, MD;  Location: [facility] ORS;  Service: Urology;  Laterality: N/A;   CYSTOSCOPY N/A 04/16/2024   Procedure: CYSTOSCOPY;  Surgeon: Twylla Glendia BROCKS, MD;  Location: [facility] ORS;  Service: Urology;  Laterality: N/A;   DIRECT LARYNGOSCOPY N/A 11/24/2020   Procedure: MICRO DIRECT LARYNGOSCOPY W/BIOPSY;  Surgeon: Herminio Miu, MD;  Location: [facility] ORS;  Service: ENT;  Laterality: N/A;   HYDROCELE EXCISION / REPAIR     INGUINAL HERNIA REPAIR Left    LEG SURGERY Right    Had surgery on right calf X 7   LESION DESTRUCTION N/A 04/16/2024   Procedure:  DESTRUCTION, LESION, PENIS;  Surgeon: Twylla Glendia BROCKS, MD;  Location: [facility] ORS;  Service: Urology;  Laterality: N/A;   LITHOTRIPSY     LUMBAR LAMINECTOMY (L5-S1) N/A    LUMBAR LAMINECTOMY AND FUSION (L3-L5) N/A    LUMBAR WOUND DEBRIDEMENT N/A 11/28/2019   Procedure: LUMBAR WOUND DEBRIDEMENT;  Surgeon: Mavis Purchase, MD;  Location: [facility] OR;  Service: Neurosurgery;  Laterality: N/A;  LUMBAR WOUND DEBRIDEMENT   PENILE BIOPSY N/A 04/16/2024   Procedure: BIOPSY, PENIS;  Surgeon: Twylla Glendia BROCKS, MD;  Location: [facility] ORS;  Service: Urology;  Laterality: N/A;   QUADRICEPS TENDON REPAIR Left 06/22/2015   Procedure: REPAIR QUADRICEP TENDON;  Surgeon: Kayla Pinal, MD;  Location: [facility] ORS;  Service: Orthopedics;  Laterality: Left;   QUADRICEPS TENDON REPAIR Left 08/12/2015   Procedure: REPAIR QUADRICEP TENDON;  Surgeon: Kayla Pinal, MD;  Location: [facility] ORS;  Service: Orthopedics;  Laterality: Left;   ROTATOR CUFF REPAIR Left    TONSILLECTOMY     VASECTOMY      Allergies  Allergen Reactions   Tizanidine Nausea Only   Methocarbamol Other (See Comments)    Numbness, and change of taste    Vicodin [Hydrocodone -Acetaminophen ] Itching    Can take with benadryl      Current Outpatient Medications  Medication Sig Dispense Refill   amLODipine -benazepril  (LOTREL) 10-40 MG capsule Take 1 capsule by mouth daily. (Patient taking differently: Take 1 capsule by mouth every morning.) 90 capsule 3   aspirin EC 81 MG tablet Take 81 mg by mouth in the morning.     atorvastatin  (LIPITOR) 40 MG tablet Take 1 tablet (40 mg total) by mouth daily. (Patient taking differently: Take 40 mg by mouth every morning.) 90 tablet 3   betamethasone  dipropionate 0.05 % cream Apply topically 2 (two) times daily. (Patient taking differently: Apply 1 Application topically daily as needed (skin irritation.).) 30 g 0   cetirizine  (ZYRTEC ) 10 MG tablet Take 1 tablet (10 mg total) by mouth daily. Take one tablet daily (Patient taking  differently: Take 10 mg by mouth See admin instructions. Take 1 tablet (10 mg) by mouth scheduled every morning, may take an additional dose (10 mg) in the evening, if needed for allergies.) 90 tablet 3   cyclobenzaprine  (FLEXERIL ) 10 MG tablet Take 1 tablet (10 mg total) by mouth at bedtime. 50 tablet 1   dapagliflozin  propanediol (FARXIGA ) 10 MG TABS tablet TAKE 1 TABLET BY MOUTH EVERY DAY BEFORE BREAKFAST 90 tablet 3   finasteride  (PROSCAR ) 5 MG tablet TAKE 1 TABLET (5 MG TOTAL) BY MOUTH DAILY. (Patient taking differently: Take 5 mg by mouth every morning.) 90 tablet 3   fluconazole  (DIFLUCAN ) 100 MG tablet Take 1 tablet (100 mg total) by mouth daily. X 7 days 7 tablet 0   fluticasone  (FLONASE ) 50 MCG/ACT nasal spray USE TWO SPRAYS IN EACH NOSTRIL EVERY DAY 48 mL 4  hydrochlorothiazide  (HYDRODIURIL ) 25 MG tablet Take 1 tablet (25 mg total) by mouth daily. (Patient taking differently: Take 25 mg by mouth every morning.) 90 tablet 1   meloxicam  (MOBIC ) 15 MG tablet Take 15 mg by mouth in the morning.     metoprolol  succinate (TOPROL -XL) 50 MG 24 hr tablet Take 1 tablet (50 mg total) by mouth daily. TAKE WITH OR IMMEDIATELY FOLLOWING A MEAL. (Patient taking differently: Take 50 mg by mouth every morning. TAKE WITH OR IMMEDIATELY FOLLOWING A MEAL.) 90 tablet 1   montelukast  (SINGULAIR ) 10 MG tablet Take 1 tablet (10 mg total) by mouth at bedtime. 90 tablet 3   Multiple Vitamin (MULTIVITAMIN WITH MINERALS) TABS tablet Take 1 tablet by mouth in the morning.     mupirocin  ointment (BACTROBAN ) 2 % Apply 1 Application topically 2 (two) times daily. 22 g 0   NON FORMULARY Pt uses a cpap nightly     nystatin  ointment (MYCOSTATIN ) Apply 1 Application topically 2 (two) times daily. 30 g 0   omeprazole (PRILOSEC) 20 MG capsule Take 20 mg by mouth every morning. Take 1 capsule (20 mg) by mouth scheduled every morning, may take an additional dose (20 mg) in the evening, if needed for indigestion/heartburn.      Semaglutide , 2 MG/DOSE, 8 MG/3ML SOPN Inject 2 mg as directed once a week. (Patient taking differently: Inject 2 mg into the skin every Friday.) 9 mL 1   sertraline  (ZOLOFT ) 50 MG tablet TAKE 1 TABLET BY MOUTH EVERY DAY (Patient taking differently: Take 50 mg by mouth every morning.) 90 tablet 3   tamsulosin  (FLOMAX ) 0.4 MG CAPS capsule TAKE 2 CAPSULES BY MOUTH EVERY DAY (Patient taking differently: Take 0.8 mg by mouth daily before breakfast. TAKE 2 CAPSULES BY MOUTH EVERY DAY) 180 capsule 3   No current facility-administered medications for this visit.    Family History Family History  Problem Relation Age of Onset   Prostate cancer Father    Heart disease Father    Diabetes Father    Alzheimer's disease Father    Anemia Mother    Osteoporosis Mother    Healthy Sister    Diabetes Brother    Hypertension Brother    Anemia Daughter    Diabetes Brother    Kidney disease Neg Hx    Bladder Cancer Neg Hx    Colon cancer Neg Hx       Social History Social History   Tobacco Use   Smoking status: Never    Passive exposure: Never   Smokeless tobacco: Never  Vaping Use   Vaping status: Never Used  Substance Use Topics   Alcohol use: No   Drug use: No        Review of Systems  Constitutional: Negative.   HENT:  Positive for hearing loss.   Eyes: Negative.   Respiratory: Negative.    Cardiovascular: Negative.   Gastrointestinal:  Positive for abdominal pain (At the umbilical site).  Genitourinary: Negative.   Skin: Negative.   Neurological: Negative.   Psychiatric/Behavioral: Negative.       Physical Exam Blood pressure 139/85, pulse 88, temperature 98.1 F (36.7 C), temperature source Oral, height 5' 6 (1.676 m), weight 237 lb (107.5 kg), SpO2 95%. Last Weight  Most recent update: 06/04/2024  9:54 AM    Weight  107.5 kg (237 lb)             CONSTITUTIONAL: Well developed, and nourished, appropriately responsive and aware without distress.   EYES:  Sclera  non-icteric.   EARS, NOSE, MOUTH AND THROAT:  The oropharynx is clear. Oral mucosa is pink and moist.    Hearing is intact to voice.  NECK: Trachea is midline, and there is no jugular venous distension.  LYMPH NODES:  Lymph nodes in the neck are not appreciated. RESPIRATORY:  Lungs are clear, and breath sounds are equal bilaterally.  Normal respiratory effort without pathologic use of accessory muscles. CARDIOVASCULAR: Heart is regular in rate and rhythm.   Well perfused.  GI: The abdomen is notable for diastases recti, there is minimal bulge he can demonstrate today, however I believe I can appreciate a 1 to 2 cm at most fascial defect just to the right of his umbilical dermal fascial junction.  Otherwise soft, nontender, and nondistended. There were no palpable masses.  I did not appreciate hepatosplenomegaly.  MUSCULOSKELETAL:  Symmetrical muscle tone appreciated in all four extremities.    SKIN: Skin turgor is normal. No pathologic skin lesions appreciated.  NEUROLOGIC:  Motor and sensation appear grossly normal.  Cranial nerves are grossly without defect. PSYCH:  Alert and oriented to person, place and time. Affect is appropriate for situation.  Data Reviewed I have personally reviewed what is currently available of the patient's imaging, recent labs and medical records.   Labs:     Latest Ref Rng & Units 04/11/2024    8:37 AM 08/28/2023   11:29 AM 07/25/2023    9:01 AM  CBC  WBC 4.0 - 10.5 K/uL 4.5  5.2  5.9   Hemoglobin 13.0 - 17.0 g/dL 88.6  88.9  89.0   Hematocrit 39.0 - 52.0 % 35.1  35.4  35.3   Platelets 150 - 400 K/uL 172  160  183       Latest Ref Rng & Units 04/05/2024    8:42 AM 01/04/2024    9:13 AM 07/25/2023    9:01 AM  CMP  Glucose 70 - 99 mg/dL 897  895  853   BUN 8 - 27 mg/dL 13  15  18    Creatinine 0.76 - 1.27 mg/dL 9.06  9.15  9.19   Sodium 134 - 144 mmol/L 138  139  138   Potassium 3.5 - 5.2 mmol/L 3.5  4.2  4.2   Chloride 96 - 106 mmol/L 98  98  99   CO2 20 -  29 mmol/L 23  28  21    Calcium  8.6 - 10.2 mg/dL 9.8  9.8  9.4   Total Protein 6.0 - 8.5 g/dL 7.7  7.8  7.3   Total Bilirubin 0.0 - 1.2 mg/dL 0.9  0.8  0.6   Alkaline Phos 44 - 121 IU/L 74  71  63   AST 0 - 40 IU/L 40  34  41   ALT 0 - 44 IU/L 39  44  47     Imaging: Radiological images reviewed:   Within last 24 hrs: No results found.  Assessment    Umbilical hernia, reducible, less than 3 cm fascial defect. Patient Active Problem List   Diagnosis Date Noted   Arthritis 04/05/2024   Acute eczematoid otitis externa of left ear 11/07/2023   Elevated liver enzymes 07/25/2023   Hyperlipidemia associated with type 2 diabetes mellitus (HCC) 07/25/2023   Acute gastritis without hemorrhage 07/25/2023   Morbid obesity (HCC) 07/25/2023   Swelling of multiple joints 07/06/2023   Morning joint stiffness of multiple sites 07/06/2023   Acute left otitis media 07/06/2023   GAD (generalized anxiety  disorder) 12/27/2022   Spondylolisthesis of lumbosacral region 10/12/2022   Type 2 diabetes mellitus with complication (HCC) 10/19/2020   Postoperative wound infection 11/28/2019   Lumbar stenosis with neurogenic claudication 10/23/2019   Precordial pain 06/20/2019   Trochanteric bursitis of right hip 11/01/2018   Prostate cancer (HCC) 08/09/2018   Umbilical hernia without obstruction and without gangrene 03/06/2018   Nephrolithiasis 01/15/2017   OSA (obstructive sleep apnea) 11/08/2016   ADD (attention deficit disorder) 09/08/2015   Absolute anemia 09/08/2015   Achilles bursitis 09/08/2015   Back pain, thoracic 09/08/2015   Acid reflux 09/08/2015   Bulge of cervical disc without myelopathy 09/08/2015   H/O 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  I discussed possibility of incarceration, strangulation, enlargement in size over time, and the need for emergency surgery in the face of these.  Also reviewed the techniques of reduction should incarceration occur, and when unsuccessful to present to the ED.  Also discussed that surgery risks include recurrence which can be up to 30% in the case of complex hernias, use of prosthetic materials (mesh) and the increased risk of infection and the possible need for re-operation and removal of mesh, possibility of post-op SBO or ileus, and the risks of general anesthetic including heart attack, stroke, sudden death or some reaction to anesthetic medications. The patient, and those present, appear to understand the risks, any and all questions were answered to the patient's satisfaction.  No guarantees were ever expressed or implied.   I personally spent a total of 30 minutes in the care of the patient today including preparing to see the patient, performing a medically appropriate exam/evaluation, counseling and educating, placing orders, and documenting clinical information in the EHR.   These notes generated with voice recognition software. I apologize for typographical errors.  Honor Leghorn M.D., FACS 06/04/2024, 10:30 AM

## 2024-06-03 NOTE — H&P (View-Only) (Signed)
 Patient ID: Justin Soto, male   DOB: October 25, 1961, 63 y.o.   MRN: [id number]  Chief Complaint: Umbilical bulge,  History of Present Illness Justin Soto is a 63 y.o. male with a self diagnosed umbilical hernia, progressively present for the last month or more.  He has lost weight, and has been doing a lot of walking.  He is retired and does not utilize a lot of heavy lifting.  He does note as the day wears on with increased activity that the bulge becomes more prominent and has become progressively more tender.  He has a known diastases recti which is asymptomatic.  He denies any bowel issues, denies nausea, vomiting, fevers and chills.  No prior abdominal surgery aside from an inguinal hernia repair.  He has been having weekly Ozempic  injections to assist with his weight loss.  He is well aware of needing to withhold this prior to surgery.  Past Medical History Past Medical History:  Diagnosis Date   ADHD (attention deficit hyperactivity disorder)    Anxiety    Arthritis    BPH (benign prostatic hyperplasia)    Coronary artery disease    DDD (degenerative disc disease), cervical    a.) a/p ACDF C3-C7   DDD (degenerative disc disease), lumbar    a.) s/p laminectomy and fusion L3-L5; b.) s/p laminectomy L5-S1   DM (diabetes mellitus), type 2 (HCC)    Dyspnea    Environmental and seasonal allergies    Family history of adverse reaction to anesthesia    a.) PONV in 1st degree relative (mother)   GERD (gastroesophageal reflux disease)    History of kidney stones    History of prostatitis    HOH (hard of hearing); wears bilateral hearing aids    Hyperlipidemia    Hypertension    IDA (iron deficiency anemia)    Inguinal hernia    Leg pain, right    Lumbar stenosis with neurogenic claudication    Morbid obesity (HCC)    MRSA (methicillin resistant staph aureus) culture positive    Neck pain of over 3 months duration    Obesity    OSA on CPAP    Palpitations    Penile adhesions     Prostate cancer (HCC)    RA (rheumatoid arthritis) (HCC)    a.) followed by rheumatolofy; negative rheumatoid factor; Dx based on pattern of joint involvement, gelling phenomenon, and mildly prolonged morning stiffness   Thalassemia    Tick fever    Umbilical hernia       Past Surgical History:  Procedure Laterality Date   ANTERIOR CERVICAL DECOMP/DISCECTOMY FUSION N/A 11/13/2014   Procedure: Cervical three-four, Cervical six-seven anterior cervical decompression with fusion interbody prosthesis plating and bonegraft;  Surgeon: Reyes Budge, MD;  Location: [facility] OR;  Service: Neurosurgery;  Laterality: N/A;  Cervical three-four, Cervical six-seven anterior cervical decompression with fusion interbody prosthesis plating and bonegraft   CALCANEAL OSTEOTOMY Right 01/11/2017   Procedure: CALCANEAL OSTEOTOMY;  Surgeon: Kayla Pinal, MD;  Location: [facility] ORS;  Service: Orthopedics;  Laterality: Right;   CIRCUMCISION N/A 12/13/2017   Procedure: PENILE BLOCK, LYSIS OF PENILE ADHESIONS;  Surgeon: Penne Knee, MD;  Location: [facility] ORS;  Service: Urology;  Laterality: N/A;   COLONOSCOPY     COLONOSCOPY WITH PROPOFOL  N/A 09/15/2021   Procedure: COLONOSCOPY WITH PROPOFOL ;  Surgeon: Toledo, Ladell POUR, MD;  Location: [facility] ENDOSCOPY;  Service: Gastroenterology;  Laterality: N/A;  DM   CYSTOSCOPY N/A 12/13/2017   Procedure: CYSTOSCOPY;  Surgeon:  Penne Knee, MD;  Location: [facility] ORS;  Service: Urology;  Laterality: N/A;   CYSTOSCOPY N/A 04/16/2024   Procedure: CYSTOSCOPY;  Surgeon: Twylla Glendia BROCKS, MD;  Location: [facility] ORS;  Service: Urology;  Laterality: N/A;   DIRECT LARYNGOSCOPY N/A 11/24/2020   Procedure: MICRO DIRECT LARYNGOSCOPY W/BIOPSY;  Surgeon: Herminio Miu, MD;  Location: [facility] ORS;  Service: ENT;  Laterality: N/A;   HYDROCELE EXCISION / REPAIR     INGUINAL HERNIA REPAIR Left    LEG SURGERY Right    Had surgery on right calf X 7   LESION DESTRUCTION N/A 04/16/2024   Procedure:  DESTRUCTION, LESION, PENIS;  Surgeon: Twylla Glendia BROCKS, MD;  Location: [facility] ORS;  Service: Urology;  Laterality: N/A;   LITHOTRIPSY     LUMBAR LAMINECTOMY (L5-S1) N/A    LUMBAR LAMINECTOMY AND FUSION (L3-L5) N/A    LUMBAR WOUND DEBRIDEMENT N/A 11/28/2019   Procedure: LUMBAR WOUND DEBRIDEMENT;  Surgeon: Mavis Purchase, MD;  Location: [facility] OR;  Service: Neurosurgery;  Laterality: N/A;  LUMBAR WOUND DEBRIDEMENT   PENILE BIOPSY N/A 04/16/2024   Procedure: BIOPSY, PENIS;  Surgeon: Twylla Glendia BROCKS, MD;  Location: [facility] ORS;  Service: Urology;  Laterality: N/A;   QUADRICEPS TENDON REPAIR Left 06/22/2015   Procedure: REPAIR QUADRICEP TENDON;  Surgeon: Kayla Pinal, MD;  Location: [facility] ORS;  Service: Orthopedics;  Laterality: Left;   QUADRICEPS TENDON REPAIR Left 08/12/2015   Procedure: REPAIR QUADRICEP TENDON;  Surgeon: Kayla Pinal, MD;  Location: [facility] ORS;  Service: Orthopedics;  Laterality: Left;   ROTATOR CUFF REPAIR Left    TONSILLECTOMY     VASECTOMY      Allergies  Allergen Reactions   Tizanidine Nausea Only   Methocarbamol Other (See Comments)    Numbness, and change of taste    Vicodin [Hydrocodone -Acetaminophen ] Itching    Can take with benadryl      Current Outpatient Medications  Medication Sig Dispense Refill   amLODipine -benazepril  (LOTREL) 10-40 MG capsule Take 1 capsule by mouth daily. (Patient taking differently: Take 1 capsule by mouth every morning.) 90 capsule 3   aspirin EC 81 MG tablet Take 81 mg by mouth in the morning.     atorvastatin  (LIPITOR) 40 MG tablet Take 1 tablet (40 mg total) by mouth daily. (Patient taking differently: Take 40 mg by mouth every morning.) 90 tablet 3   betamethasone  dipropionate 0.05 % cream Apply topically 2 (two) times daily. (Patient taking differently: Apply 1 Application topically daily as needed (skin irritation.).) 30 g 0   cetirizine  (ZYRTEC ) 10 MG tablet Take 1 tablet (10 mg total) by mouth daily. Take one tablet daily (Patient taking  differently: Take 10 mg by mouth See admin instructions. Take 1 tablet (10 mg) by mouth scheduled every morning, may take an additional dose (10 mg) in the evening, if needed for allergies.) 90 tablet 3   cyclobenzaprine  (FLEXERIL ) 10 MG tablet Take 1 tablet (10 mg total) by mouth at bedtime. 50 tablet 1   dapagliflozin  propanediol (FARXIGA ) 10 MG TABS tablet TAKE 1 TABLET BY MOUTH EVERY DAY BEFORE BREAKFAST 90 tablet 3   finasteride  (PROSCAR ) 5 MG tablet TAKE 1 TABLET (5 MG TOTAL) BY MOUTH DAILY. (Patient taking differently: Take 5 mg by mouth every morning.) 90 tablet 3   fluconazole  (DIFLUCAN ) 100 MG tablet Take 1 tablet (100 mg total) by mouth daily. X 7 days 7 tablet 0   fluticasone  (FLONASE ) 50 MCG/ACT nasal spray USE TWO SPRAYS IN EACH NOSTRIL EVERY DAY 48 mL 4  hydrochlorothiazide  (HYDRODIURIL ) 25 MG tablet Take 1 tablet (25 mg total) by mouth daily. (Patient taking differently: Take 25 mg by mouth every morning.) 90 tablet 1   meloxicam  (MOBIC ) 15 MG tablet Take 15 mg by mouth in the morning.     metoprolol  succinate (TOPROL -XL) 50 MG 24 hr tablet Take 1 tablet (50 mg total) by mouth daily. TAKE WITH OR IMMEDIATELY FOLLOWING A MEAL. (Patient taking differently: Take 50 mg by mouth every morning. TAKE WITH OR IMMEDIATELY FOLLOWING A MEAL.) 90 tablet 1   montelukast  (SINGULAIR ) 10 MG tablet Take 1 tablet (10 mg total) by mouth at bedtime. 90 tablet 3   Multiple Vitamin (MULTIVITAMIN WITH MINERALS) TABS tablet Take 1 tablet by mouth in the morning.     mupirocin  ointment (BACTROBAN ) 2 % Apply 1 Application topically 2 (two) times daily. 22 g 0   NON FORMULARY Pt uses a cpap nightly     nystatin  ointment (MYCOSTATIN ) Apply 1 Application topically 2 (two) times daily. 30 g 0   omeprazole (PRILOSEC) 20 MG capsule Take 20 mg by mouth every morning. Take 1 capsule (20 mg) by mouth scheduled every morning, may take an additional dose (20 mg) in the evening, if needed for indigestion/heartburn.      Semaglutide , 2 MG/DOSE, 8 MG/3ML SOPN Inject 2 mg as directed once a week. (Patient taking differently: Inject 2 mg into the skin every Friday.) 9 mL 1   sertraline  (ZOLOFT ) 50 MG tablet TAKE 1 TABLET BY MOUTH EVERY DAY (Patient taking differently: Take 50 mg by mouth every morning.) 90 tablet 3   tamsulosin  (FLOMAX ) 0.4 MG CAPS capsule TAKE 2 CAPSULES BY MOUTH EVERY DAY (Patient taking differently: Take 0.8 mg by mouth daily before breakfast. TAKE 2 CAPSULES BY MOUTH EVERY DAY) 180 capsule 3   No current facility-administered medications for this visit.    Family History Family History  Problem Relation Age of Onset   Prostate cancer Father    Heart disease Father    Diabetes Father    Alzheimer's disease Father    Anemia Mother    Osteoporosis Mother    Healthy Sister    Diabetes Brother    Hypertension Brother    Anemia Daughter    Diabetes Brother    Kidney disease Neg Hx    Bladder Cancer Neg Hx    Colon cancer Neg Hx       Social History Social History   Tobacco Use   Smoking status: Never    Passive exposure: Never   Smokeless tobacco: Never  Vaping Use   Vaping status: Never Used  Substance Use Topics   Alcohol use: No   Drug use: No        Review of Systems  Constitutional: Negative.   HENT:  Positive for hearing loss.   Eyes: Negative.   Respiratory: Negative.    Cardiovascular: Negative.   Gastrointestinal:  Positive for abdominal pain (At the umbilical site).  Genitourinary: Negative.   Skin: Negative.   Neurological: Negative.   Psychiatric/Behavioral: Negative.       Physical Exam Blood pressure 139/85, pulse 88, temperature 98.1 F (36.7 C), temperature source Oral, height 5' 6 (1.676 m), weight 237 lb (107.5 kg), SpO2 95%. Last Weight  Most recent update: 06/04/2024  9:54 AM    Weight  107.5 kg (237 lb)             CONSTITUTIONAL: Well developed, and nourished, appropriately responsive and aware without distress.   EYES:  Sclera  non-icteric.   EARS, NOSE, MOUTH AND THROAT:  The oropharynx is clear. Oral mucosa is pink and moist.    Hearing is intact to voice.  NECK: Trachea is midline, and there is no jugular venous distension.  LYMPH NODES:  Lymph nodes in the neck are not appreciated. RESPIRATORY:  Lungs are clear, and breath sounds are equal bilaterally.  Normal respiratory effort without pathologic use of accessory muscles. CARDIOVASCULAR: Heart is regular in rate and rhythm.   Well perfused.  GI: The abdomen is notable for diastases recti, there is minimal bulge he can demonstrate today, however I believe I can appreciate a 1 to 2 cm at most fascial defect just to the right of his umbilical dermal fascial junction.  Otherwise soft, nontender, and nondistended. There were no palpable masses.  I did not appreciate hepatosplenomegaly.  MUSCULOSKELETAL:  Symmetrical muscle tone appreciated in all four extremities.    SKIN: Skin turgor is normal. No pathologic skin lesions appreciated.  NEUROLOGIC:  Motor and sensation appear grossly normal.  Cranial nerves are grossly without defect. PSYCH:  Alert and oriented to person, place and time. Affect is appropriate for situation.  Data Reviewed I have personally reviewed what is currently available of the patient's imaging, recent labs and medical records.   Labs:     Latest Ref Rng & Units 04/11/2024    8:37 AM 08/28/2023   11:29 AM 07/25/2023    9:01 AM  CBC  WBC 4.0 - 10.5 K/uL 4.5  5.2  5.9   Hemoglobin 13.0 - 17.0 g/dL 88.6  88.9  89.0   Hematocrit 39.0 - 52.0 % 35.1  35.4  35.3   Platelets 150 - 400 K/uL 172  160  183       Latest Ref Rng & Units 04/05/2024    8:42 AM 01/04/2024    9:13 AM 07/25/2023    9:01 AM  CMP  Glucose 70 - 99 mg/dL 897  895  853   BUN 8 - 27 mg/dL 13  15  18    Creatinine 0.76 - 1.27 mg/dL 9.06  9.15  9.19   Sodium 134 - 144 mmol/L 138  139  138   Potassium 3.5 - 5.2 mmol/L 3.5  4.2  4.2   Chloride 96 - 106 mmol/L 98  98  99   CO2 20 -  29 mmol/L 23  28  21    Calcium  8.6 - 10.2 mg/dL 9.8  9.8  9.4   Total Protein 6.0 - 8.5 g/dL 7.7  7.8  7.3   Total Bilirubin 0.0 - 1.2 mg/dL 0.9  0.8  0.6   Alkaline Phos 44 - 121 IU/L 74  71  63   AST 0 - 40 IU/L 40  34  41   ALT 0 - 44 IU/L 39  44  47     Imaging: Radiological images reviewed:   Within last 24 hrs: No results found.  Assessment    Umbilical hernia, reducible, less than 3 cm fascial defect. Patient Active Problem List   Diagnosis Date Noted   Arthritis 04/05/2024   Acute eczematoid otitis externa of left ear 11/07/2023   Elevated liver enzymes 07/25/2023   Hyperlipidemia associated with type 2 diabetes mellitus (HCC) 07/25/2023   Acute gastritis without hemorrhage 07/25/2023   Morbid obesity (HCC) 07/25/2023   Swelling of multiple joints 07/06/2023   Morning joint stiffness of multiple sites 07/06/2023   Acute left otitis media 07/06/2023   GAD (generalized anxiety  disorder) 12/27/2022   Spondylolisthesis of lumbosacral region 10/12/2022   Type 2 diabetes mellitus with complication (HCC) 10/19/2020   Postoperative wound infection 11/28/2019   Lumbar stenosis with neurogenic claudication 10/23/2019   Precordial pain 06/20/2019   Trochanteric bursitis of right hip 11/01/2018   Prostate cancer (HCC) 08/09/2018   Umbilical hernia without obstruction and without gangrene 03/06/2018   Nephrolithiasis 01/15/2017   OSA (obstructive sleep apnea) 11/08/2016   ADD (attention deficit disorder) 09/08/2015   Absolute anemia 09/08/2015   Achilles bursitis 09/08/2015   Back pain, thoracic 09/08/2015   Acid reflux 09/08/2015   Bulge of cervical disc without myelopathy 09/08/2015   H/O 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  I discussed possibility of incarceration, strangulation, enlargement in size over time, and the need for emergency surgery in the face of these.  Also reviewed the techniques of reduction should incarceration occur, and when unsuccessful to present to the ED.  Also discussed that surgery risks include recurrence which can be up to 30% in the case of complex hernias, use of prosthetic materials (mesh) and the increased risk of infection and the possible need for re-operation and removal of mesh, possibility of post-op SBO or ileus, and the risks of general anesthetic including heart attack, stroke, sudden death or some reaction to anesthetic medications. The patient, and those present, appear to understand the risks, any and all questions were answered to the patient's satisfaction.  No guarantees were ever expressed or implied.   I personally spent a total of 30 minutes in the care of the patient today including preparing to see the patient, performing a medically appropriate exam/evaluation, counseling and educating, placing orders, and documenting clinical information in the EHR.   These notes generated with voice recognition software. I apologize for typographical errors.  Honor Leghorn M.D., FACS 06/04/2024, 10:30 AM

## 2024-06-04 ENCOUNTER — Encounter: Payer: Self-pay | Admitting: Surgery

## 2024-06-04 ENCOUNTER — Ambulatory Visit: Payer: Self-pay | Admitting: Surgery

## 2024-06-04 ENCOUNTER — Ambulatory Visit: Admitting: Surgery

## 2024-06-04 VITALS — BP 139/85 | HR 88 | Temp 98.1°F | Ht 66.0 in | Wt 237.0 lb

## 2024-06-04 DIAGNOSIS — K429 Umbilical hernia without obstruction or gangrene: Secondary | ICD-10-CM

## 2024-06-04 NOTE — Patient Instructions (Addendum)
 You have requested for your Umbilical Hernia be repaired. This will be scheduled with Dr. Lane at [facility].   If you are on any injectable weight loss medication, you will need to stop taking your GLP-1 injectable (weight loss) medications 8 days before your surgery to avoid any complications with anesthesia.   Please see your (blue)pre-care sheet for information. Our surgery scheduler will call you to verify surgery date and to go over information.   You will need to arrange to be off work for 1-2 weeks but will have to have a lifting restriction of no more than 15 lbs for 6 weeks following your surgery. If you have FMLA or disability paperwork that needs filled out you may drop this off at our office or this can be faxed to [phone].     Umbilical Hernia, Adult A hernia is a bulge of tissue that pushes through an opening between muscles. An umbilical hernia happens in the abdomen, near the belly button (umbilicus). The hernia may contain tissues from the small intestine, large intestine, or fatty tissue covering the intestines (omentum). Umbilical hernias in adults tend to get worse over time, and they require surgical treatment. There are several types of umbilical hernias. You may have: A hernia located just above or below the umbilicus (indirect hernia). This is the most common type of umbilical hernia in adults. A hernia that forms through an opening formed by the umbilicus (direct hernia). A hernia that comes and goes (reducible hernia). A reducible hernia may be visible only when you strain, lift something heavy, or cough. This type of hernia can be pushed back into the abdomen (reduced). A hernia that traps abdominal tissue inside the hernia (incarcerated hernia). This type of hernia cannot be reduced. A hernia that cuts off blood flow to the tissues inside the hernia (strangulated hernia). The tissues can start to die if this happens. This type of  hernia requires emergency treatment.  What are the causes? An umbilical hernia happens when tissue inside the abdomen presses on a weak area of the abdominal muscles. What increases the risk? You may have a greater risk of this condition if you: Are obese. Have had several pregnancies. Have a buildup of fluid inside your abdomen (ascites). Have had surgery that weakens the abdominal muscles.  What are the signs or symptoms? The main symptom of this condition is a painless bulge at or near the belly button. A reducible hernia may be visible only when you strain, lift something heavy, or cough. Other symptoms may include: Dull pain. A feeling of pressure.  Symptoms of a strangulated hernia may include: Pain that gets increasingly worse. Nausea and vomiting. Pain when pressing on the hernia. Skin over the hernia becoming red or purple. Constipation. Blood in the stool.  How is this diagnosed? This condition may be diagnosed based on: A physical exam. You may be asked to cough or strain while standing. These actions increase the pressure inside your abdomen and force the hernia through the opening in your muscles. Your health care provider may try to reduce the hernia by pressing on it. Your symptoms and medical history.  How is this treated? Surgery is the only treatment for an umbilical hernia. Surgery for a strangulated hernia is done as soon as possible. If you have a small hernia that is not incarcerated, you may need to lose weight before having surgery. Follow these instructions at home: Lose weight, if told by your health care provider.  Do not try to push the hernia back in. Watch your hernia for any changes in color or size. Tell your health care provider if any changes occur. You may need to avoid activities that increase pressure on your hernia. Do not lift anything that is heavier than 10 lb (4.5 kg) until your health care provider says that this is safe. Take  over-the-counter and prescription medicines only as told by your health care provider. Keep all follow-up visits as told by your health care provider. This is important. Contact a health care provider if: Your hernia gets larger. Your hernia becomes painful. Get help right away if: You develop sudden, severe pain near the area of your hernia. You have pain as well as nausea or vomiting. You have pain and the skin over your hernia changes color. You develop a fever. This information is not intended to replace advice given to you by your health care provider. Make sure you discuss any questions you have with your health care provider. Document Released: 04/29/2016 Document Revised: 07/31/2016 Document Reviewed: 04/29/2016 Elsevier Interactive Patient Education  2018 [employer]     Diastasis Recti  Diastasis recti is a condition in which the muscles of the abdomen (rectus abdominis muscles) become thin and separate. The result is a wider space between the muscles of the right and left abdomen (abdominal muscles). This wider space between the muscles may cause a bulge in the middle of the abdomen. This bulge may be noticed when a person is straining or when he or she sits up after lying down. Diastasis recti can affect men and women. It is most common among pregnant women, babies, people with obesity, and people who have had abdominal surgery. Exercise or surgery may help correct this condition. What are the causes? Common causes of this condition include: Pregnancy. As the uterus grows in size, it puts pressure on the abdominal muscles, causing the muscles to separate. Obesity. Excess fat puts pressure on abdominal muscles. Weight lifting. Some exercises of the abdomen. Advanced age. Genetics. Having had surgery on the abdomen before. What increases the risk? This condition is more likely to develop in: Women. Newborns, especially newborns who are born early (prematurely). What are  the signs or symptoms? Common symptoms of this condition include: A bulge in the middle of your abdomen. You will notice it most when you sit up or strain. Pain in your low back, hips, or the area between your hip bones (pelvis). Constipation. Being unable to control when you urinate (urinary incontinence). Bloating. Poor posture. How is this diagnosed? This condition is diagnosed with a physical exam. During the exam, your health care provider will ask you to lie flat on your back and do a crunch or half sit-up. If you have diastasis recti, a bulge will appear lengthwise between your abdominal muscles in the center of your abdomen. Your health care provider will measure the gap between your muscles with one of the following: A medical device used to measure the space between two objects (caliper). A tape measure. CT scan. Ultrasound. Finger spaces. Your health care provider will measure the space using his or her fingers. How is this treated? If your muscle separation is not too large, you may not need treatment. However, if you are a woman who plans to become pregnant again, you should treat this condition before your next pregnancy. Treatment may include: Physical therapy exercises to strengthen and tighten your abdominal muscles. Lifestyle changes such as weight loss and exercise. Over-the-counter  pain medicines as needed. Surgery to correct the separation. Follow these instructions at home: Activity Return to your normal activities as told by your health care provider. Ask your health care provider what activities are safe for you. Do exercises as told by your health care provider. Make sure you are doing your exercises and movements correctly when lifting weights or doing exercises using your abdominal muscles or the muscles in the center of your body that give stability (core muscles). Proper form can help to prevent this condition from happening again. General instructions If you  are overweight, ask your health care provider for help with weight loss. Losing even a small amount of weight can help to improve your diastasis recti. Take over-the-counter or prescription medicines only as told by your health care provider. Do not strain. Straining can make the separation worse. Examples of straining include: Pushing hard to have a bowel movement, such as when you have constipation. Lifting heavy objects or lifting children. Standing up and sitting down. You may need to take these actions to prevent or treat constipation: Drink enough fluid to keep your urine pale yellow. Take over-the-counter or prescription medicines. Eat foods that are high in fiber, such as beans, whole grains, and fresh fruits and vegetables. Limit foods that are high in fat and processed sugars, such as fried or sweet foods. Keep all follow-up visits. This is important. Contact a health care provider if: You notice a new bulge in your abdomen. Get help right away if: You experience severe discomfort in your abdomen. You develop severe abdominal pain along with nausea, vomiting, or a fever. Summary Diastasis recti is a condition in which the muscles of the abdomen (rectus abdominismuscles) become thin and separate. You may notice a bulge in your abdomen because the space has widened between the muscles of the right and left abdomen. The most common symptom is a bulge in the middle of your abdomen. You will notice it most when you sit up or strain. This condition is diagnosed with a physical exam. If the muscle separation is not too big, you may not need treatment. Otherwise, you may need to do physical therapy or have surgery. This information is not intended to replace advice given to you by your health care provider. Make sure you discuss any questions you have with your health care provider. Document Revised: 08/01/2023 Document Reviewed: 08/01/2023 Elsevier Patient Education  2024 [employer].

## 2024-06-05 ENCOUNTER — Telehealth: Payer: Self-pay | Admitting: Surgery

## 2024-06-05 NOTE — Telephone Encounter (Signed)
 Patient has been advised of Pre-Admission date/time, and Surgery date at [facility].  Surgery Date: 06/19/24 Preadmission Testing Date: 06/11/24 (phone 1p-4p)  Patient informed of the scheduling process and surgery information given at time of office visit.   Patient has been made aware to call [phone], between 1-3:00pm the day before surgery, to find out what time to arrive for surgery.

## 2024-06-11 ENCOUNTER — Encounter
Admission: RE | Admit: 2024-06-11 | Discharge: 2024-06-11 | Disposition: A | Discharge status: Home / Self Care | Source: Ambulatory Visit | Attending: Surgery | Admitting: Surgery

## 2024-06-11 ENCOUNTER — Other Ambulatory Visit: Payer: Self-pay | Admitting: Urology

## 2024-06-11 ENCOUNTER — Other Ambulatory Visit: Payer: Self-pay

## 2024-06-11 DIAGNOSIS — E119 Type 2 diabetes mellitus without complications: Secondary | ICD-10-CM

## 2024-06-11 DIAGNOSIS — Z01812 Encounter for preprocedural laboratory examination: Secondary | ICD-10-CM

## 2024-06-11 DIAGNOSIS — N401 Enlarged prostate with lower urinary tract symptoms: Secondary | ICD-10-CM

## 2024-06-11 HISTORY — DX: Pneumonia, unspecified organism: J18.9

## 2024-06-11 NOTE — Patient Instructions (Addendum)
 Your procedure is scheduled on: 06/19/24 - Wednesday Report to the Registration Desk on the 1st floor of the [facility]. To find out your arrival time, please call [phone] between 1PM - 3PM on: 06/18/24 - Tuesday If your arrival time is 6:00 am, do not arrive before that time as the [facility] entrance doors do not open until 6:00 am.  REMEMBER: Instructions that are not followed completely may result in serious medical risk, up to and including death; or upon the discretion of your surgeon and anesthesiologist your surgery may need to be rescheduled.  Do not eat food or drink any liquids after midnight the night before surgery.  No gum chewing or hard candies.  One week prior to surgery: Stop Anti-inflammatories (NSAIDS) such as meloxicam  (MOBIC ) , Advil , Aleve, Ibuprofen , Motrin , Naproxen, Naprosyn and Aspirin based products such as Excedrin, Goody's Powder, BC Powder. You may take Tylenol  if needed for pain up until the day of surgery.  Stop ANY OVER THE COUNTER supplements until after surgery : Multivitamin  HOLD FARXIGA  beginning 06/16/24, may resume after surgery.  HOLD Semaglutide  7 days prior to surgery, may resume after surgery.  HOLD ASPIRIN 5 days prior to your surgery.   ON THE DAY OF SURGERY ONLY TAKE THESE MEDICATIONS WITH SIPS OF WATER :  atorvastatin  (LIPITOR)  sertraline  (ZOLOFT )  metoprolol  succinate (TOPROL )  tamsulosin  (FLOMAX )  omeprazole (PRILOSEC)  finasteride  (PROSCAR )    No Alcohol for 24 hours before or after surgery.  No Smoking including e-cigarettes for 24 hours before surgery.  No chewable tobacco products for at least 6 hours before surgery.  No nicotine patches on the day of surgery.  Do not use any recreational drugs for at least a week (preferably 2 weeks) before your surgery.  Please be advised that the combination of cocaine and anesthesia may have negative outcomes, up to and including death. If you test positive for cocaine,  your surgery will be cancelled.  On the morning of surgery brush your teeth with toothpaste and water , you may rinse your mouth with mouthwash if you wish. Do not swallow any toothpaste or mouthwash.  Use CHG Soap or wipes as directed on instruction sheet.  Do not wear jewelry, make-up, hairpins, clips or nail polish.  For welded (permanent) jewelry: bracelets, anklets, waist bands, etc.  Please have this removed prior to surgery.  If it is not removed, there is a chance that hospital personnel will need to cut it off on the day of surgery.  Do not wear lotions, powders, or perfumes.   Do not shave body hair from the neck down 48 hours before surgery.  Contact lenses, hearing aids and dentures may not be worn into surgery.  Do not bring valuables to the hospital. [facility] is not responsible for any missing/lost belongings or valuables.   Bring your C-PAP to the hospital in case you may have to spend the night.   Notify your doctor if there is any change in your medical condition (cold, fever, infection).  Wear comfortable clothing (specific to your surgery type) to the hospital.  After surgery, you can help prevent lung complications by doing breathing exercises.  Take deep breaths and cough every 1-2 hours. Your doctor may order a device called an Incentive Spirometer to help you take deep breaths.  When coughing or sneezing, hold a pillow firmly against your incision with both hands. This is called "splinting." Doing this helps protect your incision. It also decreases belly discomfort.  If  you are being admitted to the hospital overnight, leave your suitcase in the car. After surgery it may be brought to your room.  In case of increased patient census, it may be necessary for you, the patient, to continue your postoperative care in the Same Day Surgery department.  If you are being discharged the day of surgery, you will not be allowed to drive home. You will need a  responsible individual to drive you home and stay with you for 24 hours after surgery.   If you are taking public transportation, you will need to have a responsible individual with you.  Please call the Pre-admissions Testing Dept. at [phone] if you have any questions about these instructions.  Surgery Visitation Policy:  Patients having surgery or a procedure may have two visitors.  Children under the age of 69 must have an adult with them who is not the patient.  Inpatient Visitation:    Visiting hours are 7 a.m. to 8 p.m. Up to four visitors are allowed at one time in a patient room. The visitors may rotate out with other people during the day.  One visitor age 67 or older may stay with the patient overnight and must be in the room by 8 p.m.   Merchandiser, retail to address health-related social needs:  [email or web address]     Preparing for Surgery with CHLORHEXIDINE  GLUCONATE (CHG) Soap  Chlorhexidine  Gluconate (CHG) Soap  o An antiseptic cleaner that kills germs and bonds with the skin to continue killing germs even after washing  o Used for showering the night before surgery and morning of surgery  Before surgery, you can play an important role by reducing the number of germs on your skin.  CHG (Chlorhexidine  gluconate) soap is an antiseptic cleanser which kills germs and bonds with the skin to continue killing germs even after washing.  Please do not use if you have an allergy to CHG or antibacterial soaps. If your skin becomes reddened/irritated stop using the CHG.  1. Shower the NIGHT BEFORE SURGERY and the MORNING OF SURGERY with CHG soap.  2. If you choose to wash your hair, wash your hair first as usual with your normal shampoo.  3. After shampooing, rinse your hair and body thoroughly to remove the shampoo.  4. Use CHG as you would any other liquid soap. You can apply CHG directly to the skin and wash gently with a scrungie or a  clean washcloth.  5. Apply the CHG soap to your body only from the neck down. Do not use on open wounds or open sores. Avoid contact with your eyes, ears, mouth, and genitals (private parts). Wash face and genitals (private parts) with your normal soap.  6. Wash thoroughly, paying special attention to the area where your surgery will be performed.  7. Thoroughly rinse your body with warm water .  8. Do not shower/wash with your normal soap after using and rinsing off the CHG soap.  9. Pat yourself dry with a clean towel.  10. Wear clean pajamas to bed the night before surgery.  12. Place clean sheets on your bed the night of your first shower and do not sleep with pets.  13. Shower again with the CHG soap on the day of surgery prior to arriving at the hospital.  14. Do not apply any deodorants/lotions/powders.  15. Please wear clean clothes to the hospital.

## 2024-06-13 ENCOUNTER — Encounter: Payer: Self-pay | Admitting: Surgery

## 2024-06-13 NOTE — Progress Notes (Signed)
 Perioperative / Anesthesia Services  Pre-Admission Testing Clinical Review / Pre-Operative Anesthesia Consult  Date: 06/13/24  PATIENT DEMOGRAPHICS: Name: Justin Soto DOB: [birth year] MRN:   [id number]  Note: Available PAT nursing documentation and vital signs have been reviewed. Clinical nursing staff has updated patient's PMH/PSHx, current medication list, and drug allergies/intolerances to ensure complete and comprehensive history available to assist care teams in MDM as it pertains to the aforementioned surgical procedure and anticipated anesthetic course. Extensive review of available clinical information personally performed. Justin Soto PMH and PSHx updated with any diagnoses/procedures that  may have been inadvertently omitted during his intake with the pre-admission testing department's nursing staff.  PLANNED SURGICAL PROCEDURE(S):   Case: [id number] Date/Time: 06/19/24 0932   Procedure: REPAIR, HERNIA, UMBILICAL, ADULT   Anesthesia type: General   Diagnosis: Umbilical hernia without obstruction and without gangrene [K42.9]   Pre-op diagnosis: umbilical hernia   Location: [facility] OR ROOM 07 / [facility]   Surgeons: Lane Shope, MD        CLINICAL DISCUSSION: Justin Soto is a 63 y.o. male who is submitted for pre-surgical anesthesia review and clearance prior to him undergoing the above procedure. Patient has never been a smoker in the past. Pertinent PMH includes: CAD, palpitations, HTN, HLD, T2DM, dyspnea, OSAH (requires nocturnal PAP therapy), GERD (on daily PPI), IDA, thalassemia, BPH, penile adhesions, nephrolithiasis, OA, RA, cervical DDD (s/p ACDF C3-C7), lumbar stenosis with neurogenic claudication (s/p laminectomy and fusion L3-L5 and laminectomy L5-S1).   Patient is followed by cardiology Jodeen, MD). He was last seen in the cardiology clinic on 01/30/2024; notes reviewed. At the time of his clinic visit, patient doing well overall from a  cardiovascular perspective. Patient denied any chest pain, shortness of breath, PND, orthopnea, palpitations, significant peripheral edema, weakness, fatigue, vertiginous symptoms, or presyncope/syncope. Patient with a past medical history significant for cardiovascular diagnoses. Documented physical exam was grossly benign, providing no evidence of acute exacerbation and/or decompensation of the patient's known cardiovascular conditions.  Most recent myocardial perfusion imaging study was performed on 11/30/2016 revealing a normal left ventricular systolic function with an EF of 62%.  There were no regional wall motion abnormalities.  There is no evidence of stress-induced myocardial ischemia or arrhythmia; no scintigraphic evidence of scar.  Study determined to be normal and low risk.   Stress echocardiogram was performed on 12/25/2018 revealing a normal left ventricular systolic function with an EF of >55%.  There were no regional wall motion abnormalities.  No significant valvular abnormalities were noted.  Patient stressed for a total of 5 minutes achieving a max heart rate of 141 bpm.  Maximum workload of 7 METS was achieved during stress.   Coronary CTA was performed on 06/27/2019 demonstrating moderate three-vessel coronary artery disease with 30-50% focal stenosis in the proximal RCA, mid LAD, and proximal OM1.  CT FFR analysis was performed (ranges: < 0.75 high likelihood of hemodynamically significant stenosis, 0.76-0.80 borderline, > 0.80 normal):   LM: normal FFR of greater than 0.8  LAD: normal FFR throughout the vessel. FFR = 0.84 at the distal most aspect.  LCx: normal FFR throughout the vessel. FFR = 0.9 at the distal aspect of the vessel  RCA: normal FFR throughout the vessel. FFR = 0.93 of the distal aspect.   Blood pressure reasonably controlled at 132/80 mmHg on currently prescribed CCB (amlodipine ), ACEi (benazepril ) diuretic (HCTZ), and beta-blocker (metoprolol  succinate)  therapies.  Patient is on atorvastatin  for his HLD diagnosis and ASCVD  prevention.  At the time of patient's visit with cardiology, T2DM demonstrated adequate control with an A1c of 6.0%.  Of note, since patient was last seen by cardiology, A1c has been rechecked with further improvement down to 5.7% In the setting of known cardiovascular diagnoses and concurrent T2DM, patient is taking an SGLT2i (dapagliflozin ) for added cardiovascular and renovascular protection.  Patient does have an OSAH diagnosis and is reported to be compliant with prescribed nocturnal PAP therapy.  Patient making efforts to remain active.  He is still employed as a Engineer, structural.  He is able to complete all his ADLs/IADLs without cardiovascular limitation.  Per the [facility], patient is able to exceed 4 METS of physical activity without experiencing any significant degrees of angina/anginal equivalent symptoms. No changes were made to his medication regimen during his visit with cardiology.  Patient scheduled to follow-up with outpatient cardiology in 1 year or sooner if needed.   Justin Soto is scheduled for an elective REPAIR, HERNIA, UMBILICAL, ADULT on 06/19/2024 with Dr. Honor Leghorn, MD.  Given patient's past medical history significant for cardiovascular diagnoses, presurgical cardiac clearance was sought by the PAT team. Per cardiology, this patient is optimized for surgery and may proceed with the planned procedural course with a LOW risk of significant perioperative cardiovascular complications.  In review of the patient's chart, it is noted that he is on daily oral antithrombotic therapy. He has been instructed on recommendations for holding his daily low dose ASA for 5 days prior to his procedure with plans to restart as soon as postoperative bleeding risk felt to be minimized by his primary attending surgeon. The patient has been instructed that his last dose of should be on 06/13/2024.  Patient denies previous perioperative  complications with anesthesia in the past. Patient does have a (+) family history of PONV in a first-degree relative (mother). In review his EMR, it is noted that patient underwent a general anesthetic course here at [facility] (ASA III) in 04/2024 without documented complications.   MOST RECENT VITAL SIGNS:    06/04/2024    9:53 AM 05/22/2024    8:05 AM 04/24/2024    9:50 AM  Vitals with BMI  Height 5' 6 5' 6 5' 6  Weight 237 lbs 238 lbs 240 lbs  BMI 38.27 38.43 38.76  Systolic 139 137 873  Diastolic 85 82 83  Pulse 88 96 99   PROVIDERS/SPECIALISTS: NOTE: Primary physician provider listed below. Patient may have been seen by APP or partner within same practice.   PROVIDER ROLE / SPECIALTY LAST SHERLEAN Leghorn Honor, MD General Surgery (Surgeon) 06/04/2024  Donzella Lauraine SAILOR, DO Primary Care Provider 04/05/2024  Wilburn Fillers, MD Cardiology 01/30/2024   ALLERGIES: Allergies  Allergen Reactions   Tizanidine Nausea Only   Methocarbamol Other (See Comments)    Numbness, and change of taste    Vicodin [Hydrocodone -Acetaminophen ] Itching    Can take with benadryl      CURRENT HOME MEDICATIONS: No current facility-administered medications for this encounter.    amLODipine -benazepril  (LOTREL) 10-40 MG capsule   aspirin EC 81 MG tablet   atorvastatin  (LIPITOR) 40 MG tablet   cetirizine  (ZYRTEC ) 10 MG tablet   cyclobenzaprine  (FLEXERIL ) 10 MG tablet   dapagliflozin  propanediol (FARXIGA ) 10 MG TABS tablet   fluticasone  (FLONASE ) 50 MCG/ACT nasal spray   gabapentin  (NEURONTIN ) 600 MG tablet   hydrochlorothiazide  (HYDRODIURIL ) 25 MG tablet   meloxicam  (MOBIC ) 15 MG tablet   metoprolol  succinate (TOPROL -XL)  50 MG 24 hr tablet   montelukast  (SINGULAIR ) 10 MG tablet   Multiple Vitamin (MULTIVITAMIN WITH MINERALS) TABS tablet   omeprazole (PRILOSEC) 20 MG capsule   Semaglutide , 2 MG/DOSE, 8 MG/3ML SOPN   sertraline  (ZOLOFT ) 50 MG tablet    betamethasone  dipropionate 0.05 % cream   finasteride  (PROSCAR ) 5 MG tablet   fluconazole  (DIFLUCAN ) 100 MG tablet   mupirocin  ointment (BACTROBAN ) 2 %   NON FORMULARY   nystatin  ointment (MYCOSTATIN )   tamsulosin  (FLOMAX ) 0.4 MG CAPS capsule   HISTORY: Past Medical History:  Diagnosis Date   ADHD (attention deficit hyperactivity disorder)    Anxiety    Arthritis    BPH (benign prostatic hyperplasia)    Coronary artery disease    DDD (degenerative disc disease), cervical    a.) a/p ACDF C3-C7   DDD (degenerative disc disease), lumbar    a.) s/p laminectomy and fusion L3-L5; b.) s/p laminectomy L5-S1   DM (diabetes mellitus), type 2 (HCC)    Dyspnea    Environmental and seasonal allergies    Family history of adverse reaction to anesthesia    a.) PONV in 1st degree relative (mother)   GERD (gastroesophageal reflux disease)    History of kidney stones    History of prostatitis    HOH (hard of hearing); wears bilateral hearing aids    Hyperlipidemia    Hypertension    IDA (iron deficiency anemia)    Inguinal hernia    Leg pain, right    Lumbar stenosis with neurogenic claudication    Morbid obesity (HCC)    MRSA (methicillin resistant staph aureus) culture positive    Neck pain of over 3 months duration    Obesity    OSA on CPAP    Palpitations    Penile adhesions    Pneumonia    Prostate cancer (HCC)    RA (rheumatoid arthritis) (HCC)    a.) followed by rheumatology; negative rheumatoid factor; Dx based on pattern of joint involvement, gelling phenomenon, and mildly prolonged morning stiffness   Thalassemia    Tick fever    Umbilical hernia    Past Surgical History:  Procedure Laterality Date   ANTERIOR CERVICAL DECOMP/DISCECTOMY FUSION N/A 11/13/2014   Procedure: Cervical three-four, Cervical six-seven anterior cervical decompression with fusion interbody prosthesis plating and bonegraft;  Surgeon: Reyes Budge, MD;  Location: [facility] OR;  Service: Neurosurgery;   Laterality: N/A;  Cervical three-four, Cervical six-seven anterior cervical decompression with fusion interbody prosthesis plating and bonegraft   CALCANEAL OSTEOTOMY Right 01/11/2017   Procedure: CALCANEAL OSTEOTOMY;  Surgeon: Kayla Pinal, MD;  Location: [facility] ORS;  Service: Orthopedics;  Laterality: Right;   CIRCUMCISION N/A 12/13/2017   Procedure: PENILE BLOCK, LYSIS OF PENILE ADHESIONS;  Surgeon: Penne Knee, MD;  Location: [facility] ORS;  Service: Urology;  Laterality: N/A;   COLONOSCOPY     COLONOSCOPY WITH PROPOFOL  N/A 09/15/2021   Procedure: COLONOSCOPY WITH PROPOFOL ;  Surgeon: Toledo, Ladell POUR, MD;  Location: [facility];  Service: Gastroenterology;  Laterality: N/A;  DM   CYSTOSCOPY N/A 12/13/2017   Procedure: CYSTOSCOPY;  Surgeon: Penne Knee, MD;  Location: [facility] ORS;  Service: Urology;  Laterality: N/A;   CYSTOSCOPY N/A 04/16/2024   Procedure: CYSTOSCOPY;  Surgeon: Twylla Glendia BROCKS, MD;  Location: [facility] ORS;  Service: Urology;  Laterality: N/A;   DIRECT LARYNGOSCOPY N/A 11/24/2020   Procedure: MICRO DIRECT LARYNGOSCOPY W/BIOPSY;  Surgeon: Herminio Miu, MD;  Location: [facility] ORS;  Service: ENT;  Laterality: N/A;   HYDROCELE  EXCISION / REPAIR     INGUINAL HERNIA REPAIR Left    LEG SURGERY Right    Had surgery on right calf X 7   LESION DESTRUCTION N/A 04/16/2024   Procedure: DESTRUCTION, LESION, PENIS;  Surgeon: Twylla Glendia BROCKS, MD;  Location: [facility] ORS;  Service: Urology;  Laterality: N/A;   LITHOTRIPSY     LUMBAR LAMINECTOMY (L5-S1) N/A    LUMBAR LAMINECTOMY AND FUSION (L3-L5) N/A    LUMBAR WOUND DEBRIDEMENT N/A 11/28/2019   Procedure: LUMBAR WOUND DEBRIDEMENT;  Surgeon: Mavis Purchase, MD;  Location: [facility] OR;  Service: Neurosurgery;  Laterality: N/A;  LUMBAR WOUND DEBRIDEMENT   PENILE BIOPSY N/A 04/16/2024   Procedure: BIOPSY, PENIS;  Surgeon: Twylla Glendia BROCKS, MD;  Location: [facility] ORS;  Service: Urology;  Laterality: N/A;   QUADRICEPS TENDON REPAIR Left 06/22/2015    Procedure: REPAIR QUADRICEP TENDON;  Surgeon: Kayla Pinal, MD;  Location: [facility] ORS;  Service: Orthopedics;  Laterality: Left;   QUADRICEPS TENDON REPAIR Left 08/12/2015   Procedure: REPAIR QUADRICEP TENDON;  Surgeon: Kayla Pinal, MD;  Location: [facility] ORS;  Service: Orthopedics;  Laterality: Left;   ROTATOR CUFF REPAIR Left    TONSILLECTOMY     VASECTOMY     Family History  Problem Relation Age of Onset   Prostate cancer Father    Heart disease Father    Diabetes Father    Alzheimer's disease Father    Anemia Mother    Osteoporosis Mother    Healthy Sister    Diabetes Brother    Hypertension Brother    Anemia Daughter    Diabetes Brother    Kidney disease Neg Hx    Bladder Cancer Neg Hx    Colon cancer Neg Hx    Social History   Tobacco Use   Smoking status: Never    Passive exposure: Never   Smokeless tobacco: Never  Substance Use Topics   Alcohol use: No   LABS:  Lab Results  Component Value Date   WBC 4.5 04/11/2024   HGB 11.3 (L) 04/11/2024   HCT 35.1 (L) 04/11/2024   MCV 63.1 (L) 04/11/2024   PLT 172 04/11/2024   Lab Results  Component Value Date   NA 138 04/05/2024   CL 98 04/05/2024   K 3.5 04/05/2024   CO2 23 04/05/2024   BUN 13 04/05/2024   CREATININE 0.93 04/05/2024   EGFR 92 04/05/2024   CALCIUM  9.8 04/05/2024   ALBUMIN  4.4 04/05/2024   GLUCOSE 102 (H) 04/05/2024   Lab Results  Component Value Date   HGBA1C 5.7 (H) 04/05/2024    ECG: Date: 04/11/2024  Time ECG obtained: 0843 AM Rate: 89 bpm Rhythm: normal sinus Axis (leads I and aVF): normal Intervals: PR 192 ms. QRS 92 ms. QTc 438 ms. ST segment and T wave changes: Inferior T wave abnormalities Evidence of a possible, age undetermined, prior infarct:  No Comparison: Similar to previous tracing obtained on 01/31/2022   IMAGING / PROCEDURES: MR CERVICAL SPINE WO CONTRAST performed on 05/04/2023 Prior ACDF at C3-4 and C6-7 without significant residual spinal  stenosis. Multifactorial degenerative changes at C2-3 and C7-T1 with resultant mild spinal stenosis, with moderate to severe right C3 and bilateral C8 foraminal narrowing as above. Changes are mildly progressed as compared to previous MRI from 08/06/2019. 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   MR LUMBAR SPINE W WO CONTRAST  performed on 05/03/2022 Degenerative changes of the lumbar spine, not significantly changed since  prior MRI. Severe hypertrophic arthropathy at L5-S1 with severe bilateral neural foraminal narrowing. Moderate spinal canal stenosis at L1-2 and severe at L2-3.   CT HEART ANGIOGRAM INCLUDING 3D IMAGING performed on 06/27/2019 Moderate three-vessel coronary artery calcifications with approximately 30-50% focal stenosis in the proximal RCA, mid LAD, and proximal OM1. CAD-RADS 3.  Normal heart size and function. Mild circumferential left ventricular hypertrophy.  Tiny pulmonary nodules. Consider follow-up CT in 12 months if patient is high risk for lung malignancy.  CT-FFR analysis (ranges: < 0.75 high likelihood of hemodynamically significant stenosis, 0.76-0.80 borderline, > 0.80 normal): Left Main Artery: Normal FFR of greater than 0.8  Left Anterior Descending System: Normal FFR throughout the vessel. FFR equals 0.84 at the distal most aspect.  Left Circumflex System: Normal FFR throughout the vessel. FFR equals 0.9 at the distal aspect of the vessel  Right Coronary Artery system: Normal FFR throughout the vessel. FFR equals 0.93 of the distal aspect.    STRESS ECHOCARDIOGRAM performed on 12/25/2018 Stress duration 5 minutes and 0 seconds Maximum heart rate 141 bpm Maximum workload = 7.00 METS Normal left ventricular systolic function with an EF of >55% No regional wall motion abnormalities No significant valvular abnormalities Normal study   MYOCARDIAL PERFUSION IMAGING STUDY (LEXISCAN) performed on 11/30/2016 Normal left  ventricular systolic function with a normal LVEF of 60 to % Normal myocardial thickening and wall motion Left ventricular cavity size normal SPECT images demonstrate homogenous tracer distribution throughout the myocardium TID ratio = 0.99 No evidence of stress-induced myocardial ischemia or arrhythmia Normal low risk study   IMPRESSION AND PLAN: Justin Soto has been referred for pre-anesthesia review and clearance prior to him undergoing the planned anesthetic and procedural courses. Available labs, pertinent testing, and imaging results were personally reviewed by me in preparation for upcoming operative/procedural course. [facility] medical record has been updated following extensive record review and patient interview with PAT staff.   This patient has been appropriately cleared by cardiology with an overall LOW risk of patient experiencing significant perioperative cardiovascular complications. Based on clinical review performed today (06/13/24), barring any significant acute changes in the patient's overall condition, it is anticipated that he will be able to proceed with the planned surgical intervention. Any acute changes in clinical condition may necessitate his procedure being postponed and/or cancelled. Patient will meet with anesthesia team (MD and/or CRNA) on the day of his procedure for preoperative evaluation/assessment. Questions regarding anesthetic course will be fielded at that time.   Pre-surgical instructions were reviewed with the patient during his PAT appointment, and questions were fielded to satisfaction by PAT clinical staff. He has been instructed on which medications that he will need to hold prior to surgery, as well as the ones that have been deemed safe/appropriate to take on the day of his procedure. As part of the general education provided by PAT, patient made aware both verbally and in writing, that he would need to abstain from the use of any illegal substances  during his perioperative course. He was advised that failure to follow the provided instructions could necessitate case cancellation or result in serious perioperative complications up to and including death. Patient encouraged to contact PAT and/or his surgeon's office to discuss any questions or concerns that may arise prior to surgery; verbalized understanding.   Dorise Pereyra, MSN, APRN, FNP-C, CEN [facility]  Perioperative Services Nurse Practitioner Phone: [phone] Fax: [phone] 06/13/24 4:16 PM  NOTE: This note has been prepared  using Scientist, clinical (histocompatibility and immunogenetics). Despite my best ability to proofread, there is always the potential that unintentional transcriptional errors may still occur from this process.

## 2024-06-14 ENCOUNTER — Other Ambulatory Visit: Payer: Self-pay | Admitting: Family Medicine

## 2024-06-14 DIAGNOSIS — E118 Type 2 diabetes mellitus with unspecified complications: Secondary | ICD-10-CM

## 2024-06-19 ENCOUNTER — Ambulatory Visit: Payer: Self-pay | Admitting: Urgent Care

## 2024-06-19 ENCOUNTER — Ambulatory Visit
Admission: RE | Admit: 2024-06-19 | Discharge: 2024-06-19 | Disposition: A | Discharge status: Home / Self Care | Source: Ambulatory Visit | Attending: Surgery | Admitting: Surgery

## 2024-06-19 ENCOUNTER — Other Ambulatory Visit: Payer: Self-pay

## 2024-06-19 ENCOUNTER — Encounter: Payer: Self-pay | Admitting: Surgery

## 2024-06-19 ENCOUNTER — Encounter
Admission: RE | Disposition: A | Discharge status: Home / Self Care | Payer: Self-pay | Source: Ambulatory Visit | Attending: Surgery

## 2024-06-19 DIAGNOSIS — G4733 Obstructive sleep apnea (adult) (pediatric): Secondary | ICD-10-CM | POA: Diagnosis not present

## 2024-06-19 DIAGNOSIS — K429 Umbilical hernia without obstruction or gangrene: Secondary | ICD-10-CM | POA: Diagnosis not present

## 2024-06-19 DIAGNOSIS — I2510 Atherosclerotic heart disease of native coronary artery without angina pectoris: Secondary | ICD-10-CM | POA: Insufficient documentation

## 2024-06-19 DIAGNOSIS — Z01812 Encounter for preprocedural laboratory examination: Secondary | ICD-10-CM

## 2024-06-19 DIAGNOSIS — I10 Essential (primary) hypertension: Secondary | ICD-10-CM | POA: Insufficient documentation

## 2024-06-19 DIAGNOSIS — M069 Rheumatoid arthritis, unspecified: Secondary | ICD-10-CM | POA: Diagnosis not present

## 2024-06-19 DIAGNOSIS — E119 Type 2 diabetes mellitus without complications: Secondary | ICD-10-CM | POA: Diagnosis not present

## 2024-06-19 DIAGNOSIS — Z79899 Other long term (current) drug therapy: Secondary | ICD-10-CM | POA: Diagnosis not present

## 2024-06-19 DIAGNOSIS — N400 Enlarged prostate without lower urinary tract symptoms: Secondary | ICD-10-CM | POA: Insufficient documentation

## 2024-06-19 DIAGNOSIS — K219 Gastro-esophageal reflux disease without esophagitis: Secondary | ICD-10-CM | POA: Insufficient documentation

## 2024-06-19 DIAGNOSIS — M6208 Separation of muscle (nontraumatic), other site: Secondary | ICD-10-CM | POA: Insufficient documentation

## 2024-06-19 DIAGNOSIS — M1990 Unspecified osteoarthritis, unspecified site: Secondary | ICD-10-CM | POA: Diagnosis not present

## 2024-06-19 DIAGNOSIS — Z7982 Long term (current) use of aspirin: Secondary | ICD-10-CM | POA: Diagnosis not present

## 2024-06-19 DIAGNOSIS — E785 Hyperlipidemia, unspecified: Secondary | ICD-10-CM | POA: Insufficient documentation

## 2024-06-19 DIAGNOSIS — D569 Thalassemia, unspecified: Secondary | ICD-10-CM | POA: Diagnosis not present

## 2024-06-19 DIAGNOSIS — Z981 Arthrodesis status: Secondary | ICD-10-CM | POA: Diagnosis not present

## 2024-06-19 HISTORY — PX: UMBILICAL HERNIA REPAIR: SHX196

## 2024-06-19 LAB — POCT I-STAT, CHEM 8
BUN: 12 mg/dL (ref 8–23)
Calcium, Ion: 1.24 mmol/L (ref 1.15–1.40)
Chloride: 102 mmol/L (ref 98–111)
Creatinine, Ser: 0.8 mg/dL (ref 0.61–1.24)
Glucose, Bld: 103 mg/dL — ABNORMAL HIGH (ref 70–99)
HCT: 35 % — ABNORMAL LOW (ref 39.0–52.0)
Hemoglobin: 11.9 g/dL — ABNORMAL LOW (ref 13.0–17.0)
Potassium: 3.3 mmol/L — ABNORMAL LOW (ref 3.5–5.1)
Sodium: 139 mmol/L (ref 135–145)
TCO2: 25 mmol/L (ref 22–32)

## 2024-06-19 LAB — GLUCOSE, CAPILLARY: Glucose-Capillary: 131 mg/dL — ABNORMAL HIGH (ref 70–99)

## 2024-06-19 SURGERY — REPAIR, HERNIA, UMBILICAL, ADULT
Anesthesia: General | Site: Abdomen

## 2024-06-19 MED ORDER — BUPIVACAINE-EPINEPHRINE (PF) 0.25% -1:200000 IJ SOLN
INTRAMUSCULAR | Status: AC
  Filled 2024-06-19: qty 30

## 2024-06-19 MED ORDER — CEFAZOLIN SODIUM-DEXTROSE 2-4 GM/100ML-% IV SOLN
INTRAVENOUS | Status: AC
  Filled 2024-06-19: qty 100

## 2024-06-19 MED ORDER — CELECOXIB 200 MG PO CAPS
200.0000 mg | ORAL_CAPSULE | ORAL | Status: AC
  Administered 2024-06-19: 200 mg via ORAL

## 2024-06-19 MED ORDER — ORAL CARE MOUTH RINSE: 15.0000 mL | Freq: Once | OROMUCOSAL | Status: AC

## 2024-06-19 MED ORDER — SODIUM CHLORIDE 0.9 % IV SOLN: INTRAVENOUS | Status: DC

## 2024-06-19 MED ORDER — LIDOCAINE HCL (PF) 2 % IJ SOLN
INTRAMUSCULAR | Status: AC
  Filled 2024-06-19: qty 5

## 2024-06-19 MED ORDER — CEFAZOLIN SODIUM-DEXTROSE 2-4 GM/100ML-% IV SOLN
2.0000 g | INTRAVENOUS | Status: AC
  Administered 2024-06-19: 2 g via INTRAVENOUS

## 2024-06-19 MED ORDER — MIDAZOLAM HCL 2 MG/2ML IJ SOLN
INTRAMUSCULAR | Status: AC
  Filled 2024-06-19: qty 2

## 2024-06-19 MED ORDER — IBUPROFEN 800 MG PO TABS: 800.0000 mg | ORAL_TABLET | Freq: Three times a day (TID) | ORAL | 0 refills | Status: AC | PRN

## 2024-06-19 MED ORDER — CHLORHEXIDINE GLUCONATE CLOTH 2 % EX PADS
6.0000 | MEDICATED_PAD | Freq: Once | CUTANEOUS | Status: AC
  Administered 2024-06-19: 6 via TOPICAL

## 2024-06-19 MED ORDER — GABAPENTIN 300 MG PO CAPS
300.0000 mg | ORAL_CAPSULE | ORAL | Status: AC
  Administered 2024-06-19: 300 mg via ORAL

## 2024-06-19 MED ORDER — FENTANYL CITRATE (PF) 100 MCG/2ML IJ SOLN
INTRAMUSCULAR | Status: DC | PRN
  Administered 2024-06-19 (×2): 50 ug via INTRAVENOUS

## 2024-06-19 MED ORDER — PROPOFOL 1000 MG/100ML IV EMUL
INTRAVENOUS | Status: AC
  Filled 2024-06-19: qty 200

## 2024-06-19 MED ORDER — MIDAZOLAM HCL 2 MG/2ML IJ SOLN
INTRAMUSCULAR | Status: DC | PRN
  Administered 2024-06-19: 2 mg via INTRAVENOUS

## 2024-06-19 MED ORDER — OXYCODONE HCL 5 MG PO TABS: 5.0000 mg | ORAL_TABLET | Freq: Four times a day (QID) | ORAL | 0 refills | Status: DC | PRN

## 2024-06-19 MED ORDER — LIDOCAINE HCL (CARDIAC) PF 100 MG/5ML IV SOSY
PREFILLED_SYRINGE | INTRAVENOUS | Status: DC | PRN
  Administered 2024-06-19: 100 mg via INTRAVENOUS

## 2024-06-19 MED ORDER — PROPOFOL 10 MG/ML IV BOLUS
INTRAVENOUS | Status: DC | PRN
  Administered 2024-06-19: 200 mg via INTRAVENOUS

## 2024-06-19 MED ORDER — ONDANSETRON HCL 4 MG/2ML IJ SOLN
INTRAMUSCULAR | Status: AC
  Filled 2024-06-19: qty 2

## 2024-06-19 MED ORDER — BUPIVACAINE-EPINEPHRINE 0.25% -1:200000 IJ SOLN
INTRAMUSCULAR | Status: DC | PRN
  Administered 2024-06-19: 10 mL

## 2024-06-19 MED ORDER — DEXMEDETOMIDINE HCL IN NACL 80 MCG/20ML IV SOLN
INTRAVENOUS | Status: DC | PRN
Start: 2024-06-19 — End: 2024-06-19
  Administered 2024-06-19: 8 ug via INTRAVENOUS

## 2024-06-19 MED ORDER — 0.9 % SODIUM CHLORIDE (POUR BTL) OPTIME
TOPICAL | Status: DC | PRN
  Administered 2024-06-19: 500 mL

## 2024-06-19 MED ORDER — CHLORHEXIDINE GLUCONATE 0.12 % MT SOLN
OROMUCOSAL | Status: AC
Start: 2024-06-19 — End: 2024-06-19
  Filled 2024-06-19: qty 15

## 2024-06-19 MED ORDER — ONDANSETRON HCL 4 MG/2ML IJ SOLN
INTRAMUSCULAR | Status: DC | PRN
  Administered 2024-06-19: 4 mg via INTRAVENOUS

## 2024-06-19 MED ORDER — ACETAMINOPHEN 500 MG PO TABS
ORAL_TABLET | ORAL | Status: AC
  Filled 2024-06-19: qty 2

## 2024-06-19 MED ORDER — CHLORHEXIDINE GLUCONATE 0.12 % MT SOLN
15.0000 mL | Freq: Once | OROMUCOSAL | Status: AC
  Administered 2024-06-19: 15 mL via OROMUCOSAL

## 2024-06-19 MED ORDER — OXYCODONE HCL 5 MG PO TABS
5.0000 mg | ORAL_TABLET | Freq: Once | ORAL | Status: AC
  Administered 2024-06-19: 5 mg via ORAL

## 2024-06-19 MED ORDER — MORPHINE SULFATE (PF) 2 MG/ML IV SOLN: 2.0000 mg | INTRAVENOUS | Status: DC | PRN

## 2024-06-19 MED ORDER — BUPIVACAINE LIPOSOME 1.3 % IJ SUSP: 20.0000 mL | Freq: Once | INTRAMUSCULAR | Status: DC

## 2024-06-19 MED ORDER — ACETAMINOPHEN 500 MG PO TABS
1000.0000 mg | ORAL_TABLET | ORAL | Status: AC
  Administered 2024-06-19: 1000 mg via ORAL

## 2024-06-19 MED ORDER — DEXAMETHASONE SODIUM PHOSPHATE 10 MG/ML IJ SOLN
INTRAMUSCULAR | Status: DC | PRN
  Administered 2024-06-19: 10 mg via INTRAVENOUS

## 2024-06-19 MED ORDER — EPHEDRINE SULFATE-NACL 50-0.9 MG/10ML-% IV SOSY
PREFILLED_SYRINGE | INTRAVENOUS | Status: DC | PRN
  Administered 2024-06-19: 2.5 mg via INTRAVENOUS
  Administered 2024-06-19: 10 mg via INTRAVENOUS

## 2024-06-19 MED ORDER — GABAPENTIN 300 MG PO CAPS
ORAL_CAPSULE | ORAL | Status: AC
  Filled 2024-06-19: qty 1

## 2024-06-19 MED ORDER — PHENYLEPHRINE 80 MCG/ML (10ML) SYRINGE FOR IV PUSH (FOR BLOOD PRESSURE SUPPORT)
PREFILLED_SYRINGE | INTRAVENOUS | Status: DC | PRN
  Administered 2024-06-19: 200 ug via INTRAVENOUS
  Administered 2024-06-19: 160 ug via INTRAVENOUS

## 2024-06-19 MED ORDER — CELECOXIB 200 MG PO CAPS
ORAL_CAPSULE | ORAL | Status: AC
  Filled 2024-06-19: qty 1

## 2024-06-19 MED ORDER — FENTANYL CITRATE (PF) 100 MCG/2ML IJ SOLN
INTRAMUSCULAR | Status: AC
  Filled 2024-06-19: qty 2

## 2024-06-19 MED ORDER — DEXAMETHASONE SODIUM PHOSPHATE 10 MG/ML IJ SOLN
INTRAMUSCULAR | Status: AC
  Filled 2024-06-19: qty 1

## 2024-06-19 MED ORDER — SUCCINYLCHOLINE CHLORIDE 200 MG/10ML IV SOSY
PREFILLED_SYRINGE | INTRAVENOUS | Status: DC | PRN
  Administered 2024-06-19: 120 mg via INTRAVENOUS

## 2024-06-19 MED ORDER — MORPHINE SULFATE (PF) 4 MG/ML IV SOLN
INTRAVENOUS | Status: AC
  Filled 2024-06-19: qty 1

## 2024-06-19 MED ORDER — OXYCODONE HCL 5 MG PO TABS
ORAL_TABLET | ORAL | Status: AC
Start: 2024-06-19 — End: 2024-06-19
  Filled 2024-06-19: qty 1

## 2024-06-19 SURGICAL SUPPLY — 23 items
BLADE CLIPPER SURG (BLADE) IMPLANT
BLADE SURG 15 STRL LF DISP TIS (BLADE) ×1 IMPLANT
CHLORAPREP W/TINT 26 (MISCELLANEOUS) IMPLANT
DERMABOND ADVANCED .7 DNX12 (GAUZE/BANDAGES/DRESSINGS) ×1 IMPLANT
DRAPE LAPAROTOMY 77X122 PED (DRAPES) ×1 IMPLANT
ELECTRODE REM PT RTRN 9FT ADLT (ELECTROSURGICAL) ×1 IMPLANT
GAUZE 4X4 16PLY ~~LOC~~+RFID DBL (SPONGE) IMPLANT
GLOVE ORTHO TXT STRL SZ7.5 (GLOVE) ×1 IMPLANT
GOWN STRL REUS W/ TWL LRG LVL3 (GOWN DISPOSABLE) ×1 IMPLANT
GOWN STRL REUS W/ TWL XL LVL3 (GOWN DISPOSABLE) ×1 IMPLANT
KIT TURNOVER KIT A (KITS) ×1 IMPLANT
MANIFOLD NEPTUNE II (INSTRUMENTS) ×1 IMPLANT
NDL HYPO 22X1.5 SAFETY MO (MISCELLANEOUS) ×1 IMPLANT
NEEDLE HYPO 22X1.5 SAFETY MO (MISCELLANEOUS) ×1 IMPLANT
NS IRRIG 500ML POUR BTL (IV SOLUTION) ×1 IMPLANT
PACK BASIN MINOR ARMC (MISCELLANEOUS) ×1 IMPLANT
SPIKE FLUID TRANSFER (MISCELLANEOUS) ×1 IMPLANT
SUT ETHIBOND 0 MO6 C/R (SUTURE) ×1 IMPLANT
SUT VIC AB 3-0 SH 27X BRD (SUTURE) ×1 IMPLANT
SUTURE MNCRL 4-0 27XMF (SUTURE) ×1 IMPLANT
SYR 10ML LL (SYRINGE) ×1 IMPLANT
TRAP FLUID SMOKE EVACUATOR (MISCELLANEOUS) ×1 IMPLANT
WATER STERILE IRR 500ML POUR (IV SOLUTION) ×1 IMPLANT

## 2024-06-19 NOTE — Transfer of Care (Signed)
 Immediate Anesthesia Transfer of Care Note  Patient: Justin Soto  Procedure(s) Performed: REPAIR, HERNIA, UMBILICAL, ADULT (Abdomen)  Patient Location: PACU  Anesthesia Type:General  Level of Consciousness: awake  Airway & Oxygen Therapy: Patient Spontanous Breathing and Patient connected to face mask oxygen  Post-op Assessment: Report given to RN and Post -op Vital signs reviewed and stable  Post vital signs: Reviewed  Last Vitals:  Vitals Value Taken Time  BP    Temp    Pulse    Resp    SpO2      Last Pain:  Vitals:   06/19/24 0834  TempSrc: Temporal  PainSc: 0-No pain         Complications: No notable events documented.

## 2024-06-19 NOTE — Anesthesia Preprocedure Evaluation (Addendum)
 Anesthesia Evaluation  Patient identified by MRN, date of birth, ID band Patient awake    Reviewed: Allergy & Precautions, H&P , NPO status , Patient's Chart, lab work & pertinent test results  Airway Mallampati: III  TM Distance: <3 FB Neck ROM: Limited   Comment: Rheumatoid arthritis;  and has had neck surgery, limited neck movement, will get neck in comfortable position prior to induction of GETA, and use caution with neck on intubation, please plan videolaryngoscope Dental no notable dental hx.    Pulmonary neg pulmonary ROS   Pulmonary exam normal breath sounds clear to auscultation       Cardiovascular hypertension, Normal cardiovascular exam Rhythm:Regular Rate:Normal  12-25-18  WALL SEGMENT CHANGES                         Rest                Stress  Anterior Septum Basal: Normal              Hyperkinetic                    Mid: Normal              Hyperkinetic                 Apical: Normal              Hyperkinetic    Anterior Wall Basal: Normal              Hyperkinetic                    Mid: Normal              Hyperkinetic                 Apical: Normal              Hyperkinetic     Lateral Wall Basal: Normal              Hyperkinetic                    Mid: Normal              Hyperkinetic                 Apical: Normal              Hyperkinetic   Posterior Wall Basal: Normal              Hyperkinetic                    Mid: Normal              Hyperkinetic    Inferior Wall Basal: Normal              Hyperkinetic                    Mid: Normal              Hyperkinetic                 Apical: Normal              Hyperkinetic  Inferior Septum Basal: Normal              Hyperkinetic  Mid: Normal              Hyperkinetic             Resting EF: >55% (Est.)                  Stress EF: >55% (Est.)   _________________________________________________________________________________________  ADDITIONAL  FINDINGS  _________________________________________________________________________________________  STRESS ECG RESULTS            ECG Results: Normal     Neuro/Psych negative neurological ROS  negative psych ROS   GI/Hepatic negative GI ROS, Neg liver ROS,,,  Endo/Other  diabetes    Renal/GU negative Renal ROS  negative genitourinary   Musculoskeletal negative musculoskeletal ROS (+)    Abdominal   Peds negative pediatric ROS (+)  Hematology negative hematology ROS (+)   Anesthesia Other Findings Hypertension Palpitations Tick fever Neck pain of over 3 months duration BPH (benign prostatic hyperplasia) History of kidney stones Environmental and seasonal allergies Leg pain, right HOH (hard of hearing); wears bilateral hearing aids GERD (gastroesophageal reflux disease), took his med this am, denies awakening at night Thalassemia Dyspnea Arthritis  Prostate cancer (HCC) MRSA (methicillin resistant staph aureus) culture positive  Penile adhesions ADHD (attention deficit hyperactivity disorder) Inguinal hernia Umbilical hernia  Lumbar stenosis with neurogenic claudication Anxiety  DM (diabetes mellitus), type 2 (HCC) Morbid obesity (HCC) Hyperlipidemia OSA on CPAP  IDA (iron deficiency anemia) Coronary artery disease  Obesity Family history  of adverse reaction to anesthesia, mother has PONV History of prostatitis RA (rheumatoid arthritis) (HCC)  DDD (degenerative disc disease), cervical DDD (degenerative disc disease), lumbar Pneumonia  Rheumatoid arthritis, caution with intubation, please use videolaryngoscope  Reproductive/Obstetrics negative OB ROS                              Anesthesia Physical Anesthesia Plan  ASA: 3  Anesthesia Plan: General ETT   Post-op Pain Management:    Induction: Intravenous  PONV Risk Score and Plan:   Airway Management Planned: Oral ETT  Additional Equipment:   Intra-op Plan:    Post-operative Plan: Extubation in OR  Informed Consent: I have reviewed the patients History and Physical, chart, labs and discussed the procedure including the risks, benefits and alternatives for the proposed anesthesia with the patient or authorized representative who has indicated his/her understanding and acceptance.     Dental Advisory Given  Plan Discussed with: Anesthesiologist, CRNA and Surgeon  Anesthesia Plan Comments: (Patient consented for risks of anesthesia including but not limited to:  - adverse reactions to medications - damage to eyes, teeth, lips or other oral mucosa - nerve damage due to positioning  - sore throat or hoarseness - Damage to heart, brain, nerves, lungs, other parts of body or loss of life  Patient voiced understanding and assent.)         Anesthesia Quick Evaluation

## 2024-06-19 NOTE — Anesthesia Procedure Notes (Signed)
 Procedure Name: Intubation Date/Time: 06/19/2024 10:30 AM  Performed by: Kearney Rosina SAILOR, RNPre-anesthesia Checklist: Patient identified, Patient being monitored, Timeout performed, Emergency Drugs available and Suction available Patient Re-evaluated:Patient Re-evaluated prior to induction Oxygen Delivery Method: Circle system utilized Preoxygenation: Pre-oxygenation with 100% oxygen Induction Type: IV induction Ventilation: Mask ventilation without difficulty, Oral airway inserted - appropriate to patient size and Two handed mask ventilation required Laryngoscope Size: Mac and 3 Grade View: Grade I Tube type: Oral Tube size: 7.5 mm Number of attempts: 1 Airway Equipment and Method: Stylet Placement Confirmation: ETT inserted through vocal cords under direct vision, positive ETCO2 and breath sounds checked- equal and bilateral Secured at: 22 cm Tube secured with: Tape Dental Injury: Teeth and Oropharynx as per pre-operative assessment  Comments: Atraumatic itubation, BVM with 2 person due to mustache and limited OM in neck, head and neck remained neutral during intubation

## 2024-06-19 NOTE — Op Note (Signed)
 Umbilical Hernia Repair  Pre-operative Diagnosis: Umbilical hernia  Post-operative Diagnosis: same  Surgeon: Honor Leghorn, MD FACS  Anesthesia: General   Findings: 1.5 cm fascial defect diameter    Estimated Blood Loss: .5 mL                 Specimens: lipomatous tissue, discarded.         Complications: none              Procedure Details  The patient was seen again in the Holding Room. The benefits, complications, treatment options, and expected outcomes were discussed with the patient. The risks of bleeding, infection, recurrence of symptoms, failure to resolve symptoms, bowel injury, mesh placement, mesh infection, any of which could require further surgery were reviewed with the patient. The likelihood of improving the patient's symptoms with return to their baseline status is good.  The patient and/or family concurred with the proposed plan, giving informed consent.  The patient was taken to Operating Room, identified, and the procedure verified.  A Time Out was held and the above information confirmed.  Prior to the induction of general anesthesia, antibiotic prophylaxis was administered. VTE prophylaxis was in place. General endotracheal anesthesia was then administered and tolerated well. After the induction, the abdomen was prepped with Chloraprep and draped in the sterile fashion. The patient was positioned in the supine position.   Incision was created with a scalpel over the hernia defect. Electrocautery was used to dissect through subcutaneous tissue, the hernia sac was opened excised. The hernia was measured.  I closed the hernia defect with interrupted 0 Ethibond sutures.   Incision was closed in a 2 layer fashion with 3-0 Vicryl to pexy the umbilical skin and 4-0 Monocryl. Dermabond was used to coat the skin. Marcaine  quarter percent with epinephrine  and lidocaine  1% was used to inject the site. Patient tolerated procedure well and there were no immediate  complications. Needle and laparotomy counts were correct   Honor Leghorn, M.D., [facility]  06/19/2024 ; 11:14 AM

## 2024-06-19 NOTE — Interval H&P Note (Signed)
 History and Physical Interval Note:  06/19/2024 10:06 AM  Justin Soto  has presented today for surgery, with the diagnosis of umbilical hernia.  The various methods of treatment have been discussed with the patient and family. After consideration of risks, benefits and other options for treatment, the patient has consented to  Procedure(s): REPAIR, HERNIA, UMBILICAL, ADULT (N/A) as a surgical intervention.  The patient's history has been reviewed, patient examined, no change in status, stable for surgery.  I have reviewed the patient's chart and labs.  Questions were answered to the patient's satisfaction.     Honor Leghorn

## 2024-06-20 ENCOUNTER — Encounter: Payer: Self-pay | Admitting: Surgery

## 2024-06-20 NOTE — Anesthesia Postprocedure Evaluation (Signed)
 Anesthesia Post Note  Patient: Justin Soto  Procedure(s) Performed: REPAIR, HERNIA, UMBILICAL, ADULT (Abdomen)  Patient location during evaluation: PACU Anesthesia Type: General Level of consciousness: awake and alert Pain management: pain level controlled Vital Signs Assessment: post-procedure vital signs reviewed and stable Respiratory status: spontaneous breathing, nonlabored ventilation, respiratory function stable and patient connected to nasal cannula oxygen Cardiovascular status: blood pressure returned to baseline and stable Postop Assessment: no apparent nausea or vomiting Anesthetic complications: no   No notable events documented.   Last Vitals:  Vitals:   06/19/24 1145 06/19/24 1204  BP: 117/68 135/72  Pulse: 81 73  Resp: (!) 23 20  Temp: 36.7 C 36.7 C  SpO2: 93% 93%    Last Pain:  Vitals:   06/20/24 0901  TempSrc:   PainSc: 1                  Daniil Labarge C Lucindia Lemley

## 2024-07-03 NOTE — Progress Notes (Unsigned)
 [facility] POST-OP OFFICE VISIT  07/04/2024  HPI: Justin Soto is a 63 y.o. male had surgery on  June 19, 2024 , now s/p UHR.  He presents today without any complaint.  Had minimal soreness for about 2 days, stopped taking pain medication.  Denies any nausea, vomiting, fevers or chills.  Vital signs: BP 120/75   Pulse (!) 101   Temp 98.7 F (37.1 C) (Oral)   Ht 5' 6 (1.676 m)   Wt 235 lb (106.6 kg)   SpO2 93%   BMI 37.93 kg/m    Physical Exam: Constitutional: Appears well, upright and active. Abdomen: Periumbilical incision appears to be healing nicely.  Dermabond nearly gone.  A little thickening to the right side of the anticipated fascial repair, uncertain why.  Does not appear to be a fascial defect.  Perhaps some fascial redundancy.  It is clearly nontender.   Assessment/Plan: This is a 63 y.o. male status post umbilical hernia repair from June 19, 2024.  Progressing well.  Patient Active Problem List   Diagnosis Date Noted   Arthritis 04/05/2024   Acute eczematoid otitis externa of left ear 11/07/2023   Elevated liver enzymes 07/25/2023   Hyperlipidemia associated with type 2 diabetes mellitus (HCC) 07/25/2023   Acute gastritis without hemorrhage 07/25/2023   Morbid obesity (HCC) 07/25/2023   Swelling of multiple joints 07/06/2023   Morning joint stiffness of multiple sites 07/06/2023   Acute left otitis media 07/06/2023   GAD (generalized anxiety disorder) 12/27/2022   Spondylolisthesis of lumbosacral region 10/12/2022   Type 2 diabetes mellitus with complication (HCC) 10/19/2020   Postoperative wound infection 11/28/2019   Lumbar stenosis with neurogenic claudication 10/23/2019   Precordial pain 06/20/2019   Trochanteric bursitis of right hip 11/01/2018   Prostate cancer (HCC) 08/09/2018   Umbilical hernia without obstruction and without gangrene 03/06/2018   Nephrolithiasis 01/15/2017   OSA (obstructive sleep apnea) 11/08/2016   ADD (attention  deficit disorder) 09/08/2015   Absolute anemia 09/08/2015   Achilles bursitis 09/08/2015   Back pain, thoracic 09/08/2015   Acid reflux 09/08/2015   Bulge of cervical disc without myelopathy 09/08/2015   H/O 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 system disorder 09/08/2015   Hypertension associated with diabetes (HCC) 09/08/2015   Non-seasonal allergic rhinitis due to pollen 09/08/2015   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  Released to unrestricted activity after another 2 weeks.   Honor Leghorn M.D., FACS 07/04/2024, 2:43 PM

## 2024-07-04 ENCOUNTER — Encounter: Payer: Self-pay | Admitting: Surgery

## 2024-07-04 ENCOUNTER — Encounter: Admitting: Surgery

## 2024-07-04 ENCOUNTER — Ambulatory Visit (INDEPENDENT_AMBULATORY_CARE_PROVIDER_SITE_OTHER): Admitting: Surgery

## 2024-07-04 VITALS — BP 120/75 | HR 101 | Temp 98.7°F | Ht 66.0 in | Wt 235.0 lb

## 2024-07-04 DIAGNOSIS — K429 Umbilical hernia without obstruction or gangrene: Secondary | ICD-10-CM | POA: Diagnosis not present

## 2024-07-04 DIAGNOSIS — D141 Benign neoplasm of larynx: Secondary | ICD-10-CM | POA: Diagnosis not present

## 2024-07-04 DIAGNOSIS — Z09 Encounter for follow-up examination after completed treatment for conditions other than malignant neoplasm: Secondary | ICD-10-CM | POA: Insufficient documentation

## 2024-07-04 DIAGNOSIS — R1310 Dysphagia, unspecified: Secondary | ICD-10-CM | POA: Diagnosis not present

## 2024-07-04 NOTE — Patient Instructions (Signed)

## 2024-07-09 ENCOUNTER — Telehealth: Payer: Self-pay

## 2024-07-09 ENCOUNTER — Other Ambulatory Visit (HOSPITAL_COMMUNITY): Payer: Self-pay

## 2024-07-09 NOTE — Telephone Encounter (Signed)
 Pharmacy Patient Advocate Encounter   Received notification from CoverMyMeds that prior authorization for Ozempic  (0.25 or 0.5 MG/DOSE) 2MG /3ML pen-injectors is required/requested.   Insurance verification completed.   The patient is insured through [facility] .   Per test claim: Refill too soon. PA is not needed at this time. Medication was filled 06/17/2024. Next eligible fill date is 08/22/2024.

## 2024-07-12 ENCOUNTER — Other Ambulatory Visit: Payer: Self-pay

## 2024-07-12 DIAGNOSIS — C61 Malignant neoplasm of prostate: Secondary | ICD-10-CM

## 2024-07-15 ENCOUNTER — Other Ambulatory Visit

## 2024-07-15 DIAGNOSIS — C61 Malignant neoplasm of prostate: Secondary | ICD-10-CM | POA: Diagnosis not present

## 2024-07-16 LAB — PSA: Prostate Specific Ag, Serum: 3 ng/mL (ref 0.0–4.0)

## 2024-07-20 DIAGNOSIS — G4733 Obstructive sleep apnea (adult) (pediatric): Secondary | ICD-10-CM | POA: Diagnosis not present

## 2024-08-02 ENCOUNTER — Ambulatory Visit: Admitting: Urology

## 2024-08-02 ENCOUNTER — Encounter: Payer: Self-pay | Admitting: Urology

## 2024-08-02 ENCOUNTER — Ambulatory Visit (INDEPENDENT_AMBULATORY_CARE_PROVIDER_SITE_OTHER): Admitting: Urology

## 2024-08-02 VITALS — BP 144/85 | HR 58 | Ht 66.0 in | Wt 230.0 lb

## 2024-08-02 DIAGNOSIS — N475 Adhesions of prepuce and glans penis: Secondary | ICD-10-CM | POA: Diagnosis not present

## 2024-08-02 DIAGNOSIS — M542 Cervicalgia: Secondary | ICD-10-CM | POA: Diagnosis not present

## 2024-08-02 DIAGNOSIS — C61 Malignant neoplasm of prostate: Secondary | ICD-10-CM

## 2024-08-02 DIAGNOSIS — N401 Enlarged prostate with lower urinary tract symptoms: Secondary | ICD-10-CM | POA: Diagnosis not present

## 2024-08-02 DIAGNOSIS — M47812 Spondylosis without myelopathy or radiculopathy, cervical region: Secondary | ICD-10-CM | POA: Diagnosis not present

## 2024-08-02 MED ORDER — MUPIROCIN 2 % EX OINT: 1.0000 | TOPICAL_OINTMENT | Freq: Two times a day (BID) | CUTANEOUS | 1 refills | Status: AC

## 2024-08-02 NOTE — Progress Notes (Signed)
 08/02/2024 10:37 AM   Justin Soto Reels 24-Sep-1961 [id number]  Referring provider: Donzella Lauraine SAILOR, DO [address] [place],  [place] [address]  Chief Complaint  Patient presents with   Prostate Cancer   Urological history: T1c low risk prostate cancer  - PSA (07/2023) 2.0 - Biopsy 07/2018; abnormal PSA velocity (2.0 uncorrected); 48 g  - 4/12 cores; Gleason 3+3 adenocarcinoma - Elected active surveillance - MRI 08/2020; 31 g prostate; no high-grade lesions noted - Confirmatory biopsy 12/09/2020; 31 g volume -1/12 cores; Gleason 3+3 adenocarcinoma 5%   2.  BPH with LUTS - Combination therapy tamsulosin /finasteride    3.  History stone disease - Prior SWL - CT 06/2017 with 3 mm right upper pole calculus - CT 03/2022 - possible 2-3 mm left proximal ureteral stone   4.  Severe penile adhesions - Status post lysis penile adhesions/penile biopsy 04/16/2024 - Benign penile biopsy  HPI: Justin Soto is a 63 y.o. male who presents for prostate cancer follow-up  States slight raw area glans which Bactroban  has helped in the past Stable LUTS Denies dysuria, gross hematuria No flank, abdominal or pelvic pain PSA 07/15/2024 increased 3.0 (uncorrected)   PMH: Past Medical History:  Diagnosis Date   ADHD (attention deficit hyperactivity disorder)    Anxiety    Arthritis    BPH (benign prostatic hyperplasia)    Coronary artery disease    DDD (degenerative disc disease), cervical    a.) a/p ACDF C3-C7   DDD (degenerative disc disease), lumbar    a.) s/p laminectomy and fusion L3-L5; b.) s/p laminectomy L5-S1   DM (diabetes mellitus), type 2 (HCC)    Dyspnea    Environmental and seasonal allergies    Family history of adverse reaction to anesthesia    a.) PONV in 1st degree relative (mother)   GERD (gastroesophageal reflux disease)    History of kidney stones    History of prostatitis    HOH (hard of hearing); wears bilateral hearing aids    Hyperlipidemia     Hypertension    IDA (iron deficiency anemia)    Inguinal hernia    Leg pain, right    Lumbar stenosis with neurogenic claudication    Morbid obesity (HCC)    MRSA (methicillin resistant staph aureus) culture positive    Neck pain of over 3 months duration    Obesity    OSA on CPAP    Palpitations    Penile adhesions    Pneumonia    Prostate cancer (HCC)    RA (rheumatoid arthritis) (HCC)    a.) followed by rheumatology; negative rheumatoid factor; Dx based on pattern of joint involvement, gelling phenomenon, and mildly prolonged morning stiffness   Thalassemia    Tick fever    Umbilical hernia     Surgical History: Past Surgical History:  Procedure Laterality Date   ANTERIOR CERVICAL DECOMP/DISCECTOMY FUSION N/A 11/13/2014   Procedure: Cervical three-four, Cervical six-seven anterior cervical decompression with fusion interbody prosthesis plating and bonegraft;  Surgeon: Reyes Budge, MD;  Location: [facility] OR;  Service: Neurosurgery;  Laterality: N/A;  Cervical three-four, Cervical six-seven anterior cervical decompression with fusion interbody prosthesis plating and bonegraft   CALCANEAL OSTEOTOMY Right 01/11/2017   Procedure: CALCANEAL OSTEOTOMY;  Surgeon: Kayla Pinal, MD;  Location: [facility] ORS;  Service: Orthopedics;  Laterality: Right;   CIRCUMCISION N/A 12/13/2017   Procedure: PENILE BLOCK, LYSIS OF PENILE ADHESIONS;  Surgeon: Penne Knee, MD;  Location: [facility] ORS;  Service: Urology;  Laterality:  N/A;   COLONOSCOPY     COLONOSCOPY WITH PROPOFOL  N/A 09/15/2021   Procedure: COLONOSCOPY WITH PROPOFOL ;  Surgeon: Toledo, Ladell POUR, MD;  Location: [facility] ENDOSCOPY;  Service: Gastroenterology;  Laterality: N/A;  DM   CYSTOSCOPY N/A 12/13/2017   Procedure: CYSTOSCOPY;  Surgeon: Penne Knee, MD;  Location: [facility] ORS;  Service: Urology;  Laterality: N/A;   CYSTOSCOPY N/A 04/16/2024   Procedure: CYSTOSCOPY;  Surgeon: Twylla Glendia BROCKS, MD;  Location: [facility] ORS;  Service: Urology;   Laterality: N/A;   DIRECT LARYNGOSCOPY N/A 11/24/2020   Procedure: MICRO DIRECT LARYNGOSCOPY W/BIOPSY;  Surgeon: Herminio Miu, MD;  Location: [facility] ORS;  Service: ENT;  Laterality: N/A;   HYDROCELE EXCISION / REPAIR     INGUINAL HERNIA REPAIR Left    LEG SURGERY Right    Had surgery on right calf X 7   LESION DESTRUCTION N/A 04/16/2024   Procedure: DESTRUCTION, LESION, PENIS;  Surgeon: Twylla Glendia BROCKS, MD;  Location: [facility] ORS;  Service: Urology;  Laterality: N/A;   LITHOTRIPSY     LUMBAR LAMINECTOMY (L5-S1) N/A    LUMBAR LAMINECTOMY AND FUSION (L3-L5) N/A    LUMBAR WOUND DEBRIDEMENT N/A 11/28/2019   Procedure: LUMBAR WOUND DEBRIDEMENT;  Surgeon: Mavis Purchase, MD;  Location: [facility] OR;  Service: Neurosurgery;  Laterality: N/A;  LUMBAR WOUND DEBRIDEMENT   PENILE BIOPSY N/A 04/16/2024   Procedure: BIOPSY, PENIS;  Surgeon: Twylla Glendia BROCKS, MD;  Location: [facility] ORS;  Service: Urology;  Laterality: N/A;   QUADRICEPS TENDON REPAIR Left 06/22/2015   Procedure: REPAIR QUADRICEP TENDON;  Surgeon: Kayla Pinal, MD;  Location: [facility] ORS;  Service: Orthopedics;  Laterality: Left;   QUADRICEPS TENDON REPAIR Left 08/12/2015   Procedure: REPAIR QUADRICEP TENDON;  Surgeon: Kayla Pinal, MD;  Location: [facility] ORS;  Service: Orthopedics;  Laterality: Left;   ROTATOR CUFF REPAIR Left    TONSILLECTOMY     UMBILICAL HERNIA REPAIR N/A 06/19/2024   Procedure: REPAIR, HERNIA, UMBILICAL, ADULT;  Surgeon: Lane Shope, MD;  Location: [facility] ORS;  Service: General;  Laterality: N/A;   VASECTOMY      Home Medications:  Allergies as of 08/02/2024       Reactions   Tizanidine Nausea Only   Methocarbamol Other (See Comments)   Numbness, and change of taste    Vicodin [hydrocodone -acetaminophen ] Itching   Can take with benadryl          Medication List        Accurate as of August 02, 2024 10:37 AM. If you have any questions, ask your nurse or doctor.          amLODipine -benazepril  10-40 MG  capsule Commonly known as: LOTREL Take 1 capsule by mouth daily.   aspirin EC 81 MG tablet Take 81 mg by mouth in the morning.   atorvastatin  40 MG tablet Commonly known as: LIPITOR Take 1 tablet (40 mg total) by mouth daily.   betamethasone  dipropionate 0.05 % cream Apply topically 2 (two) times daily.   cetirizine  10 MG tablet Commonly known as: ZYRTEC  Take 1 tablet (10 mg total) by mouth daily. Take one tablet daily What changed:  when to take this additional instructions   cyclobenzaprine  10 MG tablet Commonly known as: FLEXERIL  Take 1 tablet (10 mg total) by mouth at bedtime.   dapagliflozin  propanediol 10 MG Tabs tablet Commonly known as: Farxiga  TAKE 1 TABLET BY MOUTH EVERY DAY BEFORE BREAKFAST   finasteride  5 MG tablet Commonly known as: PROSCAR  TAKE 1 TABLET (5 MG TOTAL) BY MOUTH DAILY.  fluconazole  100 MG tablet Commonly known as: DIFLUCAN  Take 1 tablet (100 mg total) by mouth daily. X 7 days   fluticasone  50 MCG/ACT nasal spray Commonly known as: FLONASE  USE TWO SPRAYS IN EACH NOSTRIL EVERY DAY   gabapentin  600 MG tablet Commonly known as: NEURONTIN  Take 600 mg by mouth daily as needed (back pain).   hydrochlorothiazide  25 MG tablet Commonly known as: HYDRODIURIL  Take 1 tablet (25 mg total) by mouth daily.   ibuprofen  800 MG tablet Commonly known as: ADVIL  Take 1 tablet (800 mg total) by mouth every 8 (eight) hours as needed.   meloxicam  15 MG tablet Commonly known as: MOBIC  Take 15 mg by mouth in the morning.   metoprolol  succinate 50 MG 24 hr tablet Commonly known as: TOPROL -XL Take 1 tablet (50 mg total) by mouth daily. TAKE WITH OR IMMEDIATELY FOLLOWING A MEAL.   montelukast  10 MG tablet Commonly known as: SINGULAIR  Take 1 tablet (10 mg total) by mouth at bedtime.   multivitamin with minerals Tabs tablet Take 1 tablet by mouth in the morning.   mupirocin  ointment 2 % Commonly known as: BACTROBAN  Apply 1 Application topically 2  (two) times daily.   NON FORMULARY Pt uses a cpap nightly   nystatin  ointment Commonly known as: MYCOSTATIN  Apply 1 Application topically 2 (two) times daily.   omeprazole 20 MG capsule Commonly known as: PRILOSEC Take 20 mg by mouth daily.   Ozempic  (2 MG/DOSE) 8 MG/3ML Sopn Generic drug: Semaglutide  (2 MG/DOSE) INJECT 2 MG AS DIRECTED ONCE A WEEK.   sertraline  50 MG tablet Commonly known as: ZOLOFT  TAKE 1 TABLET BY MOUTH EVERY DAY   tamsulosin  0.4 MG Caps capsule Commonly known as: FLOMAX  TAKE 2 CAPSULES BY MOUTH EVERY DAY        Allergies:  Allergies  Allergen Reactions   Tizanidine Nausea Only   Methocarbamol Other (See Comments)    Numbness, and change of taste    Vicodin [Hydrocodone -Acetaminophen ] Itching    Can take with benadryl      Family History: Family History  Problem Relation Age of Onset   Prostate cancer Father    Heart disease Father    Diabetes Father    Alzheimer's disease Father    Anemia Mother    Osteoporosis Mother    Healthy Sister    Diabetes Brother    Hypertension Brother    Anemia Daughter    Diabetes Brother    Kidney disease Neg Hx    Bladder Cancer Neg Hx    Colon cancer Neg Hx     Social History:  reports that he has never smoked. He has never been exposed to tobacco smoke. He has never used smokeless tobacco. He reports that he does not drink alcohol and does not use drugs.   Physical Exam: BP (!) 144/85   Pulse (!) 58   Ht 5' 6 (1.676 m)   Wt 230 lb (104.3 kg)   BMI 37.12 kg/m   Constitutional:  Alert, No acute distress. HEENT: [place] AT Respiratory: Normal respiratory effort, no increased work of breathing. GU: Slight adhesions to corona radiata.  Minimal erythema glans Psychiatric: Normal mood and affect.   Assessment & Plan:    1.  Prostate cancer T1c low risk PSA bump 3.0 (uncorrected) Recheck 6 months  2.  BPH with LUTS Stable on combination therapy  3.  Penile adhesions Significantly improved  after recent lysis Bactroban  refilled   Continue annual follow-up   Glendia JAYSON Barba, MD  [facility] [address], [address] [place], [place] [address] [phone]

## 2024-08-10 ENCOUNTER — Other Ambulatory Visit: Payer: Self-pay | Admitting: Family Medicine

## 2024-08-10 DIAGNOSIS — I152 Hypertension secondary to endocrine disorders: Secondary | ICD-10-CM

## 2024-08-12 NOTE — Telephone Encounter (Signed)
 Requested Prescriptions  Pending Prescriptions Disp Refills   hydrochlorothiazide  (HYDRODIURIL ) 25 MG tablet [Pharmacy Med Name: HYDROCHLOROTHIAZIDE  25 MG TAB] 90 tablet 0    Sig: TAKE 1 TABLET (25 MG TOTAL) BY MOUTH DAILY.     Cardiovascular: Diuretics - Thiazide Failed - 08/12/2024 10:09 AM      Failed - K in normal range and within 180 days    Potassium  Date Value Ref Range Status  06/19/2024 3.3 (L) 3.5 - 5.1 mmol/L Final  08/11/2014 4.1 3.5 - 5.1 mmol/L Final         Failed - Last BP in normal range    BP Readings from Last 1 Encounters:  08/02/24 (!) 144/85         Passed - Cr in normal range and within 180 days    Creatinine  Date Value Ref Range Status  08/11/2014 1.01 0.60 - 1.30 mg/dL Final   Creatinine, Ser  Date Value Ref Range Status  06/19/2024 0.80 0.61 - 1.24 mg/dL Final         Passed - Na in normal range and within 180 days    Sodium  Date Value Ref Range Status  06/19/2024 139 135 - 145 mmol/L Final  04/05/2024 138 134 - 144 mmol/L Final  08/11/2014 138 136 - 145 mmol/L Final         Passed - Valid encounter within last 6 months    Recent Outpatient Visits           4 months ago Type 2 diabetes mellitus with complication [facility])   [place] [facility] Pardue, Lauraine SAILOR, DO       Future Appointments             In 1 month Pardue, Lauraine SAILOR, DO [facility] [place]   In 11 months Stoioff, Glendia BROCKS, MD [facility]

## 2024-08-20 DIAGNOSIS — G4733 Obstructive sleep apnea (adult) (pediatric): Secondary | ICD-10-CM | POA: Diagnosis not present

## 2024-08-21 ENCOUNTER — Other Ambulatory Visit: Payer: Self-pay | Admitting: Physician Assistant

## 2024-08-21 ENCOUNTER — Other Ambulatory Visit: Payer: Self-pay | Admitting: Urology

## 2024-08-21 ENCOUNTER — Other Ambulatory Visit: Payer: Self-pay | Admitting: Surgery

## 2024-08-21 DIAGNOSIS — T8149XA Infection following a procedure, other surgical site, initial encounter: Secondary | ICD-10-CM

## 2024-08-21 DIAGNOSIS — N478 Other disorders of prepuce: Secondary | ICD-10-CM

## 2024-08-22 ENCOUNTER — Other Ambulatory Visit: Payer: Self-pay | Admitting: Neurosurgery

## 2024-08-22 DIAGNOSIS — M47812 Spondylosis without myelopathy or radiculopathy, cervical region: Secondary | ICD-10-CM

## 2024-08-25 ENCOUNTER — Ambulatory Visit
Admission: RE | Admit: 2024-08-25 | Discharge: 2024-08-25 | Disposition: A | Discharge status: Home / Self Care | Source: Ambulatory Visit | Attending: Neurosurgery | Admitting: Neurosurgery

## 2024-08-25 DIAGNOSIS — M47812 Spondylosis without myelopathy or radiculopathy, cervical region: Secondary | ICD-10-CM | POA: Insufficient documentation

## 2024-08-25 DIAGNOSIS — M5023 Other cervical disc displacement, cervicothoracic region: Secondary | ICD-10-CM | POA: Diagnosis not present

## 2024-08-25 DIAGNOSIS — R200 Anesthesia of skin: Secondary | ICD-10-CM | POA: Diagnosis not present

## 2024-08-25 DIAGNOSIS — M4802 Spinal stenosis, cervical region: Secondary | ICD-10-CM | POA: Diagnosis not present

## 2024-09-04 ENCOUNTER — Other Ambulatory Visit: Payer: Self-pay | Admitting: Family Medicine

## 2024-09-04 DIAGNOSIS — I152 Hypertension secondary to endocrine disorders: Secondary | ICD-10-CM

## 2024-09-26 ENCOUNTER — Ambulatory Visit: Admitting: Urology

## 2024-10-04 ENCOUNTER — Encounter: Payer: Self-pay | Admitting: Family Medicine

## 2024-10-04 ENCOUNTER — Ambulatory Visit (INDEPENDENT_AMBULATORY_CARE_PROVIDER_SITE_OTHER): Payer: Self-pay | Admitting: Family Medicine

## 2024-10-04 VITALS — BP 131/72 | HR 84 | Temp 98.6°F | Ht 66.0 in | Wt 218.0 lb

## 2024-10-04 DIAGNOSIS — E118 Type 2 diabetes mellitus with unspecified complications: Secondary | ICD-10-CM

## 2024-10-04 DIAGNOSIS — Z7985 Long-term (current) use of injectable non-insulin antidiabetic drugs: Secondary | ICD-10-CM

## 2024-10-04 DIAGNOSIS — E1159 Type 2 diabetes mellitus with other circulatory complications: Secondary | ICD-10-CM

## 2024-10-04 DIAGNOSIS — I152 Hypertension secondary to endocrine disorders: Secondary | ICD-10-CM

## 2024-10-04 DIAGNOSIS — K4090 Unilateral inguinal hernia, without obstruction or gangrene, not specified as recurrent: Secondary | ICD-10-CM

## 2024-10-04 NOTE — Assessment & Plan Note (Addendum)
 Chronic, stable.  Borderline today, target under 130/80 due to diabetes.  Counseled patient on lifestyle modifications.   - Continue metoprolol  succinate 50 mg daily, hydrochlorothiazide  25 mg daily and amlodipine -benazepril  10-40 mg daily.   - No changes today.

## 2024-10-04 NOTE — Assessment & Plan Note (Addendum)
 Morbid obesity with associated hypertension, diabetes and hyperlipidemia.  Chronic, improved.  BMI now 32.15 kg/m.  Lost 12 pounds since August; patient likely deriving benefit of weight loss from his increase in exercise, as well as his Ozempic  for his diabetes. - Counseled patient on ongoing lifestyle modifications

## 2024-10-04 NOTE — Patient Instructions (Signed)
 Can try azelastine nasal spray in place of the flonase 

## 2024-10-04 NOTE — Progress Notes (Signed)
 Established patient visit   Patient: Justin Soto   DOB: 1961/01/21   63 y.o. Male  MRN: [id number] Visit Date: 10/04/2024  Today's healthcare provider: LAURAINE LOISE BUOY, DO   Chief Complaint  Patient presents with   Diabetes    Patient is here for a follow up on diabetes, monitors glucose at home ranges from 80-100.  Vaccines declined   Subjective    Diabetes  Justin Soto is a 63 year old male with diabetes and hypertension who presents for follow-up on his diabetes management and medication review.  His blood sugar levels are well-controlled, consistently under 100 mg/dL, occasionally as low as 80 mg/dL. He reports using Ozempic  and increased physical activity, and has lost 12 pounds since August. He has four months of Ozempic  remaining and is exploring insurance options to continue his medication regimen. No nausea, vomiting, belly pain, or constipation related to his diabetes medication, but he experiences some diarrhea.  He is currently taking metoprolol  succinate for blood pressure management. There was confusion regarding the versions of metoprolol  he was taking, but he clarified that he is only taking the long-acting version, which he takes once per day as directed. His blood pressure readings are typically in the 130s, which he monitors regularly.  He mentions experiencing some issues with his hand, for which he had an MRI prior to neck surgery. He plans to return for further evaluation once his insurance is reinstated. He also reports having a right-sided hernia that has recently become noticeable, causing discomfort, especially when lifting. He manages it with compression shorts and a brace, and plans to seek further evaluation once his insurance is active.  He has a history of allergies and is currently taking Montelukast , Zyrtec , and Flonase  for management. He experiences pressure in his sinuses, particularly with changes in weather.  He has been taking gabapentin  for  pain management, but notes that it causes 'brain fog' and affects his thinking. He took it last night due to severe pain but generally avoids it due to these side effects.  He is retired and mentions that he retired early due to work-related stress and physical demands.       Medications: Outpatient Medications Prior to Visit  Medication Sig   amLODipine -benazepril  (LOTREL) 10-40 MG capsule Take 1 capsule by mouth daily.   aspirin EC 81 MG tablet Take 81 mg by mouth in the morning.   atorvastatin  (LIPITOR) 40 MG tablet Take 1 tablet (40 mg total) by mouth daily.   betamethasone  dipropionate 0.05 % cream APPLY TOPICALLY TWICE A DAY   cetirizine  (ZYRTEC ) 10 MG tablet Take 1 tablet (10 mg total) by mouth daily. Take one tablet daily (Patient taking differently: Take 10 mg by mouth See admin instructions. Take 1 tablet (10 mg) by mouth scheduled every morning, may take an additional dose (10 mg) in the evening, if needed for allergies.)   cyclobenzaprine  (FLEXERIL ) 10 MG tablet Take 1 tablet (10 mg total) by mouth at bedtime.   dapagliflozin  propanediol (FARXIGA ) 10 MG TABS tablet TAKE 1 TABLET BY MOUTH EVERY DAY BEFORE BREAKFAST   finasteride  (PROSCAR ) 5 MG tablet TAKE 1 TABLET (5 MG TOTAL) BY MOUTH DAILY.   fluconazole  (DIFLUCAN ) 100 MG tablet Take 1 tablet (100 mg total) by mouth daily. X 7 days   fluticasone  (FLONASE ) 50 MCG/ACT nasal spray USE TWO SPRAYS IN EACH NOSTRIL EVERY DAY   gabapentin  (NEURONTIN ) 600 MG tablet Take 600 mg by mouth daily as needed (  back pain).   hydrochlorothiazide  (HYDRODIURIL ) 25 MG tablet TAKE 1 TABLET (25 MG TOTAL) BY MOUTH DAILY.   ibuprofen  (ADVIL ) 800 MG tablet Take 1 tablet (800 mg total) by mouth every 8 (eight) hours as needed.   meloxicam  (MOBIC ) 15 MG tablet Take 15 mg by mouth in the morning.   metoprolol  succinate (TOPROL -XL) 50 MG 24 hr tablet TAKE 1 TABLET BY MOUTH DAILY. TAKE WITH OR IMMEDIATELY FOLLOWING A MEAL.   montelukast  (SINGULAIR ) 10 MG  tablet Take 1 tablet (10 mg total) by mouth at bedtime.   Multiple Vitamin (MULTIVITAMIN WITH MINERALS) TABS tablet Take 1 tablet by mouth in the morning.   mupirocin  ointment (BACTROBAN ) 2 % Apply 1 Application topically 2 (two) times daily.   NON FORMULARY Pt uses a cpap nightly   nystatin  ointment (MYCOSTATIN ) APPLY TO AFFECTED AREA TWICE A DAY   omeprazole (PRILOSEC) 20 MG capsule Take 20 mg by mouth daily.   Semaglutide , 2 MG/DOSE, (OZEMPIC , 2 MG/DOSE,) 8 MG/3ML SOPN INJECT 2 MG AS DIRECTED ONCE A WEEK.   sertraline  (ZOLOFT ) 50 MG tablet TAKE 1 TABLET BY MOUTH EVERY DAY   tamsulosin  (FLOMAX ) 0.4 MG CAPS capsule TAKE 2 CAPSULES BY MOUTH EVERY DAY   No facility-administered medications prior to visit.        Objective    BP 131/72 (BP Location: Left Arm, Patient Position: Sitting, Cuff Size: Large)   Pulse 84   Temp 98.6 F (37 C) (Oral)   Ht 5' 6 (1.676 m)   Wt 218 lb (98.9 kg)   SpO2 98%   BMI 35.19 kg/m     Physical Exam Vitals and nursing note reviewed.  Constitutional:      General: He is not in acute distress.    Appearance: Normal appearance.  HENT:     Head: Normocephalic and atraumatic.  Eyes:     General: No scleral icterus.    Conjunctiva/sclera: Conjunctivae normal.  Cardiovascular:     Rate and Rhythm: Normal rate.  Pulmonary:     Effort: Pulmonary effort is normal.  Neurological:     Mental Status: He is alert and oriented to person, place, and time. Mental status is at baseline.  Psychiatric:        Mood and Affect: Mood normal.        Behavior: Behavior normal.      Results for orders placed or performed in visit on 10/04/24  Hemoglobin A1c  Result Value Ref Range   Hgb A1c MFr Bld 5.6 4.8 - 5.6 %   Est. average glucose Bld gHb Est-mCnc 114 mg/dL    Assessment & Plan    Type 2 diabetes mellitus with complication (HCC) Assessment & Plan: Chronic, stable. Blood sugars extremely well-controlled on Ozempic . POC A1c erred - A1c in lab  ordered. - Continue Ozempic  2 mg weekly.  Orders: -     POCT glycosylated hemoglobin (Hb A1C) -     Hemoglobin A1c  Hypertension associated with diabetes (HCC) Assessment & Plan: Chronic, stable.  Borderline today, target under 130/80 due to diabetes.  Counseled patient on lifestyle modifications.   - Continue metoprolol  succinate 50 mg daily, hydrochlorothiazide  25 mg daily and amlodipine -benazepril  10-40 mg daily.   - No changes today.   Morbid obesity (HCC) Assessment & Plan: Morbid obesity with associated hypertension, diabetes and hyperlipidemia.  Chronic, improved.  BMI now 32.15 kg/m.  Lost 12 pounds since August; patient likely deriving benefit of weight loss from his increase in exercise, as well  as his Ozempic  for his diabetes. - Counseled patient on ongoing lifestyle modifications   Right inguinal hernia   Right inguinal hernia Right inguinal hernia, noted to be reducible.  Patient reports discomfort with lifting and manages his hernia through the use of compression shorts and brace. No surgical consultation at this point due to insurance issues. - Advise to avoid heavy lifting. - Instruct to schedule a surgical consultation once insurance is active.  Plan to send referral if needed.  Non-seasonal allergic rhinitis due to pollen Managed with montelukast , Zyrtec , and Flonase . Symptoms fluctuate with weather changes. - Consider switching from Flonase  to azelastine nasal spray.    Return in about 6 months (around 04/04/2025) for CPE, Chronic f/u.      I discussed the assessment and treatment plan with the patient  The patient was provided an opportunity to ask questions and all were answered. The patient agreed with the plan and demonstrated an understanding of the instructions.   The patient was advised to call back or seek an in-person evaluation if the symptoms worsen or if the condition fails to improve as anticipated.    LAURAINE LOISE BUOY, DO  [facility]  [facility] [phone] (phone) [phone] (fax)  [facility]

## 2024-10-04 NOTE — Assessment & Plan Note (Addendum)
 Chronic, stable. Blood sugars extremely well-controlled on Ozempic . POC A1c erred - A1c in lab ordered. - Continue Ozempic  2 mg weekly.

## 2024-10-05 LAB — HEMOGLOBIN A1C
Est. average glucose Bld gHb Est-mCnc: 114 mg/dL
Hgb A1c MFr Bld: 5.6 % (ref 4.8–5.6)

## 2024-10-08 ENCOUNTER — Ambulatory Visit: Payer: Self-pay | Admitting: Family Medicine

## 2024-11-08 ENCOUNTER — Other Ambulatory Visit: Payer: Self-pay | Admitting: Family Medicine

## 2024-11-08 DIAGNOSIS — I152 Hypertension secondary to endocrine disorders: Secondary | ICD-10-CM

## 2024-11-27 ENCOUNTER — Other Ambulatory Visit: Payer: Self-pay | Admitting: Family Medicine

## 2024-11-27 DIAGNOSIS — E118 Type 2 diabetes mellitus with unspecified complications: Secondary | ICD-10-CM

## 2024-12-03 ENCOUNTER — Other Ambulatory Visit: Payer: Self-pay | Admitting: Family Medicine

## 2024-12-03 DIAGNOSIS — J301 Allergic rhinitis due to pollen: Secondary | ICD-10-CM

## 2024-12-12 ENCOUNTER — Other Ambulatory Visit: Payer: Self-pay | Admitting: Family Medicine

## 2024-12-12 DIAGNOSIS — F411 Generalized anxiety disorder: Secondary | ICD-10-CM

## 2024-12-17 ENCOUNTER — Telehealth: Payer: Self-pay | Admitting: Pharmacy Technician

## 2024-12-17 ENCOUNTER — Other Ambulatory Visit (HOSPITAL_COMMUNITY): Payer: Self-pay

## 2024-12-17 NOTE — Telephone Encounter (Signed)
 Pharmacy Patient Advocate Encounter   Received notification from [employer] that prior authorization for Ozempic  (2 MG/DOSE) 8MG /3ML pen-injectors is required/requested.   Insurance verification completed.   The patient is insured through [employer].   Per test claim: PA required; PA submitted to above mentioned insurance via Latent Key/confirmation #/EOC [id number] Status is pending

## 2024-12-19 ENCOUNTER — Other Ambulatory Visit (HOSPITAL_COMMUNITY): Payer: Self-pay

## 2024-12-19 NOTE — Telephone Encounter (Signed)
 Pharmacy Patient Advocate Encounter  Received notification from [employer] that Prior Authorization for Ozempic  (2 MG/DOSE) 8MG /3ML pen-injectors has been APPROVED from 12/17/2024 to 12/19/2025. Ran test claim, Copay is $0.00. This test claim was processed through [facility]- copay amounts may vary at other pharmacies due to pharmacy/plan contracts, or as the patient moves through the different stages of their insurance plan.   PA #/Case ID/Reference #: [id number]

## 2024-12-31 ENCOUNTER — Telehealth: Payer: Self-pay | Admitting: Family Medicine

## 2024-12-31 DIAGNOSIS — E118 Type 2 diabetes mellitus with unspecified complications: Secondary | ICD-10-CM

## 2024-12-31 NOTE — Telephone Encounter (Signed)
 Farxiga  is not covered by his insurance for 2026.  Its is over $1000.00   He is asking for another medication to be sent in.    [facility]

## 2025-01-02 MED ORDER — EMPAGLIFLOZIN 10 MG PO TABS: 10.0000 mg | ORAL_TABLET | Freq: Every day | ORAL | 0 refills | Status: AC

## 2025-01-02 NOTE — Addendum Note (Signed)
 Addended by: DONZELLA DOMINO on: 01/02/2025 03:26 PM   Modules accepted: Orders

## 2025-01-15 ENCOUNTER — Other Ambulatory Visit: Payer: Self-pay

## 2025-01-15 DIAGNOSIS — R202 Paresthesia of skin: Secondary | ICD-10-CM

## 2025-01-16 ENCOUNTER — Encounter: Payer: Self-pay | Admitting: Neurology

## 2025-02-03 ENCOUNTER — Other Ambulatory Visit

## 2025-03-20 ENCOUNTER — Encounter: Payer: Self-pay | Admitting: Neurology

## 2025-03-28 ENCOUNTER — Encounter: Payer: Self-pay | Admitting: Family Medicine

## 2025-04-04 ENCOUNTER — Encounter: Payer: Self-pay | Admitting: Family Medicine

## 2025-05-12 ENCOUNTER — Other Ambulatory Visit

## 2025-08-01 ENCOUNTER — Other Ambulatory Visit

## 2025-08-05 ENCOUNTER — Ambulatory Visit: Admitting: Urology
